# Patient Record
Sex: Female | Born: 1950
Health system: Southern US, Community
[De-identification: ages and names within clinical notes are randomized; demographics above are authoritative.]

## PROBLEM LIST (undated history)

## (undated) DIAGNOSIS — N289 Disorder of kidney and ureter, unspecified: Secondary | ICD-10-CM

## (undated) DIAGNOSIS — I2699 Other pulmonary embolism without acute cor pulmonale: Secondary | ICD-10-CM

## (undated) DIAGNOSIS — R7301 Impaired fasting glucose: Secondary | ICD-10-CM

## (undated) DIAGNOSIS — H269 Unspecified cataract: Secondary | ICD-10-CM

## (undated) DIAGNOSIS — M1990 Unspecified osteoarthritis, unspecified site: Secondary | ICD-10-CM

## (undated) DIAGNOSIS — E039 Hypothyroidism, unspecified: Secondary | ICD-10-CM

## (undated) DIAGNOSIS — I82409 Acute embolism and thrombosis of unspecified deep veins of unspecified lower extremity: Secondary | ICD-10-CM

## (undated) DIAGNOSIS — F419 Anxiety disorder, unspecified: Secondary | ICD-10-CM

## (undated) DIAGNOSIS — C099 Malignant neoplasm of tonsil, unspecified: Secondary | ICD-10-CM

## (undated) DIAGNOSIS — M7120 Synovial cyst of popliteal space [Baker], unspecified knee: Secondary | ICD-10-CM

## (undated) HISTORY — DX: Unspecified cataract: H26.9

## (undated) HISTORY — DX: Hypothyroidism, unspecified: E03.9

## (undated) HISTORY — PX: JOINT REPLACEMENT: SHX530

## (undated) HISTORY — DX: Impaired fasting glucose: R73.01

## (undated) HISTORY — PX: PULMONARY EMBOLISM SURGERY: SHX752

## (undated) HISTORY — PX: TOTAL ABDOMINAL HYSTERECTOMY: SHX209

## (undated) HISTORY — DX: Anxiety disorder, unspecified: F41.9

## (undated) HISTORY — DX: Synovial cyst of popliteal space (Baker), unspecified knee: M71.20

---

## 1996-11-11 DIAGNOSIS — C099 Malignant neoplasm of tonsil, unspecified: Secondary | ICD-10-CM

## 1996-11-11 HISTORY — DX: Malignant neoplasm of tonsil, unspecified: C09.9

## 1996-11-11 HISTORY — PX: TONSILLECTOMY AND ADENOIDECTOMY: SUR1326

## 1996-11-11 HISTORY — PX: LYMPHADENECTOMY: SHX15

## 1998-08-08 ENCOUNTER — Ambulatory Visit (HOSPITAL_COMMUNITY): Admission: RE | Admit: 1998-08-08 | Discharge: 1998-08-08 | Payer: Self-pay | Admitting: Obstetrics & Gynecology

## 1998-08-15 ENCOUNTER — Other Ambulatory Visit: Admission: RE | Admit: 1998-08-15 | Discharge: 1998-08-15 | Payer: Self-pay | Admitting: Obstetrics & Gynecology

## 1999-03-15 ENCOUNTER — Inpatient Hospital Stay (HOSPITAL_COMMUNITY): Admission: RE | Admit: 1999-03-15 | Discharge: 1999-03-17 | Payer: Self-pay | Admitting: Obstetrics & Gynecology

## 1999-08-13 ENCOUNTER — Ambulatory Visit (HOSPITAL_COMMUNITY): Admission: RE | Admit: 1999-08-13 | Discharge: 1999-08-13 | Payer: Self-pay | Admitting: Obstetrics & Gynecology

## 1999-08-13 ENCOUNTER — Encounter: Payer: Self-pay | Admitting: Obstetrics & Gynecology

## 1999-08-30 ENCOUNTER — Other Ambulatory Visit: Admission: RE | Admit: 1999-08-30 | Discharge: 1999-08-30 | Payer: Self-pay | Admitting: Obstetrics & Gynecology

## 1999-09-20 ENCOUNTER — Ambulatory Visit (HOSPITAL_COMMUNITY): Admission: RE | Admit: 1999-09-20 | Discharge: 1999-09-20 | Payer: Self-pay | Admitting: Family Medicine

## 1999-12-03 ENCOUNTER — Encounter: Payer: Self-pay | Admitting: *Deleted

## 1999-12-03 ENCOUNTER — Ambulatory Visit (HOSPITAL_COMMUNITY): Admission: RE | Admit: 1999-12-03 | Discharge: 1999-12-03 | Payer: Self-pay | Admitting: *Deleted

## 2000-08-19 ENCOUNTER — Encounter: Payer: Self-pay | Admitting: Obstetrics & Gynecology

## 2000-08-19 ENCOUNTER — Ambulatory Visit (HOSPITAL_COMMUNITY): Admission: RE | Admit: 2000-08-19 | Discharge: 2000-08-19 | Payer: Self-pay | Admitting: Obstetrics & Gynecology

## 2000-10-08 ENCOUNTER — Other Ambulatory Visit: Admission: RE | Admit: 2000-10-08 | Discharge: 2000-10-08 | Payer: Self-pay | Admitting: Obstetrics & Gynecology

## 2001-10-23 ENCOUNTER — Encounter: Payer: Self-pay | Admitting: Obstetrics & Gynecology

## 2001-10-23 ENCOUNTER — Ambulatory Visit (HOSPITAL_COMMUNITY): Admission: RE | Admit: 2001-10-23 | Discharge: 2001-10-23 | Payer: Self-pay | Admitting: Obstetrics & Gynecology

## 2001-11-11 HISTORY — PX: COLONOSCOPY: SHX174

## 2001-11-19 ENCOUNTER — Other Ambulatory Visit: Admission: RE | Admit: 2001-11-19 | Discharge: 2001-11-19 | Payer: Self-pay | Admitting: Obstetrics & Gynecology

## 2002-03-16 ENCOUNTER — Other Ambulatory Visit: Admission: RE | Admit: 2002-03-16 | Discharge: 2002-03-16 | Payer: Self-pay | Admitting: Obstetrics & Gynecology

## 2002-10-06 ENCOUNTER — Other Ambulatory Visit: Admission: RE | Admit: 2002-10-06 | Discharge: 2002-10-06 | Payer: Self-pay | Admitting: Obstetrics & Gynecology

## 2002-11-30 ENCOUNTER — Encounter: Payer: Self-pay | Admitting: Obstetrics & Gynecology

## 2002-11-30 ENCOUNTER — Ambulatory Visit (HOSPITAL_COMMUNITY): Admission: RE | Admit: 2002-11-30 | Discharge: 2002-11-30 | Payer: Self-pay | Admitting: Obstetrics & Gynecology

## 2003-02-03 ENCOUNTER — Encounter: Payer: Self-pay | Admitting: Emergency Medicine

## 2003-02-03 ENCOUNTER — Inpatient Hospital Stay (HOSPITAL_COMMUNITY): Admission: EM | Admit: 2003-02-03 | Discharge: 2003-02-04 | Payer: Self-pay | Admitting: Emergency Medicine

## 2003-02-04 ENCOUNTER — Encounter: Payer: Self-pay | Admitting: Cardiology

## 2003-10-21 ENCOUNTER — Other Ambulatory Visit: Admission: RE | Admit: 2003-10-21 | Discharge: 2003-10-21 | Payer: Self-pay | Admitting: Obstetrics & Gynecology

## 2003-12-05 ENCOUNTER — Ambulatory Visit (HOSPITAL_COMMUNITY): Admission: RE | Admit: 2003-12-05 | Discharge: 2003-12-05 | Payer: Self-pay | Admitting: Obstetrics & Gynecology

## 2004-01-18 ENCOUNTER — Ambulatory Visit (HOSPITAL_COMMUNITY): Admission: RE | Admit: 2004-01-18 | Discharge: 2004-01-18 | Payer: Self-pay | Admitting: Family Medicine

## 2004-11-21 ENCOUNTER — Other Ambulatory Visit: Admission: RE | Admit: 2004-11-21 | Discharge: 2004-11-21 | Payer: Self-pay | Admitting: Obstetrics & Gynecology

## 2004-12-21 ENCOUNTER — Ambulatory Visit (HOSPITAL_COMMUNITY): Admission: RE | Admit: 2004-12-21 | Discharge: 2004-12-21 | Payer: Self-pay | Admitting: Obstetrics & Gynecology

## 2005-01-10 ENCOUNTER — Ambulatory Visit: Payer: Self-pay | Admitting: Internal Medicine

## 2005-01-23 ENCOUNTER — Ambulatory Visit: Payer: Self-pay | Admitting: Internal Medicine

## 2005-11-11 HISTORY — PX: TOTAL HIP ARTHROPLASTY: SHX124

## 2005-12-23 ENCOUNTER — Ambulatory Visit (HOSPITAL_COMMUNITY): Admission: RE | Admit: 2005-12-23 | Discharge: 2005-12-23 | Payer: Self-pay | Admitting: Obstetrics & Gynecology

## 2005-12-25 ENCOUNTER — Other Ambulatory Visit: Admission: RE | Admit: 2005-12-25 | Discharge: 2005-12-25 | Payer: Self-pay | Admitting: Obstetrics & Gynecology

## 2006-07-21 ENCOUNTER — Encounter: Admission: RE | Admit: 2006-07-21 | Discharge: 2006-07-21 | Payer: Self-pay | Admitting: Family Medicine

## 2006-09-05 ENCOUNTER — Encounter: Admission: RE | Admit: 2006-09-05 | Discharge: 2006-09-05 | Payer: Self-pay | Admitting: Otolaryngology

## 2007-01-19 ENCOUNTER — Encounter: Admission: RE | Admit: 2007-01-19 | Discharge: 2007-01-19 | Payer: Self-pay | Admitting: Obstetrics & Gynecology

## 2007-01-20 ENCOUNTER — Ambulatory Visit: Payer: Self-pay | Admitting: Internal Medicine

## 2007-01-21 ENCOUNTER — Ambulatory Visit: Payer: Self-pay | Admitting: Internal Medicine

## 2007-02-02 ENCOUNTER — Ambulatory Visit: Payer: Self-pay

## 2007-02-23 ENCOUNTER — Inpatient Hospital Stay (HOSPITAL_COMMUNITY): Admission: RE | Admit: 2007-02-23 | Discharge: 2007-02-26 | Payer: Self-pay | Admitting: Orthopedic Surgery

## 2007-04-10 ENCOUNTER — Ambulatory Visit: Payer: Self-pay | Admitting: Internal Medicine

## 2007-06-03 ENCOUNTER — Encounter: Payer: Self-pay | Admitting: Internal Medicine

## 2007-06-03 ENCOUNTER — Inpatient Hospital Stay (HOSPITAL_COMMUNITY): Admission: RE | Admit: 2007-06-03 | Discharge: 2007-06-06 | Payer: Self-pay | Admitting: Orthopedic Surgery

## 2007-07-10 ENCOUNTER — Ambulatory Visit: Payer: Self-pay | Admitting: Internal Medicine

## 2007-07-13 LAB — CONVERTED CEMR LAB: TSH: 2.97 microintl units/mL (ref 0.35–5.50)

## 2007-07-14 ENCOUNTER — Encounter (INDEPENDENT_AMBULATORY_CARE_PROVIDER_SITE_OTHER): Payer: Self-pay | Admitting: *Deleted

## 2007-07-17 ENCOUNTER — Ambulatory Visit: Payer: Self-pay | Admitting: Internal Medicine

## 2007-07-17 DIAGNOSIS — E039 Hypothyroidism, unspecified: Secondary | ICD-10-CM | POA: Insufficient documentation

## 2007-07-29 ENCOUNTER — Encounter: Payer: Self-pay | Admitting: Internal Medicine

## 2007-08-05 ENCOUNTER — Encounter (INDEPENDENT_AMBULATORY_CARE_PROVIDER_SITE_OTHER): Payer: Self-pay | Admitting: *Deleted

## 2007-12-31 ENCOUNTER — Ambulatory Visit (HOSPITAL_COMMUNITY): Admission: RE | Admit: 2007-12-31 | Discharge: 2007-12-31 | Payer: Self-pay | Admitting: Obstetrics & Gynecology

## 2008-12-15 ENCOUNTER — Telehealth (INDEPENDENT_AMBULATORY_CARE_PROVIDER_SITE_OTHER): Payer: Self-pay | Admitting: *Deleted

## 2008-12-16 ENCOUNTER — Telehealth (INDEPENDENT_AMBULATORY_CARE_PROVIDER_SITE_OTHER): Payer: Self-pay | Admitting: *Deleted

## 2008-12-19 ENCOUNTER — Ambulatory Visit: Payer: Self-pay | Admitting: Internal Medicine

## 2008-12-24 LAB — CONVERTED CEMR LAB
ALT: 14 units/L (ref 0–35)
AST: 22 units/L (ref 0–37)
Alkaline Phosphatase: 62 units/L (ref 39–117)
Basophils Absolute: 0 10*3/uL (ref 0.0–0.1)
Bilirubin, Direct: 0.1 mg/dL (ref 0.0–0.3)
CO2: 31 meq/L (ref 19–32)
Chloride: 95 meq/L — ABNORMAL LOW (ref 96–112)
Cholesterol: 226 mg/dL (ref 0–200)
Glucose, Bld: 80 mg/dL (ref 70–99)
HDL: 85.1 mg/dL (ref >39.0)
Hemoglobin: 13.2 g/dL (ref 12.0–15.0)
Lymphocytes Relative: 22.8 % (ref 12.0–46.0)
MCHC: 34 g/dL (ref 30.0–36.0)
Monocytes Relative: 5.9 % (ref 3.0–12.0)
Neutrophils Relative %: 67.4 % (ref 43.0–77.0)
RBC: 4.27 M/uL (ref 3.87–5.11)
RDW: 12.6 % (ref 11.5–14.6)
Sodium: 136 meq/L (ref 135–145)
Total Bilirubin: 0.9 mg/dL (ref 0.3–1.2)
Total CHOL/HDL Ratio: 2.7
Total Protein: 6.9 g/dL (ref 6.0–8.3)
VLDL: 19 mg/dL (ref 0–40)

## 2008-12-26 ENCOUNTER — Telehealth (INDEPENDENT_AMBULATORY_CARE_PROVIDER_SITE_OTHER): Payer: Self-pay | Admitting: *Deleted

## 2008-12-27 ENCOUNTER — Ambulatory Visit: Payer: Self-pay | Admitting: Internal Medicine

## 2008-12-27 DIAGNOSIS — C090 Malignant neoplasm of tonsillar fossa: Secondary | ICD-10-CM | POA: Insufficient documentation

## 2008-12-27 LAB — CONVERTED CEMR LAB: OCCULT 3: NEGATIVE

## 2008-12-28 ENCOUNTER — Encounter: Payer: Self-pay | Admitting: Internal Medicine

## 2009-02-06 ENCOUNTER — Telehealth (INDEPENDENT_AMBULATORY_CARE_PROVIDER_SITE_OTHER): Payer: Self-pay | Admitting: *Deleted

## 2009-03-21 ENCOUNTER — Ambulatory Visit: Payer: Self-pay | Admitting: Internal Medicine

## 2009-03-22 LAB — CONVERTED CEMR LAB
Rhuematoid fact SerPl-aCnc: <20 intl units/mL (ref 0.0–20.0)
Sed Rate: 20 mm/hr (ref 0–22)
TSH: 2.16 microintl units/mL (ref 0.35–5.50)
Uric Acid, Serum: 5.2 mg/dL (ref 2.4–7.0)

## 2009-03-23 ENCOUNTER — Encounter (INDEPENDENT_AMBULATORY_CARE_PROVIDER_SITE_OTHER): Payer: Self-pay | Admitting: *Deleted

## 2009-09-25 ENCOUNTER — Telehealth (INDEPENDENT_AMBULATORY_CARE_PROVIDER_SITE_OTHER): Payer: Self-pay | Admitting: *Deleted

## 2009-12-12 ENCOUNTER — Telehealth (INDEPENDENT_AMBULATORY_CARE_PROVIDER_SITE_OTHER): Payer: Self-pay | Admitting: *Deleted

## 2009-12-22 ENCOUNTER — Telehealth (INDEPENDENT_AMBULATORY_CARE_PROVIDER_SITE_OTHER): Payer: Self-pay | Admitting: *Deleted

## 2009-12-25 ENCOUNTER — Ambulatory Visit: Payer: Self-pay | Admitting: Internal Medicine

## 2010-01-01 ENCOUNTER — Ambulatory Visit: Payer: Self-pay | Admitting: Internal Medicine

## 2010-01-01 DIAGNOSIS — I447 Left bundle-branch block, unspecified: Secondary | ICD-10-CM | POA: Insufficient documentation

## 2010-01-01 DIAGNOSIS — E785 Hyperlipidemia, unspecified: Secondary | ICD-10-CM

## 2010-01-01 DIAGNOSIS — E782 Mixed hyperlipidemia: Secondary | ICD-10-CM | POA: Insufficient documentation

## 2010-01-01 DIAGNOSIS — M1990 Unspecified osteoarthritis, unspecified site: Secondary | ICD-10-CM | POA: Insufficient documentation

## 2010-01-01 LAB — CONVERTED CEMR LAB
AST: 17 units/L (ref 0–37)
Alkaline Phosphatase: 66 units/L (ref 39–117)
BUN: 15 mg/dL (ref 6–23)
Basophils Absolute: 0 10*3/uL (ref 0.0–0.1)
Bilirubin, Direct: 0.1 mg/dL (ref 0.0–0.3)
Cholesterol: 216 mg/dL — ABNORMAL HIGH (ref 0–200)
Creatinine, Ser: 1.1 mg/dL (ref 0.4–1.2)
Direct LDL: 123.7 mg/dL
Eosinophils Absolute: 0.2 10*3/uL (ref 0.0–0.7)
GFR calc non Af Amer: 54.31 mL/min (ref >60)
Hemoglobin: 13.2 g/dL (ref 12.0–15.0)
Lymphocytes Relative: 27.3 % (ref 12.0–46.0)
Lymphs Abs: 1.2 10*3/uL (ref 0.7–4.0)
MCHC: 33.3 g/dL (ref 30.0–36.0)
Neutro Abs: 2.6 10*3/uL (ref 1.4–7.7)
Platelets: 243 10*3/uL (ref 150.0–400.0)
Potassium: 4.1 meq/L (ref 3.5–5.1)
RDW: 12.9 % (ref 11.5–14.6)
TSH: 4.87 microintl units/mL (ref 0.35–5.50)
Total Protein: 6.5 g/dL (ref 6.0–8.3)
VLDL: 23.4 mg/dL (ref 0.0–40.0)

## 2010-01-15 ENCOUNTER — Ambulatory Visit (HOSPITAL_COMMUNITY): Admission: RE | Admit: 2010-01-15 | Discharge: 2010-01-15 | Payer: Self-pay | Admitting: Obstetrics & Gynecology

## 2010-06-04 ENCOUNTER — Ambulatory Visit: Payer: Self-pay | Admitting: Internal Medicine

## 2010-06-04 DIAGNOSIS — M7120 Synovial cyst of popliteal space [Baker], unspecified knee: Secondary | ICD-10-CM | POA: Insufficient documentation

## 2010-12-01 ENCOUNTER — Encounter: Payer: Self-pay | Admitting: Obstetrics & Gynecology

## 2010-12-02 ENCOUNTER — Encounter: Payer: Self-pay | Admitting: Obstetrics & Gynecology

## 2010-12-09 LAB — CONVERTED CEMR LAB
Free T4: 0.5 ng/dL — ABNORMAL LOW (ref 0.6–1.6)
T3, Free: 2.5 pg/mL (ref 2.3–4.2)

## 2010-12-13 NOTE — Assessment & Plan Note (Signed)
Summary: lump behind knee/cbs   Vital Signs:  Patient profile:   60 year old female Weight:      150.6 pounds Temp:     98.5 degrees F oral Pulse rate:   60 / minute Resp:     15 per minute BP sitting:   104 / 68  (left arm) Cuff size:   large  Vitals Entered By: Shonna Chock CMA (June 04, 2010 1:58 PM) CC: 1.) Lump behind left knee, patient with history of blood clot  2.) TSH check, Lower Extremity Joint pain   CC:  1.) Lump behind left knee, patient with history of blood clot  2.) TSH check, and Lower Extremity Joint pain.  History of Present Illness: Knot behind L knee noted 07/24 /2011 as "tightness".  The patient denies swelling, redness, giving away, locking, popping, stiffness for >1 hr, decreased ROM, or  weakness.  The pain is located in the L posterior knee.  The pain began gradually and with no injury. No evaluation to date. The patient denies the following symptoms: fever, rash, photosensitivity, eye symptoms, diarrhea, and dysuria.  PMH of DVT.  Allergies (verified): No Known Drug Allergies  Physical Exam  General:  well-nourished,in no acute distress; alert,appropriate and cooperative throughout examination Pulses:  R and L dorsalis pedis and posterior tibial pulses are full and equal bilaterally Extremities:  No clubbing, cyanosis, edema, or deformity noted with normal full range of motion of all joints. L Popliteal cyst  . Negative Homan's Neurologic:  strength normal in all extremities and DTRs symmetrical and normal.   Skin:  Intact without suspicious lesions or rashes   Impression & Recommendations:  Problem # 1:  POPLITEAL CYST, LEFT (ICD-727.51)  Complete Medication List: 1)  Levothyroxine Sodium 50 Mcg Tabs (Levothyroxine sodium) .[name].. 1& 1/2  m,w ,f & sun ; 1 pill t,th , & sat 2)  Estrace 2 Mg Tabs (Estradiol) .[name].. 1 by mouth once daily 3)  Effexor Xr 75 Mg Xr24h-cap (Venlafaxine hcl) .[name].. 1 by mouth once daily 4)  Pilocarpine Hcl 5 Mg Tabs (Pilocarpine  hcl) .[name].. 1 by mouth 4 x once daily as needed 5)  Vitamin D 2000 Unit Tabs (Cholecalciferol) .[name].. 1 by mouth once daily 6)  Tretinoin 0.05 % Crea (Tretinoin) .... As directed 7)  Celebrex 200 Mg Caps (Celecoxib) .[name].. 1 two times a day as needed for apin & swelling  Patient Instructions: 1)  Go to [facility]  for Popliteal  (Baker's ) Cyst. Prescriptions: CELEBREX 200 MG CAPS (CELECOXIB) 1 two times a day as needed for apin & swelling  #15 x 0   Entered and Authorized by:   Marga Melnick MD   Signed by:   Marga Melnick MD on 06/04/2010   Method used:   Samples Given   RxID:   [id number]   Appended Document: lump behind knee/cbs   Appended Document: lump behind knee/cbs

## 2010-12-13 NOTE — Progress Notes (Signed)
Summary: cpx labs  Phone Note Call from Patient   Caller: Patient Summary of Call: Patient has a cpx on 2/21 and labs on the 14th. Need cpx lab orders please. Initial call taken by: Barb Merino,  December 12, 2009 10:35 AM  Follow-up for Phone Call        lipid,hep,cbcd,tsh,bmp,stool cards & U/A if not done by GYN v70.0/244.9 Follow-up by: Shonna Chock,  December 12, 2009 2:39 PM    Additional Follow-up for Phone Call Additional follow up Details #2::    labs are scheduled.Marland KitchenMarland KitchenMarland KitchenBarb Merino  December 13, 2009 10:39 AM  Follow-up by: Barb Merino,  December 13, 2009 10:39 AM

## 2010-12-13 NOTE — Progress Notes (Signed)
Summary: -Need Verification  Phone Note Call from Patient Call back at Home Phone [phone]   Caller: Patient Call For: Marga Melnick MD Summary of Call: Patient is scheduled for bloodwork on Monday and needs to know if she needs to take her Thyroid medication that morning. Initial call taken by: Barnie Mort,  December 22, 2009 2:28 PM  Follow-up for Phone Call        left message to call office. ...............Marland KitchenFelecia Deloach CMA  December 22, 2009 2:42 PM   pt can take all meds and have only black coffee and water for fasting appt..............Marland KitchenFelecia Deloach CMA  December 22, 2009 2:42 PM    pt aware....................Marland KitchenFelecia Deloach CMA  December 22, 2009 4:22 PM

## 2010-12-13 NOTE — Assessment & Plan Note (Signed)
Summary: yearly//fd   Vital Signs:  Patient profile:   60 year old female Height:      69.75 inches Weight:      154 pounds BMI:     22.34 Temp:     98.0 degrees F oral Pulse rate:   79 / minute Resp:     14 per minute BP sitting:   132 / 85  (left arm) Cuff size:   large  Vitals Entered By: Shonna Chock (January 01, 2010 11:32 AM)  Comments REVIEWED MED LIST, PATIENT AGREED DOSE AND INSTRUCTION CORRECT    History of Present Illness: Danielle Pratt is here for a physical; she is asymptomatic.  Allergies (verified): No Known Drug Allergies  Past History:  Past Medical History: tonsillar malignancy  1998 S/P irradiation, Dr Arletha Grippe Hypothyroidism ; LDL 113 with 1189 total particles & 0 small dense particles (LDL goal = < 130) Osteoarthritis  Past Surgical History: Hysterectomy & BSO for dysmenorrhea & fibroids  2000, Dr Konrad Dolores  C-section with twins ; G1 P2 One tonsil removed 1997 bilateral hip replacement 2008, Dr Despina Hick Colonoscopy negative  2006, due 2016  Family History: Mother both hips replaced, ovarian CA PGM - leukemia; PGF lung CA Father - MI @ 77,S/P stent brother and sister - celiac disease  Social History: Drinks socially  Former Smoker: quit age 25 Alcohol use-yes Regular exercise-no  Review of Systems  The patient denies anorexia, fever, vision loss, decreased hearing, hoarseness, chest pain, syncope, dyspnea on exertion, peripheral edema, prolonged cough, headaches, hemoptysis, abdominal pain, melena, hematochezia, severe indigestion/heartburn, hematuria, suspicious skin lesions, depression, unusual weight change, abnormal bleeding, enlarged lymph nodes, and angioedema.         Minimal weight gain. No cold or cold intolerance. No change in hair, skin or nails.  Physical Exam  General:  Thin but well-nourished; alert,appropriate and cooperative throughout examination Head:  Normocephalic and atraumatic without obvious abnormalities. No  thinning  of hair Eyes:  No corneal or conjunctival inflammation noted. Perrla. Funduscopic exam benign, without hemorrhages, exudates or papilledema.  Ears:  External ear exam shows no significant lesions or deformities.  Otoscopic examination reveals clear canals, tympanic membranes are intact bilaterally without bulging, retraction, inflammation or discharge. Hearing is grossly normal bilaterally. Nose:  External nasal examination shows no deformity or inflammation. Nasal mucosa are pink and moist without lesions or exudates. Mouth:  Oral mucosa and oropharynx without lesions or exudates.  Teeth in good repair. L post pharynx is lower than R; no tissue changes Neck:  No deformities, masses, or tenderness noted. Lungs:  Normal respiratory effort, chest expands symmetrically. Lungs are clear to auscultation, no crackles or wheezes. Heart:  Normal rate and regular rhythm. S1 and S2 normal without gallop, murmur, click, rub. S4 with slurring Abdomen:  Bowel sounds positive,abdomen soft and non-tender without masses, organomegaly or hernias noted. Msk:  No deformity or scoliosis noted of thoracic or lumbar spine.   Pulses:  R and L carotid,radial,dorsalis pedis and posterior tibial pulses are full and equal bilaterally Extremities:  No clubbing, cyanosis, edema. Minor OA DIP changes. Minor crepitus of knees Neurologic:  alert & oriented X3 and DTRs  WNL but 0-1/2 + R knee Skin:  Intact without suspicious lesions or rashes Cervical Nodes:  No lymphadenopathy noted Axillary Nodes:  No palpable lymphadenopathy Psych:  memory intact for recent and remote, normally interactive, and good eye contact.     Impression & Recommendations:  Problem # 1:  ROUTINE GENERAL MEDICAL EXAM@HEALTH   CARE FACL (ICD-V70.0)  Orders: EKG w/ Interpretation (93000)  Problem # 2:  HYPERLIPIDEMIA (ICD-272.4) LDL @ goal; HDL protective  Problem # 3:  LEFT BUNDLE BRANCH BLOCK (ICD-426.3)  Stable (see 01/20/2007)  Orders: EKG  w/ Interpretation (93000)  Problem # 4:  HYPOTHYROIDISM (ICD-244.9)  Her updated medication list for this problem includes:    Levothyroxine Sodium 50 Mcg Tabs (Levothyroxine sodium) .[name]... 1& 1/2  m,w ,f & sun ; 1 pill t,th , & sat  Problem # 5:  OSTEOARTHRITIS (ICD-715.90)  Complete Medication List: 1)  Levothyroxine Sodium 50 Mcg Tabs (Levothyroxine sodium) .[name].. 1& 1/2  m,w ,f & sun ; 1 pill t,th , & sat 2)  Estrace 2 Mg Tabs (Estradiol) .[name].. 1 by mouth once daily 3)  Effexor Xr 75 Mg Xr24h-cap (Venlafaxine hcl) .[name].. 1 by mouth once daily 4)  Pilocarpine Hcl 5 Mg Tabs (Pilocarpine hcl) .[name].. 1 by mouth 4 x once daily as needed 5)  Vitamin D 2000 Unit Tabs (Cholecalciferol) .[name].. 1 by mouth once daily 6)  Tretinoin 0.05 % Crea (Tretinoin) .... As directed  Patient Instructions: 1)  Please schedule a follow-up lab  appointment in 4 months.( QIH,474.2) Prescriptions: TRETINOIN 0.05 % CREA (TRETINOIN) as directed  #20 grams x 5   Entered and Authorized by:   Marga Melnick MD   Signed by:   Marga Melnick MD on 01/01/2010   Method used:   Faxed to ...       [facility] [phone]* (retail)       [address]       [place], [place]  [address]       Ph: [phone] or [phone]       Fax: [phone]   RxID:   [id number] EFFEXOR XR 75 MG XR24H-CAP (VENLAFAXINE HCL) 1 by mouth once daily  #30 Capsule x 11   Entered and Authorized by:   Marga Melnick MD   Signed by:   Marga Melnick MD on 01/01/2010   Method used:   Faxed to ...       [facility] [phone]* (retail)       [address]       [place], [place]  [address]       Ph: [phone] or [phone]       Fax: [phone]   RxID:   [id number] LEVOTHYROXINE SODIUM 50 MCG TABS (LEVOTHYROXINE SODIUM) 1& 1/2  M,W ,F & Sun ; 1 pill T,Th , & Sat  #90 x 4   Entered and Authorized by:   Marga Melnick MD   Signed by:   Marga Melnick MD on 01/01/2010   Method used:   Faxed to ...       [facility] [address] (retail)        [address]       [place], [place]  [address]       Ph: [phone] or [phone]       Fax: [phone]   RxID:   [id number]

## 2010-12-21 ENCOUNTER — Emergency Department (HOSPITAL_COMMUNITY)
Admission: EM | Admit: 2010-12-21 | Discharge: 2010-12-21 | Disposition: A | Discharge status: Home / Self Care | Payer: BC Managed Care – PPO | Attending: Emergency Medicine | Admitting: Emergency Medicine

## 2010-12-21 DIAGNOSIS — W260XXA Contact with knife, initial encounter: Secondary | ICD-10-CM | POA: Insufficient documentation

## 2010-12-21 DIAGNOSIS — E039 Hypothyroidism, unspecified: Secondary | ICD-10-CM | POA: Insufficient documentation

## 2010-12-21 DIAGNOSIS — S61209A Unspecified open wound of unspecified finger without damage to nail, initial encounter: Secondary | ICD-10-CM | POA: Insufficient documentation

## 2010-12-21 DIAGNOSIS — W261XXA Contact with sword or dagger, initial encounter: Secondary | ICD-10-CM | POA: Insufficient documentation

## 2011-01-01 ENCOUNTER — Ambulatory Visit (INDEPENDENT_AMBULATORY_CARE_PROVIDER_SITE_OTHER): Payer: BC Managed Care – PPO | Admitting: Internal Medicine

## 2011-01-01 ENCOUNTER — Encounter: Payer: Self-pay | Admitting: Internal Medicine

## 2011-01-01 DIAGNOSIS — S61209A Unspecified open wound of unspecified finger without damage to nail, initial encounter: Secondary | ICD-10-CM

## 2011-01-08 NOTE — Assessment & Plan Note (Signed)
Summary: stitch removal/ok per chrae/kn   Vital Signs:  Patient profile:   60 year old female Weight:      156.8 pounds BMI:     22.74 Pulse rate:   80 / minute Resp:     14 per minute BP sitting:   122 / 70  (left arm) Cuff size:   large  Vitals Entered By: Shonna Chock CMA (January 01, 2011 59:38 PM) CC: Stitch removal: patient injured thumb on left finge Friday a week ago. Patient here to have thumb examined and see if ok to remove stitched Comments Observation of Thumb: Drainage still present (small amount), finger/stitches dont appear to be ready for removal. MD to exmaine    CC:  Stitch removal: patient injured thumb on left finge Friday a week ago. Patient here to have thumb examined and see if ok to remove stitched.  History of Present Illness:      This is a 60 year old woman who presents  following cutting her L thumb with a ceramic  knife 12/21/2010. Sutures were placed that day & tetanus shot given.  She now  reports tenderness and numbness @ the thunb tip.  The patient denies increased warmth , fever , chills signs of cellulitis  or  purulence.  She hit the thumb yesterday with slight bleeding.  Allergies (verified): No Known Drug Allergies  Physical Exam  Skin:  Ecchymoses circumferentially @ suture line  @ thumb  tip ; no  cellulitis or purulence. Seven sutures removed Axillary Nodes:  No palpable lymphadenopathy LUE   Impression & Recommendations:  Problem # 1:  LACERATION, LEFT THUMB (ICD-883.0) w/o cellulitis  Complete Medication List: 1)  Levothyroxine Sodium 50 Mcg Tabs (Levothyroxine sodium) .[name].. 1& 1/2  m,w ,f & sun ; 1 pill t,th , & sat 2)  Estrace 2 Mg Tabs (Estradiol) .[name].. 1 by mouth once daily 3)  Effexor Xr 75 Mg Xr24h-cap (Venlafaxine hcl) .[name].. 1 by mouth once daily 4)  Pilocarpine Hcl 5 Mg Tabs (Pilocarpine hcl) .[name].. 1 by mouth 4 x once daily as needed 5)  Vitamin D 2000 Unit Tabs (Cholecalciferol) .[name].. 1 by mouth once daily 6)  Tretinoin 0.05 %  Crea (Tretinoin) .... As directed 7)  Celebrex 200 Mg Caps (Celecoxib) .[name].. 1 two times a day as needed for apin & swelling 8)  Amoxicillin-pot Clavulanate 500-125 Mg Tabs (Amoxicillin-pot clavulanate) .[name].. 1 every 12 hrs with a meal 9)  Bactroban 2 % Crea (Mupirocin calcium) .... Apply once daily after cleaning  Patient Instructions: 1)  Report Warning Signs "pain , pus , fever  & red streaks ". Telfa dressing  ; keep wound dry Prescriptions: BACTROBAN 2 % CREA (MUPIROCIN CALCIUM) apply once daily after cleaning  #15 grams x 0   Entered and Authorized by:   Marga Melnick MD   Signed by:   Marga Melnick MD on 01/01/2011   Method used:   Print then Give to Patient   RxID:   [id number] AMOXICILLIN-POT CLAVULANATE 500-125 MG TABS (AMOXICILLIN-POT CLAVULANATE) 1 every 12 hrs with a meal  #14 x 0   Entered and Authorized by:   Marga Melnick MD   Signed by:   Marga Melnick MD on 01/01/2011   Method used:   Electronically to        [facility] [phone]* (retail)       [address]       [place], [place]  [address]  Ph: [phone] or [phone]       Fax: [phone]   RxID:   [id number]    Orders Added: 1)  Est. Patient Level II [51884]

## 2011-01-14 ENCOUNTER — Emergency Department (HOSPITAL_COMMUNITY): Payer: BC Managed Care – PPO

## 2011-01-14 ENCOUNTER — Observation Stay (HOSPITAL_COMMUNITY)
Admission: EM | Admit: 2011-01-14 | Discharge: 2011-01-15 | Disposition: A | Discharge status: Home / Self Care | Payer: BC Managed Care – PPO | Attending: Emergency Medicine | Admitting: Emergency Medicine

## 2011-01-14 DIAGNOSIS — X58XXXA Exposure to other specified factors, initial encounter: Secondary | ICD-10-CM | POA: Insufficient documentation

## 2011-01-14 DIAGNOSIS — Z96649 Presence of unspecified artificial hip joint: Secondary | ICD-10-CM | POA: Insufficient documentation

## 2011-01-14 DIAGNOSIS — Z79899 Other long term (current) drug therapy: Secondary | ICD-10-CM | POA: Insufficient documentation

## 2011-01-14 DIAGNOSIS — T84029A Dislocation of unspecified internal joint prosthesis, initial encounter: Principal | ICD-10-CM | POA: Insufficient documentation

## 2011-01-14 DIAGNOSIS — E039 Hypothyroidism, unspecified: Secondary | ICD-10-CM | POA: Insufficient documentation

## 2011-01-15 ENCOUNTER — Emergency Department (HOSPITAL_COMMUNITY): Payer: BC Managed Care – PPO

## 2011-01-15 LAB — CBC
MCHC: 32.4 g/dL (ref 30.0–36.0)
Platelets: 222 10*3/uL (ref 150–400)
RDW: 13.2 % (ref 11.5–15.5)

## 2011-01-15 LAB — DIFFERENTIAL
Basophils Absolute: 0 10*3/uL (ref 0.0–0.1)
Basophils Relative: 0 % (ref 0–1)
Eosinophils Relative: 0 % (ref 0–5)
Monocytes Absolute: 0.4 10*3/uL (ref 0.1–1.0)

## 2011-01-15 LAB — BASIC METABOLIC PANEL
Calcium: 8.8 mg/dL (ref 8.4–10.5)
GFR calc Af Amer: >60 mL/min (ref >60)
GFR calc non Af Amer: 58 mL/min — ABNORMAL LOW (ref >60)
Sodium: 138 mEq/L (ref 135–145)

## 2011-01-18 ENCOUNTER — Other Ambulatory Visit (HOSPITAL_COMMUNITY): Payer: Self-pay | Admitting: Obstetrics & Gynecology

## 2011-01-18 DIAGNOSIS — Z1231 Encounter for screening mammogram for malignant neoplasm of breast: Secondary | ICD-10-CM

## 2011-01-18 NOTE — Op Note (Signed)
  NAMEANYAH, SWALLOW NO.:  [id number]  MEDICAL RECORD NO.:  [id number]           PATIENT TYPE:  E  LOCATION:  WLED                         FACILITY:  [facility]  PHYSICIAN:  Jene Every, M.D.    DATE OF BIRTH:  01-15-1951  DATE OF PROCEDURE: DATE OF DISCHARGE:                              OPERATIVE REPORT   PREOPERATIVE DIAGNOSIS:  Dislocated right total hip replacement.  POSTOPERATIVE DIAGNOSIS:  Dislocated right total hip replacement.  PROCEDURE PERFORMED:  Closed reduction under anesthesia of the right hip followed by examination under anesthesia.  SURGEON:  Jene Every, M.D.  ANESTHESIA:  General.  BRIEF HISTORY:  This is a 60 year old female status post total hip replacement by Dr. Lequita Halt, had been doing well, was on the floor, playing with her grandchildren, was arising and then apparently dislocated her hip.  Presented to the Emergency Room with pain, shortening of the extremity, internal rotation with an x-ray indicating posterior and superior dislocation of the total hip replacement.  She was indicated for closed reduction under anesthesia.  Risk and benefits discussed including inability to reduce need for revision, etc.  Placed in supine position after induction of adequate general anesthesia. Performed a reduction maneuver over the hip in 90 degrees of flexion, longitudinal traction and internal rotation.  Reduction was medial and without difficulty.  Following this, examination revealed leg lengths were restored.  She was stable during the post reduction examination to flexion of 9 degrees combined with internal rotation of 25, full external rotation, full extension combined with internal rotation. Pulses were intact.  X-rays in the far lateral demonstrated concentric location of the hip.  No fracture noted of the femur or the acetabulum. There was no disassociation of the components.  The patient again was then placed in a knee  immobilizer, extubated without difficulty and transported to the recovery room in satisfactory condition.  The patient tolerated the procedure with no complications.     Jene Every, M.D.     Cordelia Pen  D:  01/15/2011  T:  01/15/2011  Job:  [id number]  cc:   Ollen Gross, M.D. Fax: [phone]  Electronically Signed by Jene Every M.D. on 01/18/2011 05:50:22 AM

## 2011-02-06 ENCOUNTER — Ambulatory Visit (HOSPITAL_COMMUNITY): Payer: BC Managed Care – PPO

## 2011-02-08 ENCOUNTER — Ambulatory Visit (HOSPITAL_COMMUNITY)
Admission: RE | Admit: 2011-02-08 | Discharge: 2011-02-08 | Disposition: A | Discharge status: Home / Self Care | Payer: BC Managed Care – PPO | Source: Ambulatory Visit | Attending: Obstetrics & Gynecology | Admitting: Obstetrics & Gynecology

## 2011-02-08 DIAGNOSIS — Z1231 Encounter for screening mammogram for malignant neoplasm of breast: Secondary | ICD-10-CM | POA: Insufficient documentation

## 2011-02-11 ENCOUNTER — Ambulatory Visit (HOSPITAL_COMMUNITY): Payer: BC Managed Care – PPO

## 2011-03-26 NOTE — H&P (Signed)
Danielle Pratt, DEMETRIUS NO.:  [id number]   MEDICAL RECORD NO.:  [id number]          PATIENT TYPE:  INP   LOCATION:  1614                         FACILITY:  [facility]   PHYSICIAN:  Ollen Gross, M.D.    DATE OF BIRTH:  1951-03-15   DATE OF ADMISSION:  06/03/2007  DATE OF DISCHARGE:                              HISTORY & PHYSICAL   CHIEF COMPLAINT:  Right hip pain.   HISTORY OF PRESENT ILLNESS:  The patient is a 60 year old female, well-  known to Dr. Ollen Gross, having previously undergone a left total hip  back in April 2008.  She has done well with her left hip but has  continued pain in the right side.  It is felt she would benefit  undergoing surgery, subsequently admitted to the hospital.   ALLERGIES:  NO KNOWN DRUG ALLERGIES.   CURRENT MEDICATIONS:  Pilocarpine, Synthroid, estradiol, Effexor,  Darvocet, methocarbamol, Bioflex, multivitamin, baby aspirin.   PAST MEDICAL HISTORY:  Anxiety, hypothyroidism, history of superficial  vein thrombosis, history of tonsillar cancer.   PAST SURGICAL HISTORY:  Cesarean section, hysterectomy, right tonsil  removed, with also lymph node resection.   SOCIAL HISTORY:  Married, works Environmental manager, nonsmoker.  Two  to three drinks of alcohol per week.  Two children.   FAMILY HISTORY:  Father with history of heart disease.  Mother with  history of cervical cancer.   REVIEW OF SYSTEMS:  GENERAL:  No fevers, chills or night sweats.  NEURO:  No seizures, syncope or paralysis.  RESPIRATORY:  No shortness of  breath, productive cough or hemoptysis.  CARDIOVASCULAR:  No chest pain,  angina or orthopnea.  GI:  No nausea, vomiting, diarrhea or  constipation.  GU:  No dysuria, hematuria or discharge.  MUSCULOSKELETAL:  Right hip.   PHYSICAL:  VITAL SIGNS: Pulse 78, respirations 14, blood pressure  128/70.  GENERAL:  A 60 year old white female well-nourished, well-developed,  tall, thin frame, no acute distress.  She  is alert and oriented,  cooperative, excellent historian.  HEENT:  Normocephalic, atraumatic.  Pupils round and reactive.  Oropharynx clear.  EOMs intact.  Know to wear glasses.  NECK:  Supple.  CHEST:  Clear, anterior posterior chest with no rhonchi, rales or  wheezes.  HEART:  Regular rate and rhythm.  No murmur, S1-S2 noted.  ABDOMEN:  Soft, flat and nontender.  Bowel sounds present.  RECTAL, BREAST, GENITALIA:  Not done, not per present illness.  EXTREMITIES:  Right hip:  Limited motion, flexion of 90, 0 internal  rotation, 20 degrees external rotation, 20 degrees abduction.   IMPRESSION:  1. Osteoarthritis right hip.  2. Anxiety.  3. Hypothyroidism.  4. History of superficial vein thrombosis.  5. History of tonsillar cancer status post resection and radiation.  6. Left bundle branch block per EKG.   PLAN:  The patient was admitted to [facility] to undergo a  right total replacement arthroplasty.  Surgery will be performed by  Ollen Gross.      Alexzandrew L. Perkins, P.A.C.      Ollen Gross, M.D.  Electronically Signed  ALP/MEDQ  D:  06/04/2007  T:  06/05/2007  Job:  [id number]   cc:   Titus Dubin. Alwyn Ren, MD,FACP,FCCP  [phone]  [place] [address]

## 2011-03-26 NOTE — Op Note (Signed)
Danielle Pratt NO.:  [id number]   MEDICAL RECORD NO.:  [id number]          PATIENT TYPE:  INP   LOCATION:  0002                         [facility]ACILITY:  [facility]   PHYSICIAN:  Ollen Gross, M.D.    DATE OF BIRTH:  [birth year]   DATE OF PROCEDURE:  06/03/2007  DATE OF DISCHARGE:                               OPERATIVE REPORT   PREOPERATIVE DIAGNOSIS:  Osteoarthritis right hip.   POSTOPERATIVE DIAGNOSIS:  Osteoarthritis right hip.   PROCEDURE:  Right total hip arthroplasty.   SURGEON:  Ollen Gross, M.D.   ASSISTANT:  Avel Peace PA-C   ANESTHESIA:  General.   ESTIMATED BLOOD LOSS:  200.   DRAIN:  None.   COMPLICATIONS:  None.   CONDITION:  Stable to recovery.   BRIEF CLINICAL NOTE:  Danielle Pratt is a 60 year old female with end-stage  arthritis of the right hip with progressively worsening pain and  dysfunction.  She had a recent successful left total hip arthroplasty  and presents now for right total hip arthroplasty.   PROCEDURE IN DETAIL:  After successful administration of general  anesthetic, the patient was placed in a left lateral decubitus position  with right side up and held with the hip positioner.  Right lower  extremity was isolated from perineum with plastic drapes and prepped and  draped in the usual sterile fashion.  Short posterolateral incision is  made with a 10 blade through subcutaneous tissue to the level of fascia  lata which was incised in line with the skin incision.  Sciatic nerve  was palpated and protected and short external rotators isolated off the  femur.  Capsulectomy is performed and the hip was dislocated.  The  center of femoral head is marked and the trial prosthesis placed such  that the center of the trial head corresponds to the center of her  native femoral head.  Osteotomy lines marked on the femoral neck and  osteotomy made with an oscillating saw.  Femoral head removed and the  femur retracted anteriorly to gain  acetabular exposure.   Acetabular retractors were placed and labrum and osteophytes removed.  Reaming starts at 43 mm coursing in increments of 2 up to 51 mm and a 52  mm Pinnacle acetabular shell was placed in anatomic position and  transfixed with two dome screws.  The apex hole eliminator is placed and  a 36 mm neutral Ultamet metal liner was placed.   Femur was prepared the canal finder and irrigation.  Axial reaming is  performed up to 13.5 mm, proximal reaming to 18 D and sleeve machined to  a small.  18 D small trial sleeve is placed with 18 x 13 stem and 36 +8  neck.  Placed a 36 +0 head.  The hip was reduced and was fairly easy, so  went to 36 +3 which had more appropriate tension.  That stability was  outstanding with full extension, full external rotation, 70 degrees  flexion, 40 degrees adduction, 90 degrees internal rotation, 90 degrees  of flexion and 70 degrees of internal rotation.  By placing the right  leg  on top of the left it felt as though leg lengths were equal.   The trials removed and the permanent 18 D small sleeve is placed and 18  x 13 stem and 36 +8 neck matching her native anteversion.  The 36 +3  head is placed and the hip was reduced with the same stability  parameters.  The wound was copiously irrigated with saline solution and  the short rotators reattached to the femur through drill holes.  Fascia  lata is closed over Hemovac drain with interrupted #1 Vicryl, subcu  closed with number 1-0 and 2-0 Vicryl and subcuticular running 4-0  Monocryl.  The incision is cleaned and dried and Steri-Strips and a  bulky sterile dressing applied.  She is then placed into a knee  immobilizer, awakened and transferred to recovery in stable condition.      Ollen Gross, M.D.  Electronically Signed     FA/MEDQ  D:  06/03/2007  T:  06/03/2007  Job:  [id number]

## 2011-03-29 NOTE — Letter (Signed)
January 31, 2007    Sibyl Parr, M.D.   RE:  AUDRE, CENCI  MRN:  [id number]  /  DOB:  May 30, 1951   Dear Dr. Despina Hick:   I have known Samara Deist for almost 3 decades, but had not seen her as a  patient until March 11.  She states that she is to have total hip  replacement on April 14 for severe degenerative changes.  She is a very  stoic individual & although she has insomnia due to the pain, she  declined pain medications.  The pain has been present for over a year  and progressive, now constant only partially  relieved by Aleve 1 to 2  daily.   PAST MEDICAL HISTORY:  Includes hysterectomy and bilateral salpingo-  oophorectomy, for dysmenorrhea.  She had 1 pregnancy with 2 twin boys.  In 1997, the right tonsil was removed because of malignant change with  lymph node resection followed by radiation.  Colonoscopy in 2006 was  negative.  She was hospitalized in 2004 for chest pain but workup did  not reveal active ischemic disease.   Her mother also had both hips replaced; she also had ovarian cancer.  Maternal grandfather had leukemia.  Father had a stent at age 50.  Brother and sister both have celiac disease.   She smoked in high school.  She drinks socially.   1. She is on Osteo Bi-Flex.  2. Multivitamins.  3. Estradiol.  4. Iso Carpine 3 to 4 times daily as needed.  5. Effexor XR 75 mg daily.   She has no known drug allergies.   The remainder of her review of systems was negative.   The routine lipid profile had demonstrated an elevated LDL but the NMR  LipoProfile does not indicate significant cardiac risk.  Her EKG  revealed left axis deviation and left bundle branch block, which was not  present in 2003.  A rest-stress test will be completed March 24 to allow  preoperative clearance.   Her TSH suggested hypothyroidism and full thyroid function tests will be  pursued.  Specifically, her TSH was 15.3.  Thyroid supplementation will  be recommended to provide a  TSH in the range of 1-3.  Excessive thyroid  administration must be avoided, as this could accelerate osteoporosis.   I appreciate your expertise in her care; she is a very special lady to  my wife, Clydie Braun and me.    Sincerely,      Titus Dubin. Alwyn Ren, MD,FACP,FCCP  Electronically Signed    WF[facility]/MedQ  DD: 01/31/2007  DT: 01/31/2007  Job #: [id number]   CC:    Norma Fredrickson

## 2011-03-29 NOTE — Discharge Summary (Signed)
   NAMECHARNE, MCBRIEN NO.:  [id number]   MEDICAL RECORD NO.:  [id number]                   PATIENT TYPE:  INP   LOCATION:  2008                                 FACILITY:  [facility]   PHYSICIAN:  Rollene Rotunda, M.D. [facility]            DATE OF BIRTH:  1951-01-19   DATE OF ADMISSION:  02/03/2003  DATE OF DISCHARGE:  02/04/2003                                 DISCHARGE SUMMARY   DISCHARGE DIAGNOSIS:  1. Atypical chest pain, etiology unclear.  2. History of palpitations.  3. 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   PROCEDURES:  Exercise treadmill Cardiolite with images revealing no  ischemia, EF 69%.   HOSPITAL COURSE:  Please seen the admission History and Physical by Rollene Rotunda, M.D. for complete details.  Briefly, this 59 year old female  presented with new onset chest burning of prolonged duration.  Cardiac  enzymes were negative.  She went for gaited exercise treadmill Cardiolite on  02/04/2003.  She exercised 2 minutes and 13 seconds to stage 3 on Bruce  protocol without chest pain or shortness of breath.  Images revealed no  ischemia, some breast attenuation, and EF of 69%.  She was given empiric  Protonix to treat her chest pain. She was asked to see her primary care  physician within the following week.  She can see Dr. Antoine Poche in the future  as needed.   LABORATORY DATA:  White blood cell count 5200, hemoglobin 14, hematocrit 41,  platelet count 244,000.  INR 0.9.  Sodium 137, potassium 4.7, chloride 105,  CO2 26, glucose 99, BUN 13, creatinine 1.0.  Total bilirubin 0.7, alkaline  phosphatase 64, AST 20, ALT 14, total protein 6.6, albumin 3.7.  Calcium  8.9.  Amylase 57, lipase 32.  Cardiac enzymes negative x 3. Total  cholesterol 191, triglycerides 97, HDL 66, LDL 106.  TSH 7.026, free T4 8.8,  free T3 122.2.   Admission chest x-ray: No active disease.   DISCHARGE MEDICATIONS:  1. Protonix 40 mg a day.  2. Estrace.  3. Saletin.   ACTIVITY:  As tolerated.   DIET:  Low-fat, low-sodium.    FOLLOW UP:  The patient is to follow up with Dr. Andrey Campanile next week, and she  is to see Dr. Antoine Poche in the future as needed.     Tereso Newcomer, P.A.                        Rollene Rotunda, M.D. [facility]    SW/MEDQ  D:  02/04/2003  T:  02/04/2003  Job:  [id number]   cc:   Vale Haven. Andrey Campanile, M.D.  [address]  [place]  [place] [address]  Fax: [phone]

## 2011-03-29 NOTE — Discharge Summary (Signed)
NAMEJAYNELL, CASTAGNOLA NO.:  [id number]   MEDICAL RECORD NO.:  [id number]          PATIENT TYPE:  INP   LOCATION:  1614                         FACILITY:  [facility]   PHYSICIAN:  Ollen Gross, M.D.    DATE OF BIRTH:  02-19-1951   DATE OF ADMISSION:  06/03/2007  DATE OF DISCHARGE:  06/06/2007                               DISCHARGE SUMMARY   ADMITTING DIAGNOSES:  1. Osteoarthritis, right hip.  2. Anxiety.  3. Hypothyroidism.  4. History of superficial thrombosis (no deep vein thrombosis).  5. History of tonsillar cancer.   DISCHARGE DIAGNOSES:  1. Osteoarthritis, right hip, status post right total hip      arthroplasty.  2. Mild postoperative blood loss anemia.  Did not require transfusion.  3. Anxiety.  4. Hypothyroidism.  5. History of superficial thrombosis (no deep vein thrombosis).  6. History of tonsillar cancer.   PROCEDURE:  June 03, 2007, right total hip.  Surgeon Dr. Lequita Halt,  assistant Avel Peace P.A.-C.   CONSULTS:  None.   BRIEF HISTORY:  Samara Deist is a 60 year old female with end-stage arthritis  of the right hip with progressive worsening pain and dysfunction,  successful left total hip, now presents to have the right side none.   LABORATORY DATA:  Preoperative CBC showed a hemoglobin 12.55, hematocrit  of 37.5, white cell count 5.2.  Postoperative hemoglobin down to 9.5,  drifted down to 9.2, came back up and has stabilized back up at 9.7 with  a hematocrit of 28.5.  PT/PTT preoperative 11.9 and 25, respectively.  INR 0.9.  Serial protimes followed; last noted PT/INR 22 and 1.8.  Chem  panel on admission all within normal limits.  Serial BMETs were  followed.  Electrolytes remained within normal limits.  Preoperative UA  negative.  Blood group type A+.  Hip films - advanced arthritis, right  hip, on May 27, 2007.  Portable hip and pelvis film on June 03, 2007.  Right total hip without immediate complicating features.  EKG January 20, 2007 -  sinus rhythm; interpretation made without knowledge of patient,  marked left axis deviation, left bundle branch block confirmed.   HOSPITAL COURSE:  The patient was admitted to [facility],  tolerated the procedure well, later transferred to recovery room and  orthopedic floor.  Started on PCA and p.o. analgesic for pain control  following surgery.  Had decent night following surgery, actually doing  very well on the morning of day one.  She did very well.  She had  undergone the other side earlier in the year.  She had excellent urinary  output.  Hemoglobin was a little bit low at 9.5.  She was placed on  iron, started getting up with PT.  By day 2, she was doing well except  for some muscle spasms and soreness.  Hemoglobin was down to 9.2.  She  was on the iron, started getting up with therapy, progressed well with  her PT, was ambulating about 150 feet.  It was felt she would do well,  and by postoperative day #3, June 06, 2007, she was  walking over 300  feet, tolerating her medications.  Dressing had been changed on day 2,  incision looked good, and she was discharged home.   DISCHARGE/PLAN:  1. The patient was discharged home on June 06, 2007.  2. Discharge diagnoses - please see above.  3. Discharge medications - Percocet, Robaxin and Coumadin.   FOLLOW-UP:  Two weeks.   DIET:  Resume home diet.   ACTIVITY:  Partial weightbearing 25-50% right lower extremity, total hip  protocol.  Home health PT and home health nursing.   DISPOSITION:  Home.   CONDITION ON DISCHARGE:  Improving.      Alexzandrew L. Perkins, P.A.C.      Ollen Gross, M.D.  Electronically Signed    ALP/MEDQ  D:  06/11/2007  T:  06/12/2007  Job:  [id number]   cc:   Ollen Gross, M.D.  Fax: [phone]   Titus Dubin. Alwyn Ren, MD,FACP,FCCP  [phone] [place]  [place] [address]

## 2011-03-29 NOTE — Op Note (Signed)
Danielle Pratt, Danielle Pratt NO.:  [id number]   MEDICAL RECORD NO.:  [id number]          PATIENT TYPE:  INP   LOCATION:  0006                         [facility]ACILITY:  [facility]   PHYSICIAN:  Ollen Gross, M.D.    DATE OF BIRTH:  1951-01-30   DATE OF PROCEDURE:  02/23/2007  DATE OF DISCHARGE:                               OPERATIVE REPORT   PREOPERATIVE DIAGNOSIS:  Osteoarthritis, left hip.   POSTOPERATIVE DIAGNOSIS:  Osteoarthritis, left hip.   PROCEDURE:  Left total hip arthroplasty.   SURGEON:  Ollen Gross, M.D.   ASSISTANT:  Alexzandrew L. Julien Girt, P.A.   ANESTHESIA:  Spinal.   ESTIMATED BLOOD LOSS:  200.   DRAIN:  None.   COMPLICATIONS:  None.   CONDITION:  Stable to recovery.   BRIEF CLINICAL NOTE:  Ms. Benthall is a 60 year old female with severe end-  stage osteoarthritis, both hips, left more symptomatic than the right.  She has failed nonoperative management and presents for total hip  arthroplasty.   PROCEDURE IN DETAIL:  After the successful administration of a spinal  anesthetic, the patient was placed in the right lateral decubitus  position with the left side up and held with the hip positioner.  The  left lower extremity was isolated from her perineum with plastic drapes  and prepped and draped in the usual sterile fashion.  Short  posterolateral incision is made with a 10 blade through subcutaneous  tissue to the level of the fascia lata, which was incised in line with  the skin incision.  Sciatic nerve was palpated and protected and the  short external rotators isolated off the femur.  Capsulectomy was  performed and the hip is dislocated.  The center of the femoral head is  marked and a trial prosthesis placed such that the center of the trial  head corresponds to the center of the native femoral head.  Osteotomy  line is marked on the femoral neck and osteotomy made with an  oscillating saw.  Femoral head is removed and then the femur  retracted  anteriorly to gain acetabular exposure.   Acetabular retractors are placed and then labrum and osteophytes  removed.  Reaming starts at 45 mm, coursing in increments of 2 up to 51  mm and then a 52 mm Pinnacle acetabular shell was placed in anatomic  position and transfixed with a single dome screw.  The apex hole  eliminator is placed and the permanent 36 mm neutral Ultramet metal  liner is placed.   The femur was prepared with the canal finder and irrigation.  Axial  reaming is performed to 13.5 mm, proximal reaming to an 18D and the  sleeve machined to a large.  An 18D large trial sleeve is placed with an  18 x 13 stem and a 36 +8 neck matching native anteversion.  A 36.0 head  is placed in the hip is reduced with outstanding stability.  She had  full extension, flexion and external rotation in 70 degrees flexion, 40  degrees adduction, 90 degrees internal rotation, and 90 degrees of  flexion in 70 degrees internal  rotation.  By placing the left leg on top  of the right, it felt as though the leg lengths were equal.  The hip was  dislocated and all trials were removed.  The permanent 18D large sleeve  is placed with an 18 x 13 stem, 36 +8 neck matching the native  anteversion.  The 36.0 was placed.  The hip was reduced with the same  stability parameters.  The wound is copiously irrigated with saline  solution and the short rotators reattached to the femur through drill  holes.  The fascia lata is closed with interrupted #1 Vicryl, the subcu  closed with #1 and #2-0 Vicryl, and the subcuticular with a running 4-0  Monocryl.  The incision is cleaned and dried and Steri-Strips and a  bulky sterile dressing applied.  She is placed into a knee immobilizer,  awakened and transported to recovery in stable condition.      Ollen Gross, M.D.  Electronically Signed     FA/MEDQ  D:  02/23/2007  T:  02/23/2007  Job:  [id number]

## 2011-03-29 NOTE — H&P (Signed)
Danielle Pratt, Pratt NO.:  [id number]   MEDICAL RECORD NO.:  [id number]                   PATIENT TYPE:  INP   LOCATION:  2008                                 FACILITY:  [facility]   PHYSICIAN:  Rollene Rotunda, M.D. [facility]            DATE OF BIRTH:  [birth year]   DATE OF ADMISSION:  02/03/2003  DATE OF DISCHARGE:                                HISTORY & PHYSICAL   REASON FOR PRESENTATION:  The patient with chest pain.   HISTORY OF PRESENT ILLNESS:  The patient is a pleasant 60 year old white  female with no prior cardiac history.  She reports that last night at  midnight she developed chest discomfort.  It was a tightness and burning.  It started slightly above her sternum and went down to the upper epigastric  area.  There was no radiation to her jaw or arms.  It has been constant.  She found it very difficult to sleep.  It has been 3/10 in intensity,  although she has difficulty rating it.  She was slightly nauseated.  She has  had no diaphoresis.  She has not been short of breath.  She did take and  aspirin this morning before presenting to Dr. Andrey Campanile and had no improvement  in the symptoms.  She has since been given a Protonix without improvement.  She has never had symptoms like this before.  She did ride an exercise  bicycle for 20 minutes two days ago.  This was unusual for her.  She is  trying to get into activity.  With this, she did not develop chest  discomfort.  She denies palpitations, presyncope, or syncope.  She has no  PND or orthopnea.   PAST MEDICAL HISTORY:  1. Mild hyperlipidemia, diet controlled.  2. Right tonsillar cancer.   PAST SURGICAL HISTORY:  1. Hysterectomy.  2. Tonsillar cancer resected and radiated.  3. Right lymph nodes excised (benign).  4. C-section.   ALLERGIES:  None.   CURRENT MEDICATIONS:  1. Estrace 2 mg daily.  2. Salagen 5 mg t.i.d.  3. Multivitamin.   SOCIAL HISTORY:  The patient lives in [place]  with her husband.  She has  two twin 15 year old sons.  She smoked a little bit in high school.  She  drinks a couple of beers per week.   FAMILY HISTORY:  Noncontributory for early coronary artery disease in first-  degree relatives.  Her 36 year old father did recently have PTCA.   REVIEW OF SYSTEMS:  Positive for occasional palpitations.  Otherwise,  negative for all other systems.   PHYSICAL EXAMINATION:  GENERAL:  The patient is in no distress.  VITAL SIGNS:  Blood pressure 133/73, heart rate 76 and regular.  HEENT:  Eyelids unremarkable.  Pupils are equal, round, and reactive to  light.  Fundi not visualized.  Oral mucosa unremarkable, torus palantine.  NECK:  No jugular venous distention, wave form within  normal limits, carotid  upstroke brisk and symmetric and no bruits, no thyromegaly.  LYMPHATICS:  No cervical, axillary, or inguinal adenopathy.  LUNGS:  Clear to auscultation bilaterally.  BACK:  No costovertebral angle tenderness.  CHEST:  Unremarkable.  HEART:  PMI not displaced or sustained, S1 and S2 within normal limits.  No  S3, no S4, no murmurs.  ABDOMEN:  Flat, positive bowel sounds.  Normal in frequency and pitch.  No  bruits, rebound, guarding, midline pulse.  No mass, no hepatomegaly, no  splenomegaly.  SKIN:  No rash, nodules.  EXTREMITIES:  2+ pulses throughout.  No edema, cyanosis, or clubbing.  NEUROLOGIC:  Oriented to person, place, and time.  Cranial nerves II-XII  grossly intact.  Motor grossly intact.   LABORATORY DATA:  EKG:  Incomplete right bundle branch block, no acute ST-T  wave changes, normal axis, normal intervals.   WBC 5.2, hemoglobin 14.  Sodium 137, potassium 4.7, BUN 13, creatinine 1.2.  CK 122, MB 1.1, troponin within normal limits.  AST 20, ALT 14, total  bilirubin 0.7, amylase 57, lipase 32.   Chest x-ray:  No acute disease.   ASSESSMENT AND PLAN:  1. Chest discomfort.  The patient's chest discomfort is somewhat atypical.     The  plan is to rule out myocardial infarction with chest pain     observation.  I do think screening with exercise Cardiolite is prudent if     she rules out for myocardial infarction and this will be arranged.  2. Palpitations.  This is a rare complaint.  This can be followed     symptomatically.  3. Risk reduction.  She will have a lipid profile checked and treated     according to NCEP guidelines.                                               Rollene Rotunda, M.D. [facility]    JH/MEDQ  D:  02/03/2003  T:  02/04/2003  Job:  [id number]   cc:   Vale Haven. Andrey Campanile, M.D.  [address]  [place]  [place] [address]  Fax: [phone]

## 2011-03-29 NOTE — Discharge Summary (Signed)
NAMENISHKA, HEIDE NO.:  [id number]   MEDICAL RECORD NO.:  [id number]          PATIENT TYPE:  INP   LOCATION:  1508                         [facility]AC[facility]   PHYSICIAN:  Ollen Gross, M.D.    DATE OF BIRTH:  Sep 02, 1951   DATE OF ADMISSION:  02/23/2007  DATE OF DISCHARGE:  02/26/2007                               DISCHARGE SUMMARY   ADMITTING DIAGNOSES:  1. Osteoarthritis, left hip.  2. Anxiety.  3. Hypothyroidism.  4. History of superficial blood clot (not deep vein thrombosis).  5. History of tonsillar cancer status post resection and radiation.  6. Left bundle branch block per EKG.   DISCHARGE DIAGNOSES:  1. Osteoarthritis, left hip, status post left total hip replacement      arthroplasty.  2. Mild postoperative blood loss anemia.  Did not require transfusion.  3. Anxiety.  4. Hypothyroidism.  5. History of superficial blood clot (not deep vein thrombosis).  6. History of tonsillar cancer status post resection and radiation.  7. Leff bundle branch block per EKG.   PROCEDURE:  February 23, 2007, left total hip.  Surgeon Dr. Lequita Halt.  Assistant Avel Peace, P.A.-C.   CONSULTS:  None.   BRIEF HISTORY:  Ms. Distler is a 60 year old female with severe end-stage  arthritis of both hips, left more symptomatic than right.  She failed  nonoperative management and now presents for a total hip arthroplasty.   LABORATORY DATA:  Preoperative CBC showed a hemoglobin 13.1, hematocrit  38.1.  Postoperative hemoglobin 10.6 - down to 10.1.  Last noted H&H 9.3  and 26.8.  PT/PTT preoperative 12.1 and 27, respectively.  INR 0.9.  Serial pro times were followed.  Last noted PT/INR 19.9, 1.6.  Chem  panel on admission all within normal limits.  Serial BMETs were  followed.  Electrolytes remained within normal limits.  Preoperative UA  negative.  Blood group type A+.  EKG:  Marked left axis deviation; broad  R in 1, V5 or V6; prolonged QRS; left bundle branch block  confirmed.  Adenosine Cardiolite stress test February 02, 2007:  Normal stress nuclear  test, fixed septal defect likely due to the left bundle branch block.   HOSPITAL COURSE:  The patient was admitted to [facility].  She  tolerated the procedure well, later returned to the recovery room and to  the orthopedic floor.  Started on PCA and p.o. analgesic pain control  following surgery.  Started on Coumadin for DVT prophylaxis.  Tolerating  her medications on the morning of day #1, so the PCA which was started  postoperatively was discontinued.  Discontinued her knee immobilizer.  Started back on her home medications.  Started getting up out of bed.  She was noted to have some cramping and spasms.  By day #2, the cramps  and spasms had improved.  She rested a little bit better.  Dressing was  changed.  Incision looked excellent.  We discontinued the Foley.  Discontinued the fluids.  Hemoglobin was 10.1.  From a therapy  standpoint, she was getting up, and she ambulated about 80 feet but  walked 200 feet later that afternoon.  She was doing very well,  progressed well.  Unfortunately, she was having a little bit of vague  headache.  We treated her symptoms.  Later that afternoon, the headache  got worse, but the next day, she did have a little bit of headache and  nausea.  She did require fluid that night before.  The following  morning, she was doing a little bit better.  She was able to tolerate  her breakfast, and the nausea had subsided.  Due to the fact that this  had improved, we continued on her medications and was discharged home.   DISCHARGE/PLAN:  1. The patient was discharged home on February 26, 2007.  2. Discharge diagnoses:  Please see above.  3. Discharge medications:  Coumadin, Percocet, Robaxin.   FOLLOW-UP:  Two weeks.   ACTIVITY:  Partial weightbearing 25-50% left lower extremity, hip  precautions total hip protocol.   DIET:  As tolerated.   DISPOSITION:   Home.   CONDITION ON DISCHARGE:  Improved.      Alexzandrew L. Perkins, P.A.C.      Ollen Gross, M.D.  Electronically Signed    ALP/MEDQ  D:  03/18/2007  T:  03/18/2007  Job:  [id number]

## 2011-03-29 NOTE — H&P (Signed)
Danielle Pratt, HIRT NO.:  [id number]   MEDICAL RECORD NO.:  [id number]          PATIENT TYPE:  INP   LOCATION:  NA                           FACILITY:  [facility]   PHYSICIAN:  Ollen Gross, M.D.    DATE OF BIRTH:  [birth year]   DATE OF ADMISSION:  DATE OF DISCHARGE:                              HISTORY & PHYSICAL   CHIEF COMPLAINT:  Left hip pain.   HISTORY OF PRESENT ILLNESS:  The patient 60 year old female who has been  seen by Dr. Lequita Halt for a several-year history of progressive worsening  pain. Unfortunately the left hip has gotten much more progressive since  this past fall.  It has gotten to a point where it is hurting her all  the time.  She was seen in the office where her x-rays show severe bone-  on-bone changes in both hips.  Left is worse there are the right and has  shown progression since her films back in the fall.  It is felt she  would benefit undergoing surgical intervention.  Risks and benefits been  discussed and she elects to proceed with surgery.  She has been seen and  cleared by Dr. Alwyn Ren.  She had a recent stress test which was negative.   ALLERGIES:  No known drug allergies.   CURRENT MEDICATIONS:  Pilocarpine,  Estradiol Effexor X-RAY, Darvocet,  methocarbamol, Bioflex, multivitamin.   PAST MEDICAL HISTORY:  1. Anxiety.  2. Hypothyroidism.  3. History of deep vein thrombosis.  4. History of tonsillar cancer.   PAST SURGICAL HISTORY:  1. Cesarean.  2. Hysterectomy.  3. Right tonsil removed; also lymph node resection secondary to      tonsillar cancer.   SOCIAL HISTORY:  Married.  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  Nonsmoker.  Two to three drinks of alcohol per week.  Two children.   FAMILY HISTORY:  Father with history of heart disease.  Mother with  history of cervical cancer.   REVIEW OF SYSTEMS:  GENERAL:  No fevers, chills, night sweats.  NEUROLOGIC:  Seizures, syncope or paralysis.  RESPIRATORY:  No shortness  of  breath, productive cough or hemoptysis.  CARDIOVASCULAR:  No chest  pain, PND or orthopnea.  GI:  No nausea, vomiting, diarrhea or  constipation.  GU:  No dysuria or discharge.  MUSCULOSKELETAL:  Left  hip.   PHYSICAL EXAMINATION:  VITAL SIGNS:  Pulse 64, respirations 12, blood  pressure 120/72.  GENERAL:  A 60 year old white female, tall, slim frame, in no acute  distress.  She is alert and oriented, cooperative and  pleasant.  Appears mildly anxious during exam.  HEENT:  Normocephalic, atraumatic.  Pupils are round and reactive.  Oropharynx clear.  Noted to wear glasses.  NECK:  Supple.  Previous surgical scar noted.  CHEST:  Clear anterior and posterior chest walls.  No rhonchi, rales or  wheezing.  HEART:  Regular rate and rhythm.  No murmur.  S1 and S2 noted.  ABDOMEN:  Soft, flat, nontender.  Bowel sounds present.  RECTAL/BREASTS/GENITALIA:  Not done.  Not pertinent to present illness.  EXTREMITIES:  Left  hip shows flexion of only 100 degrees.  There is 10  degrees of internal rotation,  15 degrees of external rotation, about 20  degrees abduction.   IMPRESSION:  1. Osteoarthritis, left hip.  2. Anxiety.   PLAN:  1. Hypothyroidism.  2. History of blood clot.  3. History of tonsillar cancer, status post resection and radiation.  4. Left bundle branch block per EKG.   PLAN:  The patient admitted to [facility] to undergo a left  total hip replacement arthroplasty.  Surgery will be performed by Dr.  Ollen Gross .  Dr. Alwyn Ren will be notified of the room on admission  and be consulted if needed for medical assistance with this patient  throughout the hospital course.      Alexzandrew L. Julien Girt, P.A.      Ollen Gross, M.D.  Electronically Signed    ALP/MEDQ  D:  02/22/2007  T:  02/23/2007  Job:  [id number]   cc:   Titus Dubin. Alwyn Ren, MD,FACP,FCCP  [phone] [place]  [place] [address]

## 2011-04-17 ENCOUNTER — Other Ambulatory Visit: Payer: Self-pay | Admitting: Internal Medicine

## 2011-04-18 MED ORDER — LEVOTHYROXINE SODIUM 50 MCG PO TABS: ORAL_TABLET | ORAL | Status: DC

## 2011-04-18 NOTE — Telephone Encounter (Signed)
RX sent to pharmacy  

## 2011-04-26 ENCOUNTER — Other Ambulatory Visit: Payer: Self-pay | Admitting: Internal Medicine

## 2011-05-30 ENCOUNTER — Other Ambulatory Visit: Payer: Self-pay | Admitting: Internal Medicine

## 2011-05-30 MED ORDER — VENLAFAXINE HCL ER 75 MG PO CP24: 75.0000 mg | ORAL_CAPSULE | Freq: Every day | ORAL | Status: DC

## 2011-05-30 NOTE — Telephone Encounter (Signed)
Patient is Due for CPX-@ that time a year supply of refills can be given on medications

## 2011-06-13 ENCOUNTER — Other Ambulatory Visit: Payer: Self-pay | Admitting: Internal Medicine

## 2011-06-13 MED ORDER — LEVOTHYROXINE SODIUM 50 MCG PO TABS: ORAL_TABLET | ORAL | Status: DC

## 2011-06-13 NOTE — Telephone Encounter (Signed)
Done

## 2011-07-25 ENCOUNTER — Telehealth: Payer: Self-pay | Admitting: *Deleted

## 2011-07-25 NOTE — Telephone Encounter (Signed)
Patient states that she is traveling out of country to [place] next week and would like to know if you suggest her to "have something on hand for travel" [i.e. ABX] Ok to leave message on machine.

## 2011-07-26 MED ORDER — DOXYCYCLINE HYCLATE 100 MG PO TABS: 100.0000 mg | ORAL_TABLET | Freq: Two times a day (BID) | ORAL | Status: DC

## 2011-07-26 NOTE — Telephone Encounter (Signed)
Addended by: Edgardo Roys on: 07/26/2011 10:02 AM   Modules accepted: Orders

## 2011-07-26 NOTE — Telephone Encounter (Signed)
Doxycycline 100 mg bid # 14; she would need to stay out of sun if this is needed for  gastroenteritis /diarrhea symptoms

## 2011-07-26 NOTE — Telephone Encounter (Signed)
Left message on voicemail informing patient rx sent to pharmacy on file

## 2011-08-26 LAB — BASIC METABOLIC PANEL WITH GFR
BUN: 2 — ABNORMAL LOW
CO2: 30
Calcium: 8.1 — ABNORMAL LOW
Chloride: 101
Creatinine, Ser: 0.85
GFR calc non Af Amer: >60
Glucose, Bld: 119 — ABNORMAL HIGH
Potassium: 4.1
Sodium: 136

## 2011-08-26 LAB — CBC
HCT: 26.8 — ABNORMAL LOW
HCT: 27.8 — ABNORMAL LOW
HCT: 28.5 — ABNORMAL LOW
Hemoglobin: 9.2 — ABNORMAL LOW
Hemoglobin: 9.7 — ABNORMAL LOW
MCHC: 33.4
MCHC: 34.2
MCV: 84.7
MCV: 84.9
Platelets: 232
RBC: 3.14 — ABNORMAL LOW
RBC: 3.28 — ABNORMAL LOW
RBC: 3.36 — ABNORMAL LOW
RBC: 4.36
RDW: 14.5 — ABNORMAL HIGH
RDW: 14.8 — ABNORMAL HIGH
WBC: 4.6
WBC: 7.6

## 2011-08-26 LAB — PROTIME-INR
INR: 1.5
INR: 1.8 — ABNORMAL HIGH
Prothrombin Time: 13.9
Prothrombin Time: 19.1 — ABNORMAL HIGH
Prothrombin Time: 22 — ABNORMAL HIGH

## 2011-08-26 LAB — CROSSMATCH: Antibody Screen: NEGATIVE

## 2011-08-26 LAB — BASIC METABOLIC PANEL
Chloride: 101
GFR calc Af Amer: >60
GFR calc non Af Amer: >60
Potassium: 4.1

## 2011-08-26 LAB — URINALYSIS, ROUTINE W REFLEX MICROSCOPIC
Bilirubin Urine: NEGATIVE
Nitrite: NEGATIVE
Specific Gravity, Urine: 1.009
Urobilinogen, UA: 0.2

## 2011-08-26 LAB — COMPREHENSIVE METABOLIC PANEL
ALT: 14
AST: 25
Albumin: 3.6
CO2: 29
Calcium: 9.4
GFR calc Af Amer: >60
GFR calc non Af Amer: >60
Sodium: 141
Total Protein: 6.9

## 2011-08-26 LAB — APTT: aPTT: 25

## 2011-08-30 ENCOUNTER — Other Ambulatory Visit: Payer: Self-pay | Admitting: Internal Medicine

## 2011-08-30 MED ORDER — LEVOTHYROXINE SODIUM 50 MCG PO TABS: ORAL_TABLET | ORAL | Status: DC

## 2011-08-30 NOTE — Telephone Encounter (Signed)
Patient needs to schedule TSH 244.9 

## 2011-09-05 ENCOUNTER — Other Ambulatory Visit: Payer: Self-pay | Admitting: Internal Medicine

## 2011-09-06 ENCOUNTER — Other Ambulatory Visit: Payer: Self-pay | Admitting: Internal Medicine

## 2011-09-06 DIAGNOSIS — E039 Hypothyroidism, unspecified: Secondary | ICD-10-CM

## 2011-09-06 MED ORDER — VENLAFAXINE HCL ER 75 MG PO CP24: 75.0000 mg | ORAL_CAPSULE | Freq: Every day | ORAL | Status: DC

## 2011-09-06 NOTE — Telephone Encounter (Signed)
RX sent

## 2011-09-06 NOTE — Telephone Encounter (Signed)
Pt has appt scheduled Monday 09/09/11 for her TSH to be checked.

## 2011-09-09 ENCOUNTER — Other Ambulatory Visit (INDEPENDENT_AMBULATORY_CARE_PROVIDER_SITE_OTHER): Payer: BC Managed Care – PPO

## 2011-09-09 DIAGNOSIS — E039 Hypothyroidism, unspecified: Secondary | ICD-10-CM

## 2011-09-09 LAB — TSH: TSH: 1.81 u[IU]/mL (ref 0.35–5.50)

## 2011-09-30 ENCOUNTER — Other Ambulatory Visit: Payer: Self-pay | Admitting: Internal Medicine

## 2011-09-30 MED ORDER — LEVOTHYROXINE SODIUM 50 MCG PO TABS: ORAL_TABLET | ORAL | Status: DC

## 2011-09-30 NOTE — Telephone Encounter (Signed)
RX sent

## 2011-12-13 ENCOUNTER — Encounter: Payer: Self-pay | Admitting: Internal Medicine

## 2012-01-09 ENCOUNTER — Other Ambulatory Visit (HOSPITAL_COMMUNITY): Payer: Self-pay | Admitting: Obstetrics & Gynecology

## 2012-01-09 ENCOUNTER — Telehealth: Payer: Self-pay | Admitting: Internal Medicine

## 2012-01-09 DIAGNOSIS — Z1231 Encounter for screening mammogram for malignant neoplasm of breast: Secondary | ICD-10-CM

## 2012-01-09 NOTE — Telephone Encounter (Signed)
Patient would like to have lab work done prior to CPE on 03/02/12. She is scheduled with the lab for 02/19/12. What labs/dx code will she need?

## 2012-01-10 NOTE — Telephone Encounter (Signed)
Thank you :)

## 2012-01-10 NOTE — Telephone Encounter (Signed)
Lipid/Hep/BMP/CBCD/TSH 272.4/244.9/v70.0

## 2012-01-23 ENCOUNTER — Other Ambulatory Visit: Payer: Self-pay | Admitting: Internal Medicine

## 2012-02-11 ENCOUNTER — Ambulatory Visit (HOSPITAL_COMMUNITY)
Admission: RE | Admit: 2012-02-11 | Discharge: 2012-02-11 | Disposition: A | Discharge status: Home / Self Care | Payer: BC Managed Care – PPO | Source: Ambulatory Visit | Attending: Obstetrics & Gynecology | Admitting: Obstetrics & Gynecology

## 2012-02-11 DIAGNOSIS — Z1231 Encounter for screening mammogram for malignant neoplasm of breast: Secondary | ICD-10-CM | POA: Insufficient documentation

## 2012-02-19 ENCOUNTER — Other Ambulatory Visit (INDEPENDENT_AMBULATORY_CARE_PROVIDER_SITE_OTHER): Payer: BC Managed Care – PPO

## 2012-02-19 DIAGNOSIS — E039 Hypothyroidism, unspecified: Secondary | ICD-10-CM

## 2012-02-19 DIAGNOSIS — Z0000 Encounter for general adult medical examination without abnormal findings: Secondary | ICD-10-CM

## 2012-02-19 DIAGNOSIS — E785 Hyperlipidemia, unspecified: Secondary | ICD-10-CM

## 2012-02-19 LAB — BASIC METABOLIC PANEL
BUN: 17 mg/dL (ref 6–23)
Calcium: 9 mg/dL (ref 8.4–10.5)
GFR: 60.18 mL/min (ref >60.00)
Glucose, Bld: 70 mg/dL (ref 70–99)

## 2012-02-19 LAB — HEPATIC FUNCTION PANEL
ALT: 15 U/L (ref 0–35)
Alkaline Phosphatase: 61 U/L (ref 39–117)

## 2012-02-19 LAB — CBC WITH DIFFERENTIAL/PLATELET
Basophils Absolute: 0 10*3/uL (ref 0.0–0.1)
HCT: 38.7 % (ref 36.0–46.0)
Lymphocytes Relative: 17.5 % (ref 12.0–46.0)
Lymphs Abs: 1.1 10*3/uL (ref 0.7–4.0)
Monocytes Relative: 5 % (ref 3.0–12.0)
Platelets: 264 10*3/uL (ref 150.0–400.0)
RDW: 14.8 % — ABNORMAL HIGH (ref 11.5–14.6)

## 2012-02-19 LAB — LIPID PANEL
Cholesterol: 209 mg/dL — ABNORMAL HIGH (ref 0–200)
HDL: 67.3 mg/dL (ref >39.00)
Total CHOL/HDL Ratio: 3
Triglycerides: 116 mg/dL (ref 0.0–149.0)
VLDL: 23.2 mg/dL (ref 0.0–40.0)

## 2012-02-19 LAB — TSH: TSH: 3.52 u[IU]/mL (ref 0.35–5.50)

## 2012-02-24 ENCOUNTER — Other Ambulatory Visit: Payer: Self-pay | Admitting: Internal Medicine

## 2012-03-02 ENCOUNTER — Encounter: Payer: Self-pay | Admitting: Internal Medicine

## 2012-03-02 ENCOUNTER — Ambulatory Visit (INDEPENDENT_AMBULATORY_CARE_PROVIDER_SITE_OTHER): Payer: BC Managed Care – PPO | Admitting: Internal Medicine

## 2012-03-02 VITALS — BP 124/78 | HR 72 | Temp 97.4°F | Resp 12 | Ht 70.0 in | Wt 154.6 lb

## 2012-03-02 DIAGNOSIS — Z0000 Encounter for general adult medical examination without abnormal findings: Secondary | ICD-10-CM

## 2012-03-02 DIAGNOSIS — E039 Hypothyroidism, unspecified: Secondary | ICD-10-CM

## 2012-03-02 MED ORDER — LEVOTHYROXINE SODIUM 50 MCG PO TABS: ORAL_TABLET | ORAL | Status: DC

## 2012-03-02 NOTE — Progress Notes (Signed)
  Subjective:    Patient ID: Danielle Pratt, female    DOB: December 18, 1950, 61 y.o.   MRN: [id number]  HPI  Danielle Pratt is here for a physical; she denies acute issues .      Review of Systems Patient reports no significant  vision/ hearing  changes, adenopathy,fever, weight change,  persistant / recurrent hoarseness , swallowing issues, chest pain,palpitations,edema,persistant /recurrent cough, hemoptysis, dyspnea( rest/ exertional/paroxysmal nocturnal), gastrointestinal bleeding(melena, rectal bleeding), abdominal pain, significant heartburn,  bowel changes,GU symptoms(dysuria, hematuria,pyuria, incontinence), Gyn symptoms(abnormal  bleeding , pain),  syncope, focal weakness, memory loss,numbness & tingling, skin/hair /nail changes,abnormal bruising or bleeding, anxiety,or depression.   She is receiving notification that followup colonoscopy is due. As noted she is asymptomatic. There is no family history of colon polyps or colon cancer    Objective:   Physical Exam Gen.: Thin but healthy and well-nourished in appearance. Alert, appropriate and cooperative throughout exam. Head: Normocephalic without obvious abnormalities  Eyes: No corneal or conjunctival inflammation noted. Pupils equal round reactive to light and accommodation. Fundal exam is benign without hemorrhages, exudate, papilledema. Extraocular motion intact. Vision grossly normal with lenses . Ears: External  ear exam reveals no significant lesions or deformities. Canals clear .TMs normal. Hearing is grossly normal bilaterally. Nose: External nasal exam reveals no deformity or inflammation. Nasal mucosa are pink and moist. No lesions or exudates noted. Mouth: Oral mucosa and oropharynx reveal no lesions or exudates. Teeth in good repair. The left tonsil is absent. The left posterior pharynx is lower than the right. Postoperative scarring is present on the right with no residual tonsillar tissue Neck: No deformities, masses, or tenderness  noted. Range of motion & Thyroid normal Lungs: Normal respiratory effort; chest expands symmetrically. Lungs are clear to auscultation without rales, wheezes, or increased work of breathing. Heart: Normal rate and rhythm. Normal S1 and S2. No gallop, click, or rub. S4 with slight slurring ;no murmur. Abdomen: Bowel sounds normal; abdomen soft and nontender. No masses, organomegaly or hernias noted.Aorta palpable ; no AAA  Genitalia: Dr Jennette Kettle Musculoskeletal/extremities: No deformity or scoliosis noted of  the thoracic or lumbar spine. No clubbing, cyanosis, edema, or deformity noted. Range of motion  normal .Tone & strength  normal.Joints : minor DJD changes. Nail health  good. Vascular: Carotid, radial artery, dorsalis pedis and  posterior tibial pulses are full and equal. No bruits present. Neurologic: Alert and oriented x3. Deep tendon reflexes symmetrical and normal.          Skin: Intact without suspicious lesions or rashes. Lymph: No cervical, axillary lymphadenopathy present. Psych: Mood and affect are normal. Normally interactive                                                                                          Assessment & Plan:  #1 comprehensive physical exam; no acute findings #2 see Problem List with Assessments & Recommendations  #3 fasting hyperglycemia mild. No family history diabetes. Annual A1c recommended. Plan: see Orders

## 2012-03-02 NOTE — Patient Instructions (Signed)
Preventive Health Care: Exercise  30-45  minutes a day, 3-4 days a week. Walking is especially valuable in preventing Osteoporosis. Eat a low-fat diet with lots of fruits and vegetables, up to 7-9 servings per day. Consume less than 30 grams of sugar per day from foods & drinks with High Fructose Corn Syrup as # 1,2,3 or #4 on label.  Recheck TSH in 6 months to verify it remains therapeutic   PLEASE BRING THESE INSTRUCTIONS TO FOLLOW UP  LAB APPOINTMENT.This will guarantee correct labs are drawn, eliminating need for repeat blood sampling ( needle sticks ! ). Diagnoses /Codes: 244.9

## 2012-04-07 ENCOUNTER — Other Ambulatory Visit: Payer: Self-pay | Admitting: *Deleted

## 2012-04-07 MED ORDER — VENLAFAXINE HCL ER 75 MG PO CP24: ORAL_CAPSULE | ORAL | Status: DC

## 2012-04-07 NOTE — Telephone Encounter (Signed)
Rx sent 

## 2012-04-19 ENCOUNTER — Other Ambulatory Visit: Payer: Self-pay | Admitting: Internal Medicine

## 2012-09-10 ENCOUNTER — Encounter: Payer: Self-pay | Admitting: Internal Medicine

## 2012-10-21 ENCOUNTER — Other Ambulatory Visit: Payer: Self-pay | Admitting: Internal Medicine

## 2013-03-08 ENCOUNTER — Other Ambulatory Visit: Payer: Self-pay | Admitting: *Deleted

## 2013-03-08 DIAGNOSIS — E039 Hypothyroidism, unspecified: Secondary | ICD-10-CM

## 2013-03-08 MED ORDER — LEVOTHYROXINE SODIUM 50 MCG PO TABS: ORAL_TABLET | ORAL | Status: DC

## 2013-03-08 NOTE — Telephone Encounter (Signed)
Rx sent 

## 2013-04-28 ENCOUNTER — Other Ambulatory Visit (HOSPITAL_COMMUNITY): Payer: Self-pay | Admitting: Obstetrics & Gynecology

## 2013-04-28 DIAGNOSIS — Z1231 Encounter for screening mammogram for malignant neoplasm of breast: Secondary | ICD-10-CM

## 2013-05-11 ENCOUNTER — Ambulatory Visit (HOSPITAL_COMMUNITY)
Admission: RE | Admit: 2013-05-11 | Discharge: 2013-05-11 | Disposition: A | Discharge status: Home / Self Care | Payer: BC Managed Care – PPO | Source: Ambulatory Visit | Attending: Obstetrics & Gynecology | Admitting: Obstetrics & Gynecology

## 2013-05-11 ENCOUNTER — Ambulatory Visit (HOSPITAL_COMMUNITY): Payer: BC Managed Care – PPO

## 2013-05-11 DIAGNOSIS — Z1231 Encounter for screening mammogram for malignant neoplasm of breast: Secondary | ICD-10-CM | POA: Insufficient documentation

## 2013-05-13 ENCOUNTER — Other Ambulatory Visit: Payer: Self-pay | Admitting: Obstetrics & Gynecology

## 2013-05-13 DIAGNOSIS — R928 Other abnormal and inconclusive findings on diagnostic imaging of breast: Secondary | ICD-10-CM

## 2013-05-20 ENCOUNTER — Other Ambulatory Visit: Payer: Self-pay | Admitting: *Deleted

## 2013-05-20 MED ORDER — VENLAFAXINE HCL ER 75 MG PO CP24: ORAL_CAPSULE | ORAL | Status: DC

## 2013-05-20 NOTE — Telephone Encounter (Signed)
Pt with pending appt 06-09-13, Rx sent

## 2013-05-26 ENCOUNTER — Ambulatory Visit
Admission: RE | Admit: 2013-05-26 | Discharge: 2013-05-26 | Disposition: A | Discharge status: Home / Self Care | Payer: BC Managed Care – PPO | Source: Ambulatory Visit | Attending: Obstetrics & Gynecology | Admitting: Obstetrics & Gynecology

## 2013-05-26 DIAGNOSIS — R928 Other abnormal and inconclusive findings on diagnostic imaging of breast: Secondary | ICD-10-CM

## 2013-05-31 ENCOUNTER — Other Ambulatory Visit: Payer: Self-pay | Admitting: *Deleted

## 2013-05-31 DIAGNOSIS — E039 Hypothyroidism, unspecified: Secondary | ICD-10-CM

## 2013-05-31 MED ORDER — LEVOTHYROXINE SODIUM 50 MCG PO TABS: ORAL_TABLET | ORAL | Status: DC

## 2013-05-31 NOTE — Telephone Encounter (Signed)
Rx was refilled for synthroid 05-31-13.

## 2013-06-02 ENCOUNTER — Emergency Department (HOSPITAL_COMMUNITY): Payer: BC Managed Care – PPO

## 2013-06-02 ENCOUNTER — Encounter (HOSPITAL_COMMUNITY): Payer: Self-pay | Admitting: Emergency Medicine

## 2013-06-02 ENCOUNTER — Emergency Department (HOSPITAL_COMMUNITY)
Admission: EM | Admit: 2013-06-02 | Discharge: 2013-06-02 | Disposition: A | Discharge status: Home / Self Care | Payer: BC Managed Care – PPO | Attending: Emergency Medicine | Admitting: Emergency Medicine

## 2013-06-02 DIAGNOSIS — Y9389 Activity, other specified: Secondary | ICD-10-CM | POA: Insufficient documentation

## 2013-06-02 DIAGNOSIS — S73004A Unspecified dislocation of right hip, initial encounter: Secondary | ICD-10-CM

## 2013-06-02 DIAGNOSIS — E785 Hyperlipidemia, unspecified: Secondary | ICD-10-CM | POA: Insufficient documentation

## 2013-06-02 DIAGNOSIS — Z79899 Other long term (current) drug therapy: Secondary | ICD-10-CM | POA: Insufficient documentation

## 2013-06-02 DIAGNOSIS — E039 Hypothyroidism, unspecified: Secondary | ICD-10-CM | POA: Insufficient documentation

## 2013-06-02 DIAGNOSIS — S73006A Unspecified dislocation of unspecified hip, initial encounter: Secondary | ICD-10-CM | POA: Insufficient documentation

## 2013-06-02 DIAGNOSIS — X500XXA Overexertion from strenuous movement or load, initial encounter: Secondary | ICD-10-CM | POA: Insufficient documentation

## 2013-06-02 DIAGNOSIS — Y929 Unspecified place or not applicable: Secondary | ICD-10-CM | POA: Insufficient documentation

## 2013-06-02 DIAGNOSIS — Z96649 Presence of unspecified artificial hip joint: Secondary | ICD-10-CM | POA: Insufficient documentation

## 2013-06-02 MED ORDER — PROPOFOL 10 MG/ML IV BOLUS
0.5000 mg/kg | Freq: Once | INTRAVENOUS | Status: AC
  Administered 2013-06-02: 34.7 mg via INTRAVENOUS
  Filled 2013-06-02: qty 1

## 2013-06-02 MED ORDER — METHOCARBAMOL 500 MG PO TABS: 500.0000 mg | ORAL_TABLET | Freq: Two times a day (BID) | ORAL | Status: DC

## 2013-06-02 MED ORDER — MORPHINE SULFATE 4 MG/ML IJ SOLN
4.0000 mg | Freq: Once | INTRAMUSCULAR | Status: AC
  Administered 2013-06-02: 4 mg via INTRAVENOUS
  Filled 2013-06-02: qty 1

## 2013-06-02 MED ORDER — ONDANSETRON HCL 4 MG/2ML IJ SOLN
4.0000 mg | Freq: Once | INTRAMUSCULAR | Status: AC
  Administered 2013-06-02: 4 mg via INTRAVENOUS
  Filled 2013-06-02: qty 2

## 2013-06-02 NOTE — ED Notes (Signed)
Per EMS: Pt had double hip replacement 7 yrs ago. Pt was sitting and lifted up hip and felt a pop. Deformity noted. Given 200 Fentanyl, pain 5/10. 18 G L forearm.

## 2013-06-02 NOTE — ED Notes (Signed)
Bed:WA24<BR> Expected date:<BR> Expected time:<BR> Means of arrival:<BR> Comments:<BR> EMS

## 2013-06-02 NOTE — ED Provider Notes (Signed)
History    CSN: [id number] Arrival date & time 06/02/13  1947  First MD Initiated Contact with Patient 06/02/13 2007     Chief Complaint  Patient presents with  . Hip Pain   (Consider location/radiation/quality/duration/timing/severity/associated sxs/prior Treatment) HPI Comments: Presents to the ER for evaluation of severe left hip pain. Patient thinks that she has a dislocated hip. She says that happened approximately a year and half ago and this feels the same. Patient was sitting and tried to lift up her left hip and felt a pop followed by severe pain and inability to move the hip. Patient was given fentanyl by EMS and pain is much improved.  Patient is a 62 y.o. female presenting with hip pain.  Hip Pain   Past Medical History  Diagnosis Date  . Hypothyroidism   . Hyperlipidemia   . Fasting hyperglycemia     110 in 01/2011;131 in 05/2007  . Popliteal cyst    Past Surgical History  Procedure Laterality Date  . Cesarean section      G 1 P 2  . Total abdominal hysterectomy w/ bilateral salpingoophorectomy      fibroids; Dr Jennette Kettle  . Total hip arthroplasty  2007    Dr Despina Hick, both hips 3 months apart  . Tonsillectomy and adenoidectomy  1998    R tonsilar malignancy; subsequent LN dissection negative  . Colonoscopy  2003    negative   Family History  Problem Relation Age of Onset  . Hyperlipidemia Mother   . Heart disease Father 83  . Cancer Paternal Grandmother   . Cancer Paternal Grandfather   . Diabetes Neg Hx   . Hypertension Mother   . Ovarian cancer Mother   . Aneurysm Mother     ascending & descending aorta   History  Substance Use Topics  . Smoking status: Never Smoker   . Smokeless tobacco: Not on file  . Alcohol Use: Yes     Comment:  occasional   OB History   Grav Para Term Preterm Abortions TAB SAB Ect Mult Living                 Review of Systems  Musculoskeletal: Positive for arthralgias.  All other systems reviewed and are  negative.    Allergies  Review of patient's allergies indicates no known allergies.  Home Medications   Current Outpatient Rx  Name  Route  Sig  Dispense  Refill  . Cholecalciferol (VITAMIN D-3 PO)   Oral   Take 2,000 Units by mouth daily.         [name] estradiol (ESTRACE) 2 MG tablet   Oral   Take 2 mg by mouth daily.         [name] levothyroxine (SYNTHROID, LEVOTHROID) 50 MCG tablet      1 & 1/2 daily EXCEPT 1 Tues & Sat Office visit due   45 tablet   0   . Multiple Vitamin (MULTIVITAMIN) tablet   Oral   Take 1 tablet by mouth daily. Centrum Silver         . pilocarpine (SALAGEN) 5 MG tablet   Oral   Take 5 mg by mouth 4 (four) times daily.         [name] venlafaxine XR (EFFEXOR-XR) 75 MG 24 hr capsule      TAKE ONE CAPSULE BY MOUTH ONE TIME DAILY   30 capsule   0    BP 131/71  Pulse 69  Temp(Src) 97.4 F (36.3 C) (Oral)  Ht 5\' 9"  (1.753 m)  Wt 153 lb (69.4 kg)  BMI 22.58 kg/m2  SpO2 97% Physical Exam  Constitutional: She is oriented to person, place, and time. She appears well-developed and well-nourished. No distress.  HENT:  Head: Normocephalic and atraumatic.  Right Ear: Hearing normal.  Left Ear: Hearing normal.  Nose: Nose normal.  Mouth/Throat: Oropharynx is clear and moist and mucous membranes are normal.  Eyes: Conjunctivae and EOM are normal. Pupils are equal, round, and reactive to light.  Neck: Normal range of motion. Neck supple.  Cardiovascular: Regular rhythm, S1 normal and S2 normal.  Exam reveals no gallop and no friction rub.   No murmur heard. Pulses:      Posterior tibial pulses are 2+ on the left side.  Pulmonary/Chest: Effort normal and breath sounds normal. No respiratory distress. She exhibits no tenderness.  Abdominal: Soft. Normal appearance and bowel sounds are normal. There is no hepatosplenomegaly. There is no tenderness. There is no rebound, no guarding, no tenderness at McBurney's point and negative Murphy's sign. No hernia.   Musculoskeletal:       Left hip: She exhibits decreased range of motion and deformity.  Neurological: She is alert and oriented to person, place, and time. She has normal strength. No cranial nerve deficit or sensory deficit. Coordination normal. GCS eye subscore is 4. GCS verbal subscore is 5. GCS motor subscore is 6.  Skin: Skin is warm, dry and intact. No rash noted. No cyanosis.  Psychiatric: She has a normal mood and affect. Her speech is normal and behavior is normal. Thought content normal.    ED Course  Procedures (including critical care time)  Procedure: Conscious Sedation Patient was placed on suplemental oxygen as well as continuous cardiac and pulse oximetry monitoring. Patient was administered medications under direct supervision by myself. Excellent sedation was achieved. Patient's vital signs remained stable. The patient was continuously monitored during the recovery phase. The patient tolerated the sedation without complication.  Procedure: Closed reduction right hip Patient was placed in supine position with the hip flexed. Longitudinal traction along the femur was performed by pressure in the popliteal fossa while stabilizing the lower leg. Reduction was palpated and heard. Patient was placed in a knee immobilizer. Postreduction x-rays revealed successful reduction. Patient has normal pulses and sensation distally after the procedure. There were no complications.  Labs Reviewed - No data to display Dg Hip Portable 1 View Right  06/02/2013   *RADIOLOGY REPORT*  Clinical Data: Right hip pain and dislocation  PORTABLE RIGHT HIP - 1 VIEW  Comparison: January 15, 2011  Findings: Total right hip replacement is identified.  The femoral component is dislocated superiorly relative to the acetabular component.  IMPRESSION: Dislocation of the right hip replacement as described.   Original Report Authenticated By: Sherian Rein, M.D.   Diagnosis: Right prosthetic hip dislocation  MDM   Patient presented with right hip pain, sudden in onset after moving the right hip while sitting down. Patient reports that she has had previous dislocation of the same hip approximately a year and a half ago. Patient had x-ray which did show dislocation without fracture. Recommended procedural sedation with closed reduction here in the ER. Case was discussed with Dr. Charlann Boxer. He did agree that the patient could have he reduction by myself here in the ER, followup with Doctor Aluisio in the office in 2 days. Patient to be placed in knee immobilizer until followup.  Patient scope was successfully reduced and she was monitored, is now  awake and alert. She will be discharged as outlined above.  Danielle Crease, MD 06/02/13 2213

## 2013-06-09 ENCOUNTER — Encounter: Payer: Self-pay | Admitting: Internal Medicine

## 2013-06-09 ENCOUNTER — Ambulatory Visit (INDEPENDENT_AMBULATORY_CARE_PROVIDER_SITE_OTHER): Payer: BC Managed Care – PPO | Admitting: Internal Medicine

## 2013-06-09 VITALS — BP 122/84 | HR 83 | Temp 97.7°F | Resp 12 | Ht 69.5 in | Wt 153.0 lb

## 2013-06-09 DIAGNOSIS — E039 Hypothyroidism, unspecified: Secondary | ICD-10-CM

## 2013-06-09 DIAGNOSIS — Z0000 Encounter for general adult medical examination without abnormal findings: Secondary | ICD-10-CM

## 2013-06-09 DIAGNOSIS — M1990 Unspecified osteoarthritis, unspecified site: Secondary | ICD-10-CM

## 2013-06-09 MED ORDER — VENLAFAXINE HCL ER 75 MG PO CP24: 75.0000 mg | ORAL_CAPSULE | Freq: Every day | ORAL | Status: DC

## 2013-06-09 MED ORDER — LEVOTHYROXINE SODIUM 50 MCG PO TABS: ORAL_TABLET | ORAL | Status: DC

## 2013-06-09 NOTE — Patient Instructions (Addendum)

## 2013-06-09 NOTE — Progress Notes (Signed)
  Subjective:    Patient ID: Danielle Pratt, female    DOB: [birth year], 62 y.o.   MRN: [id number]  HPI  She is here for a physical;acute issues recently dislocated hip for which she seen Dr. Despina Hick. This is in the context of having bilateral total hip replacements for end-stage degenerative joint disease.    Review of Systems  She will be due for her colon surveillance; colonoscopy was negative in 2003 by Dr. Marina Goodell. Potentially the hip dislocation could be initiated. BMD normal @ Dr Donnetta Hail 04/2013  At this time she has no abdominal pain, unexplained weight loss, stool change, melena, or rectal bleeding.     Objective:   Physical Exam Gen.: Thin but healthy and well-nourished in appearance. Alert, appropriate and cooperative throughout exam.Appears younger than stated age  Head: Normocephalic without obvious abnormalities  Eyes: No corneal or conjunctival inflammation noted. Pupils equal round reactive to light and accommodation.  Extraocular motion intact. Vision grossly decreased OS even with lenses Ears: External  ear exam reveals no significant lesions or deformities. Canals clear .TMs normal. Hearing is grossly normal bilaterally. Nose: External nasal exam reveals no deformity or inflammation. Nasal mucosa are pink and moist. No lesions or exudates noted.  Mouth: Oral mucosa and oropharynx reveal no lesions or exudates. Teeth in good repair. Neck: No deformities, masses, or tenderness noted. Range of motion & Thyroid normal. Lungs: Normal respiratory effort; chest expands symmetrically. Lungs are clear to auscultation without rales, wheezes, or increased work of breathing. Heart: Normal rate and rhythm. Normal S1 and S2. No gallop, click, or rub. S4 w/o murmur. Abdomen: Bowel sounds normal; abdomen soft and nontender. No masses, organomegaly or hernias noted. Genitalia: As per Gyn                                  Musculoskeletal/extremities: No deformity or scoliosis noted of  the thoracic  or lumbar spine.  No clubbing, cyanosis, edema, or significant extremity  deformity noted. Range of motion normal .Tone & strength  Normal. Joints  reveal minor  PIP &  DIP changes. Nail health good. RLE in soft knee brace Vascular: Carotid, radial artery, dorsalis pedis and  posterior tibial pulses are full and equal. No bruits present. Neurologic: Alert and oriented x3. Deep tendon reflexes symmetrical in UE.          Skin: Intact without suspicious lesions or rashes. Lymph: No cervical, axillary lymphadenopathy present. Psych: Mood and affect are normal. Normally interactive                                                                                        Assessment & Plan:  #1 comprehensive physical exam; no acute findings  Plan: see Orders  & Recommendations

## 2013-06-09 NOTE — Addendum Note (Signed)
Addended by: Maurice Small on: 06/09/2013 03:52 PM   Modules accepted: Orders

## 2013-06-10 LAB — LIPID PANEL
Cholesterol: 265 mg/dL — ABNORMAL HIGH (ref 0–200)
Triglycerides: 127 mg/dL (ref 0.0–149.0)

## 2013-06-10 LAB — CBC WITH DIFFERENTIAL/PLATELET
Eosinophils Relative: 2.6 % (ref 0.0–5.0)
HCT: 41.3 % (ref 36.0–46.0)
Hemoglobin: 13.6 g/dL (ref 12.0–15.0)
Lymphs Abs: 1.1 10*3/uL (ref 0.7–4.0)
MCV: 92.4 fl (ref 78.0–100.0)
Monocytes Absolute: 0.2 10*3/uL (ref 0.1–1.0)
Monocytes Relative: 3.8 % (ref 3.0–12.0)
Neutro Abs: 4.9 10*3/uL (ref 1.4–7.7)
Platelets: 271 10*3/uL (ref 150.0–400.0)
RDW: 13.9 % (ref 11.5–14.6)
WBC: 6.4 10*3/uL (ref 4.5–10.5)

## 2013-06-10 LAB — HEPATIC FUNCTION PANEL
ALT: 10 U/L (ref 0–35)
AST: 17 U/L (ref 0–37)
Albumin: 4.1 g/dL (ref 3.5–5.2)

## 2013-06-10 LAB — BASIC METABOLIC PANEL
BUN: 15 mg/dL (ref 6–23)
Chloride: 100 mEq/L (ref 96–112)
GFR: 65.97 mL/min (ref >60.00)
Glucose, Bld: 78 mg/dL (ref 70–99)
Potassium: 4.3 mEq/L (ref 3.5–5.1)
Sodium: 137 mEq/L (ref 135–145)

## 2013-06-17 ENCOUNTER — Other Ambulatory Visit: Payer: Self-pay | Admitting: *Deleted

## 2013-06-17 DIAGNOSIS — F329 Major depressive disorder, single episode, unspecified: Secondary | ICD-10-CM

## 2013-06-17 MED ORDER — VENLAFAXINE HCL ER 75 MG PO CP24: 75.0000 mg | ORAL_CAPSULE | Freq: Every day | ORAL | Status: DC

## 2013-06-17 NOTE — Telephone Encounter (Signed)
RF for Effexor sent to [facility] on [address]. Pt notified

## 2013-09-07 ENCOUNTER — Encounter: Payer: Self-pay | Admitting: Internal Medicine

## 2013-09-07 ENCOUNTER — Ambulatory Visit (INDEPENDENT_AMBULATORY_CARE_PROVIDER_SITE_OTHER): Payer: BC Managed Care – PPO | Admitting: Internal Medicine

## 2013-09-07 VITALS — BP 120/80 | HR 87 | Wt 154.4 lb

## 2013-09-07 DIAGNOSIS — M7122 Synovial cyst of popliteal space [Baker], left knee: Secondary | ICD-10-CM

## 2013-09-07 DIAGNOSIS — M7120 Synovial cyst of popliteal space [Baker], unspecified knee: Secondary | ICD-10-CM

## 2013-09-07 NOTE — Patient Instructions (Signed)
Your next office appointment will be determined based upon review of your pending imaging. Those instructions will be transmitted to you through My Chart . Please report any significant change in your symptoms.

## 2013-09-07 NOTE — Progress Notes (Signed)
  Subjective:    Patient ID: Danielle Pratt, female    DOB: 1951/08/19, 62 y.o.   MRN: [id number]  HPI  She has noted a recurrence of a popliteal mass on the left several months ago. This was exacerbated after she walked excessively [place]. The major symptoms are of a tightness in this area. She denies specific pain; but she did have some left calf discomfort intermittently. She had a similar mass of years ago which resolved without treatment. PMH of superficial phlebitis LLE post 8 hr drive      Review of Systems there was no associated swelling, redness, or pain in the left knee prior to the exacerbation of her symptoms.  She denies any chest pain, palpitations, dyspnea, or hemoptysis. There's been no definite edema of the left lower extremity.     Objective:   Physical Exam Gen.: Thin but healthy and well-nourished in appearance. Alert, appropriate and cooperative throughout exam.  Eyes: No corneal or conjunctival inflammation noted. No icterus  No neck vein distention at 15 Lungs: Normal respiratory effort; chest expands symmetrically. Lungs are clear to auscultation without rales, wheezes, or increased work of breathing. Heart: Normal rate and rhythm. Normal S1 and S2. No gallop, click, or rub. No murmur. Abdomen: Bowel sounds normal; abdomen soft and nontender. No masses, organomegaly or hernias noted. No hepatojugular reflux                               Musculoskeletal/extremities:  No clubbing, cyanosis, edema, or significant extremity  deformity noted. Range of motion normal .Tone & strength  Normal. Joints normal. No enlargement or effusion of the knees. Nail health good. Able to lie down & sit up w/o help. Negative SLR bilaterally. There is a palpable cystic mass in the left popliteal space medially and inferiorly. Homans sign is negative bilaterally. Vascular: Carotid, radial artery, dorsalis pedis and  posterior tibial pulses are full and  equal. No bruits present. Neurologic: Alert and oriented x3. Deep tendon reflexes symmetrical and normal.         Skin: Intact without suspicious lesions or rashes. Psych: Mood and affect are normal. Normally interactive                                                                                        Assessment & Plan:  #1 large left popliteal cyst  Plan: Ultrasound.

## 2013-09-09 ENCOUNTER — Ambulatory Visit (HOSPITAL_COMMUNITY): Payer: BC Managed Care – PPO | Attending: Internal Medicine

## 2013-09-09 DIAGNOSIS — M7122 Synovial cyst of popliteal space [Baker], left knee: Secondary | ICD-10-CM

## 2013-09-09 DIAGNOSIS — E785 Hyperlipidemia, unspecified: Secondary | ICD-10-CM | POA: Insufficient documentation

## 2013-09-09 DIAGNOSIS — R229 Localized swelling, mass and lump, unspecified: Secondary | ICD-10-CM | POA: Insufficient documentation

## 2013-09-10 ENCOUNTER — Encounter: Payer: Self-pay | Admitting: *Deleted

## 2013-09-15 ENCOUNTER — Encounter: Payer: Self-pay | Admitting: *Deleted

## 2014-03-10 ENCOUNTER — Ambulatory Visit (INDEPENDENT_AMBULATORY_CARE_PROVIDER_SITE_OTHER): Payer: BC Managed Care – PPO | Admitting: Family Medicine

## 2014-03-10 ENCOUNTER — Encounter: Payer: Self-pay | Admitting: Family Medicine

## 2014-03-10 VITALS — BP 110/78 | HR 100 | Temp 97.9°F | Ht 69.5 in | Wt 156.5 lb

## 2014-03-10 DIAGNOSIS — J069 Acute upper respiratory infection, unspecified: Secondary | ICD-10-CM

## 2014-03-10 NOTE — Progress Notes (Signed)
No chief complaint on file.   HPI:  -started: 1 week ago -symptoms:nasal congestion, sore throat, cough, wheezed once - getting better coughing still some - better then it was -denies:fever, SOB, NVD, tooth pain -has tried: musinex which helps -sick contacts/travel/risks: family members with the same  ROS: See pertinent positives and negatives per HPI.  Past Medical History  Diagnosis Date  . Hypothyroidism   . Hyperlipidemia   . Fasting hyperglycemia     110 in 01/2011;131 in 05/2007  . Popliteal cyst     Past Surgical History  Procedure Laterality Date  . Cesarean section      G 1 P 2  . Total abdominal hysterectomy w/ bilateral salpingoophorectomy      fibroids; Dr Nori Riis  . Total hip arthroplasty  2007    Dr Maureen Ralphs, both hips 3 months apart  . Tonsillectomy and adenoidectomy  1998    R tonsilar malignancy; subsequent LN dissection negative  . Colonoscopy  2003    negative; Dr Henrene Pastor    Family History  Problem Relation Age of Onset  . Hyperlipidemia Mother   . Hypertension Mother   . Ovarian cancer Mother   . Aneurysm Mother     ascending & descending aorta  . Heart attack Father 61  . Leukemia Paternal Grandmother   . Lung cancer Paternal Grandfather   . Diabetes Neg Hx   . Stroke Neg Hx     History   Social History  . Marital Status: Married    Spouse Name: N/A    Number of Children: N/A  . Years of Education: N/A   Social History Main Topics  . Smoking status: Never Smoker   . Smokeless tobacco: None  . Alcohol Use: Yes     Comment:  occasionally  . Drug Use: No  . Sexual Activity: None   Other Topics Concern  . None   Social History Narrative  . None    Current outpatient prescriptions:Cholecalciferol (VITAMIN D-3 PO), Take 2,000 Units by mouth daily., Disp: , Rfl: ;  estradiol (ESTRACE) 2 MG tablet, Take 2 mg by mouth at bedtime. , Disp: , Rfl: ;  levothyroxine (SYNTHROID, LEVOTHROID) 50 MCG tablet, 1.5 tabs daily except for 1 tab on  Tuesdays and Saturdays., Disp: 90 tablet, Rfl: 4;  pilocarpine (SALAGEN) 5 MG tablet, Take 5 mg by mouth 4 (four) times daily., Disp: , Rfl:  tretinoin (RETIN-A) 0.05 % cream, Apply topically as needed. Rx'ed by Dermatologist, Disp: , Rfl: ;  venlafaxine XR (EFFEXOR-XR) 75 MG 24 hr capsule, Take 1 capsule (75 mg total) by mouth at bedtime., Disp: 90 capsule, Rfl: 3  EXAM:  Filed Vitals:   03/10/14 1415  BP: 110/78  Pulse: 100  Temp: 97.9 F (36.6 C)    Body mass index is 22.79 kg/(m^2).  GENERAL: vitals reviewed and listed above, alert, oriented, appears well hydrated and in no acute distress  HEENT: atraumatic, conjunttiva clear, no obvious abnormalities on inspection of external nose and ears, normal appearance of ear canals and TMs, clear nasal congestion, mild post oropharyngeal erythema with PND, no tonsillar edema or exudate, no sinus TTP  NECK: no obvious masses on inspection  LUNGS: clear to auscultation bilaterally, no wheezes, rales or rhonchi, good air movement  CV: HRRR, no peripheral edema  MS: moves all extremities without noticeable abnormality  PSYCH: pleasant and cooperative, no obvious depression or anxiety  ASSESSMENT AND PLAN:  Discussed the following assessment and plan:  Viral upper respiratory illness  -  given HPI and exam findings today, a serious infection or illness is unlikely. We discussed potential etiologies, with VURI being most likely, and advised supportive care and monitoring. We discussed treatment side effects, likely course, antibiotic misuse, transmission, and signs of developing a serious illness. -of course, we advised to return or notify a doctor immediately if symptoms worsen or persist or new concerns arise.    There are no Patient Instructions on file for this visit.   Danielle Pratt

## 2014-03-10 NOTE — Progress Notes (Signed)
Pre visit review using our clinic review tool, if applicable. No additional management support is needed unless otherwise documented below in the visit note. 

## 2014-06-05 ENCOUNTER — Other Ambulatory Visit: Payer: Self-pay | Admitting: Internal Medicine

## 2014-07-19 ENCOUNTER — Other Ambulatory Visit: Payer: Self-pay

## 2014-07-19 DIAGNOSIS — E039 Hypothyroidism, unspecified: Secondary | ICD-10-CM

## 2014-07-19 MED ORDER — LEVOTHYROXINE SODIUM 50 MCG PO TABS: ORAL_TABLET | ORAL | Status: DC

## 2014-09-05 ENCOUNTER — Encounter: Payer: Self-pay | Admitting: Internal Medicine

## 2014-09-05 ENCOUNTER — Ambulatory Visit (INDEPENDENT_AMBULATORY_CARE_PROVIDER_SITE_OTHER): Payer: BC Managed Care – PPO | Admitting: Internal Medicine

## 2014-09-05 VITALS — BP 140/100 | HR 75 | Temp 97.5°F | Resp 12 | Wt 157.1 lb

## 2014-09-05 DIAGNOSIS — Z23 Encounter for immunization: Secondary | ICD-10-CM

## 2014-09-05 DIAGNOSIS — Q279 Congenital malformation of peripheral vascular system, unspecified: Secondary | ICD-10-CM

## 2014-09-05 NOTE — Progress Notes (Signed)
   Subjective:    Patient ID: Danielle Pratt, female    DOB: 11-Aug-1951, 63 y.o.   MRN: [id number]  HPI    Last week she noted a "lump" over the base of the right neck above the clavicle. There were no associated skin, color,or temperature changes.  She's had minor rhinitis symptoms.  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  She has no hoarseness or dysphasia       Objective:   Physical Exam    There are ischemic type right posterior pharyngeal changes related to previous surgery. There is asymmetry of the posterior pharynx. The left pharynx is lower than the right There is some loss of subcutaneous tissues of the right neck There is a vertical vascular structure with faint pulse above the R clavicle. This does transilluminate. She has no lymphadenopathy about the neck or axilla. She has a regular rhythm without murmur gallop Chest is clear with no increased work of breathing Abdomen reveals no organomegaly or masses.        Assessment & Plan:  #1 neck mass is vascular in etiology.  Plan: She was reassured. She will take monthly  photographs with her I phone. If the lesion does increase in size or change in color temperature; ultrasound will be pursued.

## 2014-09-05 NOTE — Progress Notes (Signed)
   Subjective:    Patient ID: Danielle Pratt, female    DOB: January 28, 1951, 63 y.o.   MRN: [id number]  HPI  Patient is here today for evaluation of lump on neck.    She initially noticed it last week.  It is located at the base of her right anterior neck above clavicle.  She has not had warmth, redness, drainage, tenderness.  She has had some clear rhinorrhea and postnasal drip.   She has a history of tonsil cancer and is s/p tonsillectomy and radiation to the right side of her neck.  She has travelled extensively since August involving lots of air travel.   Her blood pressure is elevated today.  On repeat 138/88.  She is not avoiding salt and has not been exercising as much as usual lately.  She does not check her blood pressure at home.  She has had no CV symptoms.    Review of Systems     Objective:   Physical Exam BP recheck 138/88       Assessment & Plan:

## 2014-09-05 NOTE — Progress Notes (Signed)
Pre visit review using our clinic review tool, if applicable. No additional management support is needed unless otherwise documented below in the visit note. 

## 2014-09-05 NOTE — Patient Instructions (Signed)
Use your cell phone camera to monitor  the vascular anomaly. Take a photo  month with a small ruler immediately beside the lesion to define any change in size, shape or color.

## 2014-11-26 ENCOUNTER — Other Ambulatory Visit: Payer: Self-pay | Admitting: Internal Medicine

## 2015-02-16 ENCOUNTER — Telehealth: Payer: Self-pay | Admitting: Internal Medicine

## 2015-02-16 NOTE — Telephone Encounter (Signed)
Phone call to patient. I spoke to her husband since she was driving and husband ended up speaking to Dr Linna Darner. Dr Linna Darner requested that patient obtain previous note from Dr Jarome Matin so he can read the note to obtain a diagnoses so the referral can be placed.

## 2015-02-16 NOTE — Telephone Encounter (Signed)
Patient needs a referral for Dr. Jarome Matin at [facility]. [phone]

## 2015-02-16 NOTE — Telephone Encounter (Signed)
Unfortunately her insurance will require Dx to authorize referral

## 2015-04-17 ENCOUNTER — Other Ambulatory Visit: Payer: Self-pay | Admitting: Internal Medicine

## 2015-05-02 ENCOUNTER — Other Ambulatory Visit: Payer: Self-pay | Admitting: Internal Medicine

## 2015-05-02 ENCOUNTER — Telehealth: Payer: Self-pay | Admitting: Internal Medicine

## 2015-05-02 DIAGNOSIS — S73004A Unspecified dislocation of right hip, initial encounter: Secondary | ICD-10-CM

## 2015-05-02 NOTE — Telephone Encounter (Signed)
[place] from  [place] called stated that patient need a compass referral she dislocated her right hip, she is in the office as we speak. Please advise Phone [phone] ex [phone]

## 2015-05-02 NOTE — Telephone Encounter (Signed)
Referral #[id number]  start date 05/02/15, exp 11/01/15 good for 6 visits to Dr Wynelle Link at [facility]

## 2015-05-02 NOTE — Telephone Encounter (Signed)
Please advise 

## 2015-05-08 ENCOUNTER — Telehealth: Payer: Self-pay | Admitting: Internal Medicine

## 2015-05-08 ENCOUNTER — Other Ambulatory Visit: Payer: Self-pay | Admitting: Internal Medicine

## 2015-05-08 ENCOUNTER — Other Ambulatory Visit: Payer: Self-pay | Admitting: Obstetrics & Gynecology

## 2015-05-08 DIAGNOSIS — R928 Other abnormal and inconclusive findings on diagnostic imaging of breast: Secondary | ICD-10-CM

## 2015-05-08 NOTE — Telephone Encounter (Signed)
Pt request referral put in for diagnostic mammogram left breast at 5 o'clock and ultrasound to be send to breast center. Please put in referral for pt

## 2015-05-08 NOTE — Telephone Encounter (Signed)
Patient need a referral to her GYN Doctor Dory Horn, Phone # [phone] her appt is at 2:00 today

## 2015-05-09 ENCOUNTER — Other Ambulatory Visit: Payer: Self-pay | Admitting: Internal Medicine

## 2015-05-09 DIAGNOSIS — N6323 Unspecified lump in the left breast, lower outer quadrant: Secondary | ICD-10-CM

## 2015-05-09 DIAGNOSIS — Z01419 Encounter for gynecological examination (general) (routine) without abnormal findings: Secondary | ICD-10-CM

## 2015-05-09 LAB — CYTOLOGY - PAP

## 2015-05-09 NOTE — Telephone Encounter (Signed)
This was sent to the wrong [facility].   Danielle Raven,  Do you know if the pt was able to see her gyn yesterday?

## 2015-05-09 NOTE — Telephone Encounter (Signed)
Referral sent to Ortho 6/21 and to Breast Center yesterday

## 2015-05-11 ENCOUNTER — Ambulatory Visit
Admission: RE | Admit: 2015-05-11 | Discharge: 2015-05-11 | Disposition: A | Discharge status: Home / Self Care | Payer: 59 | Source: Ambulatory Visit | Attending: Internal Medicine | Admitting: Internal Medicine

## 2015-05-11 DIAGNOSIS — N6323 Unspecified lump in the left breast, lower outer quadrant: Secondary | ICD-10-CM

## 2015-05-11 NOTE — Telephone Encounter (Signed)
LVM stating referrals have been sent in to [facility] and GYN

## 2015-06-26 ENCOUNTER — Telehealth: Payer: Self-pay | Admitting: Emergency Medicine

## 2015-06-26 MED ORDER — VENLAFAXINE HCL ER 75 MG PO CP24: 75.0000 mg | ORAL_CAPSULE | Freq: Every day | ORAL | Status: DC

## 2015-06-26 NOTE — Telephone Encounter (Signed)
Refill request for Venlafaxine, last OV 10/15, last refill 1/16 #90 with one refill. Please advise

## 2015-06-26 NOTE — Telephone Encounter (Signed)
1 month supply provided - will need office visit prior to next refill.

## 2015-06-30 ENCOUNTER — Telehealth: Payer: Self-pay | Admitting: *Deleted

## 2015-06-30 NOTE — Telephone Encounter (Signed)
Left msg on triage Thursday afternoon stating pharmacy has sent md a request on her venaflaxine week ago, and they still haven't received back. Only have 1 pill left requesting refill to be sent. Called pt back no answer LMOM per chart no request has been sent need to know which [place] she want med to go to...Johny Chess

## 2015-06-30 NOTE — Telephone Encounter (Signed)
See previous msg [name] has approve request for 1 month. Closing this msg...Danielle Pratt

## 2015-07-03 ENCOUNTER — Telehealth: Payer: Self-pay | Admitting: *Deleted

## 2015-07-03 MED ORDER — VENLAFAXINE HCL ER 75 MG PO CP24: 75.0000 mg | ORAL_CAPSULE | Freq: Every day | ORAL | Status: DC

## 2015-07-03 NOTE — Telephone Encounter (Signed)
Pt called back Friday afternoon requesting effexor to be sent to [facility] on [address]. Resent to requested pharmacy...Johny Chess

## 2015-07-28 ENCOUNTER — Encounter: Payer: Self-pay | Admitting: Internal Medicine

## 2015-07-28 ENCOUNTER — Other Ambulatory Visit (INDEPENDENT_AMBULATORY_CARE_PROVIDER_SITE_OTHER): Payer: 59

## 2015-07-28 ENCOUNTER — Telehealth: Payer: Self-pay | Admitting: Internal Medicine

## 2015-07-28 ENCOUNTER — Ambulatory Visit (INDEPENDENT_AMBULATORY_CARE_PROVIDER_SITE_OTHER): Payer: 59 | Admitting: Internal Medicine

## 2015-07-28 VITALS — BP 134/86 | HR 84 | Temp 97.7°F | Resp 16 | Ht 69.0 in | Wt 149.0 lb

## 2015-07-28 DIAGNOSIS — Z23 Encounter for immunization: Secondary | ICD-10-CM | POA: Diagnosis not present

## 2015-07-28 DIAGNOSIS — Z0000 Encounter for general adult medical examination without abnormal findings: Secondary | ICD-10-CM | POA: Diagnosis not present

## 2015-07-28 DIAGNOSIS — Z1211 Encounter for screening for malignant neoplasm of colon: Secondary | ICD-10-CM

## 2015-07-28 DIAGNOSIS — Z0189 Encounter for other specified special examinations: Secondary | ICD-10-CM | POA: Diagnosis not present

## 2015-07-28 DIAGNOSIS — D229 Melanocytic nevi, unspecified: Secondary | ICD-10-CM | POA: Insufficient documentation

## 2015-07-28 DIAGNOSIS — H6982 Other specified disorders of Eustachian tube, left ear: Secondary | ICD-10-CM | POA: Diagnosis not present

## 2015-07-28 LAB — CBC WITH DIFFERENTIAL/PLATELET
BASOS PCT: 0.4 % (ref 0.0–3.0)
Basophils Absolute: 0 10*3/uL (ref 0.0–0.1)
Eosinophils Absolute: 0.1 10*3/uL (ref 0.0–0.7)
Eosinophils Relative: 1.4 % (ref 0.0–5.0)
HCT: 41.2 % (ref 36.0–46.0)
Hemoglobin: 13.9 g/dL (ref 12.0–15.0)
LYMPHS ABS: 0.9 10*3/uL (ref 0.7–4.0)
Lymphocytes Relative: 15.5 % (ref 12.0–46.0)
MCHC: 33.7 g/dL (ref 30.0–36.0)
MCV: 90 fl (ref 78.0–100.0)
MONO ABS: 0.3 10*3/uL (ref 0.1–1.0)
MONOS PCT: 4.7 % (ref 3.0–12.0)
Neutro Abs: 4.5 10*3/uL (ref 1.4–7.7)
Neutrophils Relative %: 78 % — ABNORMAL HIGH (ref 43.0–77.0)
PLATELETS: 267 10*3/uL (ref 150.0–400.0)
RBC: 4.58 Mil/uL (ref 3.87–5.11)
RDW: 13.5 % (ref 11.5–15.5)
WBC: 5.8 10*3/uL (ref 4.0–10.5)

## 2015-07-28 LAB — HEPATIC FUNCTION PANEL
ALBUMIN: 4 g/dL (ref 3.5–5.2)
ALT: 10 U/L (ref 0–35)
AST: 14 U/L (ref 0–37)
Alkaline Phosphatase: 62 U/L (ref 39–117)
BILIRUBIN TOTAL: 0.4 mg/dL (ref 0.2–1.2)
Bilirubin, Direct: 0.1 mg/dL (ref 0.0–0.3)
Total Protein: 7 g/dL (ref 6.0–8.3)

## 2015-07-28 LAB — TSH: TSH: 3.21 u[IU]/mL (ref 0.35–4.50)

## 2015-07-28 LAB — BASIC METABOLIC PANEL
BUN: 16 mg/dL (ref 6–23)
CO2: 30 mEq/L (ref 19–32)
Calcium: 9.2 mg/dL (ref 8.4–10.5)
Chloride: 100 mEq/L (ref 96–112)
Creatinine, Ser: 0.96 mg/dL (ref 0.40–1.20)
GFR: 62.37 mL/min (ref >60.00)
GLUCOSE: 88 mg/dL (ref 70–99)
POTASSIUM: 4.6 meq/L (ref 3.5–5.1)
SODIUM: 137 meq/L (ref 135–145)

## 2015-07-28 LAB — LIPID PANEL
CHOLESTEROL: 215 mg/dL — AB (ref 0–200)
HDL: 82.9 mg/dL (ref >39.00)
LDL Cholesterol: 115 mg/dL — ABNORMAL HIGH (ref 0–99)
NonHDL: 132.43
Total CHOL/HDL Ratio: 3
Triglycerides: 85 mg/dL (ref 0.0–149.0)
VLDL: 17 mg/dL (ref 0.0–40.0)

## 2015-07-28 MED ORDER — VENLAFAXINE HCL ER 75 MG PO CP24: 75.0000 mg | ORAL_CAPSULE | Freq: Every day | ORAL | Status: DC

## 2015-07-28 MED ORDER — LEVOTHYROXINE SODIUM 50 MCG PO TABS: ORAL_TABLET | ORAL | Status: DC

## 2015-07-28 NOTE — Patient Instructions (Signed)
Go to [facility] for eustachian tube dysfunction. Drink thin  fluids liberally through the day . Do the Valsalva maneuver several times a day to "pop" ears open. Flonase or Nasacort AQ 1 spray in left nostril twice a day as needed. Use the "crossover" technique as discussed. .   As per the Standard of Care , screening Colonoscopy recommended @ 50 & every 5-10 years thereafter . More frequent monitor would be dictated by family history or findings @ Colonoscopy. Please let me schedule this important test as prevention.  Your next office appointment will be determined based upon review of your pending labs. Those written interpretation of the lab results and instructions will be transmitted to you by mail for your records. Critical results will be called. Followup as needed for any active or acute issue. Please report any significant change in your symptoms.

## 2015-07-28 NOTE — Telephone Encounter (Signed)
Pt came back in after her CPE with you this morning and is requesting a referral to Dr. Henrene Pastor for a colonoscopy.  Please put in a referral for that.  Pt has Psychologist, occupational for insurance.

## 2015-07-28 NOTE — Telephone Encounter (Signed)
done

## 2015-07-28 NOTE — Progress Notes (Signed)
Pre visit review using our clinic review tool, if applicable. No additional management support is needed unless otherwise documented below in the visit note. 

## 2015-07-28 NOTE — Progress Notes (Signed)
   Subjective:    Patient ID: Danielle Pratt, female    DOB: [birth year], 64 y.o.   MRN: [id number]  HPI The patient is here for a physical to assess status of active health conditions.  PMH, FH, & Social History reviewed & updated.No change in [place] as recorded.  She is on a heart healthy diet; she does not add salt at the table. She is not exercising; she denies any active cardiopulmonary symptoms.  Colonoscopy is several years overdue. She has no active GI symptoms  She does have intermittent pressure in the left ear; this has been present for a month. There is no associated tinnitus or hearing loss.  Review of Systems Chest pain, palpitations, tachycardia, exertional dyspnea, paroxysmal nocturnal dyspnea, claudication or edema are absent. No unexplained weight loss, abdominal pain, significant dyspepsia, dysphagia, melena, rectal bleeding, or persistently small caliber stools. Dysuria, pyuria, hematuria, frequency, nocturia or polyuria are denied. Change in hair, skin, nails denied. No bowel changes of constipation or diarrhea. No intolerance to heat or cold.      Objective:   Physical Exam  Pertinent or positive findings include: She has an osteoma of the hard palate. Ptosis is present bilaterally. With mastication maneuvers the mandible deviates to the left visibly. She has a 9 x 10 mm nevus of the left forearm which is bicolored. She also has smaller nevi over the left anterior chest. She has DIP osteoarthritic changes of the hands. She has slight crepitus of the knees. There is slight subcutaneous thickening of the ventral arch of the left foot. Arches are high. Dorsalis pedis pulses are decreased.  General appearance :Thin but adequately nourished; in no distress.  Eyes: No conjunctival inflammation or scleral icterus is present.  Oral exam:  Lips and gums are healthy appearing.There is no oropharyngeal erythema or exudate noted. Dental hygiene is good.  Heart:  Normal rate and  regular rhythm. S1 and S2 normal without gallop, murmur, click, rub or other extra sounds    Lungs:Chest clear to auscultation; no wheezes, rhonchi,rales ,or rubs present.No increased work of breathing.   Abdomen: bowel sounds normal, soft and non-tender without masses, organomegaly or hernias noted.  No guarding or rebound.   Vascular : all pulses equal ; no bruits present.  Skin:Warm & dry.  Intact without suspicious lesions or rashes ; no tenting or jaundice   Lymphatic: No lymphadenopathy is noted about the head, neck, axilla.   Neuro: Strength, tone & DTRs normal.     Assessment & Plan:  #1 comprehensive physical exam  #2 left eustachian tube dysfunction  #3 left TMJ dislocation    Plan: see Orders  & Recommendations

## 2015-08-28 ENCOUNTER — Encounter: Payer: Self-pay | Admitting: Internal Medicine

## 2015-09-06 ENCOUNTER — Encounter: Payer: Self-pay | Admitting: Internal Medicine

## 2015-10-27 ENCOUNTER — Ambulatory Visit (AMBULATORY_SURGERY_CENTER): Payer: Self-pay | Admitting: *Deleted

## 2015-10-27 VITALS — Ht 69.0 in | Wt 154.0 lb

## 2015-10-27 DIAGNOSIS — Z1211 Encounter for screening for malignant neoplasm of colon: Secondary | ICD-10-CM

## 2015-10-27 MED ORDER — NA SULFATE-K SULFATE-MG SULF 17.5-3.13-1.6 GM/177ML PO SOLN: 1.0000 | Freq: Once | ORAL | Status: DC

## 2015-10-27 NOTE — Progress Notes (Signed)
No egg or soy allergy. No anesthesia problems.  No home O2.  No diet meds.  

## 2015-10-30 ENCOUNTER — Encounter: Payer: Self-pay | Admitting: Internal Medicine

## 2015-11-10 ENCOUNTER — Ambulatory Visit (AMBULATORY_SURGERY_CENTER): Payer: 59 | Admitting: Internal Medicine

## 2015-11-10 ENCOUNTER — Encounter: Payer: Self-pay | Admitting: Internal Medicine

## 2015-11-10 VITALS — BP 115/57 | HR 74 | Temp 97.9°F | Resp 15 | Ht 69.0 in | Wt 154.0 lb

## 2015-11-10 DIAGNOSIS — Z1211 Encounter for screening for malignant neoplasm of colon: Secondary | ICD-10-CM | POA: Diagnosis not present

## 2015-11-10 DIAGNOSIS — D125 Benign neoplasm of sigmoid colon: Secondary | ICD-10-CM | POA: Diagnosis not present

## 2015-11-10 MED ORDER — SODIUM CHLORIDE 0.9 % IV SOLN: 500.0000 mL | INTRAVENOUS | Status: DC

## 2015-11-10 NOTE — Op Note (Signed)
[facility]  [address] [place], [id number]   COLONOSCOPY PROCEDURE REPORT  PATIENT: Danielle Pratt, Bruder  MR#: [id number] BIRTHDATE: February 05, 1951 , 68  yrs. old GENDER: female ENDOSCOPIST: Eustace Quail, MD REFERRED [id number] Recall, M.D. PROCEDURE DATE:  11/10/2015 PROCEDURE:   Colonoscopy, screening and Colonoscopy with snare polypectomy x 1 First Screening Colonoscopy - Avg.  risk and is 50 yrs.  old or older - No.  Prior Negative Screening - Now for repeat screening. 10 or more years since last screening  History of Adenoma - Now for follow-up colonoscopy & has been > or = to 3 yrs.  N/A  Polyps removed today? Yes ASA CLASS:   Class II INDICATIONS:Screening for colonic neoplasia and Colorectal Neoplasm Risk Assessment for this procedure is average risk.  . Normal index examination March 2006 MEDICATIONS: Propofol 300 mg IV and Monitored anesthesia care  DESCRIPTION OF PROCEDURE:   After the risks benefits and alternatives of the procedure were thoroughly explained, informed consent was obtained.  The digital rectal exam revealed no abnormalities of the rectum.   The LB [id number]  endoscope was introduced through the anus and advanced to the cecum, which was identified by both the appendix and ileocecal valve. No adverse events experienced.   The quality of the prep was excellent. (Suprep was used)  The instrument was then slowly withdrawn as the colon was fully examined. Estimated blood loss is zero unless otherwise noted in this procedure report.   COLON FINDINGS: A single polyp measuring 5 mm in size was found in the sigmoid colon.  A polypectomy was performed with a cold snare. The resection was complete, the polyp tissue was completely retrieved and sent to histology.   The examination was otherwise normal.  Retroflexed views revealed no abnormalities. The time to cecum = 7.1 Withdrawal time = 8.7   The scope was withdrawn and the procedure  completed. COMPLICATIONS: There were no immediate complications.  ENDOSCOPIC IMPRESSION: 1.   Single polyp was found in the sigmoid colon; polypectomy was performed with a cold snare 2.   The examination was otherwise normal  RECOMMENDATIONS: 1. Repeat colonoscopy in 5 years if polyp adenomatous; otherwise 10 years  eSigned:  Eustace Quail, MD 11/10/2015 1:47 PM   cc: The Patient and Hendricks Limes, MD

## 2015-11-10 NOTE — Patient Instructions (Signed)
YOU HAD AN ENDOSCOPIC PROCEDURE TODAY AT THE [facility]:   Refer to the procedure report that was given to you for any specific questions about what was found during the examination.  If the procedure report does not answer your questions, please call your gastroenterologist to clarify.  If you requested that your care partner not be given the details of your procedure findings, then the procedure report has been included in a sealed envelope for you to review at your convenience later.  YOU SHOULD EXPECT: Some feelings of bloating in the abdomen. Passage of more gas than usual.  Walking can help get rid of the air that was put into your GI tract during the procedure and reduce the bloating. If you had a lower endoscopy (such as a colonoscopy or flexible sigmoidoscopy) you may notice spotting of blood in your stool or on the toilet paper. If you underwent a bowel prep for your procedure, you may not have a normal bowel movement for a few days.  Please Note:  You might notice some irritation and congestion in your nose or some drainage.  This is from the oxygen used during your procedure.  There is no need for concern and it should clear up in a day or so.  SYMPTOMS TO REPORT IMMEDIATELY:   Following lower endoscopy (colonoscopy or flexible sigmoidoscopy):  Excessive amounts of blood in the stool  Significant tenderness or worsening of abdominal pains  Swelling of the abdomen that is new, acute  Fever of 100F or higher   For urgent or emergent issues, a gastroenterologist can be reached at any hour by calling [phone].   DIET: Your first meal following the procedure should be a small meal and then it is ok to progress to your normal diet. Heavy or fried foods are harder to digest and may make you feel nauseous or bloated.  Likewise, meals heavy in dairy and vegetables can increase bloating.  Drink plenty of fluids but you should avoid alcoholic beverages for 24  hours.  ACTIVITY:  You should plan to take it easy for the rest of today and you should NOT DRIVE or use heavy machinery until tomorrow (because of the sedation medicines used during the test).    FOLLOW UP: Our staff will call the number listed on your records the next business day following your procedure to check on you and address any questions or concerns that you may have regarding the information given to you following your procedure. If we do not reach you, we will leave a message.  However, if you are feeling well and you are not experiencing any problems, there is no need to return our call.  We will assume that you have returned to your regular daily activities without incident.  If any biopsies were taken you will be contacted by phone or by letter within the next 1-3 weeks.  Please call us at [phone] if you have not heard about the biopsies in 3 weeks.    SIGNATURES/CONFIDENTIALITY: You and/or your care partner have signed paperwork which will be entered into your electronic medical record.  These signatures attest to the fact that that the information above on your After Visit Summary has been reviewed and is understood.  Full responsibility of the confidentiality of this discharge information lies with you and/or your care-partner.  Polyp-handout given  Repeat colonoscopy will be determined by pathology.   

## 2015-11-10 NOTE — Progress Notes (Signed)
To recovery, report to [place], RN, VSS. 

## 2015-11-10 NOTE — Progress Notes (Signed)
Called to room to assist during endoscopic procedure.  Patient ID and intended procedure confirmed with present staff. Received instructions for my participation in the procedure from the performing physician.  

## 2015-11-14 ENCOUNTER — Telehealth: Payer: Self-pay | Admitting: *Deleted

## 2015-11-14 NOTE — Telephone Encounter (Signed)
No answer. Number identifier. Message left to call if questions or concerns. 

## 2015-11-16 ENCOUNTER — Encounter: Payer: Self-pay | Admitting: Internal Medicine

## 2015-12-20 ENCOUNTER — Ambulatory Visit (INDEPENDENT_AMBULATORY_CARE_PROVIDER_SITE_OTHER): Payer: BLUE CROSS/BLUE SHIELD | Admitting: Nurse Practitioner

## 2015-12-20 ENCOUNTER — Encounter: Payer: Self-pay | Admitting: Nurse Practitioner

## 2015-12-20 VITALS — BP 120/84 | HR 78 | Temp 98.0°F | Ht 69.0 in | Wt 153.8 lb

## 2015-12-20 DIAGNOSIS — J0140 Acute pansinusitis, unspecified: Secondary | ICD-10-CM | POA: Diagnosis not present

## 2015-12-20 DIAGNOSIS — J019 Acute sinusitis, unspecified: Secondary | ICD-10-CM | POA: Insufficient documentation

## 2015-12-20 MED ORDER — AMOXICILLIN-POT CLAVULANATE 875-125 MG PO TABS: 1.0000 | ORAL_TABLET | Freq: Two times a day (BID) | ORAL | Status: DC

## 2015-12-20 NOTE — Progress Notes (Signed)
Patient ID: Danielle Pratt, female    DOB: 05-31-1951  Age: 65 y.o. MRN: [id number]  CC: Sinusitis and Ear Fullness   HPI [name] presents for Sinusitis x 3 days.   1) Pt reports severe pain over right side of face and in teeth of right side. Pain below right eye has worsened since last night.  Right side ear pressure/fullness. Sees dentist regularly  Bad taste in mouth  Denies tooth pain with brushing   Sick contacts: Denies   Treatment to date: Claritin-D- helpful somewhat Ibuprofen- Helpful   History Danielle Pratt has a past medical history of Hypothyroidism; Hyperlipidemia; Fasting hyperglycemia; Popliteal cyst; Anxiety; Cancer ([place]); and Cataract.   She has past surgical history that includes Cesarean section; Total abdominal hysterectomy w/ bilateral salpingoophorectomy; Total hip arthroplasty (2007); Tonsillectomy and adenoidectomy (1998); Colonoscopy (2003); and Lymphadenectomy (Right).   Her family history includes Aneurysm in her mother; Heart attack (age of onset: 69) in her father; Hyperlipidemia in her mother; Hypertension in her mother; Leukemia in her paternal grandmother; Lung cancer in her paternal grandfather; Ovarian cancer in her mother. There is no history of Diabetes, Stroke, Colon cancer, Stomach cancer, or Rectal cancer.She reports that she has never smoked. She has never used smokeless tobacco. She reports that she drinks alcohol. She reports that she does not use illicit drugs.  Outpatient Prescriptions Prior to Visit  Medication Sig Dispense Refill  . Cholecalciferol (VITAMIN D-3 PO) Take 2,000 Units by mouth daily.    [name] estradiol (ESTRACE) 2 MG tablet Take 2 mg by mouth at bedtime.     [name] levothyroxine (SYNTHROID, LEVOTHROID) 50 MCG tablet TAKE ONE AND ONE-HALF TABLETS BY MOUTH DAILY EXCEPT ON TUESDAYS AND SATURDAYS TAKE ONE TABLET 90 tablet 3  . pilocarpine (SALAGEN) 5 MG tablet Take 5 mg by mouth 4 (four) times daily.    [name] tretinoin (RETIN-A) 0.05 % cream  Apply topically as needed. Rx'ed by Dermatologist    . venlafaxine XR (EFFEXOR-XR) 75 MG 24 hr capsule Take 1 capsule (75 mg total) by mouth at bedtime. 90 capsule 1   No facility-administered medications prior to visit.    ROS Review of Systems  Constitutional: Negative for fever, chills, diaphoresis and fatigue.  HENT: Positive for congestion, ear pain and sinus pressure. Negative for dental problem and ear discharge.   Respiratory: Negative for chest tightness, shortness of breath and wheezing.   Cardiovascular: Negative for chest pain, palpitations and leg swelling.  Gastrointestinal: Negative for nausea, vomiting and diarrhea.  Skin: Negative for rash.  Neurological: Negative for dizziness.    Objective:  BP 120/84 mmHg  Pulse 78  Temp(Src) 98 F (36.7 C) (Oral)  Ht 5\' 9"  (1.753 m)  Wt 153 lb 12 oz (69.741 kg)  BMI 22.69 kg/m2  SpO2 94%  Physical Exam  Constitutional: She is oriented to person, place, and time. She appears well-developed and well-nourished. No distress.  HENT:  Head: Normocephalic and atraumatic.  Right Ear: External ear normal.  Left Ear: External ear normal.  Mouth/Throat: Oropharynx is clear and moist.  No sign of tooth abscess or concern on right upper jaw  TM's clear bilaterally  Eyes: EOM are normal. Pupils are equal, round, and reactive to light. Right eye exhibits no discharge. Left eye exhibits no discharge. No scleral icterus.  Neck: Normal range of motion. Neck supple.  Cardiovascular: Normal rate, regular rhythm and normal heart sounds.  Exam reveals no gallop and no friction rub.   No murmur heard. Pulmonary/Chest: Effort normal  and breath sounds normal. No respiratory distress. She has no wheezes. She has no rales. She exhibits no tenderness.  Lymphadenopathy:    She has no cervical adenopathy.  Neurological: She is alert and oriented to person, place, and time.  Skin: Skin is warm and dry. No rash noted. She is not diaphoretic.   Psychiatric: She has a normal mood and affect. Her behavior is normal. Judgment and thought content normal.   Assessment & Plan:   Danielle Pratt was seen today for sinusitis and ear fullness.  Diagnoses and all orders for this visit:  Acute pansinusitis, recurrence not specified  Other orders -     amoxicillin-clavulanate (AUGMENTIN) 875-125 MG tablet; Take 1 tablet by mouth 2 (two) times daily.  I am having Ms. Wideman start on amoxicillin-clavulanate. I am also having her maintain her estradiol, pilocarpine, Cholecalciferol (VITAMIN D-3 PO), tretinoin, levothyroxine, and venlafaxine XR.  Meds ordered this encounter  Medications  . amoxicillin-clavulanate (AUGMENTIN) 875-125 MG tablet    Sig: Take 1 tablet by mouth 2 (two) times daily.    Dispense:  14 tablet    Refill:  0    Order Specific Question:  Supervising Provider    Answer:  Crecencio Mc [[id number]]     Follow-up: Return if symptoms worsen or fail to improve.

## 2015-12-20 NOTE — Assessment & Plan Note (Signed)
New Onset Due to length of symptoms with worsening will treat empirically  Augmentin was sent to the pharmacy [facility] Pro[employer]otics Continue OTC measures  FU prn worsening/failure to improve.   She is going to see her Dentist for an x-ray today to look for tooth considerations-

## 2015-12-20 NOTE — Patient Instructions (Addendum)
Please follow up with Dentist for x-ray.   Augmentin as prescribed. Continue ibuprofen and claritin-D.   Call us if no improvement in 7 days.

## 2016-01-10 DIAGNOSIS — I82409 Acute embolism and thrombosis of unspecified deep veins of unspecified lower extremity: Secondary | ICD-10-CM

## 2016-01-10 HISTORY — DX: Acute embolism and thrombosis of unspecified deep veins of unspecified lower extremity: I82.409

## 2016-01-17 ENCOUNTER — Emergency Department (HOSPITAL_COMMUNITY): Payer: BLUE CROSS/BLUE SHIELD

## 2016-01-17 ENCOUNTER — Encounter (HOSPITAL_COMMUNITY): Payer: Self-pay

## 2016-01-17 ENCOUNTER — Emergency Department (HOSPITAL_COMMUNITY)
Admission: EM | Admit: 2016-01-17 | Discharge: 2016-01-17 | Disposition: A | Discharge status: Home / Self Care | Payer: BLUE CROSS/BLUE SHIELD | Attending: Emergency Medicine | Admitting: Emergency Medicine

## 2016-01-17 DIAGNOSIS — E039 Hypothyroidism, unspecified: Secondary | ICD-10-CM | POA: Insufficient documentation

## 2016-01-17 DIAGNOSIS — Y998 Other external cause status: Secondary | ICD-10-CM | POA: Insufficient documentation

## 2016-01-17 DIAGNOSIS — Y792 Prosthetic and other implants, materials and accessory orthopedic devices associated with adverse incidents: Secondary | ICD-10-CM | POA: Insufficient documentation

## 2016-01-17 DIAGNOSIS — Z79899 Other long term (current) drug therapy: Secondary | ICD-10-CM | POA: Diagnosis not present

## 2016-01-17 DIAGNOSIS — Z8669 Personal history of other diseases of the nervous system and sense organs: Secondary | ICD-10-CM | POA: Diagnosis not present

## 2016-01-17 DIAGNOSIS — Z793 Long term (current) use of hormonal contraceptives: Secondary | ICD-10-CM | POA: Diagnosis not present

## 2016-01-17 DIAGNOSIS — X58XXXA Exposure to other specified factors, initial encounter: Secondary | ICD-10-CM | POA: Insufficient documentation

## 2016-01-17 DIAGNOSIS — S79911A Unspecified injury of right hip, initial encounter: Secondary | ICD-10-CM | POA: Diagnosis present

## 2016-01-17 DIAGNOSIS — Z8739 Personal history of other diseases of the musculoskeletal system and connective tissue: Secondary | ICD-10-CM | POA: Insufficient documentation

## 2016-01-17 DIAGNOSIS — F419 Anxiety disorder, unspecified: Secondary | ICD-10-CM | POA: Diagnosis not present

## 2016-01-17 DIAGNOSIS — Y9289 Other specified places as the place of occurrence of the external cause: Secondary | ICD-10-CM | POA: Insufficient documentation

## 2016-01-17 DIAGNOSIS — Y9389 Activity, other specified: Secondary | ICD-10-CM | POA: Diagnosis not present

## 2016-01-17 DIAGNOSIS — Z85818 Personal history of malignant neoplasm of other sites of lip, oral cavity, and pharynx: Secondary | ICD-10-CM | POA: Diagnosis not present

## 2016-01-17 DIAGNOSIS — S73004A Unspecified dislocation of right hip, initial encounter: Secondary | ICD-10-CM

## 2016-01-17 DIAGNOSIS — T84020A Dislocation of internal right hip prosthesis, initial encounter: Secondary | ICD-10-CM | POA: Diagnosis not present

## 2016-01-17 MED ORDER — ONDANSETRON HCL 4 MG/2ML IJ SOLN
4.0000 mg | Freq: Once | INTRAMUSCULAR | Status: AC
  Administered 2016-01-17: 4 mg via INTRAVENOUS
  Filled 2016-01-17: qty 2

## 2016-01-17 MED ORDER — FENTANYL CITRATE (PF) 100 MCG/2ML IJ SOLN
50.0000 ug | Freq: Once | INTRAMUSCULAR | Status: AC
  Administered 2016-01-17: 50 ug via INTRAVENOUS
  Filled 2016-01-17: qty 2

## 2016-01-17 MED ORDER — PROPOFOL 10 MG/ML IV BOLUS
0.5000 mg/kg | Freq: Once | INTRAVENOUS | Status: DC
  Filled 2016-01-17: qty 20

## 2016-01-17 MED ORDER — PROPOFOL 10 MG/ML IV BOLUS
INTRAVENOUS | Status: AC | PRN
  Administered 2016-01-17 (×2): 25 mg via INTRAVENOUS
  Administered 2016-01-17: 12.5 mg via INTRAVENOUS
  Administered 2016-01-17: 25 mg via INTRAVENOUS

## 2016-01-17 MED ORDER — KETAMINE HCL-SODIUM CHLORIDE 100-0.9 MG/10ML-% IV SOSY
0.5000 mg/kg | PREFILLED_SYRINGE | Freq: Once | INTRAVENOUS | Status: DC
  Filled 2016-01-17: qty 10

## 2016-01-17 MED ORDER — SODIUM CHLORIDE 0.9 % IV SOLN
INTRAVENOUS | Status: AC | PRN
  Administered 2016-01-17: 50 mL/h via INTRAVENOUS

## 2016-01-17 MED ORDER — SODIUM CHLORIDE 0.9 % IV SOLN
INTRAVENOUS | Status: DC
  Administered 2016-01-17: 17:00:00 via INTRAVENOUS

## 2016-01-17 MED ORDER — KETAMINE HCL 10 MG/ML IJ SOLN
INTRAMUSCULAR | Status: AC | PRN
  Administered 2016-01-17: 12.5 mg via INTRAVENOUS
  Administered 2016-01-17 (×3): 25 mg via INTRAVENOUS

## 2016-01-17 NOTE — ED Notes (Signed)
Bed: [id number] Expected date:  Expected time:  Means of arrival:  Comments: 48F/R hip dislocation

## 2016-01-17 NOTE — ED Notes (Signed)
Per [facility]- Pt states she was stretching and rt hip dislocated. HX of the same. Rt hip surgery 6 years ago. Pt sitting in chair upon EMS arrival. Good CMS to BLE. No other complaints

## 2016-01-17 NOTE — ED Provider Notes (Signed)
CSN: [id number]     Arrival date & time 01/17/16  1512 History   First MD Initiated Contact with Patient 01/17/16 1537     Chief Complaint  Patient presents with  . Hip Pain  . Dislocation    right hip without injury     (Consider location/radiation/quality/duration/timing/severity/associated sxs/prior Treatment) HPI  [name] is a 65 y.o. femalePresents for evaluation of right hip pain which occurred when she was stretching, in bed this morning. She's not had anything to eat or drink today. Last similar problem about 3 years ago. No other injuries. No recent illnesses. She is taking her medications as prescribed.    Past Medical History  Diagnosis Date  . Hypothyroidism   . Hyperlipidemia   . Fasting hyperglycemia     110 in 01/2011;131 in 05/2007  . Popliteal cyst   . Anxiety   . Cancer ([place])     [place] ca 2000  . Cataract     extraction surgery   Past Surgical History  Procedure Laterality Date  . Cesarean section      G 1 P 2  . Total abdominal hysterectomy w/ bilateral salpingoophorectomy      fibroids; Dr Nori Riis  . Total hip arthroplasty  2007    Dr Maureen Ralphs, both hips 3 months apart  . Tonsillectomy and adenoidectomy  1998    R tonsilar malignancy; subsequent LN dissection negative  . Colonoscopy  2003    negative; Dr Henrene Pastor  . Lymphadenectomy Right     neck   Family History  Problem Relation Age of Onset  . Hyperlipidemia Mother   . Hypertension Mother   . Ovarian cancer Mother   . Aneurysm Mother     ascending & descending aorta  . Heart attack Father 87  . Leukemia Paternal Grandmother   . Lung cancer Paternal Grandfather   . Diabetes Neg Hx   . Stroke Neg Hx   . Colon cancer Neg Hx   . Stomach cancer Neg Hx   . Rectal cancer Neg Hx    Social History  Substance Use Topics  . Smoking status: Never Smoker   . Smokeless tobacco: Never Used  . Alcohol Use: 0.0 oz/week    0 Standard drinks or equivalent per week     Comment:  3/week   OB  History    No data available     Review of Systems  All other systems reviewed and are negative.     Allergies  Review of patient's allergies indicates no known allergies.  Home Medications   Prior to Admission medications   Medication Sig Start Date End Date Taking? Authorizing Provider  Cholecalciferol (VITAMIN D-3 PO) Take 2,000 Units by mouth daily.   Yes Historical Provider, MD  estradiol (ESTRACE) 2 MG tablet Take 2 mg by mouth at bedtime.    Yes Historical Provider, MD  levothyroxine (SYNTHROID, LEVOTHROID) 50 MCG tablet TAKE ONE AND ONE-HALF TABLETS BY MOUTH DAILY EXCEPT ON TUESDAYS AND SATURDAYS TAKE ONE TABLET Patient taking differently: Take 50-75 mcg by mouth daily before breakfast. TAKE ONE AND ONE-HALF= 62mcg  TABLETS BY MOUTH DAILY; EXCEPT ON TUESDAYS AND SATURDAYS TAKE ONE TABLET= 37mcg 07/28/15  Yes Hendricks Limes, MD  pilocarpine (SALAGEN) 5 MG tablet Take 5 mg by mouth 4 (four) times daily.   Yes Historical Provider, MD  tretinoin (RETIN-A) 0.05 % cream Apply 1 application topically 2 (two) times daily as needed (acne). Rx'ed by Dermatologist   Yes Historical Provider, MD  venlafaxine XR (EFFEXOR-XR) 75 MG 24 hr capsule Take 1 capsule (75 mg total) by mouth at bedtime. 07/28/15  Yes Hendricks Limes, MD  amoxicillin-clavulanate (AUGMENTIN) 875-125 MG tablet Take 1 tablet by mouth 2 (two) times daily. Patient not taking: Reported on 01/17/2016 12/20/15   Rubbie Battiest, NP   BP 130/77 mmHg  Pulse 80  Resp 16  Wt 153 lb 10.6 oz (69.7 kg)  SpO2 100% Physical Exam  Constitutional: She is oriented to person, place, and time. She appears well-developed and well-nourished.  HENT:  Head: Normocephalic and atraumatic.  Right Ear: External ear normal.  Left Ear: External ear normal.  Eyes: Conjunctivae and EOM are normal. Pupils are equal, round, and reactive to light.  Neck: Normal range of motion and phonation normal. Neck supple.  Cardiovascular: Normal rate.    Pulmonary/Chest: Effort normal. She exhibits no bony tenderness.  Musculoskeletal: She exhibits no edema.  Right leg shortened with right hip pain, which prevents mobilization of the right hip.  Neurological: She is alert and oriented to person, place, and time. No cranial nerve deficit or sensory deficit. She exhibits normal muscle tone. Coordination normal.  Skin: Skin is warm, dry and intact.  Psychiatric: She has a normal mood and affect. Her behavior is normal. Judgment and thought content normal.  Nursing note and vitals reviewed.   ED Course  Procedures (including critical care time)  Medications  0.9 %  sodium chloride infusion ( Intravenous New Bag/Given 01/17/16 1651)  propofol (DIPRIVAN) 10 mg/mL bolus/IV push 34.9 mg (not administered)  ketamine 100 mg in normal saline 10 mL (10mg /mL) syringe (not administered)  fentaNYL (SUBLIMAZE) injection 50 mcg (50 mcg Intravenous Given 01/17/16 1651)  ondansetron (ZOFRAN) injection 4 mg (4 mg Intravenous Given 01/17/16 1651)  0.9 %  sodium chloride infusion (50 mL/hr Intravenous New Bag/Given 01/17/16 1710)  propofol (DIPRIVAN) 10 mg/mL bolus/IV push (25 mg Intravenous New Bag/Given 01/17/16 1719)  ketamine (KETALAR) injection (25 mg Intravenous Given 01/17/16 1719)    Patient Vitals for the past 24 hrs:  BP Pulse Resp SpO2 Weight  01/17/16 1834 130/77 mmHg 80 16 100 % -  01/17/16 1830 130/77 mmHg 74 17 100 % -  01/17/16 1815 127/73 mmHg 80 19 100 % -  01/17/16 1800 122/75 mmHg 77 16 99 % -  01/17/16 1730 129/75 mmHg 86 13 99 % -  01/17/16 1719 129/84 mmHg 72 (!) 29 97 % -  01/17/16 1715 118/75 mmHg 65 25 92 % -  01/17/16 1714 118/75 mmHg 78 (!) 30 93 % -  01/17/16 1710 129/77 mmHg 91 14 97 % -  01/17/16 1700 129/77 mmHg 77 18 99 % -  01/17/16 1600 - - - - 153 lb 10.6 oz (69.7 kg)  01/17/16 1528 - - - 94 % -   Procedural sedation Performed by: Richarda Blade Consent: Verbal consent obtained. Risks and benefits: risks, benefits and  alternatives were discussed Required items: required blood products, implants, devices, and special equipment available Patient identity confirmed: arm band and provided demographic data Time out: Immediately prior to procedure a "time out" was called to verify the correct patient, procedure, equipment, support staff and site/side marked as required.  Sedation type: moderate (conscious) sedation NPO time confirmed and considedered  Sedatives: PROPOFOL and KETAMINE (100mg  each)  Physician Time at Bedside: 35  Vitals: Vital signs were monitored during sedation. Cardiac Monitor, pulse oximeter Patient tolerance: Patient tolerated the procedure well with no immediate complications. Comments: Pt with uneventful  recovered. Returned to pre-procedural sedation baseline  Reduction of dislocation Date/Time: 6:41 PM Performed by: Richarda Blade Authorized by: Richarda Blade Consent: Verbal consent obtained. Risks and benefits: risks, benefits and alternatives were discussed Consent given by: patient Required items: required blood products, implants, devices, and special equipment available Time out: Immediately prior to procedure a "time out" was called to verify the correct patient, procedure, equipment, support staff and site/side marked as required.  Patient sedated: with Ketofol  Vitals: Vital signs were monitored during sedation. Patient tolerance: Patient tolerated the procedure well with no immediate complications. Joint: right hip Reduction technique: Flexion. external rotation, internal rotation, extension Right knee immobilizer applied immediately after procedure to prevent redislocation  6:39 PM Reevaluation with update and discussion. After initial assessment and treatment, an updated evaluation reveals has recovered to baseline,hip, has been successfully reduced. No complications encountered. Findings discussed with patient and husband, all questions answered. [place] Review Labs Reviewed - No data to display  Imaging Review Dg Hip Port Unilat With Pelvis 1v Right  01/17/2016  CLINICAL DATA:  Post reduction right hip EXAM: DG HIP (WITH OR WITHOUT PELVIS) 1V PORT RIGHT COMPARISON:  01/17/2016 FINDINGS: Satisfactory reduction of right hip dislocation. Hip prosthesis in satisfactory position alignment. No fracture IMPRESSION: Satisfactory reduction of right hip dislocation. Electronically Signed   By: Franchot Gallo M.D.   On: 01/17/2016 18:00   Dg Hip Unilat With Pelvis 2-3 Views Right  01/17/2016  CLINICAL DATA:  Stretching in bed and felt right hip popped out EXAM: DG HIP (WITH OR WITHOUT PELVIS) 2-3V RIGHT COMPARISON:  None. FINDINGS: Previous bilateral total hip arthroplasty. There has been superior dislocation of the right hip arthroplasty device. The left hip appears located. IMPRESSION: 1. Interval superior dislocation of the right hip arthroplasty device. Electronically Signed   By: Kerby Moors M.D.   On: 01/17/2016 16:31   I have personally reviewed and evaluated these images and lab results as part of my medical decision-making.   EKG Interpretation None      MDM   Final diagnoses:  None    Spontaneous dislocation on  Right hip, reduced. No fracture, no complication.  Nursing Notes Reviewed/ Care Coordinated Applicable Imaging Reviewed Interpretation of Laboratory Data incorporated into ED treatment  The patient appears reasonably screened and/or stabilized for discharge and I doubt any other medical condition or other [facility] requiring further screening, evaluation, or treatment in the ED at this time prior to discharge.  Plan: Home Medications- APAP; Home Treatments- rest, Knee immobilizer; return here if the recommended treatment, does not improve the symptoms; Recommended follow up- Ortho asap     Daleen Bo, MD 01/17/16 1844

## 2016-01-17 NOTE — Discharge Instructions (Signed)
Wear the knee immobilizer until you see your orthopedist   Hip Dislocation Hip dislocation is the displacement of the "ball" at the head of your thigh bone (femur) from its socket in the hip bone (pelvis). The ball-and-socket structure of the hip joint gives it a lot of stability, while allowing it to move freely. Therefore, a lot of force is required to displace the femur from its socket. A hip dislocation is an emergency. If you believe you have dislocated your hip and cannot move your leg, call for help immediately. Do not try to move. CAUSES The most common cause of hip dislocation is motor vehicle accidents. However, force from falls from a height (a ladder or building), injuries from contact sports, or injuries from industrial accidents can be enough to dislocate your hip. SYMPTOMS A hip dislocation is very painful. If you have a dislocated hip, you will not be able to move your hip. If you have nerve damage, you may not have feeling in your lower leg, foot, or ankle.  DIAGNOSIS Usually, your caregiver can diagnose a hip dislocation by looking at the position of your leg. Generally, X-ray exams are done to check for fractures in your femur or pelvis. The leg of the dislocated hip will appear shorter than the other leg, and your foot will be turned inward. TREATMENT  Your caregiver can manipulate your bones back into the joint (reduction). If there are no other complications involved with your dislocation, such as fractures or damage to blood vessels or nerves, this procedure can be done without surgery. Before this procedure, you will be given medicine so that you will not feel pain (anesthetic). Often specialized imaging exams are done after the reduction (magnetic resonance imaging [MRI] or computed tomography [CT]) to check for loose pieces of cartilage or bone in the joint. If a manual reduction fails or you have nerve damage, damage to your blood vessels, or bone fractures, surgery will be  necessary to perform the reduction.  HOME CARE INSTRUCTIONS The following measures can help to reduce pain and speed up the healing process:  Rest your injured joint. Do not move your joint if it is painful. Also, avoid activities similar to the one that caused your injury.  Apply ice to your injured joint for 1 to 2 days after your reduction or as directed by your caregiver. Applying ice helps to reduce inflammation and pain.  Put ice in a plastic bag.  Place a towel between your skin and the bag.  Leave the ice on for 15 to 20 minutes at a time, every 2 hours while you are awake.  Use crutches or a walker as directed by your caregiver.  Exercise your hip and leg as directed by your caregiver.  Take over-the-counter or prescription medicine for pain as directed by your caregiver. SEEK IMMEDIATE MEDICAL CARE IF:  Your pain becomes worse rather than better.  You feel like your hip has become dislocated again. MAKE SURE YOU:  Understand these instructions.  Will watch your condition.  Will get help right away if you are not doing well or get worse.   This information is not intended to replace advice given to you by your health care provider. Make sure you discuss any questions you have with your health care provider.   Document Released: 07/23/2001 Document Revised: 11/18/2014 Document Reviewed: 05/30/2015 Elsevier Interactive Patient Education [employer].

## 2016-01-31 ENCOUNTER — Emergency Department (HOSPITAL_COMMUNITY)
Admit: 2016-01-31 | Discharge: 2016-01-31 | Disposition: A | Discharge status: Home / Self Care | Payer: BLUE CROSS/BLUE SHIELD | Attending: Emergency Medicine | Admitting: Emergency Medicine

## 2016-01-31 ENCOUNTER — Emergency Department (HOSPITAL_COMMUNITY): Payer: BLUE CROSS/BLUE SHIELD

## 2016-01-31 ENCOUNTER — Encounter (HOSPITAL_COMMUNITY): Payer: Self-pay | Admitting: *Deleted

## 2016-01-31 ENCOUNTER — Inpatient Hospital Stay (HOSPITAL_COMMUNITY)
Admission: EM | Admit: 2016-01-31 | Discharge: 2016-02-02 | DRG: 175 | Disposition: A | Discharge status: Home / Self Care | Payer: BLUE CROSS/BLUE SHIELD | Attending: Internal Medicine | Admitting: Internal Medicine

## 2016-01-31 DIAGNOSIS — I82401 Acute embolism and thrombosis of unspecified deep veins of right lower extremity: Secondary | ICD-10-CM

## 2016-01-31 DIAGNOSIS — M79609 Pain in unspecified limb: Secondary | ICD-10-CM

## 2016-01-31 DIAGNOSIS — E039 Hypothyroidism, unspecified: Secondary | ICD-10-CM | POA: Diagnosis present

## 2016-01-31 DIAGNOSIS — Z96641 Presence of right artificial hip joint: Secondary | ICD-10-CM | POA: Diagnosis present

## 2016-01-31 DIAGNOSIS — I2699 Other pulmonary embolism without acute cor pulmonale: Principal | ICD-10-CM | POA: Diagnosis present

## 2016-01-31 DIAGNOSIS — Z923 Personal history of irradiation: Secondary | ICD-10-CM | POA: Diagnosis not present

## 2016-01-31 DIAGNOSIS — Z90710 Acquired absence of both cervix and uterus: Secondary | ICD-10-CM | POA: Diagnosis not present

## 2016-01-31 DIAGNOSIS — D169 Benign neoplasm of bone and articular cartilage, unspecified: Secondary | ICD-10-CM | POA: Diagnosis present

## 2016-01-31 DIAGNOSIS — R0602 Shortness of breath: Secondary | ICD-10-CM | POA: Diagnosis present

## 2016-01-31 DIAGNOSIS — J9601 Acute respiratory failure with hypoxia: Secondary | ICD-10-CM | POA: Diagnosis present

## 2016-01-31 DIAGNOSIS — I2690 Septic pulmonary embolism without acute cor pulmonale: Secondary | ICD-10-CM | POA: Diagnosis not present

## 2016-01-31 DIAGNOSIS — I82441 Acute embolism and thrombosis of right tibial vein: Secondary | ICD-10-CM | POA: Diagnosis present

## 2016-01-31 DIAGNOSIS — Z85818 Personal history of malignant neoplasm of other sites of lip, oral cavity, and pharynx: Secondary | ICD-10-CM | POA: Diagnosis not present

## 2016-01-31 DIAGNOSIS — E785 Hyperlipidemia, unspecified: Secondary | ICD-10-CM | POA: Diagnosis present

## 2016-01-31 DIAGNOSIS — Z79890 Hormone replacement therapy (postmenopausal): Secondary | ICD-10-CM

## 2016-01-31 DIAGNOSIS — I447 Left bundle-branch block, unspecified: Secondary | ICD-10-CM | POA: Diagnosis present

## 2016-01-31 DIAGNOSIS — Z7901 Long term (current) use of anticoagulants: Secondary | ICD-10-CM | POA: Diagnosis not present

## 2016-01-31 LAB — CBC
HCT: 42.1 % (ref 36.0–46.0)
HEMOGLOBIN: 13.6 g/dL (ref 12.0–15.0)
MCH: 29.3 pg (ref 26.0–34.0)
MCHC: 32.3 g/dL (ref 30.0–36.0)
MCV: 90.7 fL (ref 78.0–100.0)
Platelets: 226 10*3/uL (ref 150–400)
RBC: 4.64 MIL/uL (ref 3.87–5.11)
RDW: 13.6 % (ref 11.5–15.5)
WBC: 7.2 10*3/uL (ref 4.0–10.5)

## 2016-01-31 LAB — URINALYSIS, ROUTINE W REFLEX MICROSCOPIC
Bilirubin Urine: NEGATIVE
Glucose, UA: NEGATIVE mg/dL
Hgb urine dipstick: NEGATIVE
Ketones, ur: NEGATIVE mg/dL
LEUKOCYTES UA: NEGATIVE
NITRITE: NEGATIVE
PH: 7 (ref 5.0–8.0)
Protein, ur: NEGATIVE mg/dL
SPECIFIC GRAVITY, URINE: 1.025 (ref 1.005–1.030)

## 2016-01-31 LAB — I-STAT TROPONIN, ED
TROPONIN I, POC: 0 ng/mL (ref 0.00–0.08)
Troponin i, poc: 0 ng/mL (ref 0.00–0.08)

## 2016-01-31 LAB — BASIC METABOLIC PANEL
Anion gap: 10 (ref 5–15)
BUN: 12 mg/dL (ref 6–20)
CALCIUM: 9.2 mg/dL (ref 8.9–10.3)
CO2: 24 mmol/L (ref 22–32)
Chloride: 106 mmol/L (ref 101–111)
Creatinine, Ser: 1.05 mg/dL — ABNORMAL HIGH (ref 0.44–1.00)
GFR calc Af Amer: >60 mL/min (ref >60)
GFR, EST NON AFRICAN AMERICAN: 55 mL/min — AB (ref >60)
GLUCOSE: 110 mg/dL — AB (ref 65–99)
Potassium: 4.5 mmol/L (ref 3.5–5.1)
Sodium: 140 mmol/L (ref 135–145)

## 2016-01-31 LAB — BRAIN NATRIURETIC PEPTIDE: B Natriuretic Peptide: 30.4 pg/mL (ref 0.0–100.0)

## 2016-01-31 LAB — TROPONIN I: Troponin I: <0.03 ng/mL (ref <0.031)

## 2016-01-31 MED ORDER — LEVOTHYROXINE SODIUM 50 MCG PO TABS: 50.0000 ug | ORAL_TABLET | ORAL | Status: DC

## 2016-01-31 MED ORDER — PILOCARPINE HCL 5 MG PO TABS
5.0000 mg | ORAL_TABLET | Freq: Four times a day (QID) | ORAL | Status: DC
Start: 2016-01-31 — End: 2016-02-02
  Administered 2016-01-31 – 2016-02-02 (×7): 5 mg via ORAL
  Filled 2016-01-31 (×10): qty 1

## 2016-01-31 MED ORDER — ONDANSETRON HCL 4 MG PO TABS: 4.0000 mg | ORAL_TABLET | Freq: Four times a day (QID) | ORAL | Status: DC | PRN

## 2016-01-31 MED ORDER — HEPARIN (PORCINE) IN NACL 100-0.45 UNIT/ML-% IJ SOLN
1000.0000 [IU]/h | INTRAMUSCULAR | Status: AC
  Administered 2016-01-31 – 2016-02-01 (×2): 1000 [IU]/h via INTRAVENOUS
  Filled 2016-01-31 (×2): qty 250

## 2016-01-31 MED ORDER — VENLAFAXINE HCL ER 75 MG PO CP24
75.0000 mg | ORAL_CAPSULE | Freq: Every day | ORAL | Status: DC
  Administered 2016-02-01 – 2016-02-02 (×2): 75 mg via ORAL
  Filled 2016-01-31 (×3): qty 1

## 2016-01-31 MED ORDER — SODIUM CHLORIDE 0.9% FLUSH
3.0000 mL | Freq: Two times a day (BID) | INTRAVENOUS | Status: DC
  Administered 2016-02-01: 3 mL via INTRAVENOUS

## 2016-01-31 MED ORDER — SODIUM CHLORIDE 0.9 % IV SOLN
INTRAVENOUS | Status: AC
  Administered 2016-01-31 – 2016-02-01 (×2): via INTRAVENOUS

## 2016-01-31 MED ORDER — SODIUM CHLORIDE 0.9 % IV BOLUS (SEPSIS)
1000.0000 mL | Freq: Once | INTRAVENOUS | Status: AC
  Administered 2016-01-31: 1000 mL via INTRAVENOUS

## 2016-01-31 MED ORDER — ASPIRIN 81 MG PO CHEW
162.0000 mg | CHEWABLE_TABLET | Freq: Once | ORAL | Status: AC
  Administered 2016-01-31: 162 mg via ORAL
  Filled 2016-01-31: qty 2

## 2016-01-31 MED ORDER — LEVOTHYROXINE SODIUM 50 MCG PO TABS
75.0000 ug | ORAL_TABLET | ORAL | Status: DC
  Administered 2016-02-01 – 2016-02-02 (×2): 75 ug via ORAL
  Filled 2016-01-31 (×2): qty 2

## 2016-01-31 MED ORDER — IOHEXOL 350 MG/ML SOLN
100.0000 mL | Freq: Once | INTRAVENOUS | Status: AC | PRN
  Administered 2016-01-31: 100 mL via INTRAVENOUS

## 2016-01-31 MED ORDER — HYDROCODONE-ACETAMINOPHEN 5-325 MG PO TABS: 1.0000 | ORAL_TABLET | ORAL | Status: DC | PRN

## 2016-01-31 MED ORDER — ACETAMINOPHEN 325 MG PO TABS: 650.0000 mg | ORAL_TABLET | Freq: Four times a day (QID) | ORAL | Status: DC | PRN

## 2016-01-31 MED ORDER — HEPARIN BOLUS VIA INFUSION
3500.0000 [IU] | Freq: Once | INTRAVENOUS | Status: AC
  Administered 2016-01-31: 3500 [IU] via INTRAVENOUS
  Filled 2016-01-31: qty 3500

## 2016-01-31 MED ORDER — ONDANSETRON HCL 4 MG/2ML IJ SOLN: 4.0000 mg | Freq: Four times a day (QID) | INTRAMUSCULAR | Status: DC | PRN

## 2016-01-31 MED ORDER — ACETAMINOPHEN 650 MG RE SUPP: 650.0000 mg | Freq: Four times a day (QID) | RECTAL | Status: DC | PRN

## 2016-01-31 NOTE — Progress Notes (Signed)
ANTICOAGULATION CONSULT NOTE - Initial Consult  Pharmacy Consult for Heparin  Indication: pulmonary embolus  No Known Allergies  Patient Measurements: Height: 5\' 9"  (175.3 cm) Weight: 152 lb 6.4 oz (69.128 kg) IBW/kg (Calculated) : 66.2   Vital Signs: Temp: 97.8 F (36.6 C) (03/22 0955) Temp Source: Oral (03/22 0955) BP: 132/87 mmHg (03/22 1630) Pulse Rate: 82 (03/22 1630)  Labs:  Recent Labs  01/31/16 0956  HGB 13.6  HCT 42.1  PLT 226  CREATININE 1.05*    Estimated Creatinine Clearance: 57.3 mL/min (by C-G formula based on Cr of 1.05).   Medical History: Past Medical History  Diagnosis Date  . Hypothyroidism   . Hyperlipidemia   . Fasting hyperglycemia     110 in 01/2011;131 in 05/2007  . Popliteal cyst   . Anxiety   . Cancer ([place])     [place] ca 2000  . Cataract     extraction surgery     Assessment: 40 yof admitted 01/31/2016  W/ chest pain and SOB. Pharmacy consulted to start IV heparin for presumed PE.  Also w/ DVT noted in the right gastroc veins.   No AC PTA. Hgb 13.6, 226, no bleeding noted   Goal of Therapy:  Heparin level 0.3-0.7 units/ml Monitor platelets by anticoagulation protocol: Yes   Plan:  Heparin 3500 unit bolus  Heparin 1000 units/hr 6hr HL Daily HL/CBC Monitor for s/s of bleeding   Ariel Dimitri C. Lennox Grumbles, PharmD Pharmacy Resident  Pager: [phone] 01/31/2016 6:05 PM

## 2016-01-31 NOTE — Progress Notes (Signed)
*  PRELIMINARY RESULTS* Vascular Ultrasound Right lower extremity venous duplex has been completed.  Preliminary findings: DVT noted in the right gastroc veins and in the mid right posterior tibial veins.   Landry Mellow, RDMS, RVT  01/31/2016, 3:36 PM

## 2016-01-31 NOTE — ED Provider Notes (Signed)
The patient's CT scan shows pulmonary emboli. Doppler study also shows a DVT in her right lower extremity. Symptoms are most likely related to her recent hip injury and immobility.  Discussed case with Dr Ashok Cordia.  Recommends echo to evaluate rv function.  No indication for lytics.  Patient's vital signs remained stable. I have ordered IV heparin. I will consult the medical service for admission.    Dorie Rank, MD 01/31/16 [id number]

## 2016-01-31 NOTE — ED Provider Notes (Addendum)
CSN: [id number]     Arrival date & time 01/31/16  [id number] History   First MD Initiated Contact with Patient 01/31/16 1356     Chief Complaint  Patient presents with  . Chest Pain  . Shortness of Breath     (Consider location/radiation/quality/duration/timing/severity/associated sxs/prior Treatment) HPI Comments: 65 year old female with left bundle-branch block, hypothyroidism complaint with medications, nonsmoker presents with intermittent waves of mild chest discomfort. Started yesterday lasting seconds at a time proximal me 10 seconds. Patient has had mild soreness of breath with exertion the past few days. Patient had similar symptoms over 10 years ago. No recent cardiac workup. Patient did have hip dislocation recently and was immobile for a number of days. Patient does have right calf tenderness. No exertional chest pain however patient hasn't exercised recently.  Patient is a 65 y.o. female presenting with chest pain and shortness of breath. The history is provided by the patient.  Chest Pain Associated symptoms: shortness of breath   Associated symptoms: no abdominal pain, no back pain, no fever, no headache and not vomiting   Shortness of Breath Associated symptoms: chest pain   Associated symptoms: no abdominal pain, no fever, no headaches, no neck pain, no rash and no vomiting     Past Medical History  Diagnosis Date  . Hypothyroidism   . Hyperlipidemia   . Fasting hyperglycemia     110 in 01/2011;131 in 05/2007  . Popliteal cyst   . Anxiety   . Cancer ([place])     [place] ca 2000  . Cataract     extraction surgery   Past Surgical History  Procedure Laterality Date  . Cesarean section      G 1 P 2  . Total abdominal hysterectomy w/ bilateral salpingoophorectomy      fibroids; Dr Nori Riis  . Total hip arthroplasty  2007    Dr Maureen Ralphs, both hips 3 months apart  . Tonsillectomy and adenoidectomy  1998    R tonsilar malignancy; subsequent LN dissection negative  . Colonoscopy   2003    negative; Dr Henrene Pastor  . Lymphadenectomy Right     neck   Family History  Problem Relation Age of Onset  . Hyperlipidemia Mother   . Hypertension Mother   . Ovarian cancer Mother   . Aneurysm Mother     ascending & descending aorta  . Heart attack Father 35  . Leukemia Paternal Grandmother   . Lung cancer Paternal Grandfather   . Diabetes Neg Hx   . Stroke Neg Hx   . Colon cancer Neg Hx   . Stomach cancer Neg Hx   . Rectal cancer Neg Hx    Social History  Substance Use Topics  . Smoking status: Never Smoker   . Smokeless tobacco: Never Used  . Alcohol Use: 0.0 oz/week    0 Standard drinks or equivalent per week     Comment:  3/week   OB History    No data available     Review of Systems  Constitutional: Negative for fever and chills.  HENT: Negative for congestion.   Eyes: Negative for visual disturbance.  Respiratory: Positive for shortness of breath.   Cardiovascular: Positive for chest pain. Negative for leg swelling.  Gastrointestinal: Negative for vomiting and abdominal pain.  Genitourinary: Negative for dysuria and flank pain.  Musculoskeletal: Negative for back pain, neck pain and neck stiffness.  Skin: Negative for rash.  Neurological: Negative for light-headedness and headaches.      Allergies  Review  of patient's allergies indicates no known allergies.  Home Medications   Prior to Admission medications   Medication Sig Start Date End Date Taking? Authorizing Provider  Cholecalciferol (VITAMIN D-3 PO) Take 2,000 Units by mouth daily.   Yes Historical Provider, MD  levothyroxine (SYNTHROID, LEVOTHROID) 50 MCG tablet TAKE ONE AND ONE-HALF TABLETS BY MOUTH DAILY EXCEPT ON TUESDAYS AND SATURDAYS TAKE ONE TABLET Patient taking differently: Take 50-75 mcg by mouth daily before breakfast. TAKE ONE AND ONE-HALF= 75mcg  TABLETS BY MOUTH DAILY; EXCEPT ON TUESDAYS AND SATURDAYS TAKE ONE TABLET= 84mcg 07/28/15  Yes Hendricks Limes, MD  pilocarpine (SALAGEN)  5 MG tablet Take 5 mg by mouth 4 (four) times daily.   Yes Historical Provider, MD  venlafaxine XR (EFFEXOR-XR) 75 MG 24 hr capsule Take 1 capsule (75 mg total) by mouth at bedtime. Patient taking differently: Take 75 mg by mouth daily with breakfast.  07/28/15  Yes Hendricks Limes, MD  HYDROcodone-acetaminophen (NORCO/VICODIN) 5-325 MG tablet Take 1-2 tablets by mouth every 4 (four) hours as needed for moderate pain. 02/02/16   Costin Karlyne Greenspan, MD  Rivaroxaban (XARELTO STARTER PACK) 15 & 20 MG TBPK Take as directed on package: Start with one 15mg  tablet by mouth twice a day with food. On Day 22, switch to one 20mg  tablet once a day with food. 02/02/16   Costin Karlyne Greenspan, MD  rivaroxaban (XARELTO) 20 MG TABS tablet Take 1 tablet (20 mg total) by mouth daily with supper. After you finish the starter pack. 02/02/16   Costin Karlyne Greenspan, MD   BP 113/68 mmHg  Pulse 81  Temp(Src) 98.6 F (37 C) (Oral)  Resp 16  Ht 5\' 9"  (1.753 m)  Wt 145 lb 12.8 oz (66.134 kg)  BMI 21.52 kg/m2  SpO2 97% Physical Exam  Constitutional: She is oriented to person, place, and time. She appears well-developed and well-nourished.  HENT:  Head: Normocephalic and atraumatic.  Eyes: Conjunctivae are normal. Right eye exhibits no discharge. Left eye exhibits no discharge.  Neck: Normal range of motion. Neck supple. No tracheal deviation present.  Cardiovascular: Normal rate, regular rhythm and intact distal pulses.   Pulmonary/Chest: Effort normal and breath sounds normal.  Abdominal: Soft. She exhibits no distension. There is no tenderness. There is no guarding.  Musculoskeletal: She exhibits tenderness (right calf no sign of infection). She exhibits no edema.  Neurological: She is alert and oriented to person, place, and time.  Skin: Skin is warm. No rash noted.  Psychiatric: She has a normal mood and affect.  Nursing note and vitals reviewed.   ED Course  Procedures (including critical care time) CRITICAL  CARE Performed by: Mariea Clonts   Total critical care time: 35 minutes  Critical care time was exclusive of separately billable procedures and treating other patients.  Critical care was necessary to treat or prevent imminent or life-threatening deterioration.  Critical care was time spent personally by me on the following activities: development of treatment plan with patient and/or surrogate as well as nursing, discussions with consultants, evaluation of patient's response to treatment, examination of patient, obtaining history from patient or surrogate, ordering and performing treatments and interventions, ordering and review of laboratory studies, ordering and review of radiographic studies, pulse oximetry and re-evaluation of patient's condition. Labs Review Labs Reviewed  BASIC METABOLIC PANEL - Abnormal; Notable for the following:    Glucose, Bld 110 (*)    Creatinine, Ser 1.05 (*)    GFR calc non Af Wyvonnia Lora  55 (*)    All other components within normal limits  PHOSPHORUS - Abnormal; Notable for the following:    Phosphorus 4.7 (*)    All other components within normal limits  COMPREHENSIVE METABOLIC PANEL - Abnormal; Notable for the following:    Calcium 8.7 (*)    Total Protein 5.9 (*)    Albumin 3.1 (*)    ALT 11 (*)    All other components within normal limits  CBC  BRAIN NATRIURETIC PEPTIDE  HEPARIN LEVEL (UNFRACTIONATED)  CBC  URINALYSIS, ROUTINE W REFLEX MICROSCOPIC (NOT AT [facility])  MAGNESIUM  TSH  TROPONIN I  TROPONIN I  TROPONIN I  HEMOGLOBIN A1C  HEPARIN LEVEL (UNFRACTIONATED)  CBC  HEPARIN LEVEL (UNFRACTIONATED)  I-STAT TROPOININ, ED  I-STAT TROPOININ, ED    Imaging Review No results found. I have personally reviewed and evaluated these images and lab results as part of my medical decision-making.   EKG Interpretation   Date/Time:  Wednesday January 31 2016 09:52:55 EDT Ventricular Rate:  91 PR Interval:  160 QRS Duration: 124 QT Interval:   390 QTC Calculation: 479 R Axis:   -46 Text Interpretation:  Normal sinus rhythm Biatrial enlargement Left axis  deviation Left bundle branch block Abnormal ECG similar to previous  Confirmed by Colleen Kotlarz  MD, Ahmere Hemenway ([id number]) on 01/31/2016 10:03:06 AM      MDM   Final diagnoses:  PE (pulmonary embolism)  DVT (deep venous thrombosis), right   Patient presents with very mild waves of chest discomfort and focal right calf pain. Patient has been immobile recently with recent hip fx. Plan for CT angiogram to look for blood clot and delta troponin. Patient currently has no symptoms and feels comfortable following up for stress test outpatient if all testing is unremarkable. Patient had baby aspirin today.  Concern for PE, signed out to Dr. Tomi Bamberger to follow up results and final dispo.     Elnora Morrison, MD 02/04/16 1018  Elnora Morrison, MD 02/04/16 1020

## 2016-01-31 NOTE — ED Notes (Signed)
Patient to vascular at this time.

## 2016-01-31 NOTE — ED Notes (Signed)
1st attempt to call report 

## 2016-01-31 NOTE — ED Notes (Signed)
Patient transported to CT 

## 2016-01-31 NOTE — H&P (Signed)
PCP: Unice Cobble, MD  Kathrine Cords  Referring provider Dorie Rank   Chief Complaint:  Shortness of breath leg swelling  HPI: Danielle Pratt is a 65 y.o. female   has a past medical history of Hypothyroidism; Hyperlipidemia; Fasting hyperglycemia; Popliteal cyst; Anxiety; Cancer ([place]); and Cataract.   Presented with mild shortness of breath for past few days. Worsened dyspnea on exertion. She has had limited mobility secondary to recent hip dislocation her right knee immobilizer has been removed last week Thursday but she has been still trying not to move too much. She reports some intermittent chest discomfort not true pain. Her right calf hurts if you touch it. No Fever no chills. She is on estrogen therapy secondary to early hysterectomy and has never came off of it.  No recent bleeding no blood in stool. Last colonoscopy was in December and it was normal up to date on Mammograms.  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   IN ER: He was noted to have right cough tenderness. Afebrile heart rate 82 blood pressure 132/87 satting 90% on room air creatinine 1.05 white blood cell count 7.2 platelets 226 hemoglobin 13.6. EKG showed left bundle-branch block CT Lennette Bihari showing acute PE with CT evidence of right heart strain with mild groundglass opacity bilaterally possibly very mild edema. Lower extremity Doppler showed DVT in the right gastroc pains and mid right posterior tibial veins Case has been discussed with CCM Dr. Ashok Cordia who at this point feels is no indication for acute thrombolytics. Admit to medicine Patient was started on heparin  Regarding pertinent past history: Patient has osteoma of heart palate. Patient status post right hip replacement on eighth of March she was stretching and had right hip dislocated. She has not had prior problems with this in the past. She presented to emergency department and had  reduction with right knee immobilizer applied Hospitalist was called for admission for pulmonary embolism in the setting of recent immobilization secondary to hip dislocation  Review of Systems:    Pertinent positives include: shortness of breath at rest. dyspnea on exertion, chest pain  Constitutional:  No weight loss, night sweats, Fevers, chills, fatigue, weight loss  HEENT:  No headaches, Difficulty swallowing,Tooth/dental problems,Sore throat,  No sneezing, itching, ear ache, nasal congestion, post nasal drip,  Cardio-vascular:   , Orthopnea, PND, anasarca, dizziness, palpitations.no Bilateral lower extremity swelling  GI:  No heartburn, indigestion, abdominal pain, nausea, vomiting, diarrhea, change in bowel habits, loss of appetite, melena, blood in stool, hematemesis Resp:    No excess mucus, no productive cough, No non-productive cough, No coughing up of blood.No change in color of mucus.No wheezing. Skin:  no rash or lesions. No jaundice GU:  no dysuria, change in color of urine, no urgency or frequency. No straining to urinate.  No flank pain.  Musculoskeletal:  No joint pain or no joint swelling. No decreased range of motion. No back pain.  Psych:  No change in mood or affect. No depression or anxiety. No memory loss.  Neuro: no localizing neurological complaints, no tingling, no weakness, no double vision, no gait abnormality, no slurred speech, no confusion  Otherwise ROS are negative except for above, 10 systems were reviewed  Past Medical History: Past Medical History  Diagnosis Date  . Hypothyroidism   . Hyperlipidemia   . Fasting hyperglycemia     110 in 01/2011;131 in 05/2007  . Popliteal cyst   .  Anxiety   . Cancer ([place])     [place] ca 2000  . Cataract     extraction surgery   Past Surgical History  Procedure Laterality Date  . Cesarean section      G 1 P 2  . Total abdominal hysterectomy w/ bilateral salpingoophorectomy      fibroids; Dr Nori Riis  .  Total hip arthroplasty  2007    Dr Maureen Ralphs, both hips 3 months apart  . Tonsillectomy and adenoidectomy  1998    R tonsilar malignancy; subsequent LN dissection negative  . Colonoscopy  2003    negative; Dr Henrene Pastor  . Lymphadenectomy Right     neck     Medications: Prior to Admission medications   Medication Sig Start Date End Date Taking? Authorizing Provider  aspirin EC 81 MG tablet Take 81 mg by mouth every 6 (six) hours as needed for moderate pain.   Yes Historical Provider, MD  Cholecalciferol (VITAMIN D-3 PO) Take 2,000 Units by mouth daily.   Yes Historical Provider, MD  estradiol (ESTRACE) 2 MG tablet Take 2 mg by mouth every morning.    Yes Historical Provider, MD  levothyroxine (SYNTHROID, LEVOTHROID) 50 MCG tablet TAKE ONE AND ONE-HALF TABLETS BY MOUTH DAILY EXCEPT ON TUESDAYS AND SATURDAYS TAKE ONE TABLET Patient taking differently: Take 50-75 mcg by mouth daily before breakfast. TAKE ONE AND ONE-HALF= 53mcg  TABLETS BY MOUTH DAILY; EXCEPT ON TUESDAYS AND SATURDAYS TAKE ONE TABLET= 32mcg 07/28/15  Yes Hendricks Limes, MD  pilocarpine (SALAGEN) 5 MG tablet Take 5 mg by mouth 4 (four) times daily.   Yes Historical Provider, MD  venlafaxine XR (EFFEXOR-XR) 75 MG 24 hr capsule Take 1 capsule (75 mg total) by mouth at bedtime. Patient taking differently: Take 75 mg by mouth daily with breakfast.  07/28/15  Yes Hendricks Limes, MD  amoxicillin-clavulanate (AUGMENTIN) 875-125 MG tablet Take 1 tablet by mouth 2 (two) times daily. Patient not taking: Reported on 01/17/2016 12/20/15   Rubbie Battiest, NP    Allergies:  No Known Allergies  Social History:  Ambulatory limited secondary to knee mobilizer Lives at home With family     reports that she has never smoked. She has never used smokeless tobacco. She reports that she drinks alcohol. She reports that she does not use illicit drugs.     Family History: family history includes Aneurysm in her mother; Heart attack (age of onset: 11)  in her father; Hyperlipidemia in her mother; Hypertension in her mother; Leukemia in her paternal grandmother; Lung cancer in her paternal grandfather; Ovarian cancer in her mother. There is no history of Diabetes, Stroke, Colon cancer, Stomach cancer, or Rectal cancer.    Physical Exam: Patient Vitals for the past 24 hrs:  BP Temp Temp src Pulse Resp SpO2 Height Weight  01/31/16 1630 132/87 mmHg - - 82 18 93 % - -  01/31/16 1500 138/90 mmHg - - 88 17 94 % - -  01/31/16 1430 130/77 mmHg - - 86 18 90 % - -  01/31/16 1415 131/74 mmHg - - 90 19 94 % - -  01/31/16 1233 127/86 mmHg - - 88 18 97 % - -  01/31/16 0958 - - - - - - - 69.128 kg (152 lb 6.4 oz)  01/31/16 0955 156/94 mmHg 97.8 F (36.6 C) Oral 99 18 98 % 5\' 9"  (1.753 m) -    1. General:  in No Acute distress 2. Psychological: Alert and  Oriented 3. Head/ENT:  Moist  Mucous Membranes                          Head Non traumatic, neck supple                          Normal   Dentition 4. SKIN: normal   Skin turgor,  Skin clean Dry and intact no rash 5. Heart: Regular rate and rhythm no  Murmur, Rub or gallop 6. Lungs no wheezes or crackles   7. Abdomen: Soft, non-tender, Non distended 8. Lower extremities: no clubbing, cyanosis, or edema 9. Neurologically Grossly intact, moving all 4 extremities equally 10. MSK: Normal range of motion  body mass index is 22.5 kg/(m^2).   Labs on Admission:   Results for orders placed or performed during the hospital encounter of 01/31/16 (from the past 24 hour(s))  Basic metabolic panel     Status: Abnormal   Collection Time: 01/31/16  9:56 AM  Result Value Ref Range   Sodium 140 135 - 145 mmol/L   Potassium 4.5 3.5 - 5.1 mmol/L   Chloride 106 101 - 111 mmol/L   CO2 24 22 - 32 mmol/L   Glucose, Bld 110 (H) 65 - 99 mg/dL   BUN 12 6 - 20 mg/dL   Creatinine, Ser 1.05 (H) 0.44 - 1.00 mg/dL   Calcium 9.2 8.9 - 10.3 mg/dL   GFR calc non Af Amer 55 (L) >60 mL/min   GFR calc Af Amer >60 >60  mL/min   Anion gap 10 5 - 15  CBC     Status: None   Collection Time: 01/31/16  9:56 AM  Result Value Ref Range   WBC 7.2 4.0 - 10.5 K/uL   RBC 4.64 3.87 - 5.11 MIL/uL   Hemoglobin 13.6 12.0 - 15.0 g/dL   HCT 42.1 36.0 - 46.0 %   MCV 90.7 78.0 - 100.0 fL   MCH 29.3 26.0 - 34.0 pg   MCHC 32.3 30.0 - 36.0 g/dL   RDW 13.6 11.5 - 15.5 %   Platelets 226 150 - 400 K/uL  I-stat troponin, ED (not at [facility])     Status: None   Collection Time: 01/31/16 10:09 AM  Result Value Ref Range   Troponin i, poc 0.00 0.00 - 0.08 ng/mL   Comment 3          I-stat troponin, ED     Status: None   Collection Time: 01/31/16  3:22 PM  Result Value Ref Range   Troponin i, poc 0.00 0.00 - 0.08 ng/mL   Comment 3            UA ordered  No results found for: HGBA1C  Estimated Creatinine Clearance: 57.3 mL/min (by C-G formula based on Cr of 1.05).  BNP (last 3 results) No results for input(s): PROBNP in the last 8760 hours.  Other results:  I have pearsonaly reviewed this: ECG REPORT  Rate: 91  Rhythm: Left bundle-branch block ST&T Change: n/a [id number]  Filed Weights   01/31/16 0958  Weight: 69.128 kg (152 lb 6.4 oz)     Cultures: No results found for: SDES, SPECREQUEST, CULT, REPTSTATUS   Radiological Exams on Admission: Dg Chest 2 View  01/31/2016  CLINICAL DATA:  Chest pain EXAM: CHEST  2 VIEW COMPARISON:  07/21/2006 FINDINGS: The heart size and mediastinal contours are within normal limits. Both lungs are clear. The visualized skeletal structures  are unremarkable. IMPRESSION: No active cardiopulmonary disease. Electronically Signed   By: Inez Catalina M.D.   On: 01/31/2016 10:46   Ct Angio Chest Pe W/cm &/or Wo Cm  01/31/2016  CLINICAL DATA:  Right calf pain, chest pain, and shortness of breath. EXAM: CT ANGIOGRAPHY CHEST WITH CONTRAST TECHNIQUE: Multidetector CT imaging of the chest was performed using the standard protocol during bolus administration of intravenous contrast.  Multiplanar CT image reconstructions and MIPs were obtained to evaluate the vascular anatomy. CONTRAST:  80 mL Omnipaque 350 COMPARISON:  Chest radiographs earlier today FINDINGS: Pulmonary arterial opacification is adequate. Filling defects consistent with emboli involve segmental arterial branches in the anterior segment right upper lobe, lateral segment right middle lobe, and basilar right lower lobe segments. There is also involvement of multiple segmental and subsegmental left lower lobe branches as well as subsegmental left upper lobe branches. No central embolus is present. The right ventricle/left ventricle ratio is 4.6 cm/4.9 cm = 0.94. There is flattening of the interventricular septum. The central pulmonary arteries superior slightly enlarged, with the main pulmonary artery measuring 3.1 cm in diameter. No enlarged axillary, mediastinal, or hilar lymph nodes are identified. There is no pleural or pericardial effusion. Major airways are patent. Minimal scarring is present in the lung apices. There is mild ground-glass opacity/mosaic attenuation bilaterally, although some of this appearance may be due to motion artifact. Subsegmental atelectasis is present in both lung bases. Visualized portion of the upper abdomen is unremarkable. Mild pectus excavatum deformity is noted, and there is mild thoracic dextroscoliosis. Review of the MIP images confirms the above findings. IMPRESSION: 1. Positive for acute PE with CT evidence of right heart strain (RV/LV Ratio = 0.94) consistent with at least submassive (intermediate risk) PE. The presence of right heart strain has been associated with an increased risk of morbidity and mortality. Please activate Code PE by paging [phone]. 2. Mild ground-glass opacity bilaterally, possibly reflecting very mild edema. Critical Value/emergent results were called by telephone at the time of interpretation on 01/31/2016 at 5:47 pm to Dr. Tomi Bamberger, who verbally acknowledged these  results. Electronically Signed   By: Logan Bores M.D.   On: 01/31/2016 17:48    Chart has been reviewed  Family not  at  Bedside    Assessment/Plan  65 year old female with history of heart palate cancer in the past with recent hip dislocation and immobilization presents with right lower extremity DVT and PE worrisome for right heart strain per CT appears to be stable  Present on Admission:  . Pulmonary emboli (HCC) - most likely provocation. Immobilization. continue heparin. Obtain echogram cycle cardiac enzymes for prognostication purposes. Will need discussion with care management regarding choices for long-term anticoagulation monitor on telemetry  We'll discontinue estrogen therapy  Prophylaxis: Heparin  CODE STATUS:  FULL CODE  as per patient    Disposition:  To home once workup is complete and patient is stable  Other plan as per orders.  I have spent a total of 56 min on this admission    Zipporah Finamore 01/31/2016, 7:24 PM    [facility]  Pager [phone]   after 2 AM please page floor coverage PA If 7AM-7PM, please contact the day team taking care of the patient  [email or web address]  Password [email or web address]

## 2016-01-31 NOTE — ED Notes (Addendum)
Pt reports a chest discomfort since yesterday, states "it just doesn't feel right." having mild sob with exertion over past few days, denies swelling to legs. ekg done on arrival, no resp distress noted. Also reports recent right lower leg discomfort.

## 2016-01-31 NOTE — ED Notes (Signed)
2nd attempt to call report.  

## 2016-02-01 ENCOUNTER — Other Ambulatory Visit: Payer: Self-pay

## 2016-02-01 DIAGNOSIS — I2699 Other pulmonary embolism without acute cor pulmonale: Principal | ICD-10-CM

## 2016-02-01 LAB — COMPREHENSIVE METABOLIC PANEL
ALK PHOS: 61 U/L (ref 38–126)
ALT: 11 U/L — AB (ref 14–54)
AST: 15 U/L (ref 15–41)
Albumin: 3.1 g/dL — ABNORMAL LOW (ref 3.5–5.0)
Anion gap: 8 (ref 5–15)
BUN: 7 mg/dL (ref 6–20)
CHLORIDE: 105 mmol/L (ref 101–111)
CO2: 28 mmol/L (ref 22–32)
CREATININE: 0.86 mg/dL (ref 0.44–1.00)
Calcium: 8.7 mg/dL — ABNORMAL LOW (ref 8.9–10.3)
GFR calc Af Amer: >60 mL/min (ref >60)
Glucose, Bld: 94 mg/dL (ref 65–99)
Potassium: 3.7 mmol/L (ref 3.5–5.1)
Sodium: 141 mmol/L (ref 135–145)
Total Bilirubin: 0.5 mg/dL (ref 0.3–1.2)
Total Protein: 5.9 g/dL — ABNORMAL LOW (ref 6.5–8.1)

## 2016-02-01 LAB — CBC
HEMATOCRIT: 37.5 % (ref 36.0–46.0)
HEMOGLOBIN: 12.6 g/dL (ref 12.0–15.0)
MCH: 30.3 pg (ref 26.0–34.0)
MCHC: 33.6 g/dL (ref 30.0–36.0)
MCV: 90.1 fL (ref 78.0–100.0)
PLATELETS: 217 10*3/uL (ref 150–400)
RBC: 4.16 MIL/uL (ref 3.87–5.11)
RDW: 13.6 % (ref 11.5–15.5)
WBC: 5.2 10*3/uL (ref 4.0–10.5)

## 2016-02-01 LAB — TROPONIN I: Troponin I: <0.03 ng/mL (ref <0.031)

## 2016-02-01 LAB — MAGNESIUM: Magnesium: 2.1 mg/dL (ref 1.7–2.4)

## 2016-02-01 LAB — HEPARIN LEVEL (UNFRACTIONATED)
HEPARIN UNFRACTIONATED: 0.49 [IU]/mL (ref 0.30–0.70)
HEPARIN UNFRACTIONATED: 0.47 [IU]/mL (ref 0.30–0.70)

## 2016-02-01 LAB — PHOSPHORUS: Phosphorus: 4.7 mg/dL — ABNORMAL HIGH (ref 2.5–4.6)

## 2016-02-01 LAB — TSH: TSH: 2.907 u[IU]/mL (ref 0.350–4.500)

## 2016-02-01 NOTE — Progress Notes (Addendum)
PROGRESS NOTE  Danielle Pratt [id number] DOB: 13-Oct-1951 DOA: 01/31/2016 PCP: Unice Cobble, MD Outpatient Specialists:    LOS: 1 day   Brief Narrative: 65 yo F with hypothyroidism, HLD, admitted on 3/22 with shortness of breath, DOE, chest discomfort and right calf pain, found to have DVT and PE with apparent right heart strain on CT.  Assessment & Plan: Active Problems:   Pulmonary emboli (HCC)   Pulmonary embolism (HCC)  PE / DVT - with right heart strain, continue heparin gtt for now, closely monitor for bleeding - 2D echo pending - extensive discussion re oral anticoagulation, will go with NOAC, asked CM to evaluate copay and benefits - Lower extremity Doppler showed DVT in the right gastroc pains and mid right posterior tibial veins - DVT / PE provoked in the setting of immobility due to recent hip dislocation - discontinue estrogen therapy  Acute hypoxic respiratory failure - wean off oxygen as tolerated  Hypothyroidism - continue synthroid   DVT prophylaxis: hepatin Code Status: Full Family Communication: no family bedside Disposition Plan: home when ready Barriers for discharge: 2D echo, heparin  Consultants:   None   Procedures:   2D echo: pending  Antimicrobials:  None    Subjective: - feeling better, denies shortness of breath.  Objective: Filed Vitals:   02/01/16 0010 02/01/16 0300 02/01/16 0817 02/01/16 1132  BP: 139/74 123/79 123/83 130/75  Pulse: 80 73 71 84  Temp: 98.3 F (36.8 C) 97.6 F (36.4 C) 97.4 F (36.3 C) 98.4 F (36.9 C)  TempSrc: Oral Oral Oral Oral  Resp: 16 10    Height:      Weight:  67.087 kg (147 lb 14.4 oz)    SpO2: 98% 97% 95% 95%    Intake/Output Summary (Last 24 hours) at 02/01/16 1213 Last data filed at 02/01/16 1033  Gross per 24 hour  Intake      0 ml  Output   3050 ml  Net  -3050 ml   Filed Weights   01/31/16 0958 01/31/16 2113 02/01/16 0300  Weight: 69.128 kg (152 lb 6.4 oz) 68.402 kg (150 lb  12.8 oz) 67.087 kg (147 lb 14.4 oz)    Examination: BP 130/75 mmHg  Pulse 84  Temp(Src) 98.4 F (36.9 C) (Oral)  Resp 10  Ht 5\' 9"  (1.753 m)  Wt 67.087 kg (147 lb 14.4 oz)  BMI 21.83 kg/m2  SpO2 95%  GENERAL: NAD  HEENT: head NCAT, no scleral icterus.  NECK: Supple.  LUNGS: Clear to auscultation. No wheezing or crackles  HEART: Regular rate and rhythm without murmur. 2+ pulses, no JVD, no peripheral edema  ABDOMEN: Soft, nontender, and nondistended. Positive bowel sounds.   EXTREMITIES: Without any cyanosis or clubbing  NEUROLOGIC: non focal    Data Reviewed: I have personally reviewed following labs and imaging studies  CBC:  Recent Labs Lab 01/31/16 0956 02/01/16 0349  WBC 7.2 5.2  HGB 13.6 12.6  HCT 42.1 37.5  MCV 90.7 90.1  PLT 226 [id number]   Basic Metabolic Panel:  Recent Labs Lab 01/31/16 0956 02/01/16 0349  NA 140 141  K 4.5 3.7  CL 106 105  CO2 24 28  GLUCOSE 110* 94  BUN 12 7  CREATININE 1.05* 0.86  CALCIUM 9.2 8.7*  MG  --  2.1  PHOS  --  4.7*   GFR: Estimated Creatinine Clearance: 70 mL/min (by C-G formula based on Cr of 0.86). Liver Function Tests:  Recent Labs Lab 02/01/16 0349  AST 15  ALT 11*  ALKPHOS 61  BILITOT 0.5  PROT 5.9*  ALBUMIN 3.1*   No results for input(s): LIPASE, AMYLASE in the last 168 hours. No results for input(s): AMMONIA in the last 168 hours. Coagulation Profile: No results for input(s): INR, PROTIME in the last 168 hours. Cardiac Enzymes:  Recent Labs Lab 01/31/16 2144 02/01/16 0348 02/01/16 0826  TROPONINI <0.03 <0.03 <0.03   BNP (last 3 results) No results for input(s): PROBNP in the last 8760 hours. HbA1C: No results for input(s): HGBA1C in the last 72 hours. CBG: No results for input(s): GLUCAP in the last 168 hours. Lipid Profile: No results for input(s): CHOL, HDL, LDLCALC, TRIG, CHOLHDL, LDLDIRECT in the last 72 hours. Thyroid Function Tests:  Recent Labs  02/01/16 0348  TSH  2.907   Anemia Panel: No results for input(s): VITAMINB12, FOLATE, FERRITIN, TIBC, IRON, RETICCTPCT in the last 72 hours. Urine analysis:    Component Value Date/Time   COLORURINE YELLOW 01/31/2016 2252   APPEARANCEUR CLEAR 01/31/2016 2252   LABSPEC 1.025 01/31/2016 2252   PHURINE 7.0 01/31/2016 2252   GLUCOSEU NEGATIVE 01/31/2016 2252   HGBUR NEGATIVE 01/31/2016 2252   BILIRUBINUR NEGATIVE 01/31/2016 2252   KETONESUR NEGATIVE 01/31/2016 2252   PROTEINUR NEGATIVE 01/31/2016 2252   UROBILINOGEN 0.2 05/27/2007 1132   NITRITE NEGATIVE 01/31/2016 2252   LEUKOCYTESUR NEGATIVE 01/31/2016 2252   Sepsis Labs: Invalid input(s): PROCALCITONIN, LACTICIDVEN  No results found for this or any previous visit (from the past 240 hour(s)).    Radiology Studies: Dg Chest 2 View  01/31/2016  CLINICAL DATA:  Chest pain EXAM: CHEST  2 VIEW COMPARISON:  07/21/2006 FINDINGS: The heart size and mediastinal contours are within normal limits. Both lungs are clear. The visualized skeletal structures are unremarkable. IMPRESSION: No active cardiopulmonary disease. Electronically Signed   By: Inez Catalina M.D.   On: 01/31/2016 10:46   Ct Angio Chest Pe W/cm &/or Wo Cm  01/31/2016  CLINICAL DATA:  Right calf pain, chest pain, and shortness of breath. EXAM: CT ANGIOGRAPHY CHEST WITH CONTRAST TECHNIQUE: Multidetector CT imaging of the chest was performed using the standard protocol during bolus administration of intravenous contrast. Multiplanar CT image reconstructions and MIPs were obtained to evaluate the vascular anatomy. CONTRAST:  80 mL Omnipaque 350 COMPARISON:  Chest radiographs earlier today FINDINGS: Pulmonary arterial opacification is adequate. Filling defects consistent with emboli involve segmental arterial branches in the anterior segment right upper lobe, lateral segment right middle lobe, and basilar right lower lobe segments. There is also involvement of multiple segmental and subsegmental left lower  lobe branches as well as subsegmental left upper lobe branches. No central embolus is present. The right ventricle/left ventricle ratio is 4.6 cm/4.9 cm = 0.94. There is flattening of the interventricular septum. The central pulmonary arteries superior slightly enlarged, with the main pulmonary artery measuring 3.1 cm in diameter. No enlarged axillary, mediastinal, or hilar lymph nodes are identified. There is no pleural or pericardial effusion. Major airways are patent. Minimal scarring is present in the lung apices. There is mild ground-glass opacity/mosaic attenuation bilaterally, although some of this appearance may be due to motion artifact. Subsegmental atelectasis is present in both lung bases. Visualized portion of the upper abdomen is unremarkable. Mild pectus excavatum deformity is noted, and there is mild thoracic dextroscoliosis. Review of the MIP images confirms the above findings. IMPRESSION: 1. Positive for acute PE with CT evidence of right heart strain (RV/LV Ratio = 0.94) consistent with at least submassive (intermediate risk)  PE. The presence of right heart strain has been associated with an increased risk of morbidity and mortality. Please activate Code PE by paging [phone]. 2. Mild ground-glass opacity bilaterally, possibly reflecting very mild edema. Critical Value/emergent results were called by telephone at the time of interpretation on 01/31/2016 at 5:47 pm to Dr. Tomi Bamberger, who verbally acknowledged these results. Electronically Signed   By: Logan Bores M.D.   On: 01/31/2016 17:48     Scheduled Meds: . [START ON 02/03/2016] levothyroxine  50 mcg Oral Once per day on Tue Sat  . levothyroxine  75 mcg Oral Once per day on Sun Mon Wed Thu Fri  . pilocarpine  5 mg Oral QID  . sodium chloride flush  3 mL Intravenous Q12H  . venlafaxine XR  75 mg Oral Q breakfast   Continuous Infusions: . heparin 1,000 Units/hr (01/31/16 1855)    Marzetta Board, MD, PhD [facility] Pager  [phone]  If 7PM-7AM, please contact night-coverage [email or web address] Password [place] 02/01/2016, 12:13 PM

## 2016-02-01 NOTE — Progress Notes (Signed)
UR Completed [address] Graves-Bigelow, RN,BSN [phone]  

## 2016-02-01 NOTE — Care Management Note (Signed)
Case Management Note  Patient Details  Name: Kiyra Faurot MRN: [id number] Date of Birth: 05/11/1951  Subjective/Objective:  Pt admitted for chest discomfort and right calf pain, found to have DVT and PE with apparent right heart strain on CT.                   Action/Plan: Benefits check completed for pt on Xarelto and Eliquis. Co pay:  Per BCBS  online:  Xarelto: on drug list/ tier 3/ $40 for 30 day retail   Eliquis: on drug list/ tier 4/ $80 for 30 day retail   Both of these medications have co-pay cards available- CM will provide savings card to pt before d/c. No further needs from CM at this time.   Expected Discharge Date:                  Expected Discharge Plan:  Home/Self Care  In-House Referral:  NA  Discharge planning Services  CM Consult, Medication Assistance  Post Acute Care Choice:  NA Choice offered to:  NA  DME Arranged:  N/A DME Agency:  NA  HH Arranged:  NA HH Agency:  NA  Status of Service:  Completed, signed off  Medicare Important Message Given:    Date Medicare IM Given:    Medicare IM give by:    Date Additional Medicare IM Given:    Additional Medicare Important Message give by:     If discussed at [place] of Stay Meetings, dates discussed:    Additional Comments:  Bethena Roys, RN 02/01/2016, 2:22 PM

## 2016-02-01 NOTE — Progress Notes (Signed)
ANTICOAGULATION CONSULT NOTE - Follow-up Consult  Pharmacy Consult for Heparin  Indication: pulmonary embolus  No Known Allergies  Patient Measurements: Height: 5\' 9"  (175.3 cm) Weight: 147 lb 14.4 oz (67.087 kg) IBW/kg (Calculated) : 66.2   Vital Signs: Temp: 97.4 F (36.3 C) (03/23 0817) Temp Source: Oral (03/23 0817) BP: 123/83 mmHg (03/23 0817) Pulse Rate: 71 (03/23 0817)  Labs:  Recent Labs  01/31/16 0956 01/31/16 2144 02/01/16 0030 02/01/16 0348 02/01/16 0349 02/01/16 0826  HGB 13.6  --   --   --  12.6  --   HCT 42.1  --   --   --  37.5  --   PLT 226  --   --   --  217  --   HEPARINUNFRC  --   --  0.47  --   --  0.49  CREATININE 1.05*  --   --   --  0.86  --   TROPONINI  --  <0.03  --  <0.03  --  <0.03    Estimated Creatinine Clearance: 70 mL/min (by C-G formula based on Cr of 0.86).   Assessment: 65 yo F on heparin for DVT and PE. Chest CT shows acute PE with CT evidence of right heart strain (RV/LV Ratio = 0.94) consistent with at least submassive (intermediate risk) PE. Heparin level therapeutic x 2  on 1000 units/hr. No bleeding noted.  Goal of Therapy:  Heparin level 0.3-0.7 units/ml Monitor platelets by anticoagulation protocol: Yes   Plan:  Continue heparin 1000 units/hr F/u AM labs  Thank you Anette Guarneri, PharmD [phone]  02/01/2016 9:48 AM

## 2016-02-01 NOTE — Progress Notes (Signed)
ANTICOAGULATION CONSULT NOTE - Follow-up Consult  Pharmacy Consult for Heparin  Indication: pulmonary embolus  No Known Allergies  Patient Measurements: Height: 5\' 9"  (175.3 cm) Weight: 150 lb 12.8 oz (68.402 kg) IBW/kg (Calculated) : 66.2   Vital Signs: Temp: 98.3 F (36.8 C) (03/23 0010) Temp Source: Oral (03/23 0010) BP: 139/74 mmHg (03/23 0010) Pulse Rate: 80 (03/23 0010)  Labs:  Recent Labs  01/31/16 0956 01/31/16 2144 02/01/16 0030  HGB 13.6  --   --   HCT 42.1  --   --   PLT 226  --   --   HEPARINUNFRC  --   --  0.47  CREATININE 1.05*  --   --   TROPONINI  --  <0.03  --     Estimated Creatinine Clearance: 57.3 mL/min (by C-G formula based on Cr of 1.05).   Assessment: 65 yo F on heparin for DVT and PE. Chest CT shows acute PE with CT evidence of right heart strain (RV/LV Ratio = 0.94) consistent with at least submassive (intermediate risk) PE. Heparin level 0.47 (therapeutic) on 1000 units/hr. No bleeding noted.  Goal of Therapy:  Heparin level 0.3-0.7 units/ml Monitor platelets by anticoagulation protocol: Yes   Plan:  Continue heparin 1000 units/hr F/u 6hr confirmatory HL  Sherlon Handing, PharmD, BCPS Clinical pharmacist, pager [phone] 02/01/2016 1:44 AM

## 2016-02-02 ENCOUNTER — Inpatient Hospital Stay (HOSPITAL_COMMUNITY): Payer: BLUE CROSS/BLUE SHIELD

## 2016-02-02 DIAGNOSIS — I2690 Septic pulmonary embolism without acute cor pulmonale: Secondary | ICD-10-CM

## 2016-02-02 LAB — CBC
HCT: 41.5 % (ref 36.0–46.0)
Hemoglobin: 13.7 g/dL (ref 12.0–15.0)
MCH: 29.7 pg (ref 26.0–34.0)
MCHC: 33 g/dL (ref 30.0–36.0)
MCV: 90 fL (ref 78.0–100.0)
Platelets: 248 10*3/uL (ref 150–400)
RBC: 4.61 MIL/uL (ref 3.87–5.11)
RDW: 13.8 % (ref 11.5–15.5)
WBC: 5.4 10*3/uL (ref 4.0–10.5)

## 2016-02-02 LAB — ECHOCARDIOGRAM COMPLETE
Height: 69 in
Weight: 2332.8 oz

## 2016-02-02 LAB — HEPARIN LEVEL (UNFRACTIONATED): Heparin Unfractionated: 0.44 IU/mL (ref 0.30–0.70)

## 2016-02-02 LAB — HEMOGLOBIN A1C
Hgb A1c MFr Bld: 5.6 % (ref 4.8–5.6)
Mean Plasma Glucose: 114 mg/dL

## 2016-02-02 MED ORDER — RIVAROXABAN 15 MG PO TABS
15.0000 mg | ORAL_TABLET | Freq: Two times a day (BID) | ORAL | Status: DC
  Administered 2016-02-02: 15 mg via ORAL
  Filled 2016-02-02: qty 1

## 2016-02-02 MED ORDER — RIVAROXABAN 20 MG PO TABS: 20.0000 mg | ORAL_TABLET | Freq: Every day | ORAL | Status: DC

## 2016-02-02 MED ORDER — RIVAROXABAN (XARELTO) VTE STARTER PACK (15 & 20 MG): ORAL_TABLET | ORAL | Status: DC

## 2016-02-02 MED ORDER — HYDROCODONE-ACETAMINOPHEN 5-325 MG PO TABS: 1.0000 | ORAL_TABLET | ORAL | Status: DC | PRN

## 2016-02-02 NOTE — Plan of Care (Signed)
Problem: Health Behavior/Discharge Planning: Goal: Ability to manage health-related needs will improve Outcome: Completed/Met Date Met:  02/02/16 Patient going home with xarelto, pharmacy in to do education and prescription called into pharmacy.

## 2016-02-02 NOTE — Progress Notes (Signed)
  Echocardiogram 2D Echocardiogram has been performed.  Johny Chess 02/02/2016, 1:15 PM

## 2016-02-02 NOTE — Progress Notes (Signed)
ANTICOAGULATION CONSULT NOTE - Follow Up Consult  Pharmacy Consult for xarelto Indication: pulmonary embolus  No Known Allergies  Patient Measurements: Height: 5\' 9"  (175.3 cm) Weight: 145 lb 12.8 oz (66.134 kg) IBW/kg (Calculated) : 66.2  Vital Signs: Temp: 98.6 F (37 C) (03/24 1436) Temp Source: Oral (03/24 1436) BP: 113/68 mmHg (03/24 1436) Pulse Rate: 81 (03/24 1436)  Labs:  Recent Labs  01/31/16 0956 01/31/16 2144 02/01/16 0030 02/01/16 0348 02/01/16 0349 02/01/16 0826 02/02/16 0626  HGB 13.6  --   --   --  12.6  --  13.7  HCT 42.1  --   --   --  37.5  --  41.5  PLT 226  --   --   --  217  --  248  HEPARINUNFRC  --   --  0.47  --   --  0.49 0.44  CREATININE 1.05*  --   --   --  0.86  --   --   TROPONINI  --  <0.03  --  <0.03  --  <0.03  --     Estimated Creatinine Clearance: 69.9 mL/min (by C-G formula based on Cr of 0.86).  Assessment: 44 YOM on therapeutic heparin for new DVT/PE, pharmacy is consulted to transition anticoagulation to xarelto. crcl ~ 70 ml/min.   Goal of Therapy:  Monitor platelets by anticoagulation protocol: Yes   Plan:  Xarelto 15 mg po BID for 21 days, then switch to 20 mg daily on 02/24/2016 Stop IV heparin at the same time when patient start the first dose of xarelto.   Maryanna Shape, PharmD, BCPS  Clinical Pharmacist  Pager: [phone]   02/02/2016,3:20 PM

## 2016-02-02 NOTE — Progress Notes (Signed)
ANTICOAGULATION CONSULT NOTE - Follow-up Consult  Pharmacy Consult for Heparin  Indication: pulmonary embolus  No Known Allergies  Patient Measurements: Height: 5\' 9"  (175.3 cm) Weight: 145 lb 12.8 oz (66.134 kg) IBW/kg (Calculated) : 66.2   Vital Signs: Temp: 98 F (36.7 C) (03/24 0500) Temp Source: Oral (03/24 0500) BP: 121/86 mmHg (03/24 0500) Pulse Rate: 70 (03/24 0500)  Labs:  Recent Labs  01/31/16 0956 01/31/16 2144 02/01/16 0030 02/01/16 0348 02/01/16 0349 02/01/16 0826 02/02/16 0626  HGB 13.6  --   --   --  12.6  --  13.7  HCT 42.1  --   --   --  37.5  --  41.5  PLT 226  --   --   --  217  --  248  HEPARINUNFRC  --   --  0.47  --   --  0.49 0.44  CREATININE 1.05*  --   --   --  0.86  --   --   TROPONINI  --  <0.03  --  <0.03  --  <0.03  --     Estimated Creatinine Clearance: 69.9 mL/min (by C-G formula based on Cr of 0.86).   Assessment: 65 yo female presenting with SOB/leg swelling  PMH: Hypothyroid, HLD, hyperglycemia, anxiety, on estrogen therapy  Anticoagulation: Heparin 1000 units/hr for PE, HL x 3  Cardiovascular: HLD (no meds) - PE/DVT with right heart strain  Nephrology: Scr 0.86  Heme: H&H 13.7/41.5, Plt 248  Goal of Therapy:  Heparin level 0.3-0.7 units/ml Monitor platelets by anticoagulation protocol: Yes   Plan:  Continue Heparin at 1000 units/hr Daily HL, CBC Monitor for s/sx of bleeding F/U Long-term [facility] Plans  Levester Fresh, PharmD, BCPS, [facility] Clinical Pharmacist Pager [phone] 02/02/2016 8:36 AM

## 2016-02-02 NOTE — Discharge Instructions (Addendum)
Follow with Sr. Billey Gosling in 3-4 weeks  Please take Xarelto starter pack 15 mg twice daily for 21 days then 20 mg daily thereafter.   After you finish the starter pack take Xarelto 20 mg daily  Please get a complete blood count and chemistry panel checked by your Primary MD at your next visit, and again as instructed by your Primary MD. Please get your medications reviewed and adjusted by your Primary MD.  Please request your Primary MD to go over all Hospital Tests and Procedure/Radiological results at the follow up, please get all Hospital records sent to your Prim MD by signing hospital release before you go home.  If you had Pneumonia of Lung problems at the Hospital: Please get a 2 view Chest X ray done in 6-8 weeks after hospital discharge or sooner if instructed by your Primary MD.  If you have Congestive Heart Failure: Please call your Cardiologist or Primary MD anytime you have any of the following symptoms:  1) 3 pound weight gain in 24 hours or 5 pounds in 1 week  2) shortness of breath, with or without a dry hacking cough  3) swelling in the hands, feet or stomach  4) if you have to sleep on extra pillows at night in order to breathe  Follow cardiac low salt diet and 1.5 lit/day fluid restriction.  If you have diabetes Accuchecks 4 times/day, Once in AM empty stomach and then before each meal. Log in all results and show them to your primary doctor at your next visit. If any glucose reading is under 80 or above 300 call your primary MD immediately.  If you have Seizure/Convulsions/Epilepsy: Please do not drive, operate heavy machinery, participate in activities at heights or participate in high speed sports until you have seen by Primary MD or a Neurologist and advised to do so again.  If you had Gastrointestinal Bleeding: Please ask your Primary MD to check a complete blood count within one week of discharge or at your next visit. Your endoscopic/colonoscopic biopsies  that are pending at the time of discharge, will also need to followed by your Primary MD.  Get Medicines reviewed and adjusted. Please take all your medications with you for your next visit with your Primary MD  Please request your Primary MD to go over all hospital tests and procedure/radiological results at the follow up, please ask your Primary MD to get all Hospital records sent to his/her office.  If you experience worsening of your admission symptoms, develop shortness of breath, life threatening emergency, suicidal or homicidal thoughts you must seek medical attention immediately by calling 911 or calling your MD immediately  if symptoms less severe.  You must read complete instructions/literature along with all the possible adverse reactions/side effects for all the Medicines you take and that have been prescribed to you. Take any new Medicines after you have completely understood and accpet all the possible adverse reactions/side effects.   Do not drive or operate heavy machinery when taking Pain medications.   Do not take more than prescribed Pain, Sleep and Anxiety Medications  Special Instructions: If you have smoked or chewed Tobacco  in the last 2 yrs please stop smoking, stop any regular Alcohol  and or any Recreational drug use.  Wear Seat belts while driving.  Please note You were cared for by a hospitalist during your hospital stay. If you have any questions about your discharge medications or the care you received while you were in the  hospital after you are discharged, you can call the unit and asked to speak with the hospitalist on call if the hospitalist that took care of you is not available. Once you are discharged, your primary care physician will handle any further medical issues. Please note that NO REFILLS for any discharge medications will be authorized once you are discharged, as it is imperative that you return to your primary care physician (or establish a  relationship with a primary care physician if you do not have one) for your aftercare needs so that they can reassess your need for medications and monitor your lab values.  You can reach the hospitalist office at phone [phone] or fax [phone]   If you do not have a primary care physician, you can call [phone] for a physician referral.  Activity: As tolerated with Full fall precautions use walker/cane & assistance as needed  Diet: regular  Disposition Home     ------------------------- Information on my medicine - XARELTO (rivaroxaban)  This medication education was reviewed with me or my healthcare representative as part of my discharge preparation.    WHY WAS XARELTO PRESCRIBED FOR YOU? Xarelto was prescribed to treat blood clots that may have been found in the veins of your legs (deep vein thrombosis) or in your lungs (pulmonary embolism) and to reduce the risk of them occurring again.  What do you need to know about Xarelto? The starting dose is one 15 mg tablet taken TWICE daily with food for the FIRST 21 DAYS then on 02/24/2016 the dose is changed to one 20 mg tablet taken ONCE A DAY with your evening meal.  DO NOT stop taking Xarelto without talking to the health care provider who prescribed the medication.  Refill your prescription for 20 mg tablets before you run out.  After discharge, you should have regular check-up appointments with your healthcare provider that is prescribing your Xarelto.  In the future your dose may need to be changed if your kidney function changes by a significant amount.  What do you do if you miss a dose? If you are taking Xarelto TWICE DAILY and you miss a dose, take it as soon as you remember. You may take two 15 mg tablets (total 30 mg) at the same time then resume your regularly scheduled 15 mg twice daily the next day.  If you are taking Xarelto ONCE DAILY and you miss a dose, take it as soon as you remember on the same day  then continue your regularly scheduled once daily regimen the next day. Do not take two doses of Xarelto at the same time.   Important Safety Information Xarelto is a blood thinner medicine that can cause bleeding. You should call your healthcare provider right away if you experience any of the following: ? Bleeding from an injury or your nose that does not stop. ? Unusual colored urine (red or dark brown) or unusual colored stools (red or black). ? Unusual bruising for unknown reasons. ? A serious fall or if you hit your head (even if there is no bleeding).  Some medicines may interact with Xarelto and might increase your risk of bleeding while on Xarelto. To help avoid this, consult your healthcare provider or pharmacist prior to using any new prescription or non-prescription medications, including herbals, vitamins, non-steroidal anti-inflammatory drugs (NSAIDs) and supplements.  This website has more information on Xarelto: [email or web address]

## 2016-02-02 NOTE — Discharge Summary (Signed)
Physician Discharge Summary  Cia Sharf [id number] DOB: July 07, 1951 DOA: 01/31/2016  PCP: Unice Cobble, MD, Dr. Marzetta Board Borns  Admit date: 01/31/2016 Discharge date: 02/02/2016  Time spent: > 30 minutes  Recommendations for Outpatient Follow-up:  1. Follow up with Dr. Quay Burow in 3-4 weeks 2. Patient started on Xarelto for new onset PE / DVT   Discharge Diagnoses:  Active Problems:   Pulmonary emboli [facility])   Pulmonary embolism [facility])  Discharge Condition: stable  Diet recommendation: regular  Filed Weights   01/31/16 2113 02/01/16 0300 02/02/16 0500  Weight: 68.402 kg (150 lb 12.8 oz) 67.087 kg (147 lb 14.4 oz) 66.134 kg (145 lb 12.8 oz)    History of present illness:  Per Dr. Roel Cluck,  ""Alaisa Curless is a 65 y.o. female has a past medical history of Hypothyroidism; Hyperlipidemia; Fasting hyperglycemia; Popliteal cyst; Anxiety; Cancer ([place]); and Cataract. Presented with mild shortness of breath for past few days. Worsened dyspnea on exertion. She has had limited mobility secondary to recent hip dislocation her right knee immobilizer has been removed last week Thursday but she has been still trying not to move too much. She reports some intermittent chest discomfort not true pain. Her right calf hurts if you touch it. No Fever no chills. She is on estrogen therapy secondary to early hysterectomy and has never came off of it. No recent bleeding no blood in stool. Last colonoscopy was in December and it was normal up to date on Mammograms. 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   IN ER: He was noted to have right cough tenderness. Afebrile heart rate 82 blood pressure 132/87 satting 90% on room air creatinine 1.05 white blood cell count 7.2 platelets 226 hemoglobin 13.6. EKG showed left bundle-branch block CT Lennette Bihari showing acute PE with CT evidence of right heart strain with mild groundglass opacity  bilaterally possibly very mild edema. Lower extremity Doppler showed DVT in the right gastroc pains and mid right posterior tibial veins Case has been discussed with CCM Dr. Ashok Cordia who at this point feels is no indication for acute thrombolytics. Admit to medicine Patient was started on heparin Regarding pertinent past history: Patient has osteoma of heart palate. Patient status post right hip replacement on eighth of March she was stretching and had right hip dislocated. She has not had prior problems with this in the past. She presented to emergency department and had reduction with right knee immobilizer applied Hospitalist was called for admission for pulmonary embolism in the setting of recent immobilization secondary to hip dislocation""    Hospital Course:   PE / DVT - patient was admitted to the hospital with new onset PE in the setting of immobility following hip dislocation. She was started on a heparin gtt. PCCM was consulted by admitting MD and did not recommend thrombolysis. Her vital signs remained stable, she was not hypotensive or tachycardic and her chest pain has resolved. Troponin was negative. BNP was within normal limits. She underwent a 2D echo which showed diastolic flattening of the LV septum suggestive on increased right sided pressure, however RV cavity size was normal and systolic function was normal. Alternatives and anticoagulation options were discussed with the patient and she prefers Xarelto which was started prior to discharge. Her estrogen therapy was discontinued. Lower extremity Doppler showed DVT in the right gastroc pains and mid right posterior tibial veins. On the day of discharge, patient is back to baseline,  vitals are stable, she is able to ambulate in the room without chest pain / dyspnea and will be discharged home in stable condition. Her PE appears to have been provoked due to recent immobility and may need a 6 month course, final decision regarding  anticoagulation length deferred to outpatient primary MD. Acute hypoxic respiratory failure - requiring 2L  on admission, this was weaned off and patient is comfortable on room air.  Hypothyroidism - continue synthroid. TSH was normal at 2.9  Procedures:  2D echo Study Conclusions - Left ventricle: The cavity size was normal. Wall thickness was normal. Systolic function was normal. The estimated ejection  fraction was in the range of 50% to 55%. Wall motion was normal; there were no regional wall motion abnormalities. Doppler  parameters are consistent with abnormal left ventricular relaxation (grade 1 diastolic dysfunction). - Ventricular septum: The contour showed diastolic flattening. - Aortic valve: Trileaflet; mildly thickened, mildly calcified leaflets. There was mild regurgitation. - Mitral valve: There was mild regurgitation.  Impressions: - Diastolic flattening of left ventricular septum is suggestive of increased right sided pressure. Unable to estimate secondary to  unattainable tricuspid regurgitation Doppler signal.   Right ventricle: The cavity size was normal. Wall thickness was normal. Systolic function was normal.  Consultations:  None   Discharge Exam: Filed Vitals:   02/01/16 1612 02/01/16 1936 02/02/16 0500 02/02/16 1436  BP: 141/90 126/100 121/86 113/68  Pulse: 85 75 70 81  Temp: 97.7 F (36.5 C) 97.5 F (36.4 C) 98 F (36.7 C) 98.6 F (37 C)  TempSrc: Oral Axillary Oral Oral  Resp:  20 16   Height:      Weight:   66.134 kg (145 lb 12.8 oz)   SpO2: 96% 95% 94% 97%    General: NAD Cardiovascular: RRR Respiratory: CTA biL  Discharge Instructions Activity:  As tolerated   Get Medicines reviewed and adjusted: Please take all your medications with you for your next visit with your Primary MD  Please request your Primary MD to go over all hospital tests and procedure/radiological results at the follow up, please ask your Primary MD to get  all Hospital records sent to his/her office.  If you experience worsening of your admission symptoms, develop shortness of breath, life threatening emergency, suicidal or homicidal thoughts you must seek medical attention immediately by calling 911 or calling your MD immediately if symptoms less severe.  You must read complete instructions/literature along with all the possible adverse reactions/side effects for all the Medicines you take and that have been prescribed to you. Take any new Medicines after you have completely understood and accpet all the possible adverse reactions/side effects.   Do not drive when taking Pain medications.   Do not take more than prescribed Pain, Sleep and Anxiety Medications  Special Instructions: If you have smoked or chewed Tobacco in the last 2 yrs please stop smoking, stop any regular Alcohol and or any Recreational drug use.  Wear Seat belts while driving.  Please note  You were cared for by a hospitalist during your hospital stay. Once you are discharged, your primary care physician will handle any further medical issues. Please note that NO REFILLS for any discharge medications will be authorized once you are discharged, as it is imperative that you return to your primary care physician (or establish a relationship with a primary care physician if you do not have one) for your aftercare needs so that they can reassess your need for medications and monitor  your lab values.    Medication List    STOP taking these medications        amoxicillin-clavulanate 875-125 MG tablet  Commonly known as:  AUGMENTIN     aspirin EC 81 MG tablet     estradiol 2 MG tablet  Commonly known as:  ESTRACE      TAKE these medications        HYDROcodone-acetaminophen 5-325 MG tablet  Commonly known as:  NORCO/VICODIN  Take 1-2 tablets by mouth every 4 (four) hours as needed for moderate pain.     levothyroxine 50 MCG tablet  Commonly known as:  SYNTHROID,  LEVOTHROID  TAKE ONE AND ONE-HALF TABLETS BY MOUTH DAILY EXCEPT ON TUESDAYS AND SATURDAYS TAKE ONE TABLET     pilocarpine 5 MG tablet  Commonly known as:  SALAGEN  Take 5 mg by mouth 4 (four) times daily.     Rivaroxaban 15 & 20 MG Tbpk  Commonly known as:  XARELTO STARTER PACK  Take as directed on package: Start with one 15mg  tablet by mouth twice a day with food. On Day 22, switch to one 20mg  tablet once a day with food.     rivaroxaban 20 MG Tabs tablet  Commonly known as:  XARELTO  Take 1 tablet (20 mg total) by mouth daily with supper. After you finish the starter pack.     venlafaxine XR 75 MG 24 hr capsule  Commonly known as:  EFFEXOR-XR  Take 1 capsule (75 mg total) by mouth at bedtime.     VITAMIN D-3 PO  Take 2,000 Units by mouth daily.       Follow-up Information    Follow up with Binnie Rail, MD. Schedule an appointment as soon as possible for a visit in 3 weeks.   Specialty:  Internal Medicine   Contact information:   [place] [address] [phone]      The results of significant diagnostics from this hospitalization (including imaging, microbiology, ancillary and laboratory) are listed below for reference.    Significant Diagnostic Studies: Dg Chest 2 View  01/31/2016  CLINICAL DATA:  Chest pain EXAM: CHEST  2 VIEW COMPARISON:  07/21/2006 FINDINGS: The heart size and mediastinal contours are within normal limits. Both lungs are clear. The visualized skeletal structures are unremarkable. IMPRESSION: No active cardiopulmonary disease. Electronically Signed   By: Inez Catalina M.D.   On: 01/31/2016 10:46   Ct Angio Chest Pe W/cm &/or Wo Cm  01/31/2016  CLINICAL DATA:  Right calf pain, chest pain, and shortness of breath. EXAM: CT ANGIOGRAPHY CHEST WITH CONTRAST TECHNIQUE: Multidetector CT imaging of the chest was performed using the standard protocol during bolus administration of intravenous contrast. Multiplanar CT image reconstructions and MIPs  were obtained to evaluate the vascular anatomy. CONTRAST:  80 mL Omnipaque 350 COMPARISON:  Chest radiographs earlier today FINDINGS: Pulmonary arterial opacification is adequate. Filling defects consistent with emboli involve segmental arterial branches in the anterior segment right upper lobe, lateral segment right middle lobe, and basilar right lower lobe segments. There is also involvement of multiple segmental and subsegmental left lower lobe branches as well as subsegmental left upper lobe branches. No central embolus is present. The right ventricle/left ventricle ratio is 4.6 cm/4.9 cm = 0.94. There is flattening of the interventricular septum. The central pulmonary arteries superior slightly enlarged, with the main pulmonary artery measuring 3.1 cm in diameter. No enlarged axillary, mediastinal, or hilar lymph nodes are identified. There is no  pleural or pericardial effusion. Major airways are patent. Minimal scarring is present in the lung apices. There is mild ground-glass opacity/mosaic attenuation bilaterally, although some of this appearance may be due to motion artifact. Subsegmental atelectasis is present in both lung bases. Visualized portion of the upper abdomen is unremarkable. Mild pectus excavatum deformity is noted, and there is mild thoracic dextroscoliosis. Review of the MIP images confirms the above findings. IMPRESSION: 1. Positive for acute PE with CT evidence of right heart strain (RV/LV Ratio = 0.94) consistent with at least submassive (intermediate risk) PE. The presence of right heart strain has been associated with an increased risk of morbidity and mortality. Please activate Code PE by paging [phone]. 2. Mild ground-glass opacity bilaterally, possibly reflecting very mild edema. Critical Value/emergent results were called by telephone at the time of interpretation on 01/31/2016 at 5:47 pm to Dr. Tomi Bamberger, who verbally acknowledged these results. Electronically Signed   By: Logan Bores M.D.   On: 01/31/2016 17:48   Dg Hip Port Unilat With Pelvis 1v Right  01/17/2016  CLINICAL DATA:  Post reduction right hip EXAM: DG HIP (WITH OR WITHOUT PELVIS) 1V PORT RIGHT COMPARISON:  01/17/2016 FINDINGS: Satisfactory reduction of right hip dislocation. Hip prosthesis in satisfactory position alignment. No fracture IMPRESSION: Satisfactory reduction of right hip dislocation. Electronically Signed   By: Franchot Gallo M.D.   On: 01/17/2016 18:00   Dg Hip Unilat With Pelvis 2-3 Views Right  01/17/2016  CLINICAL DATA:  Stretching in bed and felt right hip popped out EXAM: DG HIP (WITH OR WITHOUT PELVIS) 2-3V RIGHT COMPARISON:  None. FINDINGS: Previous bilateral total hip arthroplasty. There has been superior dislocation of the right hip arthroplasty device. The left hip appears located. IMPRESSION: 1. Interval superior dislocation of the right hip arthroplasty device. Electronically Signed   By: Kerby Moors M.D.   On: 01/17/2016 16:31   Labs: Basic Metabolic Panel:  Recent Labs Lab 01/31/16 0956 02/01/16 0349  NA 140 141  K 4.5 3.7  CL 106 105  CO2 24 28  GLUCOSE 110* 94  BUN 12 7  CREATININE 1.05* 0.86  CALCIUM 9.2 8.7*  MG  --  2.1  PHOS  --  4.7*   Liver Function Tests:  Recent Labs Lab 02/01/16 0349  AST 15  ALT 11*  ALKPHOS 61  BILITOT 0.5  PROT 5.9*  ALBUMIN 3.1*   CBC:  Recent Labs Lab 01/31/16 0956 02/01/16 0349 02/02/16 0626  WBC 7.2 5.2 5.4  HGB 13.6 12.6 13.7  HCT 42.1 37.5 41.5  MCV 90.7 90.1 90.0  PLT 226 217 248   Cardiac Enzymes:  Recent Labs Lab 01/31/16 2144 02/01/16 0348 02/01/16 0826  TROPONINI <0.03 <0.03 <0.03   BNP: BNP (last 3 results)  Recent Labs  01/31/16 1919  BNP 30.4    Signed:  Gage Weant  [facility] 02/02/2016, 3:51 PM

## 2016-02-20 ENCOUNTER — Ambulatory Visit (INDEPENDENT_AMBULATORY_CARE_PROVIDER_SITE_OTHER): Payer: BLUE CROSS/BLUE SHIELD | Admitting: Internal Medicine

## 2016-02-20 ENCOUNTER — Encounter: Payer: Self-pay | Admitting: Internal Medicine

## 2016-02-20 VITALS — BP 124/84 | HR 93 | Temp 97.6°F | Resp 16 | Wt 147.0 lb

## 2016-02-20 DIAGNOSIS — I82441 Acute embolism and thrombosis of right tibial vein: Secondary | ICD-10-CM | POA: Diagnosis not present

## 2016-02-20 DIAGNOSIS — I82409 Acute embolism and thrombosis of unspecified deep veins of unspecified lower extremity: Secondary | ICD-10-CM | POA: Insufficient documentation

## 2016-02-20 DIAGNOSIS — I2609 Other pulmonary embolism with acute cor pulmonale: Secondary | ICD-10-CM

## 2016-02-20 DIAGNOSIS — F419 Anxiety disorder, unspecified: Secondary | ICD-10-CM | POA: Insufficient documentation

## 2016-02-20 DIAGNOSIS — E038 Other specified hypothyroidism: Secondary | ICD-10-CM

## 2016-02-20 DIAGNOSIS — S73004A Unspecified dislocation of right hip, initial encounter: Secondary | ICD-10-CM | POA: Insufficient documentation

## 2016-02-20 MED ORDER — LEVOTHYROXINE SODIUM 50 MCG PO TABS: 50.0000 ug | ORAL_TABLET | Freq: Every day | ORAL | Status: DC

## 2016-02-20 MED ORDER — VENLAFAXINE HCL ER 75 MG PO CP24: 75.0000 mg | ORAL_CAPSULE | Freq: Every day | ORAL | Status: DC

## 2016-02-20 MED ORDER — RIVAROXABAN 20 MG PO TABS: 20.0000 mg | ORAL_TABLET | Freq: Every day | ORAL | Status: DC

## 2016-02-20 NOTE — Assessment & Plan Note (Signed)
Discussed possibly tapering off the medication-she is unsure if she is to eat that We will consider tapering her off the near future, but for right now we will continue her current dose

## 2016-02-20 NOTE — Progress Notes (Signed)
Subjective:    Patient ID: Danielle Pratt, female    DOB: 1951-02-19, 65 y.o.   MRN: [id number]  HPI She is here for hospital follow up.   She was in the hospital from 3/22 - 3/24 for pulmonary embolism/dvt.  She presented to the ED with mild shortness of breath for a few days, according to the ED note, but she is unsure if she was having any SOB. She did have pain with palpation in her right calf, but no swelling.  She had a rippling feeling in her chest, but denies every having chest discomfort.     She has a history of tonsillar cancer 15-20 years ago that was treated with surgery and radiation and is in remission, on estrogen therapy s/p hysterectomy, hypothyroidism, hyperlipidemia, hyperglycemia, anxiety, s/p right hip dislocation who was in a leg immobilizer and was very sedentary for two weeks.    LE doppler: DVT in right Gastrocnemious veins and mid right posterior tibial veins Ct chest: acute PE with evidence of right heart strain with mild groundglass opacity b/l representing possible early edema Echo: - Left ventricle: The cavity size was normal. Wall thickness was  normal. Systolic function was normal. The estimated ejection  fraction was in the range of 50% to 55%. Wall motion was normal;  there were no regional wall motion abnormalities. Doppler  parameters are consistent with abnormal left ventricular  relaxation (grade 1 diastolic dysfunction). - Ventricular septum: The contour showed diastolic flattening indicating increased right sided pressure - Aortic valve: Trileaflet; mildly thickened, mildly calcified  leaflets. There was mild regurgitation. - Mitral valve: There was mild regurgitation.  She was started on a heparin drip.  She remained stable during the hospital course.  Her estrogen therapy was discontinued upon discharge. She denies any side effects including hot flashes.  She has felt fine since being discharged.  She denies chest pain or discomfort,  sob, palpitations, headaches and lightheadedness.  She denies leg swelling.  She has some discomfort in the right upper calf with palpation only. She is taking xarelto without side effects.  Hypothyroidism:  She is taking her medication daily.  She denies any recent changes in energy or weight that are unexplained.   Hip dislocation:  She had a right hip replacement years ago and has had 4 or dislocation in the past couple of years. She is following with orthopedics and will be starting physical therapy syncope.  Anxiety: She is taking Effexor daily as prescribed. Medication was started when she was diagnosed with cancer. She wonders if she still needs it, but has not tried discontinuing it now since she is on the blood thinner.  Medications and allergies reviewed with patient and updated if appropriate.  Patient Active Problem List   Diagnosis Date Noted  . Pulmonary emboli ([place]) 01/31/2016  . Pulmonary embolism ([place]) 01/31/2016  . Acute sinusitis 12/20/2015  . Multiple pigmented nevi 07/28/2015  . Unruptured popliteal cyst 09/07/2013  . HYPERLIPIDEMIA 01/01/2010  . LEFT BUNDLE BRANCH BLOCK 01/01/2010  . OSTEOARTHRITIS 01/01/2010  . Malignant neoplasm of tonsillar fossa ([place]) 12/27/2008  . HYPOTHYROIDISM 07/17/2007    Current Outpatient Prescriptions on File Prior to Visit  Medication Sig Dispense Refill  . Cholecalciferol (VITAMIN D-3 PO) Take 2,000 Units by mouth daily.    [name] levothyroxine (SYNTHROID, LEVOTHROID) 50 MCG tablet TAKE ONE AND ONE-HALF TABLETS BY MOUTH DAILY EXCEPT ON TUESDAYS AND SATURDAYS TAKE ONE TABLET (Patient taking differently: Take 50-75 mcg by mouth daily before breakfast.  TAKE ONE AND ONE-HALF= 46mcg  TABLETS BY MOUTH DAILY; EXCEPT ON TUESDAYS AND SATURDAYS TAKE ONE TABLET= 82mcg) 90 tablet 3  . pilocarpine (SALAGEN) 5 MG tablet Take 5 mg by mouth 4 (four) times daily.    . Rivaroxaban (XARELTO STARTER PACK) 15 & 20 MG TBPK Take as directed on package: Start with  one 15mg  tablet by mouth twice a day with food. On Day 22, switch to one 20mg  tablet once a day with food. 51 each 0  . rivaroxaban (XARELTO) 20 MG TABS tablet Take 1 tablet (20 mg total) by mouth daily with supper. After you finish the starter pack. 30 tablet 1  . venlafaxine XR (EFFEXOR-XR) 75 MG 24 hr capsule Take 1 capsule (75 mg total) by mouth at bedtime. (Patient taking differently: Take 75 mg by mouth daily with breakfast. ) 90 capsule 1   No current facility-administered medications on file prior to visit.    Past Medical History  Diagnosis Date  . Hypothyroidism   . Hyperlipidemia   . Fasting hyperglycemia     110 in 01/2011;131 in 05/2007  . Popliteal cyst   . Anxiety   . Cancer ([place])     [place] ca 2000  . Cataract     extraction surgery    Past Surgical History  Procedure Laterality Date  . Cesarean section      G 1 P 2  . Total abdominal hysterectomy w/ bilateral salpingoophorectomy      fibroids; Dr Nori Riis  . Total hip arthroplasty  2007    Dr Maureen Ralphs, both hips 3 months apart  . Tonsillectomy and adenoidectomy  1998    R tonsilar malignancy; subsequent LN dissection negative  . Colonoscopy  2003    negative; Dr Henrene Pastor  . Lymphadenectomy Right     neck    Social History   Social History  . Marital Status: Married    Spouse Name: N/A  . Number of Children: N/A  . Years of Education: N/A   Social History Main Topics  . Smoking status: Never Smoker   . Smokeless tobacco: Never Used  . Alcohol Use: 0.0 oz/week    0 Standard drinks or equivalent per week     Comment:  3/week  . Drug Use: No  . Sexual Activity: Not on file   Other Topics Concern  . Not on file   Social History Narrative    Family History  Problem Relation Age of Onset  . Hyperlipidemia Mother   . Hypertension Mother   . Ovarian cancer Mother   . Aneurysm Mother     ascending & descending aorta  . Heart attack Father 29  . Leukemia Paternal Grandmother   . Lung cancer Paternal  Grandfather   . Diabetes Neg Hx   . Stroke Neg Hx   . Colon cancer Neg Hx   . Stomach cancer Neg Hx   . Rectal cancer Neg Hx     Review of Systems  Constitutional: Negative for fever, chills and appetite change.  Respiratory: Negative for cough, shortness of breath and wheezing.   Cardiovascular: Negative for chest pain, palpitations and leg swelling.  Gastrointestinal: Negative for nausea, abdominal pain, diarrhea, constipation and blood in stool.  Genitourinary: Negative for hematuria.  Neurological: Negative for dizziness, light-headedness and headaches.       Objective:   Filed Vitals:   02/20/16 1556  BP: 124/84  Pulse: 93  Temp: 97.6 F (36.4 C)  Resp: 16   Filed Weights  02/20/16 1556  Weight: 147 lb (66.679 kg)   Body mass index is 21.7 kg/(m^2).   Physical Exam Constitutional: Appears well-developed and well-nourished. No distress.  Neck: Neck supple. No tracheal deviation present. No thyromegaly present.  No carotid bruit. No cervical adenopathy.   Cardiovascular: Normal rate, regular rhythm and normal heart sounds.   No murmur heard.  No edema Pulmonary/Chest: Effort normal and breath sounds normal. No respiratory distress. No wheezes.  Extremities: no edema in legs, mild tenderness right medial upper lower calf, no skin changes    Assessment & Plan:    See Problem List for Assessment and Plan of chronic medical problems.  Follow up in fall for PE, sooner if needed

## 2016-02-20 NOTE — Assessment & Plan Note (Signed)
Levothyroxine dose has been stable-we will continue current dose Refill given Recheck TSH in 6 months

## 2016-02-20 NOTE — Patient Instructions (Addendum)
   Medications reviewed and updated.  No changes recommended at this time.  Your prescription(s) have been submitted to your pharmacy. Please take as directed and contact our office if you believe you are having problem(s) with the medication(s).  Please followup in the fall for a physical exam.

## 2016-02-20 NOTE — Assessment & Plan Note (Signed)
Secondary to being sedentary due to hip dislocation and also on estradiol  Xarelto for 6 months

## 2016-02-20 NOTE — Progress Notes (Signed)
Pre visit review using our clinic review tool, if applicable. No additional management support is needed unless otherwise documented below in the visit note. 

## 2016-02-26 ENCOUNTER — Encounter: Payer: Self-pay | Admitting: Family

## 2016-02-26 ENCOUNTER — Ambulatory Visit (INDEPENDENT_AMBULATORY_CARE_PROVIDER_SITE_OTHER): Payer: BLUE CROSS/BLUE SHIELD | Admitting: Family

## 2016-02-26 VITALS — BP 126/90 | HR 91 | Temp 97.5°F | Wt 149.0 lb

## 2016-02-26 DIAGNOSIS — H1089 Other conjunctivitis: Secondary | ICD-10-CM | POA: Diagnosis not present

## 2016-02-26 DIAGNOSIS — H109 Unspecified conjunctivitis: Secondary | ICD-10-CM

## 2016-02-26 DIAGNOSIS — A499 Bacterial infection, unspecified: Secondary | ICD-10-CM

## 2016-02-26 MED ORDER — CIPROFLOXACIN HCL 0.3 % OP SOLN: OPHTHALMIC | Status: DC

## 2016-02-26 NOTE — Progress Notes (Signed)
Subjective:    Patient ID: Danielle Pratt, female    DOB: 1951/01/03, 65 y.o.   MRN: [id number]   Danielle Pratt is a 65 y.o. female who presents today for an acute visit.    HPI Comments: Recent DVT to PE; on Xarelto 3/23. Marland Kitchendenies gritty sensation or foreign body sensation in her left eyepatient does not wear contacts.  Conjunctivitis  The current episode started 3 to 5 days ago. The problem occurs rarely. The problem has been gradually improving. The problem is mild. Nothing relieves the symptoms. Exacerbated by: seasonal allergies. Associated symptoms include eye discharge (scant green discharge in corner of left eye) and eye redness. Pertinent negatives include no fever, no decreased vision, no double vision, no eye itching, no photophobia, no nausea, no vomiting, no congestion, no ear discharge, no ear pain, no headaches, no rhinorrhea, no sore throat, no cough, no wheezing, no rash and no eye pain.   Past Medical History  Diagnosis Date  . Hypothyroidism   . Hyperlipidemia   . Fasting hyperglycemia     110 in 01/2011;131 in 05/2007  . Popliteal cyst   . Anxiety   . Cancer ([place])     [place] ca 2000  . Cataract     extraction surgery   Review of patient's allergies indicates no known allergies. Ms. Taube had no medications administered during this visit. Social History  Substance Use Topics  . Smoking status: Never Smoker   . Smokeless tobacco: Never Used  . Alcohol Use: 0.0 oz/week    0 Standard drinks or equivalent per week     Comment:  3/week    Review of Systems  Constitutional: Negative for fever, chills and unexpected weight change.  HENT: Negative for congestion, ear discharge, ear pain, rhinorrhea, sinus pressure and sore throat.   Eyes: Positive for discharge (scant green discharge in corner of left eye) and redness. Negative for double vision, photophobia, pain, itching and visual disturbance.  Respiratory: Negative for cough, shortness of breath and wheezing.    Cardiovascular: Negative for chest pain, palpitations and leg swelling.  Gastrointestinal: Negative for nausea and vomiting.  Skin: Negative for rash.  Neurological: Negative for headaches.      Objective:    BP 126/90 mmHg  Pulse 91  Temp(Src) 97.5 F (36.4 C) (Oral)  Wt 149 lb (67.586 kg)  SpO2 92%   Physical Exam  Constitutional: She appears well-developed and well-nourished.  HENT:  Head: Normocephalic and atraumatic.  Right Ear: Hearing, tympanic membrane, external ear and ear canal normal. No drainage, swelling or tenderness. No foreign bodies. Tympanic membrane is not erythematous and not bulging. No middle ear effusion. No decreased hearing is noted.  Left Ear: Hearing, tympanic membrane, external ear and ear canal normal. No drainage, swelling or tenderness. No foreign bodies. Tympanic membrane is not erythematous and not bulging.  No middle ear effusion. No decreased hearing is noted.  Nose: No rhinorrhea. Right sinus exhibits no maxillary sinus tenderness and no frontal sinus tenderness. Left sinus exhibits no maxillary sinus tenderness and no frontal sinus tenderness.  Mouth/Throat: Uvula is midline, oropharynx is clear and moist and mucous membranes are normal. No oropharyngeal exudate, posterior oropharyngeal edema, posterior oropharyngeal erythema or tonsillar abscesses.  Eyes: EOM are normal. Pupils are equal, round, and reactive to light. Lids are everted and swept, no foreign bodies found. Right eye exhibits no discharge. Left eye exhibits no discharge and no hordeolum. Right conjunctiva is not injected. Right conjunctiva has no hemorrhage. Left conjunctiva  is injected. Left conjunctiva has no hemorrhage. No scleral icterus.  No external eye lesions. Surrounding skin intact.   Left eye:   Diffuse injection of the conjunctiva. No white spots, opacity, or foreign body appreciated. No collection of blood or pus in the anterior chamber. No ciliary flush surrounding iris.     No photophobia or eye pain appreciated during exam.   Cardiovascular: Regular rhythm, normal heart sounds and normal pulses.   Pulmonary/Chest: Effort normal and breath sounds normal. She has no wheezes. She has no rhonchi. She has no rales.  Neurological: She is alert.  Skin: Skin is warm and dry.  Psychiatric: She has a normal mood and affect. Her speech is normal and behavior is normal. Thought content normal.  Vitals reviewed.      Assessment & Plan:   1. Bacterial conjunctivitis of left eye Bacterial conjunctivitis versus viral. Patient agreed to treat empirically for bacterial conjunctivitis with antibiotic op. Alternatively, patient and I discussed that the xarelto may be causing the redness in her eye. However I would suspect the patient to have a cough and would perhaps see redness in bilateral eyes which isn't the case.  I am having Ms. Gunnoe maintain her pilocarpine, Cholecalciferol (VITAMIN D-3 PO), rivaroxaban, rivaroxaban, levothyroxine, venlafaxine XR, and ciprofloxacin.   Meds ordered this encounter  Medications  . DISCONTD: ciprofloxacin (CILOXAN) 0.3 % ophthalmic solution    Sig: 1-2 gtt in eye(s) q2h while awake x 2 days, then q4h x 5 days.    Dispense:  5 mL    Refill:  0    Order Specific Question:  Supervising Provider    Answer:  Cassandria Anger [[id number]]  . ciprofloxacin (CILOXAN) 0.3 % ophthalmic solution    Sig: 1-2 gtt in eye(s) q2h while awake x 2 days, then q4h x 5 days.    Dispense:  5 mL    Refill:  0    reprint     Start medications as prescribed and explained to patient on After Visit Summary ( AVS). Risks, benefits, and alternatives of the medications and treatment plan prescribed today were discussed, and patient expressed understanding.   Education regarding symptom management and diagnosis given to patient.   Follow-up:Plan follow-up as discussed or as needed if any worsening symptoms or change in condition. No Follow-up on file.    Continue to follow with Binnie Rail, MD for routine health maintenance.   [name] and I agreed with plan.   Mable Paris, FNP

## 2016-02-26 NOTE — Progress Notes (Signed)
Pre visit review using our clinic review tool, if applicable. No additional management support is needed unless otherwise documented below in the visit note. 

## 2016-02-26 NOTE — Patient Instructions (Signed)
If there is no improvement in your symptoms, or if there is any worsening of symptoms, or if you have any additional concerns, please return for re-evaluation; or, if we are closed, consider going to the Emergency Room for evaluation if symptoms urgent.  Bacterial Conjunctivitis Bacterial conjunctivitis, commonly called pink eye, is an inflammation of the clear membrane that covers the white part of the eye (conjunctiva). The inflammation can also happen on the underside of the eyelids. The blood vessels in the conjunctiva become inflamed, causing the eye to become red or pink. Bacterial conjunctivitis may spread easily from one eye to another and from person to person (contagious).  CAUSES  Bacterial conjunctivitis is caused by bacteria. The bacteria may come from your own skin, your upper respiratory tract, or from someone else with bacterial conjunctivitis. SYMPTOMS  The normally white color of the eye or the underside of the eyelid is usually pink or red. The pink eye is usually associated with irritation, tearing, and some sensitivity to light. Bacterial conjunctivitis is often associated with a thick, yellowish discharge from the eye. The discharge may turn into a crust on the eyelids overnight, which causes your eyelids to stick together. If a discharge is present, there may also be some blurred vision in the affected eye. DIAGNOSIS  Bacterial conjunctivitis is diagnosed by your caregiver through an eye exam and the symptoms that you report. Your caregiver looks for changes in the surface tissues of your eyes, which may point to the specific type of conjunctivitis. A sample of any discharge may be collected on a cotton-tip swab if you have a severe case of conjunctivitis, if your cornea is affected, or if you keep getting repeat infections that do not respond to treatment. The sample will be sent to a lab to see if the inflammation is caused by a bacterial infection and to see if the infection will  respond to antibiotic medicines. TREATMENT   Bacterial conjunctivitis is treated with antibiotics. Antibiotic eyedrops are most often used. However, antibiotic ointments are also available. Antibiotics pills are sometimes used. Artificial tears or eye washes may ease discomfort. HOME CARE INSTRUCTIONS   To ease discomfort, apply a cool, clean washcloth to your eye for 10-20 minutes, 3-4 times a day.  Gently wipe away any drainage from your eye with a warm, wet washcloth or a cotton ball.  Wash your hands often with soap and water. Use paper towels to dry your hands.  Do not share towels or washcloths. This may spread the infection.  Change or wash your pillowcase every day.  You should not use eye makeup until the infection is gone.  Do not operate machinery or drive if your vision is blurred.  Stop using contact lenses. Ask your caregiver how to sterilize or replace your contacts before using them again. This depends on the type of contact lenses that you use.  When applying medicine to the infected eye, do not touch the edge of your eyelid with the eyedrop bottle or ointment tube. SEEK IMMEDIATE MEDICAL CARE IF:   Your infection has not improved within 3 days after beginning treatment.  You had yellow discharge from your eye and it returns.  You have increased eye pain.  Your eye redness is spreading.  Your vision becomes blurred.  You have a fever or persistent symptoms for more than 2-3 days.  You have a fever and your symptoms suddenly get worse.  You have facial pain, redness, or swelling. MAKE SURE YOU:  Understand these instructions.  Will watch your condition.  Will get help right away if you are not doing well or get worse.   This information is not intended to replace advice given to you by your health care provider. Make sure you discuss any questions you have with your health care provider.   Document Released: 10/28/2005 Document Revised: 11/18/2014  Document Reviewed: 03/30/2012 Elsevier Interactive Patient Education [employer].

## 2016-03-25 ENCOUNTER — Telehealth: Payer: Self-pay

## 2016-03-25 MED ORDER — RIVAROXABAN 20 MG PO TABS: 20.0000 mg | ORAL_TABLET | Freq: Every day | ORAL | Status: DC

## 2016-03-25 NOTE — Telephone Encounter (Signed)
Pt called and requested rx for xarelto. Last rx was not sent erx (listed as a "no print").   Please if advise if any changes need to be made.

## 2016-04-12 ENCOUNTER — Other Ambulatory Visit: Payer: Self-pay | Admitting: Internal Medicine

## 2016-04-12 DIAGNOSIS — Z1231 Encounter for screening mammogram for malignant neoplasm of breast: Secondary | ICD-10-CM

## 2016-05-13 ENCOUNTER — Ambulatory Visit
Admission: RE | Admit: 2016-05-13 | Discharge: 2016-05-13 | Disposition: A | Discharge status: Home / Self Care | Payer: BLUE CROSS/BLUE SHIELD | Source: Ambulatory Visit | Attending: Internal Medicine | Admitting: Internal Medicine

## 2016-05-13 DIAGNOSIS — Z1231 Encounter for screening mammogram for malignant neoplasm of breast: Secondary | ICD-10-CM

## 2016-07-25 ENCOUNTER — Other Ambulatory Visit: Payer: Self-pay | Admitting: Emergency Medicine

## 2016-07-25 MED ORDER — LEVOTHYROXINE SODIUM 50 MCG PO TABS: 50.0000 ug | ORAL_TABLET | Freq: Every day | ORAL | 1 refills | Status: DC

## 2016-07-31 ENCOUNTER — Other Ambulatory Visit: Payer: Self-pay | Admitting: Emergency Medicine

## 2016-07-31 MED ORDER — LEVOTHYROXINE SODIUM 50 MCG PO TABS: 50.0000 ug | ORAL_TABLET | Freq: Every day | ORAL | 1 refills | Status: DC

## 2016-09-02 ENCOUNTER — Other Ambulatory Visit: Payer: Self-pay | Admitting: *Deleted

## 2016-09-02 MED ORDER — VENLAFAXINE HCL ER 75 MG PO CP24: 75.0000 mg | ORAL_CAPSULE | Freq: Every day | ORAL | 0 refills | Status: DC

## 2016-09-02 MED ORDER — RIVAROXABAN 20 MG PO TABS: 20.0000 mg | ORAL_TABLET | Freq: Every day | ORAL | 0 refills | Status: DC

## 2016-09-04 NOTE — Progress Notes (Signed)
Subjective:    Patient ID: Danielle Pratt, female    DOB: 18-Aug-1951, 65 y.o.   MRN: [id number]  HPI She is here for follow up.  PE/DVT:  She was diagnosed with an acute DVT/PE in 01/2016 after being sedentary for two weeks and being on estradiol.  The hormones were stopped. She was start on xarelto then and it has been 6 months.  She stopped the medication and has started ASA 81 mg daily.  She denies leg pain, swelling in her legs, SOB and chest pain.    Hypothyroidism:  She is taking her medication daily.  She denies any recent changes in energy or weight that are unexplained.   Anxiety: She is taking her medication daily as prescribed. She denies any side effects from the medication. She feels her anxiety is well controlled and she is happy with her current dose of medication.   Last night she had flashes in her left peripheral vision.  It occurred mostly when moving her head around.  Nothing today.  No change in vision.  Last eye exma was 6 weeks ago.   Medications and allergies reviewed with patient and updated if appropriate.  Patient Active Problem List   Diagnosis Date Noted  . Hip dislocation, right ([place]) 02/20/2016  . DVT, RLE 01/31/16 02/20/2016  . Anxiety 02/20/2016  . Pulmonary emboli ([place]) 01/31/2016  . Multiple pigmented nevi 07/28/2015  . Unruptured popliteal cyst 09/07/2013  . HYPERLIPIDEMIA 01/01/2010  . LEFT BUNDLE BRANCH BLOCK 01/01/2010  . OSTEOARTHRITIS 01/01/2010  . Malignant neoplasm of tonsillar fossa ([place]) 12/27/2008  . Hypothyroidism 07/17/2007    Current Outpatient Prescriptions on File Prior to Visit  Medication Sig Dispense Refill  . Cholecalciferol (VITAMIN D-3 PO) Take 2,000 Units by mouth daily.    [name] levothyroxine (SYNTHROID, LEVOTHROID) 50 MCG tablet Take 1-1.5 tablets (50-75 mcg total) by mouth daily before breakfast. TAKE 1.5 Tabs 19mcg po, ON TUESDAYS AND SATURDAYS TAKE ONE TAB= 35mcg 90 tablet 1  . pilocarpine (SALAGEN) 5 MG tablet Take 5 mg  by mouth 4 (four) times daily.    . rivaroxaban (XARELTO) 20 MG TABS tablet Take 1 tablet (20 mg total) by mouth daily with supper. Follow-up appt is due must see MD for refills 30 tablet 0  . venlafaxine XR (EFFEXOR-XR) 75 MG 24 hr capsule Take 1 capsule (75 mg total) by mouth at bedtime. F/u appt is due must see MD for future refills 30 capsule 0   No current facility-administered medications on file prior to visit.     Past Medical History:  Diagnosis Date  . Anxiety   . Cancer ([place])    [place] ca 2000  . Cataract    extraction surgery  . Fasting hyperglycemia    110 in 01/2011;131 in 05/2007  . Hyperlipidemia   . Hypothyroidism   . Popliteal cyst     Past Surgical History:  Procedure Laterality Date  . CESAREAN SECTION     G 1 P 2  . COLONOSCOPY  2003   negative; Dr Henrene Pastor  . LYMPHADENECTOMY Right    neck  . TONSILLECTOMY AND ADENOIDECTOMY  1998   R tonsilar malignancy; subsequent LN dissection negative  . TOTAL ABDOMINAL HYSTERECTOMY W/ BILATERAL SALPINGOOPHORECTOMY     fibroids; Dr Nori Riis  . TOTAL HIP ARTHROPLASTY  2007   Dr Maureen Ralphs, both hips 3 months apart    Social History   Social History  . Marital status: Married    Spouse name: N/A  .  Number of children: N/A  . Years of education: N/A   Social History Main Topics  . Smoking status: Never Smoker  . Smokeless tobacco: Never Used  . Alcohol use 0.0 oz/week     Comment:  3/week  . Drug use: No  . Sexual activity: Not Asked   Other Topics Concern  . None   Social History Narrative  . None    Family History  Problem Relation Age of Onset  . Hyperlipidemia Mother   . Hypertension Mother   . Ovarian cancer Mother   . Aneurysm Mother     ascending & descending aorta  . Heart attack Father 62  . Leukemia Paternal Grandmother   . Lung cancer Paternal Grandfather   . Diabetes Neg Hx   . Stroke Neg Hx   . Colon cancer Neg Hx   . Stomach cancer Neg Hx   . Rectal cancer Neg Hx     Review of Systems   Constitutional: Negative for fever and unexpected weight change.  Eyes: Negative for visual disturbance.  Respiratory: Negative for cough, shortness of breath and wheezing.   Cardiovascular: Negative for chest pain, palpitations and leg swelling.  Neurological: Negative for dizziness, light-headedness and headaches.       Objective:   Vitals:   09/05/16 0855  BP: 132/86  Pulse: 94  Resp: 16  Temp: 97.8 F (36.6 C)   Filed Weights   09/05/16 0855  Weight: 155 lb (70.3 kg)   Body mass index is 22.89 kg/m.   Physical Exam Constitutional: Appears well-developed and well-nourished. No distress.  HENT:  Head: Normocephalic and atraumatic.  Neck: Neck supple. No tracheal deviation present. No thyromegaly present.  No cervical lymphadenopathy Cardiovascular: Normal rate, regular rhythm and normal heart sounds.   No murmur heard. No carotid bruit .  No edema Pulmonary/Chest: Effort normal and breath sounds normal. No respiratory distress. No has no wheezes. No rales.  Skin: Skin is warm and dry. Not diaphoretic.  Psychiatric: Normal mood and affect. Behavior is normal.         Assessment & Plan:   Advised her to call her eye doctor today and discuss the eye symptoms she had last night - should be evaluated today.  See Problem List for Assessment and Plan of chronic medical problems.

## 2016-09-05 ENCOUNTER — Encounter: Payer: Self-pay | Admitting: Internal Medicine

## 2016-09-05 ENCOUNTER — Ambulatory Visit (INDEPENDENT_AMBULATORY_CARE_PROVIDER_SITE_OTHER): Payer: BLUE CROSS/BLUE SHIELD | Admitting: Internal Medicine

## 2016-09-05 VITALS — BP 132/86 | HR 94 | Temp 97.8°F | Resp 16 | Ht 69.0 in | Wt 155.0 lb

## 2016-09-05 DIAGNOSIS — E038 Other specified hypothyroidism: Secondary | ICD-10-CM

## 2016-09-05 DIAGNOSIS — I82441 Acute embolism and thrombosis of right tibial vein: Secondary | ICD-10-CM

## 2016-09-05 DIAGNOSIS — Z23 Encounter for immunization: Secondary | ICD-10-CM | POA: Diagnosis not present

## 2016-09-05 DIAGNOSIS — I2609 Other pulmonary embolism with acute cor pulmonale: Secondary | ICD-10-CM

## 2016-09-05 DIAGNOSIS — F419 Anxiety disorder, unspecified: Secondary | ICD-10-CM | POA: Diagnosis not present

## 2016-09-05 MED ORDER — VENLAFAXINE HCL ER 75 MG PO CP24: 75.0000 mg | ORAL_CAPSULE | Freq: Every day | ORAL | 3 refills | Status: DC

## 2016-09-05 MED ORDER — ASPIRIN EC 81 MG PO TBEC: 81.0000 mg | DELAYED_RELEASE_TABLET | Freq: Every day | ORAL | Status: DC

## 2016-09-05 NOTE — Patient Instructions (Addendum)
  Flu vaccine administered today.   Medications reviewed and updated.  Changes include stopping the xarelto and starting aspirin.   Your prescription(s) have been submitted to your pharmacy. Please take as directed and contact our office if you believe you are having problem(s) with the medication(s).   Please followup in march

## 2016-09-05 NOTE — Progress Notes (Signed)
Pre visit review using our clinic review tool, if applicable. No additional management support is needed unless otherwise documented below in the visit note. 

## 2016-09-05 NOTE — Assessment & Plan Note (Signed)
Controlled, stable Continue current dose of medication  

## 2016-09-05 NOTE — Assessment & Plan Note (Signed)
Clinically euthyroid Check levels with PE in a few months Continue current dose of medication

## 2016-09-05 NOTE — Assessment & Plan Note (Signed)
S/p xarelto for 6 months On ASA 81 mg daily

## 2016-10-14 ENCOUNTER — Other Ambulatory Visit: Payer: Self-pay | Admitting: Internal Medicine

## 2016-11-17 ENCOUNTER — Emergency Department (HOSPITAL_COMMUNITY): Payer: BLUE CROSS/BLUE SHIELD

## 2016-11-17 ENCOUNTER — Emergency Department (HOSPITAL_COMMUNITY)
Admission: EM | Admit: 2016-11-17 | Discharge: 2016-11-17 | Disposition: A | Discharge status: Home / Self Care | Payer: BLUE CROSS/BLUE SHIELD | Attending: Emergency Medicine | Admitting: Emergency Medicine

## 2016-11-17 ENCOUNTER — Encounter (HOSPITAL_COMMUNITY): Payer: Self-pay

## 2016-11-17 DIAGNOSIS — Z7982 Long term (current) use of aspirin: Secondary | ICD-10-CM | POA: Diagnosis not present

## 2016-11-17 DIAGNOSIS — R079 Chest pain, unspecified: Secondary | ICD-10-CM | POA: Insufficient documentation

## 2016-11-17 DIAGNOSIS — Z96643 Presence of artificial hip joint, bilateral: Secondary | ICD-10-CM | POA: Diagnosis not present

## 2016-11-17 DIAGNOSIS — Z79899 Other long term (current) drug therapy: Secondary | ICD-10-CM | POA: Insufficient documentation

## 2016-11-17 DIAGNOSIS — Z85818 Personal history of malignant neoplasm of other sites of lip, oral cavity, and pharynx: Secondary | ICD-10-CM | POA: Insufficient documentation

## 2016-11-17 DIAGNOSIS — E039 Hypothyroidism, unspecified: Secondary | ICD-10-CM | POA: Insufficient documentation

## 2016-11-17 LAB — CBC
HCT: 43.5 % (ref 36.0–46.0)
HEMOGLOBIN: 14.7 g/dL (ref 12.0–15.0)
MCH: 30.7 pg (ref 26.0–34.0)
MCHC: 33.8 g/dL (ref 30.0–36.0)
MCV: 90.8 fL (ref 78.0–100.0)
Platelets: 286 10*3/uL (ref 150–400)
RBC: 4.79 MIL/uL (ref 3.87–5.11)
RDW: 13.8 % (ref 11.5–15.5)
WBC: 8.2 10*3/uL (ref 4.0–10.5)

## 2016-11-17 LAB — BASIC METABOLIC PANEL
ANION GAP: 14 (ref 5–15)
BUN: 17 mg/dL (ref 6–20)
CALCIUM: 10.5 mg/dL — AB (ref 8.9–10.3)
CO2: 27 mmol/L (ref 22–32)
Chloride: 96 mmol/L — ABNORMAL LOW (ref 101–111)
Creatinine, Ser: 1.08 mg/dL — ABNORMAL HIGH (ref 0.44–1.00)
GFR calc Af Amer: >60 mL/min (ref >60)
GFR, EST NON AFRICAN AMERICAN: 53 mL/min — AB (ref >60)
Glucose, Bld: 92 mg/dL (ref 65–99)
POTASSIUM: 4.2 mmol/L (ref 3.5–5.1)
SODIUM: 137 mmol/L (ref 135–145)

## 2016-11-17 LAB — I-STAT TROPONIN, ED
TROPONIN I, POC: 0.01 ng/mL (ref 0.00–0.08)
TROPONIN I, POC: 0 ng/mL (ref 0.00–0.08)

## 2016-11-17 MED ORDER — IOPAMIDOL (ISOVUE-370) INJECTION 76%
INTRAVENOUS | Status: AC
  Administered 2016-11-17: 100 mL
  Filled 2016-11-17: qty 100

## 2016-11-17 NOTE — Discharge Instructions (Signed)
Continue taking your home medications as prescribed. You may take Tylenol and/or ibuprofen as prescribed over-the-counter as needed for pain relief. I recommend following up with your primary care provider within the next week for follow-up evaluation. Please return to the Emergency Department if symptoms worsen or new onset of fever, headache, lightheadedness, dizziness, difficulty breathing, chest pain, abdominal pain, vomiting, numbness, tingling, weakness.

## 2016-11-17 NOTE — ED Notes (Signed)
Delay in lab draw, edp in with pt 

## 2016-11-17 NOTE — ED Provider Notes (Signed)
[facility] DEPT Provider Note   CSN: [id number] Arrival date & time: 11/17/16  1856     History   Chief Complaint Chief Complaint  Patient presents with  . Chest Pain    HPI Danielle Pratt is a 66 y.o. female.  HPI   Patient is a 66 year old female with history of DVT/PE who presents to the ED from home with complaint of chest pain, onset 4:30pm. Patient reports while she was at home doing laundry she had sudden onset of a sensation across her chest. Patient describes the sensation as "it felt like when you dropped a pebble into a pond and it reveals out across". Patient reports having sensation start in the middle of her chest and radiated outward down into both of her hands. Patient denies any pain to her chest but reports a sensation felt somewhat like a pressure. She reports having 3 episodes since onset, denies any pain or symptoms at this time. Patient reports having similar symptoms last March and she was diagnosed with a PE. Patient reports her episodes today at her more severe but less frequent he was diagnosed with a PE. Patient denies currently being on any anticoagulation. Denies fever, chills, headache, lightheadedness, dizziness, cough, shortness of breath, wheezing, palpitations, abdominal pain, nausea, vomiting, numbness, tingling, weakness, diaphoresis. Patient denies any personal history of cardiac disease. She notes her father has cardiac stents but states they were placed after age 14. Denies smoking. Denies taking any medications for symptoms at home.  Past Medical History:  Diagnosis Date  . Anxiety   . Cancer ([place])    [place] ca 2000  . Cataract    extraction surgery  . Fasting hyperglycemia    110 in 01/2011;131 in 05/2007  . Hyperlipidemia   . Hypothyroidism   . Popliteal cyst     Patient Active Problem List   Diagnosis Date Noted  . Hip dislocation, right ([place]) 02/20/2016  . DVT, RLE 01/31/16 02/20/2016  . Anxiety 02/20/2016  . Pulmonary emboli ([place])  01/31/2016  . Multiple pigmented nevi 07/28/2015  . Unruptured popliteal cyst 09/07/2013  . HYPERLIPIDEMIA 01/01/2010  . LEFT BUNDLE BRANCH BLOCK 01/01/2010  . OSTEOARTHRITIS 01/01/2010  . Malignant neoplasm of tonsillar fossa ([place]) 12/27/2008  . Hypothyroidism 07/17/2007    Past Surgical History:  Procedure Laterality Date  . CESAREAN SECTION     G 1 P 2  . COLONOSCOPY  2003   negative; Dr Henrene Pastor  . LYMPHADENECTOMY Right    neck  . TONSILLECTOMY AND ADENOIDECTOMY  1998   R tonsilar malignancy; subsequent LN dissection negative  . TOTAL ABDOMINAL HYSTERECTOMY W/ BILATERAL SALPINGOOPHORECTOMY     fibroids; Dr Nori Riis  . TOTAL HIP ARTHROPLASTY  2007   Dr Maureen Ralphs, both hips 3 months apart    OB History    No data available       Home Medications    Prior to Admission medications   Medication Sig Start Date End Date Taking? Authorizing Provider  aspirin EC 81 MG tablet Take 1 tablet (81 mg total) by mouth daily. Patient taking differently: Take 81 mg by mouth at bedtime.  09/05/16  Yes Binnie Rail, MD  Cholecalciferol (VITAMIN D) 2000 units tablet Take 2,000 Units by mouth at bedtime.   Yes Historical Provider, MD  clobetasol cream (TEMOVATE) AB-123456789 % Apply 1 application topically at bedtime as needed (skin irritation/ finger).   Yes Historical Provider, MD  levothyroxine (SYNTHROID, LEVOTHROID) 50 MCG tablet Take 1-1.5 tablets (50-75 mcg total) by mouth  daily before breakfast. TAKE 1.5 Tabs 20mcg po, ON TUESDAYS AND SATURDAYS TAKE ONE TAB= 64mcg Patient taking differently: Take 50-75 mcg by mouth See admin instructions. Take 1 tablet (50 mcg) by mouth on Tuesday and Saturday before breakfast, take 1 1/2 tablet (75 mcg) on Sunday, Monday, Wednesday, Thursday, Friday before breakfast. 07/31/16  Yes Binnie Rail, MD  pilocarpine (SALAGEN) 5 MG tablet Take 5 mg by mouth See admin instructions. Take 1 tablet (5 mg) by mouth 3 times daily - early morning, midday and bedtime, may take an  additional tablet during the day as needed for dry mouth.   Yes Historical Provider, MD  venlafaxine XR (EFFEXOR-XR) 75 MG 24 hr capsule Take 1 capsule (75 mg total) by mouth at bedtime. 09/05/16  Yes Binnie Rail, MD    Family History Family History  Problem Relation Age of Onset  . Hyperlipidemia Mother   . Hypertension Mother   . Ovarian cancer Mother   . Aneurysm Mother     ascending & descending aorta  . Heart attack Father 79  . Leukemia Paternal Grandmother   . Lung cancer Paternal Grandfather   . Diabetes Neg Hx   . Stroke Neg Hx   . Colon cancer Neg Hx   . Stomach cancer Neg Hx   . Rectal cancer Neg Hx     Social History Social History  Substance Use Topics  . Smoking status: Never Smoker  . Smokeless tobacco: Never Used  . Alcohol use 0.0 oz/week     Comment:  3/week     Allergies   Patient has no known allergies.   Review of Systems Review of Systems  Cardiovascular: Positive for chest pain.  All other systems reviewed and are negative.    Physical Exam Updated Vital Signs BP 131/72   Pulse 83   Temp 97.9 F (36.6 C) (Oral)   Resp 14   SpO2 96%   Physical Exam  Constitutional: She is oriented to person, place, and time. She appears well-developed and well-nourished.  HENT:  Head: Normocephalic and atraumatic.  Mouth/Throat: Uvula is midline, oropharynx is clear and moist and mucous membranes are normal. No oropharyngeal exudate, posterior oropharyngeal edema, posterior oropharyngeal erythema or tonsillar abscesses. No tonsillar exudate.  Eyes: Conjunctivae and EOM are normal. Right eye exhibits no discharge. Left eye exhibits no discharge. No scleral icterus.  Neck: Normal range of motion. Neck supple.  Cardiovascular: Normal rate, regular rhythm, normal heart sounds and intact distal pulses.   Pulmonary/Chest: Effort normal and breath sounds normal. No respiratory distress. She has no wheezes. She has no rales. She exhibits no tenderness.    Abdominal: Soft. Bowel sounds are normal. She exhibits no distension and no mass. There is no tenderness. There is no rebound and no guarding. No hernia.  Musculoskeletal: Normal range of motion. She exhibits no edema or tenderness.  Neurological: She is alert and oriented to person, place, and time.  Skin: Skin is warm and dry.  Nursing note and vitals reviewed.    ED Treatments / Results  Labs (all labs ordered are listed, but only abnormal results are displayed) Labs Reviewed  BASIC METABOLIC PANEL - Abnormal; Notable for the following:       Result Value   Chloride 96 (*)    Creatinine, Ser 1.08 (*)    Calcium 10.5 (*)    GFR calc non Af Amer 53 (*)    All other components within normal limits  CBC  I-STAT TROPOININ, ED  Randolm Idol, ED    EKG  EKG Interpretation  Date/Time:  Sunday November 17 2016 19:06:00 EST Ventricular Rate:  84 PR Interval:  152 QRS Duration: 122 QT Interval:  388 QTC Calculation: 458 R Axis:   -69 Text Interpretation:  Normal sinus rhythm Possible Left atrial enlargement Left axis deviation Left bundle branch block Abnormal ECG Confirmed by Lacinda Axon  MD, BRIAN ([id number]) on 11/17/2016 9:14:17 PM       Radiology Dg Chest 2 View  Result Date: 11/17/2016 CLINICAL DATA:  Pt complaining of chest pain that radiates to bilateral hands x this evening. Pt complaining of some lightheadedness. Pt states has hx of DVT in leg that caused a PE this past march. Pt denies any SOB. EXAM: CHEST  2 VIEW COMPARISON:  01/31/2016 FINDINGS: Cardiac silhouette is normal in size. No mediastinal or hilar masses. No evidence of adenopathy. Lungs are hyperexpanded. There is mild scarring at the apices. No evidence of pneumonia or pulmonary edema. No pleural effusion or pneumothorax. Stable pectus excavatum deformity.  Skeletal structures are intact. IMPRESSION: No acute cardiopulmonary disease. Electronically Signed   By: Lajean Manes M.D.   On: 11/17/2016 19:32   Ct Angio  Chest Pe W And/or Wo Contrast  Result Date: 11/17/2016 CLINICAL DATA:  66 y/o F; history of pulmonary embolus presenting with shortness of breath and chest pain. EXAM: CT ANGIOGRAPHY CHEST WITH CONTRAST TECHNIQUE: Multidetector CT imaging of the chest was performed using the standard protocol during bolus administration of intravenous contrast. Multiplanar CT image reconstructions and MIPs were obtained to evaluate the vascular anatomy. CONTRAST:  100 cc Isovue 370 COMPARISON:  11/17/2016 chest radiograph.  01/31/2016 chest CT. FINDINGS: Cardiovascular: Satisfactory opacification of the pulmonary arteries to the segmental level. There are several small filling defects within segmental pulmonary artery which correspond directly to pulmonary embolus on the prior CT the chest although the luminal filling defects are now are decreased in size, with synechial pattern, or marginalized. The pattern is compatible with chronic thromboembolic disease. No definite acute pulmonary embolus is identified. Normal caliber thoracic aorta. Borderline main pulmonary artery may represent underlying pulmonary artery hypertension. Normal heart size. No evidence for right heart strain. Mediastinum/Nodes: No enlarged mediastinal, hilar, or axillary lymph nodes. Thyroid gland, trachea, and esophagus demonstrate no significant findings. Lungs/Pleura: Mosaic attenuation of lung parenchyma is probably due to chronic thromboembolic disease. No consolidation, pneumothorax, or effusion. Upper Abdomen: No acute abnormality. Musculoskeletal: Mild thoracic spine degenerative changes and rightward curvature. Review of the MIP images confirms the above findings. IMPRESSION: 1. Small filling defects within segmental pulmonary arteries correspond to pulmonary emboli on the prior CTA and are now decreased in size, with a synechial pattern, and/or marginalized. Findings are compatible with chronic thromboembolic disease. No definite acute pulmonary  embolus is identified. 2. Mosaic attenuation of lung parenchyma probably due to chronic thromboembolic disease. 3. Borderline size main pulmonary artery may represent underlying pulmonary artery hypertension. Electronically Signed   By: Kristine Garbe M.D.   On: 11/17/2016 22:36    Procedures Procedures (including critical care time)  Medications Ordered in ED Medications  iopamidol (ISOVUE-370) 76 % injection (100 mLs  Contrast Given 11/17/16 2202)     Initial Impression / Assessment and Plan / ED Course  I have reviewed the triage vital signs and the nursing notes.  Pertinent labs & imaging results that were available during my care of the patient were reviewed by me and considered in my medical decision making (see chart for details).  Clinical Course     Patient presents with intermittent episodes of abnormal sensation to her chest that radiates bilaterally down her arms to her hands and only last a few seconds at a time. Patient reports pain feels similar to when she was diagnosed with a PE in March. Denies current use of anticoagulants. Patient currently denies any pain or complaints.VSS. Exam unremarkable. EKG showed sinus rhythm with LBBB, no significant changes from prior. Trop negative. CXR negative. Discussed pt with Dr. Lacinda Axon who evaluated pt in the ED. Plan to order CT angio chest for evaluation of PE due to pt with hx of PE. CT showed evidence chronic thromboembolic disease, no acute PE seen. On reevaluation patient is resting in bed comfortably without any pain or complaints. I have a low suspicion for ACS, dissection, or other acute cardiac event at this time.  Discussed results and plan for discharge with patient. Plan to discharge patient home with symptomatic treatment and outpatient PCP follow-up. Discussed return precautions.  Final Clinical Impressions(s) / ED Diagnoses   Final diagnoses:  Nonspecific chest pain    New Prescriptions New Prescriptions   No  medications on file     Nona Dell, PA-C 11/17/16 [place], MD 11/18/16 [phone]

## 2016-11-17 NOTE — ED Triage Notes (Signed)
Pt complaining of chest pain that radiates to bilateral hands x this evening. Pt complaining of some lightheadedness. Pt states has hx of blood clot. Pt denies any SOB.

## 2016-12-01 NOTE — Progress Notes (Signed)
Subjective:    Patient ID: Danielle Pratt, female    DOB: Feb 18, 1951, 66 y.o.   MRN: [id number]  HPI The patient is here for follow up      Hypothyroidism:  She is taking her medication daily.  She denies any recent changes in energy or weight that are unexplained.   She has body temperature changes - feels warm at times.  She also has body odor under her arms, which is new.   PND:  She has it constantly.  She denies allergies.  She feels it when she swallows.  She denies nasal drainage.  She does have a history of radiation.   Her father, brother and sister all have celiac disease.  She has recently had some fecal leakage, but denies all other GI symptoms.  She denies rashes.  She denies headaches regularly.  Anxiety: She is taking her medication daily as prescribed. She denies any side effects from the medication. She feels her anxiety is well controlled and she is happy with her current dose of medication.    Medications and allergies reviewed with patient and updated if appropriate.  Patient Active Problem List   Diagnosis Date Noted  . Hip dislocation, right ([place]) 02/20/2016  . DVT, RLE 01/31/16 02/20/2016  . Anxiety 02/20/2016  . Pulmonary emboli ([place]) 01/31/2016  . Multiple pigmented nevi 07/28/2015  . Unruptured popliteal cyst 09/07/2013  . HYPERLIPIDEMIA 01/01/2010  . LEFT BUNDLE BRANCH BLOCK 01/01/2010  . OSTEOARTHRITIS 01/01/2010  . Malignant neoplasm of tonsillar fossa ([place]) 12/27/2008  . Hypothyroidism 07/17/2007    Current Outpatient Prescriptions on File Prior to Visit  Medication Sig Dispense Refill  . aspirin EC 81 MG tablet Take 1 tablet (81 mg total) by mouth daily. (Patient taking differently: Take 81 mg by mouth at bedtime. )    . Cholecalciferol (VITAMIN D) 2000 units tablet Take 2,000 Units by mouth at bedtime.    . clobetasol cream (TEMOVATE) AB-123456789 % Apply 1 application topically at bedtime as needed (skin irritation/ finger).    [name] levothyroxine  (SYNTHROID, LEVOTHROID) 50 MCG tablet Take 1-1.5 tablets (50-75 mcg total) by mouth daily before breakfast. TAKE 1.5 Tabs 32mcg po, ON TUESDAYS AND SATURDAYS TAKE ONE TAB= 42mcg (Patient taking differently: Take 50-75 mcg by mouth See admin instructions. Take 1 tablet (50 mcg) by mouth on Tuesday and Saturday before breakfast, take 1 1/2 tablet (75 mcg) on Sunday, Monday, Wednesday, Thursday, Friday before breakfast.) 90 tablet 1  . pilocarpine (SALAGEN) 5 MG tablet Take 5 mg by mouth See admin instructions. Take 1 tablet (5 mg) by mouth 3 times daily - early morning, midday and bedtime, may take an additional tablet during the day as needed for dry mouth.    . venlafaxine XR (EFFEXOR-XR) 75 MG 24 hr capsule Take 1 capsule (75 mg total) by mouth at bedtime. 90 capsule 3   No current facility-administered medications on file prior to visit.     Past Medical History:  Diagnosis Date  . Anxiety   . Cancer ([place])    [place] ca 2000  . Cataract    extraction surgery  . Fasting hyperglycemia    110 in 01/2011;131 in 05/2007  . Hyperlipidemia   . Hypothyroidism   . Popliteal cyst     Past Surgical History:  Procedure Laterality Date  . CESAREAN SECTION     G 1 P 2  . COLONOSCOPY  2003   negative; Dr Henrene Pastor  . LYMPHADENECTOMY Right  neck  . TONSILLECTOMY AND ADENOIDECTOMY  1998   R tonsilar malignancy; subsequent LN dissection negative  . TOTAL ABDOMINAL HYSTERECTOMY W/ BILATERAL SALPINGOOPHORECTOMY     fibroids; Dr Nori Riis  . TOTAL HIP ARTHROPLASTY  2007   Dr Maureen Ralphs, both hips 3 months apart    Social History   Social History  . Marital status: Married    Spouse name: N/A  . Number of children: N/A  . Years of education: N/A   Social History Main Topics  . Smoking status: Never Smoker  . Smokeless tobacco: Never Used  . Alcohol use 0.0 oz/week     Comment:  3/week  . Drug use: No  . Sexual activity: Not on file   Other Topics Concern  . Not on file   Social History  Narrative  . No narrative on file    Family History  Problem Relation Age of Onset  . Hyperlipidemia Mother   . Hypertension Mother   . Ovarian cancer Mother   . Aneurysm Mother     ascending & descending aorta  . Heart attack Father 59  . Leukemia Paternal Grandmother   . Lung cancer Paternal Grandfather   . Diabetes Neg Hx   . Stroke Neg Hx   . Colon cancer Neg Hx   . Stomach cancer Neg Hx   . Rectal cancer Neg Hx     Review of Systems  Constitutional: Negative for fever.  Respiratory: Negative for cough and shortness of breath.   Cardiovascular: Negative for chest pain (occ spasms in chest - jerking sensation in chest), palpitations and leg swelling.  Gastrointestinal: Negative for abdominal pain (abdominal cramping), constipation and diarrhea.  Skin: Negative for rash.  Neurological: Negative for light-headedness and headaches.  Psychiatric/Behavioral: Negative for dysphoric mood. The patient is nervous/anxious (controlled).        Objective:   Vitals:   12/02/16 1447  BP: 132/84  Pulse: 92  Resp: 16  Temp: 97.8 F (36.6 C)   Wt Readings from Last 3 Encounters:  12/02/16 155 lb (70.3 kg)  09/05/16 155 lb (70.3 kg)  02/26/16 149 lb (67.6 kg)   Body mass index is 22.89 kg/m.   Physical Exam    Constitutional: Appears well-developed and well-nourished. No distress.  HENT:  Head: Normocephalic and atraumatic.  Neck: Neck supple. No tracheal deviation present. No thyromegaly present.  No cervical lymphadenopathy Cardiovascular: Normal rate, regular rhythm and normal heart sounds.   No murmur heard. No carotid bruit .  No edema Pulmonary/Chest: Effort normal and breath sounds normal. No respiratory distress. No has no wheezes. No rales.  Skin: Skin is warm and dry. Not diaphoretic.  Psychiatric: Normal mood and affect. Behavior is normal.      Assessment & Plan:    See Problem List for Assessment and Plan of chronic medical problems.   FU  annually

## 2016-12-02 ENCOUNTER — Encounter: Payer: Self-pay | Admitting: Internal Medicine

## 2016-12-02 ENCOUNTER — Ambulatory Visit (INDEPENDENT_AMBULATORY_CARE_PROVIDER_SITE_OTHER): Payer: BLUE CROSS/BLUE SHIELD | Admitting: Internal Medicine

## 2016-12-02 ENCOUNTER — Other Ambulatory Visit (INDEPENDENT_AMBULATORY_CARE_PROVIDER_SITE_OTHER): Payer: BLUE CROSS/BLUE SHIELD

## 2016-12-02 VITALS — BP 132/84 | HR 92 | Temp 97.8°F | Resp 16 | Ht 69.0 in | Wt 155.0 lb

## 2016-12-02 DIAGNOSIS — R0982 Postnasal drip: Secondary | ICD-10-CM | POA: Diagnosis not present

## 2016-12-02 DIAGNOSIS — F419 Anxiety disorder, unspecified: Secondary | ICD-10-CM

## 2016-12-02 DIAGNOSIS — E038 Other specified hypothyroidism: Secondary | ICD-10-CM

## 2016-12-02 LAB — TSH: TSH: 1.83 u[IU]/mL (ref 0.35–4.50)

## 2016-12-02 NOTE — Assessment & Plan Note (Signed)
Chronic - thick mucus - feels like it gets stuck in her throat Likely related to prior radiation for cancer treatment Drinks lots of water Taking pilocarpine

## 2016-12-02 NOTE — Progress Notes (Signed)
Pre visit review using our clinic review tool, if applicable. No additional management support is needed unless otherwise documented below in the visit note. 

## 2016-12-02 NOTE — Assessment & Plan Note (Signed)
Controlled, stable Continue current dose of medication  

## 2016-12-02 NOTE — Assessment & Plan Note (Signed)
Check tsh  Titrate med dose if needed  

## 2016-12-02 NOTE — Patient Instructions (Addendum)
  Test(s) ordered today. Your results will be released to [facility] (or called to you) after review, usually within 72hours after test completion. If any changes need to be made, you will be notified at that same time.    Medications reviewed and updated.  No changes recommended at this time.    Please followup in one year for a physical

## 2016-12-03 ENCOUNTER — Encounter: Payer: Self-pay | Admitting: Internal Medicine

## 2016-12-18 ENCOUNTER — Other Ambulatory Visit: Payer: Self-pay | Admitting: Internal Medicine

## 2017-04-28 ENCOUNTER — Encounter: Payer: Self-pay | Admitting: Family Medicine

## 2017-04-28 ENCOUNTER — Ambulatory Visit (INDEPENDENT_AMBULATORY_CARE_PROVIDER_SITE_OTHER): Payer: BLUE CROSS/BLUE SHIELD | Admitting: Family Medicine

## 2017-04-28 ENCOUNTER — Ambulatory Visit (INDEPENDENT_AMBULATORY_CARE_PROVIDER_SITE_OTHER)
Admission: RE | Admit: 2017-04-28 | Discharge: 2017-04-28 | Disposition: A | Discharge status: Home / Self Care | Payer: BLUE CROSS/BLUE SHIELD | Source: Ambulatory Visit | Attending: Family Medicine | Admitting: Family Medicine

## 2017-04-28 VITALS — BP 126/80 | HR 90 | Temp 97.4°F | Wt 153.2 lb

## 2017-04-28 DIAGNOSIS — M25474 Effusion, right foot: Secondary | ICD-10-CM

## 2017-04-28 NOTE — Patient Instructions (Signed)
Please go to Xray  I will notify you of results

## 2017-04-28 NOTE — Progress Notes (Signed)
   Subjective:    Patient ID: Danielle Pratt, female    DOB: 05-Nov-1951, 66 y.o.   MRN: [id number]  HPI This is a 66 yo female who presents today with foot swelling x 1 month. No known trauma. Goes barefoot frequently. More tender recently. No focal pain or drainage. No systemic illness. Pain 1/10. Does not interfere with activities.   Past Medical History:  Diagnosis Date  . Anxiety   . Cancer ([place])    [place] ca 2000  . Cataract    extraction surgery  . Fasting hyperglycemia    110 in 01/2011;131 in 05/2007  . Hyperlipidemia   . Hypothyroidism   . Popliteal cyst    Past Surgical History:  Procedure Laterality Date  . CESAREAN SECTION     G 1 P 2  . COLONOSCOPY  2003   negative; Dr Henrene Pastor  . LYMPHADENECTOMY Right    neck  . TONSILLECTOMY AND ADENOIDECTOMY  1998   R tonsilar malignancy; subsequent LN dissection negative  . TOTAL ABDOMINAL HYSTERECTOMY W/ BILATERAL SALPINGOOPHORECTOMY     fibroids; Dr Nori Riis  . TOTAL HIP ARTHROPLASTY  2007   Dr Maureen Ralphs, both hips 3 months apart   Family History  Problem Relation Age of Onset  . Hyperlipidemia Mother   . Hypertension Mother   . Ovarian cancer Mother   . Aneurysm Mother        ascending & descending aorta  . Heart attack Father 75  . Leukemia Paternal Grandmother   . Lung cancer Paternal Grandfather   . Diabetes Neg Hx   . Stroke Neg Hx   . Colon cancer Neg Hx   . Stomach cancer Neg Hx   . Rectal cancer Neg Hx    Social History  Substance Use Topics  . Smoking status: Never Smoker  . Smokeless tobacco: Never Used  . Alcohol use 0.0 oz/week     Comment:  3/week      Review of Systems Per HPI    Objective:   Physical Exam  Constitutional: She appears well-developed and well-nourished. No distress.  HENT:  Head: Normocephalic and atraumatic.  Cardiovascular: Normal rate.   Pulmonary/Chest: Effort normal.  Musculoskeletal: Normal range of motion. She exhibits edema.       Feet:  Skin: She is not diaphoretic.   Vitals reviewed.     BP 126/80 (BP Location: Left Arm, Patient Position: Sitting, Cuff Size: Normal)   Pulse 90   Temp 97.4 F (36.3 C) (Oral)   Wt 153 lb 4 oz (69.5 kg)   SpO2 94%   BMI 22.63 kg/m      Assessment & Plan:  1. Swelling of foot joint, right - not sure if she had a foreign body, will check xray and determine follow up based on results - DG Foot 2 Views Right; Future   Clarene Reamer, FNP-BC  [facility] [place], [place]  04/28/2017 10:27 AM

## 2017-05-09 ENCOUNTER — Other Ambulatory Visit: Payer: Self-pay | Admitting: Internal Medicine

## 2017-05-22 ENCOUNTER — Other Ambulatory Visit: Payer: Self-pay | Admitting: Obstetrics & Gynecology

## 2017-05-22 DIAGNOSIS — Z1231 Encounter for screening mammogram for malignant neoplasm of breast: Secondary | ICD-10-CM

## 2017-06-10 ENCOUNTER — Ambulatory Visit
Admission: RE | Admit: 2017-06-10 | Discharge: 2017-06-10 | Disposition: A | Discharge status: Home / Self Care | Payer: BLUE CROSS/BLUE SHIELD | Source: Ambulatory Visit | Attending: Obstetrics & Gynecology | Admitting: Obstetrics & Gynecology

## 2017-06-10 DIAGNOSIS — Z1231 Encounter for screening mammogram for malignant neoplasm of breast: Secondary | ICD-10-CM

## 2017-06-18 ENCOUNTER — Ambulatory Visit (INDEPENDENT_AMBULATORY_CARE_PROVIDER_SITE_OTHER): Payer: PPO | Admitting: Family Medicine

## 2017-06-18 DIAGNOSIS — S92819A Other fracture of unspecified foot, initial encounter for closed fracture: Secondary | ICD-10-CM | POA: Insufficient documentation

## 2017-06-18 DIAGNOSIS — S92811A Other fracture of right foot, initial encounter for closed fracture: Secondary | ICD-10-CM | POA: Diagnosis not present

## 2017-06-18 NOTE — Assessment & Plan Note (Signed)
Sesamoid fracture under the ball of patient's right first toe. Patient also has a wound appears to be a calcific bursitis in this area. No signs of infection or any type of for tetanus injection which patient declined. Patient will wear a rigid sole shoe, we discussed padding, we discussed icing regimen and vitamin D supplementation. Follow-up with me again in 4 weeksegn body. Ptient did have a recent injury right next to the foot. Patient states that she's did step on a nail. Did discuss the possibility of

## 2017-06-19 ENCOUNTER — Encounter: Payer: Self-pay | Admitting: Family Medicine

## 2017-06-19 NOTE — Progress Notes (Signed)
[name] [address] [place], [place] [address] Phone: [phone] Subjective:    I'm seeing this patient by the request  of:  Burns, Claudina Lick, MD Carlean Purl, NP  CC: Foot pain   [id number]  Danielle Pratt is a 66 y.o. female coming in with complaint of foot pain. Right-sided. Seems to be on the plantar aspect of the foot. Been going on one month. Patient does not remember any true injury. Patient usually does go barefoot and thinks that could be something within the skin. No redness, no discharge. Does feel like there is a fullness. States that this pain is minimal. Does not stop her from activity but does seem to be irritating. -     Past Medical History:  Diagnosis Date  . Anxiety   . Cancer ([place])    [place] ca 2000  . Cataract    extraction surgery  . Fasting hyperglycemia    110 in 01/2011;131 in 05/2007  . Hyperlipidemia   . Hypothyroidism   . Popliteal cyst    Past Surgical History:  Procedure Laterality Date  . CESAREAN SECTION     G 1 P 2  . COLONOSCOPY  2003   negative; Dr Henrene Pastor  . LYMPHADENECTOMY Right    neck  . TONSILLECTOMY AND ADENOIDECTOMY  1998   R tonsilar malignancy; subsequent LN dissection negative  . TOTAL ABDOMINAL HYSTERECTOMY W/ BILATERAL SALPINGOOPHORECTOMY     fibroids; Dr Nori Riis  . TOTAL HIP ARTHROPLASTY  2007   Dr Maureen Ralphs, both hips 3 months apart   Social History   Social History  . Marital status: Married    Spouse name: N/A  . Number of children: N/A  . Years of education: N/A   Social History Main Topics  . Smoking status: Never Smoker  . Smokeless tobacco: Never Used  . Alcohol use 0.0 oz/week     Comment:  3/week  . Drug use: No  . Sexual activity: Not Asked   Other Topics Concern  . None   Social History Narrative  . None   No Known Allergies Family History  Problem Relation Age of Onset  . Hyperlipidemia Mother   . Hypertension Mother   . Ovarian cancer Mother   . Aneurysm Mother         ascending & descending aorta  . Heart attack Father 75  . Leukemia Paternal Grandmother   . Lung cancer Paternal Grandfather   . Diabetes Neg Hx   . Stroke Neg Hx   . Colon cancer Neg Hx   . Stomach cancer Neg Hx   . Rectal cancer Neg Hx      Past medical history, social, surgical and family history all reviewed in electronic medical record.  No pertanent information unless stated regarding to the chief complaint.   Review of Systems:Review of systems updated and as accurate as of 06/19/17  No headache, visual changes, nausea, vomiting, diarrhea, constipation, dizziness, abdominal pain, skin rash, fevers, chills, night sweats, weight loss, swollen lymph nodes, body aches, joint swelling, muscle aches, chest pain, shortness of breath, mood changes.   Objective  Blood pressure 116/78, pulse 100, height 5\' 9"  (1.753 m), weight 155 lb (70.3 kg), SpO2 95 %. Systems examined below as of 06/19/17   General: No apparent distress alert and oriented x3 mood and affect normal, dressed appropriately.  HEENT: Pupils equal, extraocular movements intact  Respiratory: Patient's speak in full sentences and does not appear short of breath  Cardiovascular:  No lower extremity edema, non tender, no erythema  Skin: Warm dry intact with no signs of infection or rash on extremities or on axial skeleton.  Abdomen: Soft nontender  Neuro: Cranial nerves II through XII are intact, neurovascularly intact in all extremities with 2+ DTRs and 2+ pulses.  Lymph: No lymphadenopathy of posterior or anterior cervical chain or axillae bilaterally.  Gait normal with good balance and coordination.  MSK:  Non tender with full range of motion and good stability and symmetric strength and tone of shoulders, elbows, wrist, hip, knee and ankles bilaterally.  Foot exam shows the patient does have a narrow foot. Fairly neutral overall. Full range of motion of the ankle. Plantar aspect of foot shows the patient does have a  banding in place. Small puncture wound noted that seems to be clean. Patient is minimally tender to palpation over the first metatarsal with very mild swelling. Full range of motion of the first MTP. Neurovascularly intact.  Limited musculoskeletal ultrasound was performed and interpreted by Lyndal Pulley  Limited musculoskeletal ultrasound shows the patient does have some soft tissue swelling in the area patient's discomfort. Mild increase in Doppler flow. Patient's sesamoid bone does appear to have a potential cortical defect with increasing Doppler flow. Impression: Sesamoid fracture   Impression and Recommendations:     This case required medical decision making of moderate complexity.      Note: This dictation was prepared with Dragon dictation along with smaller phrase technology. Any transcriptional errors that result from this process are unintentional.

## 2017-07-04 ENCOUNTER — Telehealth: Payer: Self-pay | Admitting: Family Medicine

## 2017-07-04 NOTE — Telephone Encounter (Signed)
Pt called stating Vitamin D was supposed to be called in to her pharmacy  From her visit on 8/8 and this was not done  Please send to [facility] on file

## 2017-07-07 MED ORDER — VITAMIN D (ERGOCALCIFEROL) 1.25 MG (50000 UNIT) PO CAPS: 50000.0000 [IU] | ORAL_CAPSULE | ORAL | 0 refills | Status: DC

## 2017-07-07 NOTE — Telephone Encounter (Signed)
Sent rx in to pharmacy 

## 2017-07-09 ENCOUNTER — Encounter: Payer: Self-pay | Admitting: Family Medicine

## 2017-07-09 ENCOUNTER — Other Ambulatory Visit (INDEPENDENT_AMBULATORY_CARE_PROVIDER_SITE_OTHER): Payer: PPO

## 2017-07-09 ENCOUNTER — Other Ambulatory Visit: Payer: Self-pay | Admitting: *Deleted

## 2017-07-09 ENCOUNTER — Ambulatory Visit: Payer: Self-pay

## 2017-07-09 ENCOUNTER — Ambulatory Visit (INDEPENDENT_AMBULATORY_CARE_PROVIDER_SITE_OTHER): Payer: PPO | Admitting: Family Medicine

## 2017-07-09 VITALS — BP 110/80 | HR 80 | Ht 69.0 in | Wt 151.0 lb

## 2017-07-09 DIAGNOSIS — R319 Hematuria, unspecified: Secondary | ICD-10-CM

## 2017-07-09 DIAGNOSIS — M79671 Pain in right foot: Secondary | ICD-10-CM

## 2017-07-09 DIAGNOSIS — S92811G Other fracture of right foot, subsequent encounter for fracture with delayed healing: Secondary | ICD-10-CM | POA: Diagnosis not present

## 2017-07-09 LAB — URINALYSIS
Bilirubin Urine: NEGATIVE
Hgb urine dipstick: NEGATIVE
KETONES UR: NEGATIVE
Leukocytes, UA: NEGATIVE
Nitrite: NEGATIVE
SPECIFIC GRAVITY, URINE: 1.01 (ref 1.000–1.030)
Total Protein, Urine: NEGATIVE
UROBILINOGEN UA: 0.2 (ref 0.0–1.0)
Urine Glucose: NEGATIVE
pH: 7 (ref 5.0–8.0)

## 2017-07-09 NOTE — Progress Notes (Signed)
[facility] [address] [place], [place] [address] Phone: [phone] Subjective:    I'm seeing this patient by the request  of:    CC: Foot pain  [id number]  Danielle Pratt is a 66 y.o. female coming in with complaint of foot pain. Patient was found to have assessment fracture. Patient was to do home exercises, icing regimen, and once weekly vitamin D. Patient has been completely noncompliant and [place]. Patient has been done a lot of walking. Patient states very minimal improvement and if anything.       Past Medical History:  Diagnosis Date  . Anxiety   . Cancer ([place])    [place] ca 2000  . Cataract    extraction surgery  . Fasting hyperglycemia    110 in 01/2011;131 in 05/2007  . Hyperlipidemia   . Hypothyroidism   . Popliteal cyst    Past Surgical History:  Procedure Laterality Date  . CESAREAN SECTION     G 1 P 2  . COLONOSCOPY  2003   negative; Dr Henrene Pastor  . LYMPHADENECTOMY Right    neck  . TONSILLECTOMY AND ADENOIDECTOMY  1998   R tonsilar malignancy; subsequent LN dissection negative  . TOTAL ABDOMINAL HYSTERECTOMY W/ BILATERAL SALPINGOOPHORECTOMY     fibroids; Dr Nori Riis  . TOTAL HIP ARTHROPLASTY  2007   Dr Maureen Ralphs, both hips 3 months apart   Social History   Social History  . Marital status: Married    Spouse name: N/A  . Number of children: N/A  . Years of education: N/A   Social History Main Topics  . Smoking status: Never Smoker  . Smokeless tobacco: Never Used  . Alcohol use 0.0 oz/week     Comment:  3/week  . Drug use: No  . Sexual activity: Not Asked   Other Topics Concern  . None   Social History Narrative  . None   No Known Allergies Family History  Problem Relation Age of Onset  . Hyperlipidemia Mother   . Hypertension Mother   . Ovarian cancer Mother   . Aneurysm Mother        ascending & descending aorta  . Heart attack Father 65  . Leukemia Paternal Grandmother   . Lung  cancer Paternal Grandfather   . Diabetes Neg Hx   . Stroke Neg Hx   . Colon cancer Neg Hx   . Stomach cancer Neg Hx   . Rectal cancer Neg Hx      Past medical history, social, surgical and family history all reviewed in electronic medical record.  No pertanent information unless stated regarding to the chief complaint.   Review of Systems:Review of systems updated and as accurate as of 07/09/17  No headache, visual changes, nausea, vomiting, diarrhea, constipation, dizziness, abdominal pain, skin rash, fevers, chills, night sweats, weight loss, swollen lymph nodes, body aches, joint swelling, muscle aches, chest pain, shortness of breath, mood changes.   Objective  Blood pressure 110/80, pulse 80, height 5\' 9"  (1.753 m), weight 151 lb (68.5 kg). Systems examined below as of 07/09/17   General: No apparent distress alert and oriented x3 mood and affect normal, dressed appropriately.  HEENT: Pupils equal, extraocular movements intact  Respiratory: Patient's speak in full sentences and does not appear short of breath  Cardiovascular: No lower extremity edema, non tender, no erythema  Skin: Warm dry intact with no signs of infection or rash on extremities or on axial skeleton.  Abdomen: Soft nontender  Neuro: Cranial nerves II through XII are intact, neurovascularly intact in all extremities with 2+ DTRs and 2+ pulses.  Lymph: No lymphadenopathy of posterior or anterior cervical chain or axillae bilaterally.  Gait normal with good balance and coordination.  MSK:  Non tender with full range of motion and good stability and symmetric strength and tone of shoulders, elbows, wrist, hip, knee and ankles bilaterally.  Foot exam shows the patient does still have the significant neutral foot that is now. Mild pain still noted on the plantar aspect of the first toe. No swelling noted which is improved. Full range of motion noted.  Limited musculoskeletal ultrasound was performed and interpreted by  Lyndal Pulley  Limited ultrasound shows the patient does have some decrease in hypoechoic changes that were seen previously. Patient still has cortical defect noted. Callus formation started but not complete. Impression: Interval healing of sesamoid fracture   Impression and Recommendations:     This case required medical decision making of moderate complexity.      Note: This dictation was prepared with Dragon dictation along with smaller phrase technology. Any transcriptional errors that result from this process are unintentional.

## 2017-07-09 NOTE — Assessment & Plan Note (Signed)
Patient is making mild improvement. Not making any significant changes with her being noncompliant. Encourage patient to take the once weekly vitamin D which patient wanted. Encourage patient to wear the proper shoes. Follow-up again in 4-6 weeks

## 2017-07-09 NOTE — Patient Instructions (Signed)
Good to see you  We are making a little improved.  The once weekly vitamin D will help  Like the shoes.  Ice when it gets bad See me again in 6 weeks.

## 2017-07-10 LAB — URINE CULTURE: ORGANISM ID, BACTERIA: NO GROWTH

## 2017-08-21 ENCOUNTER — Encounter: Payer: Self-pay | Admitting: Family Medicine

## 2017-08-21 ENCOUNTER — Ambulatory Visit (INDEPENDENT_AMBULATORY_CARE_PROVIDER_SITE_OTHER): Payer: PPO | Admitting: Family Medicine

## 2017-08-21 ENCOUNTER — Ambulatory Visit: Payer: Self-pay

## 2017-08-21 VITALS — BP 140/90 | HR 85 | Ht 69.0 in | Wt 153.0 lb

## 2017-08-21 DIAGNOSIS — S92811G Other fracture of right foot, subsequent encounter for fracture with delayed healing: Secondary | ICD-10-CM | POA: Diagnosis not present

## 2017-08-21 DIAGNOSIS — M79671 Pain in right foot: Secondary | ICD-10-CM

## 2017-08-21 NOTE — Assessment & Plan Note (Signed)
Healed at this time. No change in any type of treatment. Patient will finish with the once weekly vitamin D over the course the next 4 weeks. Patient and will follow-up with me as needed. We did discuss other shoes that we think could be beneficial.

## 2017-08-21 NOTE — Patient Instructions (Signed)
Good to see you Finish the vitamin D  [place] shoes could be great  See me when you need me

## 2017-08-21 NOTE — Progress Notes (Signed)
[name] [address] [place], [place] [address] Phone: [phone] Subjective:    I'm seeing this patient by the request  of:    CC: Foot pain follow-up  [id number]  Danielle Pratt is a 66 y.o. female coming in with complaint of Foot pain. Patient was found to have a sesamoid fracture. Feeling under percent better at this time. Did have some discomfort when walking a significant amount of distance. No swelling, no inflammation. Continues once weekly vitamin D with no side effects.       Past Medical History:  Diagnosis Date  . Anxiety   . Cancer ([place])    [place] ca 2000  . Cataract    extraction surgery  . Fasting hyperglycemia    110 in 01/2011;131 in 05/2007  . Hyperlipidemia   . Hypothyroidism   . Popliteal cyst    Past Surgical History:  Procedure Laterality Date  . CESAREAN SECTION     G 1 P 2  . COLONOSCOPY  2003   negative; Dr Henrene Pastor  . LYMPHADENECTOMY Right    neck  . TONSILLECTOMY AND ADENOIDECTOMY  1998   R tonsilar malignancy; subsequent LN dissection negative  . TOTAL ABDOMINAL HYSTERECTOMY W/ BILATERAL SALPINGOOPHORECTOMY     fibroids; Dr Nori Riis  . TOTAL HIP ARTHROPLASTY  2007   Dr Maureen Ralphs, both hips 3 months apart   Social History   Social History  . Marital status: Married    Spouse name: N/A  . Number of children: N/A  . Years of education: N/A   Social History Main Topics  . Smoking status: Never Smoker  . Smokeless tobacco: Never Used  . Alcohol use 0.0 oz/week     Comment:  3/week  . Drug use: No  . Sexual activity: Not on file   Other Topics Concern  . Not on file   Social History Narrative  . No narrative on file   No Known Allergies Family History  Problem Relation Age of Onset  . Hyperlipidemia Mother   . Hypertension Mother   . Ovarian cancer Mother   . Aneurysm Mother        ascending & descending aorta  . Heart attack Father 11  . Leukemia Paternal Grandmother   . Lung cancer  Paternal Grandfather   . Diabetes Neg Hx   . Stroke Neg Hx   . Colon cancer Neg Hx   . Stomach cancer Neg Hx   . Rectal cancer Neg Hx      Past medical history, social, surgical and family history all reviewed in electronic medical record.  No pertanent information unless stated regarding to the chief complaint.   Review of Systems:Review of systems updated and as accurate as of 08/21/17  No headache, visual changes, nausea, vomiting, diarrhea, constipation, dizziness, abdominal pain, skin rash, fevers, chills, night sweats, weight loss, swollen lymph nodes, body aches, joint swelling, muscle aches, chest pain, shortness of breath, mood changes.   Objective  Blood pressure (!) 123/45. Systems examined below as of 08/21/17   General: No apparent distress alert and oriented x3 mood and affect normal, dressed appropriately.  HEENT: Pupils equal, extraocular movements intact  Respiratory: Patient's speak in full sentences and does not appear short of breath  Cardiovascular: No lower extremity edema, non tender, no erythema  Skin: Warm dry intact with no signs of infection or rash on extremities or on axial skeleton.  Abdomen: Soft nontender  Neuro: Cranial nerves II through XII are intact,  neurovascularly intact in all extremities with 2+ DTRs and 2+ pulses.  Lymph: No lymphadenopathy of posterior or anterior cervical chain or axillae bilaterally.  Gait normal with good balance and coordination.  MSK:  Non tender with full range of motion and good stability and symmetric strength and tone of shoulders, elbows, wrist, hip, knee and ankles bilaterally.  Foot exam shows the patient does have some mild breakdown of transverse arch. Callus formation on the bottom of the first metatarsal. Nontender on exam. Full range of motion of the ankle.  Limited musculoskeletal ultrasound was performed and interpreted by Lyndal Pulley  Limited ultrasound of patient's sesamoid bone shows the patient does  have good callus formation at this time and seems to be hard callus. Does bridge the entire fracture. No hypoechoic changes noted. No surrounding tendinitis and was seen previously.  Impression: Healed sesamoid fracture   Impression and Recommendations:     This case required medical decision making of moderate complexity.      Note: This dictation was prepared with Dragon dictation along with smaller phrase technology. Any transcriptional errors that result from this process are unintentional.

## 2017-09-10 DIAGNOSIS — Z961 Presence of intraocular lens: Secondary | ICD-10-CM | POA: Diagnosis not present

## 2017-09-27 ENCOUNTER — Other Ambulatory Visit: Payer: Self-pay | Admitting: Internal Medicine

## 2017-09-27 ENCOUNTER — Other Ambulatory Visit: Payer: Self-pay | Admitting: Family Medicine

## 2017-09-30 ENCOUNTER — Other Ambulatory Visit: Payer: Self-pay | Admitting: Internal Medicine

## 2017-10-08 ENCOUNTER — Other Ambulatory Visit: Payer: Self-pay | Admitting: Internal Medicine

## 2017-10-12 DIAGNOSIS — L03012 Cellulitis of left finger: Secondary | ICD-10-CM | POA: Diagnosis not present

## 2017-11-11 ENCOUNTER — Emergency Department (HOSPITAL_COMMUNITY): Payer: PPO

## 2017-11-11 ENCOUNTER — Other Ambulatory Visit: Payer: Self-pay

## 2017-11-11 ENCOUNTER — Encounter (HOSPITAL_COMMUNITY): Payer: Self-pay

## 2017-11-11 ENCOUNTER — Inpatient Hospital Stay (HOSPITAL_COMMUNITY)
Admission: EM | Admit: 2017-11-11 | Discharge: 2017-11-15 | DRG: 175 | Disposition: A | Discharge status: Home / Self Care | Payer: PPO | Attending: Internal Medicine | Admitting: Internal Medicine

## 2017-11-11 DIAGNOSIS — Z79890 Hormone replacement therapy (postmenopausal): Secondary | ICD-10-CM

## 2017-11-11 DIAGNOSIS — N182 Chronic kidney disease, stage 2 (mild): Secondary | ICD-10-CM | POA: Diagnosis not present

## 2017-11-11 DIAGNOSIS — Z96643 Presence of artificial hip joint, bilateral: Secondary | ICD-10-CM | POA: Diagnosis present

## 2017-11-11 DIAGNOSIS — Z801 Family history of malignant neoplasm of trachea, bronchus and lung: Secondary | ICD-10-CM | POA: Diagnosis not present

## 2017-11-11 DIAGNOSIS — Z85818 Personal history of malignant neoplasm of other sites of lip, oral cavity, and pharynx: Secondary | ICD-10-CM | POA: Diagnosis not present

## 2017-11-11 DIAGNOSIS — I2609 Other pulmonary embolism with acute cor pulmonale: Secondary | ICD-10-CM

## 2017-11-11 DIAGNOSIS — J9601 Acute respiratory failure with hypoxia: Secondary | ICD-10-CM

## 2017-11-11 DIAGNOSIS — Z7982 Long term (current) use of aspirin: Secondary | ICD-10-CM

## 2017-11-11 DIAGNOSIS — Z90710 Acquired absence of both cervix and uterus: Secondary | ICD-10-CM | POA: Diagnosis not present

## 2017-11-11 DIAGNOSIS — R0602 Shortness of breath: Secondary | ICD-10-CM

## 2017-11-11 DIAGNOSIS — C090 Malignant neoplasm of tonsillar fossa: Secondary | ICD-10-CM | POA: Diagnosis present

## 2017-11-11 DIAGNOSIS — I071 Rheumatic tricuspid insufficiency: Secondary | ICD-10-CM | POA: Diagnosis present

## 2017-11-11 DIAGNOSIS — I2699 Other pulmonary embolism without acute cor pulmonale: Principal | ICD-10-CM | POA: Diagnosis present

## 2017-11-11 DIAGNOSIS — M1990 Unspecified osteoarthritis, unspecified site: Secondary | ICD-10-CM | POA: Diagnosis present

## 2017-11-11 DIAGNOSIS — F419 Anxiety disorder, unspecified: Secondary | ICD-10-CM | POA: Diagnosis present

## 2017-11-11 DIAGNOSIS — Z8041 Family history of malignant neoplasm of ovary: Secondary | ICD-10-CM

## 2017-11-11 DIAGNOSIS — Z9849 Cataract extraction status, unspecified eye: Secondary | ICD-10-CM | POA: Diagnosis not present

## 2017-11-11 DIAGNOSIS — I700 Atherosclerosis of aorta: Secondary | ICD-10-CM | POA: Diagnosis present

## 2017-11-11 DIAGNOSIS — Z806 Family history of leukemia: Secondary | ICD-10-CM

## 2017-11-11 DIAGNOSIS — E039 Hypothyroidism, unspecified: Secondary | ICD-10-CM | POA: Diagnosis not present

## 2017-11-11 DIAGNOSIS — Z86718 Personal history of other venous thrombosis and embolism: Secondary | ICD-10-CM | POA: Diagnosis not present

## 2017-11-11 DIAGNOSIS — Z86711 Personal history of pulmonary embolism: Secondary | ICD-10-CM | POA: Diagnosis not present

## 2017-11-11 DIAGNOSIS — J449 Chronic obstructive pulmonary disease, unspecified: Secondary | ICD-10-CM | POA: Diagnosis not present

## 2017-11-11 DIAGNOSIS — Z79899 Other long term (current) drug therapy: Secondary | ICD-10-CM

## 2017-11-11 DIAGNOSIS — R0600 Dyspnea, unspecified: Secondary | ICD-10-CM | POA: Diagnosis not present

## 2017-11-11 HISTORY — DX: Unspecified osteoarthritis, unspecified site: M19.90

## 2017-11-11 HISTORY — DX: Disorder of kidney and ureter, unspecified: N28.9

## 2017-11-11 HISTORY — DX: Other pulmonary embolism without acute cor pulmonale: I26.99

## 2017-11-11 HISTORY — DX: Malignant neoplasm of tonsil, unspecified: C09.9

## 2017-11-11 HISTORY — DX: Acute embolism and thrombosis of unspecified deep veins of unspecified lower extremity: I82.409

## 2017-11-11 LAB — CBC
HCT: 44.9 % (ref 36.0–46.0)
HEMOGLOBIN: 14.7 g/dL (ref 12.0–15.0)
MCH: 30.7 pg (ref 26.0–34.0)
MCHC: 32.7 g/dL (ref 30.0–36.0)
MCV: 93.7 fL (ref 78.0–100.0)
PLATELETS: 233 10*3/uL (ref 150–400)
RBC: 4.79 MIL/uL (ref 3.87–5.11)
RDW: 14.3 % (ref 11.5–15.5)
WBC: 10.2 10*3/uL (ref 4.0–10.5)

## 2017-11-11 LAB — BASIC METABOLIC PANEL
ANION GAP: 8 (ref 5–15)
BUN: 19 mg/dL (ref 6–20)
CALCIUM: 9.2 mg/dL (ref 8.9–10.3)
CO2: 27 mmol/L (ref 22–32)
CREATININE: 1.13 mg/dL — AB (ref 0.44–1.00)
Chloride: 100 mmol/L — ABNORMAL LOW (ref 101–111)
GFR, EST AFRICAN AMERICAN: 58 mL/min — AB (ref >60)
GFR, EST NON AFRICAN AMERICAN: 50 mL/min — AB (ref >60)
GLUCOSE: 108 mg/dL — AB (ref 65–99)
Potassium: 4.4 mmol/L (ref 3.5–5.1)
Sodium: 135 mmol/L (ref 135–145)

## 2017-11-11 LAB — BRAIN NATRIURETIC PEPTIDE: B Natriuretic Peptide: 164.3 pg/mL — ABNORMAL HIGH (ref 0.0–100.0)

## 2017-11-11 LAB — D-DIMER, QUANTITATIVE: D-Dimer, Quant: 2.54 ug/mL-FEU — ABNORMAL HIGH (ref 0.00–0.50)

## 2017-11-11 LAB — I-STAT TROPONIN, ED: TROPONIN I, POC: 0.03 ng/mL (ref 0.00–0.08)

## 2017-11-11 MED ORDER — IOPAMIDOL (ISOVUE-370) INJECTION 76%
INTRAVENOUS | Status: AC
  Administered 2017-11-11: 100 mL
  Filled 2017-11-11: qty 100

## 2017-11-11 MED ORDER — HEPARIN BOLUS VIA INFUSION
4000.0000 [IU] | Freq: Once | INTRAVENOUS | Status: AC
  Administered 2017-11-11: 4000 [IU] via INTRAVENOUS
  Filled 2017-11-11: qty 4000

## 2017-11-11 MED ORDER — HEPARIN (PORCINE) IN NACL 100-0.45 UNIT/ML-% IJ SOLN
1100.0000 [IU]/h | INTRAMUSCULAR | Status: DC
  Administered 2017-11-11: 1100 [IU]/h via INTRAVENOUS
  Filled 2017-11-11: qty 250

## 2017-11-11 NOTE — ED Triage Notes (Signed)
Onset right after Christmas shortness of breath on exertion.  Pt has pain underneath right shoulder blade.  NAD at triage.

## 2017-11-11 NOTE — Progress Notes (Addendum)
ANTICOAGULATION CONSULT NOTE - Initial Consult  Pharmacy Consult for heparin Indication: pulmonary embolus  No Known Allergies  Patient Measurements: Height: 5\' 9"  (175.3 cm) Weight: 153 lb (69.4 kg) IBW/kg (Calculated) : 66.2  Vital Signs: Temp: 98.6 F (37 C) (01/01 1624) Temp Source: Oral (01/01 1624) BP: 136/82 (01/01 2145) Pulse Rate: 88 (01/01 2145)  Labs: Recent Labs    11/11/17 1617  HGB 14.7  HCT 44.9  PLT 233  CREATININE 1.13*    Estimated Creatinine Clearance: 51.9 mL/min (A) (by C-G formula based on SCr of 1.13 mg/dL (H)).   Medical History: Past Medical History:  Diagnosis Date  . Anxiety   . Cancer ([place])    [place] ca 2000  . Cataract    extraction surgery  . Fasting hyperglycemia    110 in 01/2011;131 in 05/2007  . Hyperlipidemia   . Hypothyroidism   . Popliteal cyst     Assessment: 67yo female c/o worsening SOB w/ exertion, to begin heparin for PE.  Of note pt had PE in 2017 tx'd w/ Xarelto, stopped after 9mo of treatment.   Goal of Therapy:  Heparin level 0.3-0.7 units/ml Monitor platelets by anticoagulation protocol: Yes   Plan:  Will give heparin 4000 units IV bolus x1 followed by gtt at 1100 units/hr and monitor heparin levels and CBC.  Wynona Neat, PharmD, BCPS  11/11/2017,11:23 PM

## 2017-11-11 NOTE — ED Provider Notes (Signed)
The patient is a very pleasant 67 year old female, she does have a prior history of DVT and pulmonary embolism for which she took Xarelto for 6 months after being on estrogen therapy, the estrogen was discontinued since that time and she has been doing very well however she has had a chronic low level of shortness of breath over the last month or so which has progressively worsened over the last 2 days.  On exam the patient has clear heart and lung sounds, no rales wheezing or rhonchi, speaks in full sentences but is mildly tachypneic, there is no peripheral edema, soft abdomen, no tachycardia.  Labs showed elevated d-dimer and a CT scan shows multilobar pulmonary embolism.  The patient will need to be admitted to the hospital, placed on a heparin drip, further evaluated in the inpatient setting.  Due to her heparin drip and significant process of pulmonary embolism she is critically ill and will need a high level of care in the hospital for the first 24 hours.    CRITICAL CARE Performed by: Johnna Acosta Total critical care time: 35 minutes Critical care time was exclusive of separately billable procedures and treating other patients. Critical care was necessary to treat or prevent imminent or life-threatening deterioration. Critical care was time spent personally by me on the following activities: development of treatment plan with patient and/or surrogate as well as nursing, discussions with consultants, evaluation of patient's response to treatment, examination of patient, obtaining history from patient or surrogate, ordering and performing treatments and interventions, ordering and review of laboratory studies, ordering and review of radiographic studies, pulse oximetry and re-evaluation of patient's condition.   Medical screening examination/treatment/procedure(s) were conducted as a shared visit with non-physician practitioner(s) and myself.  I personally evaluated the patient during the  encounter.  Clinical Impression:   Final diagnoses:  Other acute pulmonary embolism without acute cor pulmonale (HCC)         Noemi Chapel, MD 11/17/17 1041

## 2017-11-11 NOTE — ED Notes (Signed)
Called CT to check on ETA

## 2017-11-11 NOTE — ED Notes (Signed)
Patient transported to CT 

## 2017-11-11 NOTE — ED Notes (Signed)
Pt comes in with increasing shortness of breath with exertion over the last few days.

## 2017-11-11 NOTE — H&P (Signed)
History and Physical    Danielle Pratt KGU:[id number] DOB: November 16, 1950 DOA: 11/11/2017  Referring MD/NP/PA: Dr Sabra Heck PCP: Binnie Rail, MD  Outpatient Specialists: None  Patient coming from: Home  Chief Complaint: Shortness of breath and chest pain  HPI: Danielle Pratt is a 67 y.o. female with medical history significant of tonsillar cancer in 2000 as well as pulmonary embolism in March 2017 who has completed 6 months of anticoagulation treatment with Xarelto. Patient presented with progressive exertional dyspnea and cough. Also some chest discomfort. She was seen in the ER and evaluated and patient found to have bilateral subsegmental pulmonary emboli. Patient has not had any recent travel. No prolonged immobilization. Her first pulmonary embolus was triggered by immobilization after sustaining a foot fracture. This time around she has been active. She is not a smoker and has not been on any home on oral replacement therapy. Patient denied any known family history of thromboembolism. She denied any hemoptysis. Chest pain is with deep inspiration. No fever or chills. Patient is being admitted with recurrent pulmonary embolus. She had a repeat CT angiogram of the chest in general out of 2018 that showed resolution of her previous pulmonary embolus.  ED Course: Patient was evaluated and has been found to have the bilateral Emboli. Has been initiated on Heparin drip.  Review of Systems: As per HPI otherwise 10 point review of systems negative.    Past Medical History:  Diagnosis Date  . Anxiety   . Cancer ([place])    [place] ca 2000  . Cataract    extraction surgery  . Fasting hyperglycemia    110 in 01/2011;131 in 05/2007  . Hyperlipidemia   . Hypothyroidism   . Popliteal cyst     Past Surgical History:  Procedure Laterality Date  . CESAREAN SECTION     G 1 P 2  . COLONOSCOPY  2003   negative; Dr Henrene Pastor  . LYMPHADENECTOMY Right    neck  . TONSILLECTOMY AND ADENOIDECTOMY  1998   R  tonsilar malignancy; subsequent LN dissection negative  . TOTAL ABDOMINAL HYSTERECTOMY W/ BILATERAL SALPINGOOPHORECTOMY     fibroids; Dr Nori Riis  . TOTAL HIP ARTHROPLASTY  2007   Dr Maureen Ralphs, both hips 3 months apart     reports that  has never smoked. she has never used smokeless tobacco. She reports that she drinks alcohol. She reports that she does not use drugs.  No Known Allergies  Family History  Problem Relation Age of Onset  . Hyperlipidemia Mother   . Hypertension Mother   . Ovarian cancer Mother   . Aneurysm Mother        ascending & descending aorta  . Heart attack Father 5  . Leukemia Paternal Grandmother   . Lung cancer Paternal Grandfather   . Diabetes Neg Hx   . Stroke Neg Hx   . Colon cancer Neg Hx   . Stomach cancer Neg Hx   . Rectal cancer Neg Hx     Prior to Admission medications   Medication Sig Start Date End Date Taking? Authorizing Provider  aspirin EC 81 MG tablet Take 1 tablet (81 mg total) by mouth daily. Patient taking differently: Take 81 mg by mouth at bedtime.  09/05/16  Yes Burns, Claudina Lick, MD  Cholecalciferol (VITAMIN D) 2000 units tablet Take 2,000 Units by mouth at bedtime.   Yes [provider]  clobetasol cream (TEMOVATE) 6.28 % Apply 1 application topically at bedtime as needed (skin irritation/ finger).  Yes [provider]  levothyroxine (SYNTHROID, LEVOTHROID) 50 MCG tablet Take 1.5 tablets by mouth on Tues and Sat, Then 1 tablet on other days -- Office visit needed for further refills Patient taking differently: Take 50-75 mcg by mouth See admin instructions. Take 1 tablet (24mcg) by mouth on Tues and Sat, Then 1.5 tablets (51mcg) on other days. 10/08/17  Yes Burns, Claudina Lick, MD  pilocarpine (SALAGEN) 5 MG tablet Take 5 mg by mouth See admin instructions. Take 1 tablet (5 mg) by mouth 3 times daily - early morning, midday and bedtime, may take an additional tablet during the day as needed for dry mouth.   Yes [provider]  venlafaxine XR (EFFEXOR-XR) 75 MG 24 hr capsule TAKE ONE CAPSULE BY MOUTH AT BEDTIME Patient taking differently: TAKE ONE CAPSULE (75mg ) BY MOUTH AT BEDTIME 09/29/17  Yes Binnie Rail, MD    Physical Exam: Vitals:   11/11/17 2215 11/11/17 2230 11/11/17 2300 11/11/17 2330  BP: 134/86 121/77 139/81 128/81  Pulse: 83 84 87 86  Resp: 16 (!) 23 14 (!) 24  Temp:      TempSrc:      SpO2: 96% 97% 97% 99%  Weight:   69.4 kg (153 lb)   Height:   5\' 9"  (1.753 m)       Constitutional: NAD, calm, comfortable Vitals:   11/11/17 2215 11/11/17 2230 11/11/17 2300 11/11/17 2330  BP: 134/86 121/77 139/81 128/81  Pulse: 83 84 87 86  Resp: 16 (!) 23 14 (!) 24  Temp:      TempSrc:      SpO2: 96% 97% 97% 99%  Weight:   69.4 kg (153 lb)   Height:   5\' 9"  (1.753 m)    Eyes: PERRL, lids and conjunctivae normal ENMT: Mucous membranes are moist. Posterior pharynx clear of any exudate or lesions.Normal dentition.  Neck: normal, supple, no masses, no thyromegaly Respiratory: clear to auscultation bilaterally, Mild rales, no wheezing, no crackles. Normal respiratory effort. No accessory muscle use.  Cardiovascular: Regular rate and rhythm, no murmurs / rubs / gallops. No extremity edema. 2+ pedal pulses. No carotid bruits.  Abdomen: no tenderness, no masses palpated. No hepatosplenomegaly. Bowel sounds positive.  Musculoskeletal: no clubbing / cyanosis. No joint deformity upper and lower extremities. Good ROM, no contractures. Normal muscle tone.  Skin: no rashes, lesions, ulcers. No induration Neurologic: CN 2-12 grossly intact. Sensation intact, DTR normal. Strength 5/5 in all 4.  Psychiatric: Normal judgment and insight. Alert and oriented x 3. Normal mood.    Labs on Admission: I have personally reviewed following labs and imaging studies  CBC: Recent Labs  Lab 11/11/17 1617  WBC 10.2  HGB 14.7  HCT 44.9  MCV 93.7  PLT 656   Basic Metabolic Panel: Recent Labs  Lab  11/11/17 1617  NA 135  K 4.4  CL 100*  CO2 27  GLUCOSE 108*  BUN 19  CREATININE 1.13*  CALCIUM 9.2   GFR: Estimated Creatinine Clearance: 51.9 mL/min (A) (by C-G formula based on SCr of 1.13 mg/dL (H)). Liver Function Tests: No results for input(s): AST, ALT, ALKPHOS, BILITOT, PROT, ALBUMIN in the last 168 hours. No results for input(s): LIPASE, AMYLASE in the last 168 hours. No results for input(s): AMMONIA in the last 168 hours. Coagulation Profile: No results for input(s): INR, PROTIME in the last 168 hours. Cardiac Enzymes: No results for input(s): CKTOTAL, CKMB, CKMBINDEX, TROPONINI in the last 168 hours. BNP (last 3 results)  No results for input(s): PROBNP in the last 8760 hours. HbA1C: No results for input(s): HGBA1C in the last 72 hours. CBG: No results for input(s): GLUCAP in the last 168 hours. Lipid Profile: No results for input(s): CHOL, HDL, LDLCALC, TRIG, CHOLHDL, LDLDIRECT in the last 72 hours. Thyroid Function Tests: No results for input(s): TSH, T4TOTAL, FREET4, T3FREE, THYROIDAB in the last 72 hours. Anemia Panel: No results for input(s): VITAMINB12, FOLATE, FERRITIN, TIBC, IRON, RETICCTPCT in the last 72 hours. Urine analysis:    Component Value Date/Time   COLORURINE YELLOW 07/09/2017 [place] 07/09/2017 1428   LABSPEC 1.010 07/09/2017 1428   PHURINE 7.0 07/09/2017 1428   GLUCOSEU NEGATIVE 07/09/2017 1428   [place] 07/09/2017 1428   BILIRUBINUR NEGATIVE 07/09/2017 1428   KETONESUR NEGATIVE 07/09/2017 1428   PROTEINUR NEGATIVE 01/31/2016 2252   UROBILINOGEN 0.2 07/09/2017 1428   NITRITE NEGATIVE 07/09/2017 1428   LEUKOCYTESUR NEGATIVE 07/09/2017 1428   Sepsis Labs: @LABRCNTIP (procalcitonin:4,lacticidven:4) )No results found for this or any previous visit (from the past 240 hour(s)).   Radiological Exams on Admission: Dg Chest 2 View  Result Date: 11/11/2017 CLINICAL DATA:  Shortness of breath after Christmas EXAM: CHEST   2 VIEW COMPARISON:  November 17, 2016 FINDINGS: The heart size and mediastinal contours are within normal limits. The lungs are hyperinflated. There is no focal infiltrate, pulmonary edema, or pleural effusion. There is scoliosis of spine. IMPRESSION: No active cardiopulmonary disease. Electronically Signed   By: Abelardo Diesel M.D.   On: 11/11/2017 17:06   Ct Angio Chest Pe W And/or Wo Contrast  Result Date: 11/11/2017 CLINICAL DATA:  Increasing shortness of breath on exertion for several days. No chest pain. History of DVT and pulmonary embolus. Positive D-dimer. EXAM: CT ANGIOGRAPHY CHEST WITH CONTRAST TECHNIQUE: Multidetector CT imaging of the chest was performed using the standard protocol during bolus administration of intravenous contrast. Multiplanar CT image reconstructions and MIPs were obtained to evaluate the vascular anatomy. CONTRAST:  175mL ISOVUE-370 IOPAMIDOL (ISOVUE-370) INJECTION 76% COMPARISON:  11/17/2016 FINDINGS: Cardiovascular: Since previous study, there are new filling defects demonstrated in the segmental and subsegmental branches of upper and lower lobe pulmonary arteries bilaterally. This represents new development of acute on chronic pulmonary emboli. RV to LV ratio is 0.87, suggesting no evidence of right heart strain. Mild diffuse cardiac enlargement. Dilated ascending thoracic aorta at 3.2 cm diameter. No pericardial effusion. Few scattered calcifications in the aorta. Mediastinum/Nodes: No enlarged mediastinal, hilar, or axillary lymph nodes. Thyroid gland, trachea, and esophagus demonstrate no significant findings. Lungs/Pleura: Lungs are clear. No pleural effusion or pneumothorax. Upper Abdomen: No acute abnormality. Musculoskeletal: No chest wall abnormality. No acute or significant osseous findings. Review of the MIP images confirms the above findings. IMPRESSION: 1. New acute pulmonary emboli demonstrated in segmental and subsegmental pulmonary artery branches bilaterally. No  evidence of right heart strain. 2. Mild cardiac enlargement. 3. No evidence of active pulmonary disease. 4. Aortic atherosclerosis. Mild dilatation of the ascending thoracic aorta. These results were called by telephone at the time of interpretation on 11/11/2017 at 11:27 pm to PA. ABIGAIL HARRIS , who verbally acknowledged these results. Aortic Atherosclerosis (ICD10-I70.0). Electronically Signed   By: Lucienne Capers M.D.   On: 11/11/2017 23:30    EKG: Independently reviewed.   Assessment/Plan Principal Problem:   Pulmonary emboli [facility]) Active Problems:   Hypothyroidism   Anxiety   Pulmonary embolism (HCC)   #1 bilateral pulmonary emboli: Patient will be admitted and started on IV  heparin. She has been informed and educated on the fact that she has to be on chronic anticoagulation for the results of her life since this is recurrent for her. She was previously on Xarelto and will likely be transitioned to the same the next 24 hours if she remains stable. Admit to telemetry bed. Start IV heparin drip and consider echocardiogram to reevaluate her right heart pressure.  #2 hypothyroidism: We will continue his levothyroxine. Continue his home dose.  #3 anxiety disorder: Patient is on SSRI. Continue with home regimen. Counseling provided.  #4 history of tonsillar cancer: With this history we may need to evaluate patient for risk factors including recurrent cancer of some type which may be have risk factor for pulmonary emboli. So far no symptoms to indicate any other medical issues. She was fully treated for her previous tonsillar cancer since 2000.   DVT prophylaxis: Heparin  Code Status: Full  Family Communication: Patient. No family available at the moment but patient able to make her own decision.  Disposition Plan: Home  Consults called: None  Admission status: Inpatient (  Severity of Illness: The appropriate patient status for this patient is INPATIENT. Inpatient status is judged to  be reasonable and necessary in order to provide the required intensity of service to ensure the patient's safety. The patient's presenting symptoms, physical exam findings, and initial radiographic and laboratory data in the context of their chronic comorbidities is felt to place them at high risk for further clinical deterioration. Furthermore, it is not anticipated that the patient will be medically stable for discharge from the hospital within 2 midnights of admission. The following factors support the patient status of inpatient.   " The patient's presenting symptoms include shortness of breath and hypoxia. " The worrisome physical exam findings include hypoxemia. " The initial radiographic and laboratory data are worrisome because of bilateral pulmonary emboli on CT angiogram. " The chronic co-morbidities include hypothyroidism and previous pulmonary emboli.   * I certify that at the point of admission it is my clinical judgment that the patient will require inpatient hospital care spanning beyond 2 midnights from the point of admission due to high intensity of service, high risk for further deterioration and high frequency of surveillance required.Barbette Merino MD [facility] Pager [phone]  If 7PM-7AM, please contact night-coverage [email or web address] Password [facility]  11/11/2017, 11:48 PM

## 2017-11-11 NOTE — ED Provider Notes (Signed)
[place] EMERGENCY DEPARTMENT Provider Note   CSN: [id number] Arrival date & time: 11/11/17  1518     History   Chief Complaint Chief Complaint  Patient presents with  . Shortness of Breath    HPI Kathya Wilz is a 67 y.o. female who presents the emergency department with chief complaint of shortness of breath.  Patient states that for the past month she has had shortness of breath and been feeling more winded doing activities of daily living that normally would be easy for her.  This sensation acutely worsened today.  Patient states that she was significantly short of breath.  She states that she climbed one set of stairs in her house and had to sit down on the floor because she was so winded she could not breathe.  She has a past medical history of previous time her cancer that was removed about 25 years ago.  She has a history of DVT and pulmonary embolus after hip dislocation and immobilization.  She denies any chest pain, melena or hematochezia.  She is not on any blood thinning medications.  HPI  Past Medical History:  Diagnosis Date  . Anxiety   . Cancer ([place])    [place] ca 2000  . Cataract    extraction surgery  . Fasting hyperglycemia    110 in 01/2011;131 in 05/2007  . Hyperlipidemia   . Hypothyroidism   . Popliteal cyst     Patient Active Problem List   Diagnosis Date Noted  . Closed fracture of sesamoid bone of foot 06/18/2017  . Postnasal drip 12/02/2016  . Hip dislocation, right ([place]) 02/20/2016  . DVT, RLE 01/31/16 02/20/2016  . Anxiety 02/20/2016  . Pulmonary emboli ([place]) 01/31/2016  . Multiple pigmented nevi 07/28/2015  . Unruptured popliteal cyst 09/07/2013  . HYPERLIPIDEMIA 01/01/2010  . LEFT BUNDLE BRANCH BLOCK 01/01/2010  . OSTEOARTHRITIS 01/01/2010  . Malignant neoplasm of tonsillar fossa ([place]) 12/27/2008  . Hypothyroidism 07/17/2007    Past Surgical History:  Procedure Laterality Date  . CESAREAN SECTION     G 1 P 2  .  COLONOSCOPY  2003   negative; Dr Henrene Pastor  . LYMPHADENECTOMY Right    neck  . TONSILLECTOMY AND ADENOIDECTOMY  1998   R tonsilar malignancy; subsequent LN dissection negative  . TOTAL ABDOMINAL HYSTERECTOMY W/ BILATERAL SALPINGOOPHORECTOMY     fibroids; Dr Nori Riis  . TOTAL HIP ARTHROPLASTY  2007   Dr Maureen Ralphs, both hips 3 months apart    OB History    No data available       Home Medications    Prior to Admission medications   Medication Sig Start Date End Date Taking? Authorizing Provider  aspirin EC 81 MG tablet Take 1 tablet (81 mg total) by mouth daily. Patient taking differently: Take 81 mg by mouth at bedtime.  09/05/16  Yes Burns, Claudina Lick, MD  Cholecalciferol (VITAMIN D) 2000 units tablet Take 2,000 Units by mouth at bedtime.   Yes [provider]  clobetasol cream (TEMOVATE) 4.09 % Apply 1 application topically at bedtime as needed (skin irritation/ finger).   Yes [provider]  levothyroxine (SYNTHROID, LEVOTHROID) 50 MCG tablet Take 1.5 tablets by mouth on Tues and Sat, Then 1 tablet on other days -- Office visit needed for further refills Patient taking differently: Take 50-75 mcg by mouth See admin instructions. Take 1 tablet (53mcg) by mouth on Tues and Sat, Then 1.5 tablets (70mcg) on other days. 10/08/17  Yes Billey Gosling  J, MD  pilocarpine (SALAGEN) 5 MG tablet Take 5 mg by mouth See admin instructions. Take 1 tablet (5 mg) by mouth 3 times daily - early morning, midday and bedtime, may take an additional tablet during the day as needed for dry mouth.   Yes [provider]  venlafaxine XR (EFFEXOR-XR) 75 MG 24 hr capsule TAKE ONE CAPSULE BY MOUTH AT BEDTIME Patient taking differently: TAKE ONE CAPSULE (75mg ) BY MOUTH AT BEDTIME 09/29/17  Yes Burns, Claudina Lick, MD    Family History Family History  Problem Relation Age of Onset  . Hyperlipidemia Mother   . Hypertension Mother   . Ovarian cancer Mother   . Aneurysm Mother        ascending &  descending aorta  . Heart attack Father 47  . Leukemia Paternal Grandmother   . Lung cancer Paternal Grandfather   . Diabetes Neg Hx   . Stroke Neg Hx   . Colon cancer Neg Hx   . Stomach cancer Neg Hx   . Rectal cancer Neg Hx     Social History Social History   Tobacco Use  . Smoking status: Never Smoker  . Smokeless tobacco: Never Used  Substance Use Topics  . Alcohol use: Yes    Alcohol/week: 0.0 oz    Comment:  3/week  . Drug use: No     Allergies   Patient has no known allergies.   Review of Systems Review of Systems  Ten systems reviewed and are negative for acute change, except as noted in the HPI.    Physical Exam Updated Vital Signs BP 136/82   Pulse 88   Temp 98.6 F (37 C) (Oral)   Resp 15   SpO2 93%   Physical Exam  Constitutional: She is oriented to person, place, and time. She appears well-developed and well-nourished. No distress.  HENT:  Head: Normocephalic and atraumatic.  Eyes: Conjunctivae are normal. No scleral icterus.  Neck: Normal range of motion. No JVD present.  Cardiovascular: Normal rate, regular rhythm and normal heart sounds. Exam reveals no gallop and no friction rub.  No murmur heard. Pulmonary/Chest: Effort normal. No respiratory distress. She has no decreased breath sounds. She has no wheezes. She has rhonchi in the left middle field. She has no rales.  Abdominal: Soft. Bowel sounds are normal. She exhibits no distension and no mass. There is no tenderness. There is no guarding.  Musculoskeletal:       Right lower leg: She exhibits no edema.       Left lower leg: She exhibits no edema.  Neurological: She is alert and oriented to person, place, and time.  Skin: Skin is warm and dry. She is not diaphoretic.  Psychiatric: Her behavior is normal.  Nursing note and vitals reviewed.    ED Treatments / Results  Labs (all labs ordered are listed, but only abnormal results are displayed) Labs Reviewed  BASIC METABOLIC PANEL -  Abnormal; Notable for the following components:      Result Value   Chloride 100 (*)    Glucose, Bld 108 (*)    Creatinine, Ser 1.13 (*)    GFR calc non Af Amer 50 (*)    GFR calc Af Amer 58 (*)    All other components within normal limits  D-DIMER, QUANTITATIVE (NOT AT [facility]) - Abnormal; Notable for the following components:   D-Dimer, Quant 2.54 (*)    All other components within normal limits  CBC  I-STAT TROPONIN, ED  EKG  EKG Interpretation  Date/Time:  Tuesday November 11 2017 16:12:32 EST Ventricular Rate:  92 PR Interval:  188 QRS Duration: 118 QT Interval:  394 QTC Calculation: 487 R Axis:   -98 Text Interpretation:  Normal sinus rhythm wander makes interpretation difficult ST elevation consider anterior injury or acute infarct Left atrial enlargement Repeat tracings suggested Confirmed by Virgel Manifold [id number]) on 11/11/2017 4:20:45 PM       Radiology Dg Chest 2 View  Result Date: 11/11/2017 CLINICAL DATA:  Shortness of breath after Christmas EXAM: CHEST  2 VIEW COMPARISON:  November 17, 2016 FINDINGS: The heart size and mediastinal contours are within normal limits. The lungs are hyperinflated. There is no focal infiltrate, pulmonary edema, or pleural effusion. There is scoliosis of spine. IMPRESSION: No active cardiopulmonary disease. Electronically Signed   By: Abelardo Diesel M.D.   On: 11/11/2017 17:06    Procedures .Critical Care Performed by: Margarita Mail, PA-C Authorized by: Margarita Mail, PA-C   Critical care provider statement:    Critical care time (minutes):  50   Critical care was necessary to treat or prevent imminent or life-threatening deterioration of the following conditions: BL pulmonary emboli and hypoxia.   Critical care was time spent personally by me on the following activities:  Development of treatment plan with patient or surrogate, discussions with consultants, evaluation of patient's response to treatment, examination of patient,  ordering and performing treatments and interventions, ordering and review of laboratory studies and ordering and review of radiographic studies   (including critical care time)  Medications Ordered in ED Medications - No data to display   Initial Impression / Assessment and Plan / ED Course  I have reviewed the triage vital signs and the nursing notes.  Pertinent labs & imaging results that were available during my care of the patient were reviewed by me and considered in my medical decision making (see chart for details).  Clinical Course as of Nov 12 36  Tue Nov 11, 2017  2333 CT Angie of positive for segmental and subsegmental pulmonary emboli bilaterally.  These appear to be acute.  No right heart strain.  Patient will be admitted for pulmonary emboli.  [AH]    Clinical Course User Index [AH] Margarita Mail, PA-C   Patient with SOB, positive D-dimer.I have ordered a CT angio.   Final Clinical Impressions(s) / ED Diagnoses   Final diagnoses:  Other acute pulmonary embolism without acute cor pulmonale [facility])    ED Discharge Orders    None       Margarita Mail, PA-C 11/13/17 0134    Noemi Chapel, MD 11/17/17 1041

## 2017-11-11 NOTE — ED Notes (Signed)
Heparin verified with Stefan Church RN

## 2017-11-12 ENCOUNTER — Encounter (HOSPITAL_COMMUNITY): Payer: Self-pay | Admitting: General Practice

## 2017-11-12 ENCOUNTER — Other Ambulatory Visit: Payer: Self-pay

## 2017-11-12 LAB — COMPREHENSIVE METABOLIC PANEL
ALK PHOS: 75 U/L (ref 38–126)
ALT: 20 U/L (ref 14–54)
AST: 24 U/L (ref 15–41)
Albumin: 3.6 g/dL (ref 3.5–5.0)
Anion gap: 8 (ref 5–15)
BUN: 17 mg/dL (ref 6–20)
CALCIUM: 8.9 mg/dL (ref 8.9–10.3)
CO2: 27 mmol/L (ref 22–32)
CREATININE: 1.07 mg/dL — AB (ref 0.44–1.00)
Chloride: 102 mmol/L (ref 101–111)
GFR, EST NON AFRICAN AMERICAN: 53 mL/min — AB (ref >60)
Glucose, Bld: 100 mg/dL — ABNORMAL HIGH (ref 65–99)
Potassium: 4.1 mmol/L (ref 3.5–5.1)
SODIUM: 137 mmol/L (ref 135–145)
Total Bilirubin: 1 mg/dL (ref 0.3–1.2)
Total Protein: 6.5 g/dL (ref 6.5–8.1)

## 2017-11-12 LAB — CBC
HCT: 42.6 % (ref 36.0–46.0)
HEMATOCRIT: 42.3 % (ref 36.0–46.0)
HEMOGLOBIN: 14 g/dL (ref 12.0–15.0)
Hemoglobin: 13.9 g/dL (ref 12.0–15.0)
MCH: 30.5 pg (ref 26.0–34.0)
MCH: 30.6 pg (ref 26.0–34.0)
MCHC: 32.9 g/dL (ref 30.0–36.0)
MCHC: 32.9 g/dL (ref 30.0–36.0)
MCV: 93 fL (ref 78.0–100.0)
MCV: 93 fL (ref 78.0–100.0)
Platelets: 233 10*3/uL (ref 150–400)
Platelets: 217 10*3/uL (ref 150–400)
RBC: 4.55 MIL/uL (ref 3.87–5.11)
RBC: 4.58 MIL/uL (ref 3.87–5.11)
RDW: 14.2 % (ref 11.5–15.5)
RDW: 14.3 % (ref 11.5–15.5)
WBC: 7.2 10*3/uL (ref 4.0–10.5)
WBC: 5.7 10*3/uL (ref 4.0–10.5)

## 2017-11-12 LAB — PROTIME-INR
INR: 1.12
Prothrombin Time: 14.3 seconds (ref 11.4–15.2)

## 2017-11-12 LAB — HEPARIN LEVEL (UNFRACTIONATED)
HEPARIN UNFRACTIONATED: 1.1 [IU]/mL — AB (ref 0.30–0.70)
Heparin Unfractionated: 0.71 IU/mL — ABNORMAL HIGH (ref 0.30–0.70)

## 2017-11-12 MED ORDER — ASPIRIN EC 81 MG PO TBEC
81.0000 mg | DELAYED_RELEASE_TABLET | Freq: Every day | ORAL | Status: DC
  Administered 2017-11-12 – 2017-11-15 (×4): 81 mg via ORAL
  Filled 2017-11-12 (×4): qty 1

## 2017-11-12 MED ORDER — SODIUM CHLORIDE 0.45 % IV SOLN
INTRAVENOUS | Status: DC
  Administered 2017-11-12 – 2017-11-13 (×4): via INTRAVENOUS
  Administered 2017-11-14: 75 mL/h via INTRAVENOUS

## 2017-11-12 MED ORDER — ONDANSETRON HCL 4 MG PO TABS: 4.0000 mg | ORAL_TABLET | Freq: Four times a day (QID) | ORAL | Status: DC | PRN

## 2017-11-12 MED ORDER — PILOCARPINE HCL 5 MG PO TABS
5.0000 mg | ORAL_TABLET | Freq: Every day | ORAL | Status: DC | PRN
  Filled 2017-11-12: qty 1

## 2017-11-12 MED ORDER — HYDROCODONE-ACETAMINOPHEN 5-325 MG PO TABS: 1.0000 | ORAL_TABLET | ORAL | Status: DC | PRN

## 2017-11-12 MED ORDER — LEVOTHYROXINE SODIUM 75 MCG PO TABS
75.0000 ug | ORAL_TABLET | ORAL | Status: DC
  Administered 2017-11-13 – 2017-11-14 (×2): 75 ug via ORAL
  Filled 2017-11-12 (×2): qty 1

## 2017-11-12 MED ORDER — HEPARIN (PORCINE) IN NACL 100-0.45 UNIT/ML-% IJ SOLN
850.0000 [IU]/h | INTRAMUSCULAR | Status: DC
  Administered 2017-11-12: 850 [IU]/h via INTRAVENOUS
  Administered 2017-11-12: 950 [IU]/h via INTRAVENOUS
  Administered 2017-11-14: 850 [IU]/h via INTRAVENOUS
  Filled 2017-11-12 (×2): qty 250

## 2017-11-12 MED ORDER — CLOBETASOL PROPIONATE 0.05 % EX CREA: 1.0000 "application " | TOPICAL_CREAM | Freq: Every evening | CUTANEOUS | Status: DC | PRN

## 2017-11-12 MED ORDER — PILOCARPINE HCL 5 MG PO TABS
5.0000 mg | ORAL_TABLET | Freq: Three times a day (TID) | ORAL | Status: DC
  Administered 2017-11-12 – 2017-11-15 (×9): 5 mg via ORAL
  Filled 2017-11-12 (×11): qty 1

## 2017-11-12 MED ORDER — ACETAMINOPHEN 325 MG PO TABS: 650.0000 mg | ORAL_TABLET | Freq: Four times a day (QID) | ORAL | Status: DC | PRN

## 2017-11-12 MED ORDER — ACETAMINOPHEN 650 MG RE SUPP: 650.0000 mg | Freq: Four times a day (QID) | RECTAL | Status: DC | PRN

## 2017-11-12 MED ORDER — ONDANSETRON HCL 4 MG/2ML IJ SOLN: 4.0000 mg | Freq: Four times a day (QID) | INTRAMUSCULAR | Status: DC | PRN

## 2017-11-12 MED ORDER — VITAMIN D 1000 UNITS PO TABS
2000.0000 [IU] | ORAL_TABLET | Freq: Every day | ORAL | Status: DC
  Administered 2017-11-12 – 2017-11-14 (×3): 2000 [IU] via ORAL
  Filled 2017-11-12 (×3): qty 2

## 2017-11-12 MED ORDER — LEVOTHYROXINE SODIUM 50 MCG PO TABS
50.0000 ug | ORAL_TABLET | ORAL | Status: DC
  Administered 2017-11-15: 50 ug via ORAL
  Filled 2017-11-12: qty 1

## 2017-11-12 MED ORDER — VENLAFAXINE HCL ER 75 MG PO CP24
75.0000 mg | ORAL_CAPSULE | Freq: Every day | ORAL | Status: DC
  Administered 2017-11-12 – 2017-11-14 (×3): 75 mg via ORAL
  Filled 2017-11-12 (×4): qty 1

## 2017-11-12 NOTE — Progress Notes (Signed)
ANTICOAGULATION CONSULT NOTE  Pharmacy Consult for heparin Indication: pulmonary embolus  No Known Allergies  Patient Measurements: Height: 5\' 9"  (175.3 cm) Weight: 153 lb (69.4 kg) IBW/kg (Calculated) : 66.2  Vital Signs: BP: 102/63 (01/02 0700) Pulse Rate: 77 (01/02 0700)  Labs: Recent Labs    11/11/17 1617 11/12/17 0041 11/12/17 0338 11/12/17 0730  HGB 14.7  --  13.9  --   HCT 44.9  --  42.3  --   PLT 233  --  233  --   LABPROT  --  14.3  --   --   INR  --  1.12  --   --   HEPARINUNFRC  --   --   --  1.10*  CREATININE 1.13*  --   --   --     Estimated Creatinine Clearance: 51.9 mL/min (A) (by C-G formula based on SCr of 1.13 mg/dL (H)).   Medical History: Past Medical History:  Diagnosis Date  . Anxiety   . Cancer ([place])    [place] ca 2000  . Cataract    extraction surgery  . Fasting hyperglycemia    110 in 01/2011;131 in 05/2007  . Hyperlipidemia   . Hypothyroidism   . Popliteal cyst     Assessment: 67yo female c/o worsening SOB w/ exertion, to begin heparin for PE, no R heart strain.  Of note pt had PE in 2017 tx'd w/ Xarelto, stopped after 20mo of treatment.   Initial heparin level is supratherapeutic, patient is not having any issues due to this. cbc ok.   Goal of Therapy:  Heparin level 0.3-0.7 units/ml Monitor platelets by anticoagulation protocol: Yes    Plan:  -Hold heparin for 1 hour -Reduce heparin to 950 units/hr -Daily HL, CBC -Repeat HL this afernoon    Harvel Quale  11/12/2017 8:34 AM

## 2017-11-12 NOTE — Progress Notes (Signed)
ANTICOAGULATION CONSULT NOTE  Pharmacy Consult for heparin Indication: pulmonary embolus  No Known Allergies  Patient Measurements: Height: 5\' 9"  (175.3 cm) Weight: 145 lb 1.6 oz (65.8 kg) IBW/kg (Calculated) : 66.2  Vital Signs: Temp: 97.5 F (36.4 C) (01/02 1638) Temp Source: Axillary (01/02 1638) BP: 131/90 (01/02 1638) Pulse Rate: 84 (01/02 1638)  Labs: Recent Labs    11/11/17 1617 11/12/17 0041 11/12/17 0338 11/12/17 0730 11/12/17 1000 11/12/17 1641  HGB 14.7  --  13.9  --  14.0  --   HCT 44.9  --  42.3  --  42.6  --   PLT 233  --  233  --  217  --   LABPROT  --  14.3  --   --   --   --   INR  --  1.12  --   --   --   --   HEPARINUNFRC  --   --   --  1.10*  --  0.71*  CREATININE 1.13*  --   --   --  1.07*  --     Estimated Creatinine Clearance: 54.4 mL/min (A) (by C-G formula based on SCr of 1.07 mg/dL (H)).   Medical History: Past Medical History:  Diagnosis Date  . Anxiety   . Cancer ([place])    [place] ca 2000  . Cataract    extraction surgery  . Fasting hyperglycemia    110 in 01/2011;131 in 05/2007  . Hyperlipidemia   . Hypothyroidism   . Popliteal cyst     Assessment: 67yo female c/o worsening SOB w/ exertion, to begin heparin for PE, no R heart strain.  Of note pt had PE in 2017 tx'd w/ Xarelto, stopped after 77mo of treatment.   Follow up heparin level is still slightly supratherapeutic, no bleeding issues noted, rate adjusted this am after high level.   Goal of Therapy:  Heparin level 0.3-0.7 units/ml Monitor platelets by anticoagulation protocol: Yes    Plan:  Reduce heparin to 850 units/hour Recheck in am Plan to transition to [place] PharmD., BCPS Clinical Pharmacist Pager [phone] 11/12/2017 5:38 PM

## 2017-11-12 NOTE — ED Notes (Signed)
Heparin stopped - per pharmacy. Heparin level elevated. Will restart at 1000 at lower rate.

## 2017-11-12 NOTE — Progress Notes (Signed)
PROGRESS NOTE  Danielle Pratt HUT:[id number] DOB: 03-09-1951 DOA: 11/11/2017 PCP: Binnie Rail, MD  HPI/Recap of past 24 hours:  Feeling better, no chest pain, no hypoxia at rest  Assessment/Plan: Principal Problem:   Pulmonary emboli (HCC) Active Problems:   Hypothyroidism   Anxiety   Pulmonary embolism ([place])  Recurrent PE Unclear etiology She is started on heparin drip, echo/venous doppler pending Likely able to transition to NOAc tomorrow and possible d/c tomorrow.  CKDII Cr stable at baseline  Hypothroidism: continue synthroid  H/o tonsillar cancer  S/p surgical resection and XRT for tonsillar cancer in  2000 She denies concerns of recurrent, no recent weight loss. She is advised to follow up with hematology/oncology for this    Code Status: full  Family Communication: patient   Disposition Plan: home, likely on 1/3   Consultants:  none  Procedures:  none  Antibiotics:  none   Objective: BP 126/89   Pulse 81   Temp 98.6 F (37 C) (Oral)   Resp 17   Ht 5\' 9"  (1.753 m)   Wt 69.4 kg (153 lb)   SpO2 98%   BMI 22.59 kg/m   Intake/Output Summary (Last 24 hours) at 11/12/2017 1346 Last data filed at 11/12/2017 0848 Gross per 24 hour  Intake 98.27 ml  Output -  Net 98.27 ml   Filed Weights   11/11/17 2300  Weight: 69.4 kg (153 lb)    Exam: Patient is examined daily including today on 11/12/2017, exams remain the same as of yesterday except that has changed    General:  NAD  Cardiovascular: RRR  Respiratory: CTABL  Abdomen: Soft/ND/NT, positive BS  Musculoskeletal: No Edema  Neuro: alert, oriented   Data Reviewed: Basic Metabolic Panel: Recent Labs  Lab 11/11/17 1617 11/12/17 1000  NA 135 137  K 4.4 4.1  CL 100* 102  CO2 27 27  GLUCOSE 108* 100*  BUN 19 17  CREATININE 1.13* 1.07*  CALCIUM 9.2 8.9   Liver Function Tests: Recent Labs  Lab 11/12/17 1000  AST 24  ALT 20  ALKPHOS 75  BILITOT 1.0  PROT 6.5  ALBUMIN  3.6   No results for input(s): LIPASE, AMYLASE in the last 168 hours. No results for input(s): AMMONIA in the last 168 hours. CBC: Recent Labs  Lab 11/11/17 1617 11/12/17 0338 11/12/17 1000  WBC 10.2 7.2 5.7  HGB 14.7 13.9 14.0  HCT 44.9 42.3 42.6  MCV 93.7 93.0 93.0  PLT 233 233 217   Cardiac Enzymes:   No results for input(s): CKTOTAL, CKMB, CKMBINDEX, TROPONINI in the last 168 hours. BNP (last 3 results) Recent Labs    11/11/17 1617  BNP 164.3*    ProBNP (last 3 results) No results for input(s): PROBNP in the last 8760 hours.  CBG: No results for input(s): GLUCAP in the last 168 hours.  No results found for this or any previous visit (from the past 240 hour(s)).   Studies: Dg Chest 2 View  Result Date: 11/11/2017 CLINICAL DATA:  Shortness of breath after Christmas EXAM: CHEST  2 VIEW COMPARISON:  November 17, 2016 FINDINGS: The heart size and mediastinal contours are within normal limits. The lungs are hyperinflated. There is no focal infiltrate, pulmonary edema, or pleural effusion. There is scoliosis of spine. IMPRESSION: No active cardiopulmonary disease. Electronically Signed   By: Abelardo Diesel M.D.   On: 11/11/2017 17:06   Ct Angio Chest Pe W And/or Wo Contrast  Result Date: 11/11/2017 CLINICAL DATA:  Increasing shortness of breath on exertion for several days. No chest pain. History of DVT and pulmonary embolus. Positive D-dimer. EXAM: CT ANGIOGRAPHY CHEST WITH CONTRAST TECHNIQUE: Multidetector CT imaging of the chest was performed using the standard protocol during bolus administration of intravenous contrast. Multiplanar CT image reconstructions and MIPs were obtained to evaluate the vascular anatomy. CONTRAST:  160mL ISOVUE-370 IOPAMIDOL (ISOVUE-370) INJECTION 76% COMPARISON:  11/17/2016 FINDINGS: Cardiovascular: Since previous study, there are new filling defects demonstrated in the segmental and subsegmental branches of upper and lower lobe pulmonary arteries  bilaterally. This represents new development of acute on chronic pulmonary emboli. RV to LV ratio is 0.87, suggesting no evidence of right heart strain. Mild diffuse cardiac enlargement. Dilated ascending thoracic aorta at 3.2 cm diameter. No pericardial effusion. Few scattered calcifications in the aorta. Mediastinum/Nodes: No enlarged mediastinal, hilar, or axillary lymph nodes. Thyroid gland, trachea, and esophagus demonstrate no significant findings. Lungs/Pleura: Lungs are clear. No pleural effusion or pneumothorax. Upper Abdomen: No acute abnormality. Musculoskeletal: No chest wall abnormality. No acute or significant osseous findings. Review of the MIP images confirms the above findings. IMPRESSION: 1. New acute pulmonary emboli demonstrated in segmental and subsegmental pulmonary artery branches bilaterally. No evidence of right heart strain. 2. Mild cardiac enlargement. 3. No evidence of active pulmonary disease. 4. Aortic atherosclerosis. Mild dilatation of the ascending thoracic aorta. These results were called by telephone at the time of interpretation on 11/11/2017 at 11:27 pm to PA. ABIGAIL HARRIS , who verbally acknowledged these results. Aortic Atherosclerosis (ICD10-I70.0). Electronically Signed   By: Lucienne Capers M.D.   On: 11/11/2017 23:30    Scheduled Meds: . aspirin EC  81 mg Oral Daily  . cholecalciferol  2,000 Units Oral QHS  . [START ON 11/15/2017] levothyroxine  50 mcg Oral Once per day on Tue Sat  . [START ON 11/13/2017] levothyroxine  75 mcg Oral Once per day on Sun Mon Wed Thu Fri  . pilocarpine  5 mg Oral Q8H  . venlafaxine XR  75 mg Oral QHS    Continuous Infusions: . sodium chloride 100 mL/hr at 11/12/17 0959  . heparin 950 Units/hr (11/12/17 1000)     Time spent: 25 mins I have personally reviewed and interpreted on  11/12/2017 daily labs, tele strips, imagings as discussed above under date review session and assessment and plans.  I reviewed all nursing notes,  pharmacy notes, vitals, pertinent old records  I have discussed plan of care as described above with  patient  on 11/12/2017   Florencia Reasons MD, PhD  [facility] Pager [phone]. If 7PM-7AM, please contact night-coverage at [email or web address], password [facility] 11/12/2017, 1:46 PM  LOS: 1 day

## 2017-11-12 NOTE — ED Notes (Signed)
Ordered breakfast tray  

## 2017-11-13 ENCOUNTER — Inpatient Hospital Stay (HOSPITAL_COMMUNITY): Payer: PPO

## 2017-11-13 DIAGNOSIS — I2699 Other pulmonary embolism without acute cor pulmonale: Principal | ICD-10-CM

## 2017-11-13 DIAGNOSIS — F419 Anxiety disorder, unspecified: Secondary | ICD-10-CM

## 2017-11-13 DIAGNOSIS — J9601 Acute respiratory failure with hypoxia: Secondary | ICD-10-CM

## 2017-11-13 DIAGNOSIS — E039 Hypothyroidism, unspecified: Secondary | ICD-10-CM

## 2017-11-13 DIAGNOSIS — R0602 Shortness of breath: Secondary | ICD-10-CM

## 2017-11-13 DIAGNOSIS — C090 Malignant neoplasm of tonsillar fossa: Secondary | ICD-10-CM

## 2017-11-13 LAB — CBC
HEMATOCRIT: 44 % (ref 36.0–46.0)
Hemoglobin: 14.2 g/dL (ref 12.0–15.0)
MCH: 29.6 pg (ref 26.0–34.0)
MCHC: 32.3 g/dL (ref 30.0–36.0)
MCV: 91.7 fL (ref 78.0–100.0)
PLATELETS: 243 10*3/uL (ref 150–400)
RBC: 4.8 MIL/uL (ref 3.87–5.11)
RDW: 13.6 % (ref 11.5–15.5)
WBC: 6.7 10*3/uL (ref 4.0–10.5)

## 2017-11-13 LAB — BASIC METABOLIC PANEL
ANION GAP: 9 (ref 5–15)
BUN: 12 mg/dL (ref 6–20)
CALCIUM: 9.5 mg/dL (ref 8.9–10.3)
CO2: 28 mmol/L (ref 22–32)
Chloride: 102 mmol/L (ref 101–111)
Creatinine, Ser: 0.85 mg/dL (ref 0.44–1.00)
Glucose, Bld: 94 mg/dL (ref 65–99)
POTASSIUM: 3.8 mmol/L (ref 3.5–5.1)
Sodium: 139 mmol/L (ref 135–145)

## 2017-11-13 LAB — HIV ANTIBODY (ROUTINE TESTING W REFLEX): HIV Screen 4th Generation wRfx: NONREACTIVE

## 2017-11-13 LAB — MAGNESIUM: Magnesium: 2.1 mg/dL (ref 1.7–2.4)

## 2017-11-13 LAB — HEPARIN LEVEL (UNFRACTIONATED): HEPARIN UNFRACTIONATED: 0.52 [IU]/mL (ref 0.30–0.70)

## 2017-11-13 MED ORDER — IOPAMIDOL (ISOVUE-370) INJECTION 76%
INTRAVENOUS | Status: AC
  Administered 2017-11-13: 100 mL
  Filled 2017-11-13: qty 100

## 2017-11-13 NOTE — Progress Notes (Signed)
PROGRESS NOTE    Danielle Pratt  CWC:[id number] DOB: [birth year] DOA: 11/11/2017 PCP: Binnie Rail, MD   Brief Narrative:  Danielle Pratt is a 67 y.o. female with medical history significant of tonsillar cancer in 2000 as well as pulmonary embolism in March 2017 who has completed 6 months of anticoagulation treatment with Xarelto along with other comorbids. Patient presented with progressive exertional dyspnea and cough. Also some chest discomfort. She was seen in the ER and evaluated and patient found to have bilateral subsegmental pulmonary emboli. Patient has not had any recent travel but stated she did go to [place] earlier this year. No prolonged immobilization. Her first pulmonary embolus was triggered by immobilization after sustaining a foot fracture. This time around she has been active. She is not a smoker and has not been on any home on oral replacement therapy. Patient denied any known family history of thromboembolism. She denied any hemoptysis. Chest pain is with deep inspiration. No fever or chills. Patient was admitted with recurrent pulmonary embolus as she had a repeat CT angiogram of the chest in January of 2018 that showed resolution of her previous pulmonary embolus. ECHO and LE Duplex was done day and patient ambulated. She was hypoxic Room Air by ambulation and after discussion with IR, will repeat CTA to see if clot burden has increased.  Assessment & Plan:   Principal Problem:   Pulmonary emboli (HCC) Active Problems:   Hypothyroidism   Anxiety   Pulmonary embolism (HCC)  Acute Respiratory Failure with Hypoxia 2/2 Acute PE -Patient desaturated when Ambulating on Room Air -Will need O2 at D/C  -Discussed case with Dr. Pearline Cables of PCCM who recommended continuing Anticoagulation and Discussing case with IR for further Recc's -Discussed Case with IR and recommended repeating CTA to evaluate if clot burden has gotten worse -Will obtain CXR -Will repeat CTA of Chest to further  evaluate clot burden per IR Recommendations   Recurrent PE -Unclear etiology -CTA 11/11/17 showed New acute pulmonary emboli demonstrated in segmental and subsegmental pulmonary artery branches bilaterally. No evidence of right heart strain.  Mild cardiac enlargement. No evidence of active pulmonary disease.  Aortic atherosclerosis. Mild dilatation of the ascending thoracic -C/w Heparin gtt and transition to NOAC Eliquis in AM -ECHO showed Compared to a prior study in 2017, the LVEF is higher at 60-65%. There is again a severely dilated RA and moderately dilated and mildly hypokinetic RV with septal flattening suggestive of pressure overload of the RV, however, the degree of TR is mild and RVSP only estimates at 22 mmHg. If pulmonary hypertension is suspected, consider RHC for a definitive pulmonary artery measurement. -LE Duplex showed No evidence of deep vein thrombosis bilaterally, left sided baker's cyst noted. -After Discussion with IR and Pulmonary will repeat CTA; If not worse will D/C in AM -Likely able to transition to NOAc tomorrow and possible d/c tomorrow. -Will need Hematology/Oncology follow up for Hypercoagulable workup  CKDII -Cr stable at baseline -Patient's BUN/Cr went from 19/1.13 -> 17/1.07 -> 12/0.85 -Avoid Nephrotoxins if possible -C/w IVF Rehydration with 1/2 NS at 75 mL/hr -Repeat CMP in AM   Hypothyroidism -C/w Levothyroxine 50 mcg po Tues/Sat and with 75 mcg Sun/Mon/Wed/Th/Fri  Hx of Tonsillar Cancer  -S/p surgical resection and XRT for tonsillar cancer in  2000 -She denies concerns of recurrent, no recent weight loss. -She is advised to follow up with hematology/oncology for this  DVT prophylaxis: Anticoagulated with Heparin gtt Code Status: FULL CODE Family Communication: No family present  at bedside Disposition Plan: D/C Home in AM  Consultants:   Discussed Case with PCCM Dr. Pearline Cables  Discussed Case with Interventional Radiology Dr. Anselm Pancoast   Procedures:   ECHOCARDIOGRAM ------------------------------------------------------------------- Study Conclusions  - Left ventricle: The cavity size was normal. Wall thickness was   increased in a pattern of mild LVH. Systolic function was normal.   The estimated ejection fraction was in the range of 60% to 65%.   The study is not technically sufficient to allow evaluation of LV   diastolic function. - Ventricular septum: Septal motion showed paradox. The contour   showed diastolic flattening and systolic flattening. - Aortic valve: Trileaflet. Sclerosis without stenosis. There was   trivial regurgitation. - Mitral valve: Mildly thickened leaflets . There was trivial   regurgitation. - Left atrium: The atrium was mildly dilated. - Right ventricle: Moderately dilated. - Right atrium: Severely dilated. - Tricuspid valve: There was trivial regurgitation. - Pulmonary arteries: PA peak pressure: 22 mm Hg (S). - Inferior vena cava: The vessel was normal in size. The   respirophasic diameter changes were in the normal range (>= 50%),   consistent with normal central venous pressure.  Impressions:  - Compared to a prior study in 2017, the LVEF is higher at 60-65%.   There is again a severely dilated RA and moderately dilated and   mildly hypokinetic RV with septal flattening suggestive of   pressure overload of the RV, however, the degree of TR is mild   and RVSP only estimates at 22 mmHg. If pulmonary hypertension is   suspected, consider RHC for a definitive pulmonary artery   measurement.  LE DUPLEX  Preliminary findings: No evidence of deep vein thrombosis bilaterally, left sided baker's cyst noted.  Antimicrobials:  Anti-infectives (From admission, onward)   None     Subjective: Seen and examined and was wanting to know if she could have caffeine. No CP but had SOB on walking. No other concerns or complaints at this time.   Objective: Vitals:   11/12/17 1638 11/12/17 2001  11/13/17 0447 11/13/17 1322  BP: 131/90 121/83 (!) 136/91 124/87  Pulse: 84 83 72 83  Resp: 19 16 12 17   Temp: (!) 97.5 F (36.4 C) 97.6 F (36.4 C) 97.8 F (36.6 C) 97.9 F (36.6 C)  TempSrc: Axillary Oral Oral Oral  SpO2: 97% 93% 93% 98%  Weight: 65.8 kg (145 lb 1.6 oz)  65.5 kg (144 lb 4.8 oz)   Height: 5\' 9"  (1.753 m)       Intake/Output Summary (Last 24 hours) at 11/13/2017 1451 Last data filed at 11/13/2017 0400 Gross per 24 hour  Intake 1216.5 ml  Output -  Net 1216.5 ml   Filed Weights   11/11/17 2300 11/12/17 1638 11/13/17 0447  Weight: 69.4 kg (153 lb) 65.8 kg (145 lb 1.6 oz) 65.5 kg (144 lb 4.8 oz)   Examination: Physical Exam:  Constitutional: WN/WD Caucasian female NAD and appears calm and comfortable Eyes: Lids and conjunctivae normal, sclerae anicteric  ENMT: External Ears, Nose appear normal. Grossly normal hearing. Mucous membranes are moist. Neck: Appears normal, supple, no cervical masses, normal ROM, no appreciable thyromegaly; no JVD Respiratory: Diminished to auscultation bilaterally, no wheezing, rales, rhonchi or crackles. Normal respiratory effort and patient is not tachypenic.  Cardiovascular: RRR, no murmurs / rubs / gallops. S1 and S2 auscultated. No extremity edema.  Abdomen: Soft, non-tender, non-distended. No masses palpated. No appreciable hepatosplenomegaly. Bowel sounds positive.  GU: Deferred. Musculoskeletal: No clubbing /  cyanosis of digits/nails. No joint deformity upper and lower extremities.   Skin: No rashes, lesions, ulcers on a limited skin eval. No induration; Warm and dry.  Neurologic: CN 2-12 grossly intact with no focal deficits.  Romberg sign cerebellar reflexes not assessed.  Psychiatric: Normal judgment and insight. Alert and oriented x 3. Normal mood and appropriate affect.   Data Reviewed: I have personally reviewed following labs and imaging studies  CBC: Recent Labs  Lab 11/11/17 1617 11/12/17 0338 11/12/17 1000  11/13/17 0348  WBC 10.2 7.2 5.7 6.7  HGB 14.7 13.9 14.0 14.2  HCT 44.9 42.3 42.6 44.0  MCV 93.7 93.0 93.0 91.7  PLT 233 233 217 382   Basic Metabolic Panel: Recent Labs  Lab 11/11/17 1617 11/12/17 1000 11/13/17 0348  NA 135 137 139  K 4.4 4.1 3.8  CL 100* 102 102  CO2 27 27 28   GLUCOSE 108* 100* 94  BUN 19 17 12   CREATININE 1.13* 1.07* 0.85  CALCIUM 9.2 8.9 9.5  MG  --   --  2.1   GFR: Estimated Creatinine Clearance: 68.2 mL/min (by C-G formula based on SCr of 0.85 mg/dL). Liver Function Tests: Recent Labs  Lab 11/12/17 1000  AST 24  ALT 20  ALKPHOS 75  BILITOT 1.0  PROT 6.5  ALBUMIN 3.6   No results for input(s): LIPASE, AMYLASE in the last 168 hours. No results for input(s): AMMONIA in the last 168 hours. Coagulation Profile: Recent Labs  Lab 11/12/17 0041  INR 1.12   Cardiac Enzymes: No results for input(s): CKTOTAL, CKMB, CKMBINDEX, TROPONINI in the last 168 hours. BNP (last 3 results) No results for input(s): PROBNP in the last 8760 hours. HbA1C: No results for input(s): HGBA1C in the last 72 hours. CBG: No results for input(s): GLUCAP in the last 168 hours. Lipid Profile: No results for input(s): CHOL, HDL, LDLCALC, TRIG, CHOLHDL, LDLDIRECT in the last 72 hours. Thyroid Function Tests: No results for input(s): TSH, T4TOTAL, FREET4, T3FREE, THYROIDAB in the last 72 hours. Anemia Panel: No results for input(s): VITAMINB12, FOLATE, FERRITIN, TIBC, IRON, RETICCTPCT in the last 72 hours. Sepsis Labs: No results for input(s): PROCALCITON, LATICACIDVEN in the last 168 hours.  No results found for this or any previous visit (from the past 240 hour(s)).   Radiology Studies: Dg Chest 2 View  Result Date: 11/11/2017 CLINICAL DATA:  Shortness of breath after Christmas EXAM: CHEST  2 VIEW COMPARISON:  November 17, 2016 FINDINGS: The heart size and mediastinal contours are within normal limits. The lungs are hyperinflated. There is no focal infiltrate,  pulmonary edema, or pleural effusion. There is scoliosis of spine. IMPRESSION: No active cardiopulmonary disease. Electronically Signed   By: Abelardo Diesel M.D.   On: 11/11/2017 17:06   Ct Angio Chest Pe W And/or Wo Contrast  Result Date: 11/11/2017 CLINICAL DATA:  Increasing shortness of breath on exertion for several days. No chest pain. History of DVT and pulmonary embolus. Positive D-dimer. EXAM: CT ANGIOGRAPHY CHEST WITH CONTRAST TECHNIQUE: Multidetector CT imaging of the chest was performed using the standard protocol during bolus administration of intravenous contrast. Multiplanar CT image reconstructions and MIPs were obtained to evaluate the vascular anatomy. CONTRAST:  123mL ISOVUE-370 IOPAMIDOL (ISOVUE-370) INJECTION 76% COMPARISON:  11/17/2016 FINDINGS: Cardiovascular: Since previous study, there are new filling defects demonstrated in the segmental and subsegmental branches of upper and lower lobe pulmonary arteries bilaterally. This represents new development of acute on chronic pulmonary emboli. RV to LV ratio is 0.87, suggesting  no evidence of right heart strain. Mild diffuse cardiac enlargement. Dilated ascending thoracic aorta at 3.2 cm diameter. No pericardial effusion. Few scattered calcifications in the aorta. Mediastinum/Nodes: No enlarged mediastinal, hilar, or axillary lymph nodes. Thyroid gland, trachea, and esophagus demonstrate no significant findings. Lungs/Pleura: Lungs are clear. No pleural effusion or pneumothorax. Upper Abdomen: No acute abnormality. Musculoskeletal: No chest wall abnormality. No acute or significant osseous findings. Review of the MIP images confirms the above findings. IMPRESSION: 1. New acute pulmonary emboli demonstrated in segmental and subsegmental pulmonary artery branches bilaterally. No evidence of right heart strain. 2. Mild cardiac enlargement. 3. No evidence of active pulmonary disease. 4. Aortic atherosclerosis. Mild dilatation of the ascending  thoracic aorta. These results were called by telephone at the time of interpretation on 11/11/2017 at 11:27 pm to PA. ABIGAIL HARRIS , who verbally acknowledged these results. Aortic Atherosclerosis (ICD10-I70.0). Electronically Signed   By: Lucienne Capers M.D.   On: 11/11/2017 23:30   Scheduled Meds: . aspirin EC  81 mg Oral Daily  . cholecalciferol  2,000 Units Oral QHS  . [START ON 11/15/2017] levothyroxine  50 mcg Oral Once per day on Tue Sat  . levothyroxine  75 mcg Oral Once per day on Sun Mon Wed Thu Fri  . pilocarpine  5 mg Oral Q8H  . venlafaxine XR  75 mg Oral QHS   Continuous Infusions: . sodium chloride 100 mL/hr at 11/13/17 0636  . heparin 850 Units/hr (11/12/17 2006)    LOS: 2 days   Kerney Elbe, DO [facility] Pager [phone]  If 7PM-7AM, please contact night-coverage [email or web address] Password [email or web address] 11/13/2017, 2:51 PM

## 2017-11-13 NOTE — Progress Notes (Signed)
SATURATION QUALIFICATIONS: (This note is used to comply with regulatory documentation for home oxygen)  Patient Saturations on Room Air at Rest = 100  Patient Saturations on Room Air while Ambulating = 78  Patient Saturations on 2 Liters of oxygen while Ambulating = 100  Please briefly explain why patient needs home oxygen: Desat of 78%, complaints of feeling tired and drained while sats were low

## 2017-11-13 NOTE — Progress Notes (Signed)
*  PRELIMINARY RESULTS* Vascular Ultrasound Bilateral lower extremity venous duplex has been completed.  Preliminary findings: No evidence of deep vein thrombosis bilaterally, left sided baker's cyst noted.   Everrett Coombe 11/13/2017, 2:39 PM

## 2017-11-13 NOTE — Progress Notes (Signed)
ANTICOAGULATION CONSULT NOTE  Pharmacy Consult for heparin Indication: pulmonary embolus  No Known Allergies  Patient Measurements: Height: 5\' 9"  (175.3 cm) Weight: 144 lb 4.8 oz (65.5 kg) IBW/kg (Calculated) : 66.2  Vital Signs: Temp: 97.8 F (36.6 C) (01/03 0447) Temp Source: Oral (01/03 0447) BP: 136/91 (01/03 0447) Pulse Rate: 72 (01/03 0447)  Labs: Recent Labs    11/11/17 1617 11/12/17 0041 11/12/17 0338 11/12/17 0730 11/12/17 1000 11/12/17 1641 11/13/17 0348  HGB 14.7  --  13.9  --  14.0  --  14.2  HCT 44.9  --  42.3  --  42.6  --  44.0  PLT 233  --  233  --  217  --  243  LABPROT  --  14.3  --   --   --   --   --   INR  --  1.12  --   --   --   --   --   HEPARINUNFRC  --   --   --  1.10*  --  0.71* 0.52  CREATININE 1.13*  --   --   --  1.07*  --  0.85    Estimated Creatinine Clearance: 68.2 mL/min (by C-G formula based on SCr of 0.85 mg/dL).   Medical History: Past Medical History:  Diagnosis Date  . Anxiety   . Arthritis    "hands" (11/12/2017)  . Cataract    extraction surgery  . DVT (deep venous thrombosis) ([place]) 01/2016   LLE  . Fasting hyperglycemia    110 in 01/2011;131 in 05/2007  . Hypothyroidism   . Popliteal cyst   . Pulmonary embolism ([place]) 2017; 11/11/2017   "the one in 2017 was due to LLE DVT; don't why I had the one 11/11/2017"  . Renal insufficiency   . Tonsillar cancer ([place]) 1998    Assessment: 67yo female c/o worsening SOB w/ exertion,  heparin for PE, no R heart strain.  Of note pt had PE in 2017 tx'd w/ Xarelto, stopped after 69mo of treatment.   Follow up heparin level therapeutic at 0.52 after decrease to 850 units/hr, no bleeding issues noted.  Goal of Therapy:  Heparin level 0.3-0.7 units/ml Monitor platelets by anticoagulation protocol: Yes    Plan:  Continue heparin to 850 units/hour Recheck in am  Plan to transition DOAC   Thank you for allowing [place] to participate in this patients care.  Mariella Saa,  PharmD 11/13/2017 10:19 AM

## 2017-11-13 NOTE — Care Management Note (Addendum)
Case Management Note  Patient Details  Name: Danielle Pratt MRN: [id number] Date of Birth: [birth year]  Subjective/Objective:   Pulmonary Embolus               Action/Plan: Discharge Planning: Spoke to pt and made aware of copay for Eliquis or Xarelto. Pt will receive a 30 day free trial card for medication. Contacted [facility] and they do not have Xarelto starter pack (explained to pt that [facility] on [address] have X[facility]to Starter Pack). But they do have Xarelto 20 mg tabs available. They have Eliquis in stock. Will need oxygen at dc. Explained to pt Medicare coverage for oxygen and delivery of portable to room prior to dc and oxygen concentrator delivery to home. Will need orders for home oxygen.   Provided pt with Eliquis 30 day free trial card.   Benefit check  Co-pay for Xarelto 20mg . $45.00(30)  Co-pay for Eliquis 5mg  $45.00( 60 tab.)  No PA required.  Pt. May use any local pharmacy.    Expected Discharge Date:                  Expected Discharge Plan:  Home/Self Care  In-House Referral:  NA  Discharge planning Services  CM Consult  Post Acute Care Choice:  NA Choice offered to:  NA  DME Arranged:  N/A DME Agency:  NA  HH Arranged:  NA HH Agency:  NA  Status of Service:  Completed, signed off  If discussed at [place] of Stay Meetings, dates discussed:    Additional Comments:  Erenest Rasher, RN 11/13/2017, 11:23 AM

## 2017-11-14 ENCOUNTER — Encounter (HOSPITAL_COMMUNITY): Payer: PPO

## 2017-11-14 LAB — CBC WITH DIFFERENTIAL/PLATELET
Basophils Absolute: 0 10*3/uL (ref 0.0–0.1)
Basophils Relative: 1 %
Eosinophils Absolute: 0.2 10*3/uL (ref 0.0–0.7)
Eosinophils Relative: 5 %
HEMATOCRIT: 42.2 % (ref 36.0–46.0)
HEMOGLOBIN: 14 g/dL (ref 12.0–15.0)
LYMPHS ABS: 2 10*3/uL (ref 0.7–4.0)
LYMPHS PCT: 37 %
MCH: 30.6 pg (ref 26.0–34.0)
MCHC: 33.2 g/dL (ref 30.0–36.0)
MCV: 92.3 fL (ref 78.0–100.0)
MONO ABS: 0.3 10*3/uL (ref 0.1–1.0)
MONOS PCT: 6 %
NEUTROS ABS: 2.8 10*3/uL (ref 1.7–7.7)
NEUTROS PCT: 51 %
Platelets: 233 10*3/uL (ref 150–400)
RBC: 4.57 MIL/uL (ref 3.87–5.11)
RDW: 13.8 % (ref 11.5–15.5)
WBC: 5.3 10*3/uL (ref 4.0–10.5)

## 2017-11-14 LAB — COMPREHENSIVE METABOLIC PANEL
ALT: 17 U/L (ref 14–54)
AST: 22 U/L (ref 15–41)
Albumin: 3.7 g/dL (ref 3.5–5.0)
Alkaline Phosphatase: 75 U/L (ref 38–126)
Anion gap: 7 (ref 5–15)
BILIRUBIN TOTAL: 1.1 mg/dL (ref 0.3–1.2)
BUN: 10 mg/dL (ref 6–20)
CO2: 27 mmol/L (ref 22–32)
CREATININE: 0.89 mg/dL (ref 0.44–1.00)
Calcium: 9.1 mg/dL (ref 8.9–10.3)
Chloride: 102 mmol/L (ref 101–111)
Glucose, Bld: 87 mg/dL (ref 65–99)
POTASSIUM: 4 mmol/L (ref 3.5–5.1)
Sodium: 136 mmol/L (ref 135–145)
TOTAL PROTEIN: 6.5 g/dL (ref 6.5–8.1)

## 2017-11-14 LAB — PHOSPHORUS: PHOSPHORUS: 4.7 mg/dL — AB (ref 2.5–4.6)

## 2017-11-14 LAB — HEPARIN LEVEL (UNFRACTIONATED): Heparin Unfractionated: 0.57 IU/mL (ref 0.30–0.70)

## 2017-11-14 LAB — MAGNESIUM: MAGNESIUM: 2.1 mg/dL (ref 1.7–2.4)

## 2017-11-14 LAB — TROPONIN I: Troponin I: <0.03 ng/mL (ref <0.03)

## 2017-11-14 NOTE — Progress Notes (Signed)
ANTICOAGULATION CONSULT NOTE  Pharmacy Consult for heparin Indication: pulmonary embolus  No Known Allergies  Patient Measurements: Height: 5\' 9"  (175.3 cm) Weight: 145 lb 9.6 oz (66 kg) IBW/kg (Calculated) : 66.2  Vital Signs: Temp: 97.8 F (36.6 C) (01/04 0520) Temp Source: Oral (01/04 0520) BP: 128/86 (01/04 0520) Pulse Rate: 87 (01/04 0520)  Labs: Recent Labs    11/12/17 0041  11/12/17 1000 11/12/17 1641 11/13/17 0348 11/14/17 0348 11/14/17 0933  HGB  --    < > 14.0  --  14.2 14.0  --   HCT  --    < > 42.6  --  44.0 42.2  --   PLT  --    < > 217  --  243 233  --   LABPROT 14.3  --   --   --   --   --   --   INR 1.12  --   --   --   --   --   --   HEPARINUNFRC  --    < >  --  0.71* 0.52  --  0.57  CREATININE  --   --  1.07*  --  0.85 0.89  --    < > = values in this interval not displayed.    Estimated Creatinine Clearance: 65.7 mL/min (by C-G formula based on SCr of 0.89 mg/dL).   Medical History: Past Medical History:  Diagnosis Date  . Anxiety   . Arthritis    "hands" (11/12/2017)  . Cataract    extraction surgery  . DVT (deep venous thrombosis) ([place]) 01/2016   LLE  . Fasting hyperglycemia    110 in 01/2011;131 in 05/2007  . Hypothyroidism   . Popliteal cyst   . Pulmonary embolism ([place]) 2017; 11/11/2017   "the one in 2017 was due to LLE DVT; don't why I had the one 11/11/2017"  . Renal insufficiency   . 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 Of note pt had PE in 2017 tx'd w/ Xarelto, stopped after 45mo of treatment.   Heparin level therapeutic at 0.57 and  no bleeding issues noted.  Goal of Therapy:  Heparin level 0.3-0.7 units/ml Monitor platelets by anticoagulation protocol: Yes    Plan:  Continue heparin to 850 units/hour Daily heparin level, CBC, bleeding Plan to transition Eliquis on 1/5  Thank you for  allowing [place] to participate in this patients care.  Mariella Saa, PharmD 11/14/2017 11:31 AM

## 2017-11-14 NOTE — Consult Note (Signed)
Chief Complaint: PE  Referring Physician:Dr. Raiford Noble  Supervising Physician: Markus Daft  Patient Status: [facility] [place] [facility] - In-pt  HPI: Danielle Pratt is a 67 y.o. female with a history of tonsillar cancer who was found to have a PE in March 2017.  She was placed on Xarelto for 6 months and then this was ceased.  About a week ago she started having some fatigue and shortness of breath with exertion.  She presented to the ED for evaluation she had a CTA that revealed bilateral PE with but with no real heart strain.  She was admitted and started on heparin.  She has been on this for 3 full days.  She is having to remain on 1-2L of [place] due to feeling SOB and dropping into the low 90s.  A repeat CTA was completed last night to make sure her clot burden had not enlarged given the persistent mild SOB.  This CTA was stable.  She did have an echo which suggested a severely dilated RA and moderately dilated hypokinetic RV suggestive of pressure overload.  IR has been asked to see the patient to determine if she is a candidate for a PE lysis.    Past Medical History:  Past Medical History:  Diagnosis Date  . Anxiety   . Arthritis    "hands" (11/12/2017)  . Cataract    extraction surgery  . DVT (deep venous thrombosis) ([place]) 01/2016   LLE  . Fasting hyperglycemia    110 in 01/2011;131 in 05/2007  . Hypothyroidism   . Popliteal cyst   . Pulmonary embolism ([place]) 2017; 11/11/2017   "the one in 2017 was due to LLE DVT; don't why I had the one 11/11/2017"  . Renal insufficiency   . 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 CESAREAN SECTION  1984   G 1 P 2  . COLONOSCOPY  2003   negative; Dr Henrene Pastor  . JOINT REPLACEMENT    . LYMPHADENECTOMY Right 1998   neck  . TONSILLECTOMY AND ADENOIDECTOMY Right 1998   R tonsilar malignancy; subsequent LN dissection negative  . TOTAL ABDOMINAL HYSTERECTOMY  1980s   TOTAL ABDOMINAL HYSTERECTOMY W/  BILATERAL SALPINGOOPHORECTOMY [SHX83]; fibroids; Dr. Nori Riis  . TOTAL HIP ARTHROPLASTY Bilateral 2007   Dr Maureen Ralphs, both hips 3 months apart    Family History:  Family History  Problem Relation Age of Onset  . Hyperlipidemia Mother   . Hypertension Mother   . Ovarian cancer Mother   . Aneurysm Mother        ascending & descending aorta  . Heart attack Father 86  . Leukemia Paternal Grandmother   . Lung cancer Paternal Grandfather   . Diabetes Neg Hx   . Stroke Neg Hx   . Colon cancer Neg Hx   . Stomach cancer Neg Hx   . Rectal cancer Neg Hx     Social History:  reports that  has never smoked. she has never used smokeless tobacco. She reports that she drinks about 1.8 oz of alcohol per week. She reports that she does not use drugs.  Allergies: No Known Allergies  Medications: Medications reviewed in epic  Please HPI for pertinent positives, otherwise complete 10 system ROS negative.    Physical Exam: BP 128/86 (BP Location: Left Arm)   Pulse 87   Temp 97.8 F (36.6 C) (Oral)   Resp 16   Ht '5\' 9"'  (1.753 m)  Wt 145 lb 9.6 oz (66 kg)   SpO2 98%   BMI 21.50 kg/m  Body mass index is 21.5 kg/m. General: pleasant, WD, WN white female who is laying in bed in NAD HEENT: head is normocephalic, atraumatic.  Sclera are noninjected.  PERRL.  Ears and nose without any masses or lesions.  Mouth is pink and moist Heart: regular, rate, and rhythm.  Normal s1,s2. No obvious murmurs, gallops, or rubs noted.  Palpable radial pulses bilaterally Lungs: CTAB, no wheezes, rhonchi, or rales noted.  Respiratory effort nonlabored.  On 2L of [place] sating 97-100% Abd: soft, NT, ND, +BS, no masses, hernias, or organomegaly Psych: A&Ox3 with an appropriate affect.   Labs: Results for orders placed or performed during the hospital encounter of 11/11/17 (from the past 48 hour(s))  Heparin level (unfractionated)     Status: Abnormal   Collection Time: 11/12/17  4:41 PM  Result Value Ref Range    Heparin Unfractionated 0.71 (H) 0.30 - 0.70 IU/mL    Comment:        IF HEPARIN RESULTS ARE BELOW EXPECTED VALUES, AND PATIENT DOSAGE HAS BEEN CONFIRMED, SUGGEST FOLLOW UP TESTING OF ANTITHROMBIN III LEVELS.   Heparin level (unfractionated)     Status: None   Collection Time: 11/13/17  3:48 AM  Result Value Ref Range   Heparin Unfractionated 0.52 0.30 - 0.70 IU/mL    Comment:        IF HEPARIN RESULTS ARE BELOW EXPECTED VALUES, AND PATIENT DOSAGE HAS BEEN CONFIRMED, SUGGEST FOLLOW UP TESTING OF ANTITHROMBIN III LEVELS.   CBC     Status: None   Collection Time: 11/13/17  3:48 AM  Result Value Ref Range   WBC 6.7 4.0 - 10.5 K/uL   RBC 4.80 3.87 - 5.11 MIL/uL   Hemoglobin 14.2 12.0 - 15.0 g/dL   HCT 44.0 36.0 - 46.0 %   MCV 91.7 78.0 - 100.0 fL   MCH 29.6 26.0 - 34.0 pg   MCHC 32.3 30.0 - 36.0 g/dL   RDW 13.6 11.5 - 15.5 %   Platelets 243 150 - 400 K/uL  Basic metabolic panel     Status: None   Collection Time: 11/13/17  3:48 AM  Result Value Ref Range   Sodium 139 135 - 145 mmol/L   Potassium 3.8 3.5 - 5.1 mmol/L   Chloride 102 101 - 111 mmol/L   CO2 28 22 - 32 mmol/L   Glucose, Bld 94 65 - 99 mg/dL   BUN 12 6 - 20 mg/dL   Creatinine, Ser 0.85 0.44 - 1.00 mg/dL   Calcium 9.5 8.9 - 10.3 mg/dL   GFR calc non Af Amer >60 >60 mL/min   GFR calc Af Amer >60 >60 mL/min    Comment: (NOTE) The eGFR has been calculated using the CKD EPI equation. This calculation has not been validated in all clinical situations. eGFR's persistently <60 mL/min signify possible Chronic Kidney Disease.    Anion gap 9 5 - 15  Magnesium     Status: None   Collection Time: 11/13/17  3:48 AM  Result Value Ref Range   Magnesium 2.1 1.7 - 2.4 mg/dL  CBC with Differential/Platelet     Status: None   Collection Time: 11/14/17  3:48 AM  Result Value Ref Range   WBC 5.3 4.0 - 10.5 K/uL   RBC 4.57 3.87 - 5.11 MIL/uL   Hemoglobin 14.0 12.0 - 15.0 g/dL   HCT 42.2 36.0 - 46.0 %   MCV  92.3 78.0 -  100.0 fL   MCH 30.6 26.0 - 34.0 pg   MCHC 33.2 30.0 - 36.0 g/dL   RDW 13.8 11.5 - 15.5 %   Platelets 233 150 - 400 K/uL   Neutrophils Relative % 51 %   Neutro Abs 2.8 1.7 - 7.7 K/uL   Lymphocytes Relative 37 %   Lymphs Abs 2.0 0.7 - 4.0 K/uL   Monocytes Relative 6 %   Monocytes Absolute 0.3 0.1 - 1.0 K/uL   Eosinophils Relative 5 %   Eosinophils Absolute 0.2 0.0 - 0.7 K/uL   Basophils Relative 1 %   Basophils Absolute 0.0 0.0 - 0.1 K/uL  Comprehensive metabolic panel     Status: None   Collection Time: 11/14/17  3:48 AM  Result Value Ref Range   Sodium 136 135 - 145 mmol/L   Potassium 4.0 3.5 - 5.1 mmol/L   Chloride 102 101 - 111 mmol/L   CO2 27 22 - 32 mmol/L   Glucose, Bld 87 65 - 99 mg/dL   BUN 10 6 - 20 mg/dL   Creatinine, Ser 0.89 0.44 - 1.00 mg/dL   Calcium 9.1 8.9 - 10.3 mg/dL   Total Protein 6.5 6.5 - 8.1 g/dL   Albumin 3.7 3.5 - 5.0 g/dL   AST 22 15 - 41 U/L   ALT 17 14 - 54 U/L   Alkaline Phosphatase 75 38 - 126 U/L   Total Bilirubin 1.1 0.3 - 1.2 mg/dL   GFR calc non Af Amer >60 >60 mL/min   GFR calc Af Amer >60 >60 mL/min    Comment: (NOTE) The eGFR has been calculated using the CKD EPI equation. This calculation has not been validated in all clinical situations. eGFR's persistently <60 mL/min signify possible Chronic Kidney Disease.    Anion gap 7 5 - 15  Magnesium     Status: None   Collection Time: 11/14/17  3:48 AM  Result Value Ref Range   Magnesium 2.1 1.7 - 2.4 mg/dL  Phosphorus     Status: Abnormal   Collection Time: 11/14/17  3:48 AM  Result Value Ref Range   Phosphorus 4.7 (H) 2.5 - 4.6 mg/dL  Heparin level (unfractionated)     Status: None   Collection Time: 11/14/17  9:33 AM  Result Value Ref Range   Heparin Unfractionated 0.57 0.30 - 0.70 IU/mL    Comment:        IF HEPARIN RESULTS ARE BELOW EXPECTED VALUES, AND PATIENT DOSAGE HAS BEEN CONFIRMED, SUGGEST FOLLOW UP TESTING OF ANTITHROMBIN III LEVELS.     Imaging: Ct Angio Chest Pe  W Or Wo Contrast  Result Date: 11/13/2017 CLINICAL DATA:  Known pulmonary embolism. Possible increasing clot burden EXAM: CT ANGIOGRAPHY CHEST WITH CONTRAST TECHNIQUE: Multidetector CT imaging of the chest was performed using the standard protocol during bolus administration of intravenous contrast. Multiplanar CT image reconstructions and MIPs were obtained to evaluate the vascular anatomy. CONTRAST:  151m ISOVUE-370 IOPAMIDOL (ISOVUE-370) INJECTION 76% COMPARISON:  11/11/2017 FINDINGS: Cardiovascular: The thoracic aorta is incompletely evaluated due to the timing of the contrast bolus. The pulmonary artery demonstrates a normal branching pattern. There are again multiple filling defects identified bilaterally. The overall amount of clot burden is stable when compared with the prior exam. The RV/ LV ratio however is now greater than 1 indicating a degree of right heart strain. There is also some reflux into the hepatic veins which has increased in the interval from the prior exam consistent with  increased central venous pressure. No coronary calcifications are noted. Mediastinum/Nodes: Thoracic inlet is within normal limits. No hilar or mediastinal adenopathy is noted. The esophagus is within normal limits. Lungs/Pleura: Lungs are well aerated bilaterally without focal infiltrate or sizable effusion. No pneumothorax is noted. Upper Abdomen: Visualized upper abdomen is within normal limits. Musculoskeletal: No chest wall abnormality. No acute or significant osseous findings. Review of the MIP images confirms the above findings. IMPRESSION: Stable appearing pulmonary emboli bilaterally within the pulmonary artery although there are now changes consistent with right heart strain. This is new from the prior exam. The remainder of the study is stable. Electronically Signed   By: Inez Catalina M.D.   On: 11/13/2017 19:29   Dg Chest Port 1 View  Result Date: 11/13/2017 CLINICAL DATA:  Dyspnea EXAM: PORTABLE CHEST 1  VIEW COMPARISON:  CXR 11/11/2017 FINDINGS: The heart size and mediastinal contours are within normal limits. Mild hyperinflation of the lungs without pneumonic consolidation, effusion or pneumothorax. The visualized skeletal structures are unremarkable. IMPRESSION: No active disease.  Mild hyperinflation of the lungs. Electronically Signed   By: Ashley Royalty M.D.   On: 11/13/2017 21:50    Assessment/Plan 1. Bilateral pulmonary emboli  The patient's CTAs both show a very small clot burden.  However, she does have echo evidence of a severely dilated RA.  Given her clot burden is not terribly significant, it is difficult to say that her right heart strain is all acute from this event and could then ultimately her persistent SOB be all acute or from some chronic underlying issue.  Based off of her CTA, the patient would likely NOT benefit from a PE lysis as she still has enough good flow in her pulmonary arteries etc, that a lysis would like not improve her current symptoms.  Dr. Anselm Pancoast has evaluated the patient with me and has recommended either a cardio or pulmonary evaluation or both given her findings, inpatient or even outpatient if ultimately stable to be discharged.    Thank you for this interesting consult.  I greatly enjoyed meeting Danielle Pratt and look forward to participating in their care.  A copy of this report was sent to the requesting provider on this date.  Electronically Signed: Henreitta Cea 11/14/2017, 11:33 AM   I spent a total of 40 Minutes    in face to face in clinical consultation, greater than 50% of which was counseling/coordinating care for bilateral pulmonary emboli

## 2017-11-14 NOTE — Progress Notes (Addendum)
PROGRESS NOTE    Shyteria Lewis  XAJ:[id number] DOB: Apr 17, 1951 DOA: 11/11/2017 PCP: Binnie Rail, MD   Brief Narrative:  Glennie Bose is a 67 y.o. female with medical history significant of tonsillar cancer in 2000 as well as pulmonary embolism in March 2017 who has completed 6 months of anticoagulation treatment with Xarelto along with other comorbids. Patient presented with progressive exertional dyspnea and cough. Also some chest discomfort. She was seen in the ER and evaluated and patient found to have bilateral subsegmental pulmonary emboli. Patient has not had any recent travel but stated she did go to [place] earlier this year. No prolonged immobilization. Her first pulmonary embolus was triggered by immobilization after sustaining a foot fracture. This time around she has been active. She is not a smoker and has not been on any home on oral replacement therapy. Patient denied any known family history of thromboembolism. She denied any hemoptysis. Chest pain is with deep inspiration. No fever or chills. Patient was admitted with recurrent pulmonary embolus as she had a repeat CT angiogram of the chest in January of 2018 that showed resolution of her previous pulmonary embolus. ECHO and LE Duplex was done yesterday and patient ambulated. She was hypoxic Room Air by ambulation and after discussion with IR, will repeat CTA was repeated and now showed evidence of Right Heart Strain. Informally discussed Case with Pulmonary Dr. Chase Caller who recommended keeping the patient on Heparin gtt for 3-5 days.  Assessment & Plan:   Principal Problem:   Pulmonary emboli (HCC) Active Problems:   Malignant neoplasm of tonsillar fossa (HCC)   Hypothyroidism   Anxiety   Pulmonary embolism (HCC)   Acute respiratory failure with hypoxia (HCC)   Hyperphosphatemia  Acute Respiratory Failure with Hypoxia 2/2 Acute PE -Patient desaturated when Ambulating on Room Air again  -Will need 2 Liters of O2 at D/C    -Discussed case with Dr. Pearline Cables of PCCM who recommended continuing Anticoagulation and Discussing case with IR for further Recc's -Discussed Case with IR and recommended repeating CTA to evaluate if clot burden has gotten worse -Repeated CTA of Chest and showed  Stable appearing pulmonary emboli bilaterally within the pulmonary artery although there are now changes consistent with right heart strain. This is new from the prior exam. The remainder of the study is stable. -Repeated Walk Screen and still needs at least 2 Liters of O2 Ambulating -IR Formally consulted and  -Informally discussed case with Dr. Chase Caller of Pulmonary who recommended obtaining PESI Score and keeping patient on Heparin gtt for 3-5 Days with Outpatient Pulmonary Follow Up -Appointment made with Dr. Melvyn Novas on 12/03/16 at 4:00 pm  Recurrent PE -Unclear etiology -CTA 11/11/17 showed New acute pulmonary emboli demonstrated in segmental and subsegmental pulmonary artery branches bilaterally. No evidence of right heart strain.  Mild cardiac enlargement. No evidence of active pulmonary disease.  Aortic atherosclerosis. Mild dilatation of the ascending thoracic -C/w Heparin gtt and transition to NOAC Eliquis in AM -ECHO showed Compared to a prior study in 2017, the LVEF is higher at 60-65%. There is again a severely dilated RA and moderately dilated and mildly hypokinetic RV with septal flattening suggestive of pressure overload of the RV, however, the degree of TR is mild and RVSP only estimates at 22 mmHg. If pulmonary hypertension is suspected, consider RHC for a definitive pulmonary artery measurement. -LE Duplex showed No evidence of deep vein thrombosis bilaterally, left sided baker's cyst noted. -After Discussion with IR and Pulmonary will repeat CTA; -  Repeat CTA of Chest and showed Stable appearing pulmonary emboli bilaterally within the pulmonary artery although there are now changes consistent with right heart strain. This is  new from the prior exam. The remainder of the study is stable. -Informally discussed case with Dr. Chase Caller of Pulmonary who recommended obtaining PESI Score and keeping patient on Heparin gtt for 3-5 Days with Outpatient Pulmonary Follow Up -PESI Score was 3 -Repeat Troponin was <0.03 -Likely able to transition to NOAC tomorrow as patient will have been on Heparin gtt for at least 3 Days and possible d/c tomorrow. -Will need Hematology/Oncology follow up for Hypercoagulable workup  CKDII -Cr stable at baseline -Patient's BUN/Cr went from 19/1.13 -> 17/1.07 -> 12/0.85 ->10/0.89 -Avoid Nephrotoxins if possible -Will D/C IVF Rehydration with 1/2 NS at 75 mL/hr -Repeat CMP in AM   Hypothyroidism -C/w Levothyroxine 50 mcg po Tues/Sat and with 75 mcg Sun/Mon/Wed/Th/Fri  Hx of Tonsillar Cancer  -S/p surgical resection and XRT for tonsillar cancer in  2000 -She denies concerns of recurrent, no recent weight loss. -She is advised to follow up with Hematology/Oncology for this -C/w Pilocarpine 5 mg po q8h for Dry Mouth caused by XRT and 5 mg po Daily PRN  Hyperphosphatemia -Patient's Phos Level was 4.7 -Continue to Monitor and Repeat Phos Level in Am  DVT prophylaxis: Anticoagulated with Heparin gtt Code Status: FULL CODE Family Communication: No family present at bedside Disposition Plan: D/C Home in AM  Consultants:   Discussed Case with PCCM Dr. Mila Palmer   Discussed Case with Interventional Radiology Dr. Anselm Pancoast   Procedures:  ECHOCARDIOGRAM ------------------------------------------------------------------- Study Conclusions  - Left ventricle: The cavity size was normal. Wall thickness was   increased in a pattern of mild LVH. Systolic function was normal.   The estimated ejection fraction was in the range of 60% to 65%.   The study is not technically sufficient to allow evaluation of LV   diastolic function. - Ventricular septum: Septal motion showed paradox. The  contour   showed diastolic flattening and systolic flattening. - Aortic valve: Trileaflet. Sclerosis without stenosis. There was   trivial regurgitation. - Mitral valve: Mildly thickened leaflets . There was trivial   regurgitation. - Left atrium: The atrium was mildly dilated. - Right ventricle: Moderately dilated. - Right atrium: Severely dilated. - Tricuspid valve: There was trivial regurgitation. - Pulmonary arteries: PA peak pressure: 22 mm Hg (S). - Inferior vena cava: The vessel was normal in size. The   respirophasic diameter changes were in the normal range (>= 50%),   consistent with normal central venous pressure.  Impressions:  - Compared to a prior study in 2017, the LVEF is higher at 60-65%.   There is again a severely dilated RA and moderately dilated and   mildly hypokinetic RV with septal flattening suggestive of   pressure overload of the RV, however, the degree of TR is mild   and RVSP only estimates at 22 mmHg. If pulmonary hypertension is   suspected, consider RHC for a definitive pulmonary artery   measurement.  LE DUPLEX  Preliminary findings: No evidence of deep vein thrombosis bilaterally, left sided baker's cyst noted.  Antimicrobials:  Anti-infectives (From admission, onward)   None     Subjective: Seen and examined and was feeling ok today. No CP. Felt short of Breath when Ambulating. No other complaints or concerns at this time.   Objective: Vitals:   11/13/17 1922 11/14/17 0157 11/14/17 0520 11/14/17 1332  BP: (!) 137/96  128/86 131/78  Pulse: 81 70 87 78  Resp: 18  16   Temp: (!) 97.5 F (36.4 C)  97.8 F (36.6 C) 97.6 F (36.4 C)  TempSrc: Oral  Oral Oral  SpO2: 95%  98% 96%  Weight:   66 kg (145 lb 9.6 oz)   Height:        Intake/Output Summary (Last 24 hours) at 11/14/2017 1531 Last data filed at 11/14/2017 1300 Gross per 24 hour  Intake 440 ml  Output -  Net 440 ml   Filed Weights   11/12/17 1638 11/13/17 0447 11/14/17 0520   Weight: 65.8 kg (145 lb 1.6 oz) 65.5 kg (144 lb 4.8 oz) 66 kg (145 lb 9.6 oz)   Examination: Physical Exam:  Constitutional: WN/WD Caucasian female in NAD Eyes: Sclerae anicteric. Lids normal. ENMT: External ears and nose appear normal. MMM Neck: Supple with no JVD Respiratory: Diminished to auscultation but no appreciable wheezing/rales/rhonchi. Patient was not tachypenic but was wearing supplemental O2  Cardiovascular: RRR; No appreciable m/r/g. No appreciable LE edema Abdomen: Soft, NT, ND. Bowel sounds present GU: Deferred Musculoskeletal: No contractures; no cyanosis Skin: No rashes or lesions on a limited skin eval Neurologic: CN 2-12 grossly intact. No appreciable focal deficits Psychiatric: Normal mood and affect. Intact judgment and insight  Data Reviewed: I have personally reviewed following labs and imaging studies  CBC: Recent Labs  Lab 11/11/17 1617 11/12/17 0338 11/12/17 1000 11/13/17 0348 11/14/17 0348  WBC 10.2 7.2 5.7 6.7 5.3  NEUTROABS  --   --   --   --  2.8  HGB 14.7 13.9 14.0 14.2 14.0  HCT 44.9 42.3 42.6 44.0 42.2  MCV 93.7 93.0 93.0 91.7 92.3  PLT 233 233 217 243 970   Basic Metabolic Panel: Recent Labs  Lab 11/11/17 1617 11/12/17 1000 11/13/17 0348 11/14/17 0348  NA 135 137 139 136  K 4.4 4.1 3.8 4.0  CL 100* 102 102 102  CO2 27 27 28 27   GLUCOSE 108* 100* 94 87  BUN 19 17 12 10   CREATININE 1.13* 1.07* 0.85 0.89  CALCIUM 9.2 8.9 9.5 9.1  MG  --   --  2.1 2.1  PHOS  --   --   --  4.7*   GFR: Estimated Creatinine Clearance: 65.7 mL/min (by C-G formula based on SCr of 0.89 mg/dL). Liver Function Tests: Recent Labs  Lab 11/12/17 1000 11/14/17 0348  AST 24 22  ALT 20 17  ALKPHOS 75 75  BILITOT 1.0 1.1  PROT 6.5 6.5  ALBUMIN 3.6 3.7   No results for input(s): LIPASE, AMYLASE in the last 168 hours. No results for input(s): AMMONIA in the last 168 hours. Coagulation Profile: Recent Labs  Lab 11/12/17 0041  INR 1.12   Cardiac  Enzymes: Recent Labs  Lab 11/14/17 1147  TROPONINI <0.03   BNP (last 3 results) No results for input(s): PROBNP in the last 8760 hours. HbA1C: No results for input(s): HGBA1C in the last 72 hours. CBG: No results for input(s): GLUCAP in the last 168 hours. Lipid Profile: No results for input(s): CHOL, HDL, LDLCALC, TRIG, CHOLHDL, LDLDIRECT in the last 72 hours. Thyroid Function Tests: No results for input(s): TSH, T4TOTAL, FREET4, T3FREE, THYROIDAB in the last 72 hours. Anemia Panel: No results for input(s): VITAMINB12, FOLATE, FERRITIN, TIBC, IRON, RETICCTPCT in the last 72 hours. Sepsis Labs: No results for input(s): PROCALCITON, LATICACIDVEN in the last 168 hours.  No results found for this or any previous visit (from the past  240 hour(s)).   Radiology Studies: Ct Angio Chest Pe W Or Wo Contrast  Result Date: 11/13/2017 CLINICAL DATA:  Known pulmonary embolism. Possible increasing clot burden EXAM: CT ANGIOGRAPHY CHEST WITH CONTRAST TECHNIQUE: Multidetector CT imaging of the chest was performed using the standard protocol during bolus administration of intravenous contrast. Multiplanar CT image reconstructions and MIPs were obtained to evaluate the vascular anatomy. CONTRAST:  198mL ISOVUE-370 IOPAMIDOL (ISOVUE-370) INJECTION 76% COMPARISON:  11/11/2017 FINDINGS: Cardiovascular: The thoracic aorta is incompletely evaluated due to the timing of the contrast bolus. The pulmonary artery demonstrates a normal branching pattern. There are again multiple filling defects identified bilaterally. The overall amount of clot burden is stable when compared with the prior exam. The RV/ LV ratio however is now greater than 1 indicating a degree of right heart strain. There is also some reflux into the hepatic veins which has increased in the interval from the prior exam consistent with increased central venous pressure. No coronary calcifications are noted. Mediastinum/Nodes: Thoracic inlet is within  normal limits. No hilar or mediastinal adenopathy is noted. The esophagus is within normal limits. Lungs/Pleura: Lungs are well aerated bilaterally without focal infiltrate or sizable effusion. No pneumothorax is noted. Upper Abdomen: Visualized upper abdomen is within normal limits. Musculoskeletal: No chest wall abnormality. No acute or significant osseous findings. Review of the MIP images confirms the above findings. IMPRESSION: Stable appearing pulmonary emboli bilaterally within the pulmonary artery although there are now changes consistent with right heart strain. This is new from the prior exam. The remainder of the study is stable. Electronically Signed   By: Inez Catalina M.D.   On: 11/13/2017 19:29   Dg Chest Port 1 View  Result Date: 11/13/2017 CLINICAL DATA:  Dyspnea EXAM: PORTABLE CHEST 1 VIEW COMPARISON:  CXR 11/11/2017 FINDINGS: The heart size and mediastinal contours are within normal limits. Mild hyperinflation of the lungs without pneumonic consolidation, effusion or pneumothorax. The visualized skeletal structures are unremarkable. IMPRESSION: No active disease.  Mild hyperinflation of the lungs. Electronically Signed   By: Ashley Royalty M.D.   On: 11/13/2017 21:50   Scheduled Meds: . aspirin EC  81 mg Oral Daily  . cholecalciferol  2,000 Units Oral QHS  . [START ON 11/15/2017] levothyroxine  50 mcg Oral Once per day on Tue Sat  . levothyroxine  75 mcg Oral Once per day on Sun Mon Wed Thu Fri  . pilocarpine  5 mg Oral Q8H  . venlafaxine XR  75 mg Oral QHS   Continuous Infusions: . heparin 850 Units/hr (11/14/17 0535)    LOS: 3 days   Kerney Elbe, DO [facility] Pager [phone]  If 7PM-7AM, please contact night-coverage [email or web address] Password [email or web address] 11/14/2017, 3:31 PM

## 2017-11-14 NOTE — Progress Notes (Signed)
SATURATION QUALIFICATIONS: (This note is used to comply with regulatory documentation for home oxygen)  Patient Saturations on Room Air at Rest = 90-93  Patient Saturations on Room Air while Ambulating = 85  Patient Saturations on 1 Liters of oxygen while Ambulating = 85-91  Patient Saturations on 2 Liters of oxygen while Ambulating= 90-96  Please briefly explain why patient needs home oxygen:

## 2017-11-14 NOTE — Care Management Important Message (Signed)
Important Message  Patient Details  Name: Danielle Pratt MRN: [id number] Date of Birth: [birth year]   Medicare Important Message Given:  Yes    Erenest Rasher, RN 11/14/2017, 10:10 AM

## 2017-11-15 LAB — CBC WITH DIFFERENTIAL/PLATELET
BASOS PCT: 1 %
Basophils Absolute: 0 10*3/uL (ref 0.0–0.1)
EOS ABS: 0.3 10*3/uL (ref 0.0–0.7)
Eosinophils Relative: 5 %
HCT: 42.8 % (ref 36.0–46.0)
HEMOGLOBIN: 13.8 g/dL (ref 12.0–15.0)
Lymphocytes Relative: 31 %
Lymphs Abs: 1.8 10*3/uL (ref 0.7–4.0)
MCH: 29.8 pg (ref 26.0–34.0)
MCHC: 32.2 g/dL (ref 30.0–36.0)
MCV: 92.4 fL (ref 78.0–100.0)
MONOS PCT: 8 %
Monocytes Absolute: 0.5 10*3/uL (ref 0.1–1.0)
NEUTROS PCT: 55 %
Neutro Abs: 3.2 10*3/uL (ref 1.7–7.7)
Platelets: 234 10*3/uL (ref 150–400)
RBC: 4.63 MIL/uL (ref 3.87–5.11)
RDW: 13.8 % (ref 11.5–15.5)
WBC: 5.8 10*3/uL (ref 4.0–10.5)

## 2017-11-15 LAB — COMPREHENSIVE METABOLIC PANEL
ALBUMIN: 3.7 g/dL (ref 3.5–5.0)
ALK PHOS: 75 U/L (ref 38–126)
ALT: 20 U/L (ref 14–54)
ANION GAP: 9 (ref 5–15)
AST: 26 U/L (ref 15–41)
BUN: 16 mg/dL (ref 6–20)
CHLORIDE: 101 mmol/L (ref 101–111)
CO2: 29 mmol/L (ref 22–32)
Calcium: 9.3 mg/dL (ref 8.9–10.3)
Creatinine, Ser: 0.91 mg/dL (ref 0.44–1.00)
GFR calc Af Amer: >60 mL/min (ref >60)
GFR calc non Af Amer: >60 mL/min (ref >60)
GLUCOSE: 91 mg/dL (ref 65–99)
Potassium: 4.5 mmol/L (ref 3.5–5.1)
SODIUM: 139 mmol/L (ref 135–145)
Total Bilirubin: 0.7 mg/dL (ref 0.3–1.2)
Total Protein: 6.7 g/dL (ref 6.5–8.1)

## 2017-11-15 LAB — HEPARIN LEVEL (UNFRACTIONATED): Heparin Unfractionated: 0.49 IU/mL (ref 0.30–0.70)

## 2017-11-15 LAB — MAGNESIUM: Magnesium: 2.3 mg/dL (ref 1.7–2.4)

## 2017-11-15 LAB — PHOSPHORUS: Phosphorus: 4.8 mg/dL — ABNORMAL HIGH (ref 2.5–4.6)

## 2017-11-15 MED ORDER — APIXABAN 5 MG PO TABS
10.0000 mg | ORAL_TABLET | Freq: Two times a day (BID) | ORAL | Status: DC
  Administered 2017-11-15: 10 mg via ORAL
  Filled 2017-11-15: qty 2

## 2017-11-15 MED ORDER — APIXABAN 5 MG PO TABS: ORAL_TABLET | ORAL | 0 refills | Status: DC

## 2017-11-15 MED ORDER — APIXABAN 5 MG PO TABS
5.0000 mg | ORAL_TABLET | Freq: Two times a day (BID) | ORAL | Status: DC
Start: 2017-11-22 — End: 2017-11-15

## 2017-11-15 NOTE — Progress Notes (Addendum)
ANTICOAGULATION CONSULT NOTE  Pharmacy Consult for apixaban Indication: pulmonary embolus  No Known Allergies  Patient Measurements: Height: 5\' 9"  (175.3 cm) Weight: 145 lb 11.2 oz (66.1 kg) IBW/kg (Calculated) : 66.2  Vital Signs: Temp: 98 F (36.7 C) (01/05 0503) Temp Source: Oral (01/05 0503) BP: 120/74 (01/05 0503) Pulse Rate: 71 (01/05 0503)  Labs: Recent Labs    11/13/17 0348 11/14/17 0348 11/14/17 0933 11/14/17 1147 11/15/17 0317  HGB 14.2 14.0  --   --  13.8  HCT 44.0 42.2  --   --  42.8  PLT 243 233  --   --  234  HEPARINUNFRC 0.52  --  0.57  --  0.49  CREATININE 0.85 0.89  --   --  0.91  TROPONINI  --   --   --  <0.03  --     Estimated Creatinine Clearance: 64.3 mL/min (by C-G formula based on SCr of 0.91 mg/dL).   Medical History: Past Medical History:  Diagnosis Date  . Anxiety   . Arthritis    "hands" (11/12/2017)  . Cataract    extraction surgery  . DVT (deep venous thrombosis) ([place]) 01/2016   LLE  . Fasting hyperglycemia    110 in 01/2011;131 in 05/2007  . Hypothyroidism   . Popliteal cyst   . Pulmonary embolism ([place]) 2017; 11/11/2017   "the one in 2017 was due to LLE DVT; don't why I had the one 11/11/2017"  . Renal insufficiency   . Tonsillar cancer ([place]) 1998    Assessment: 67yo female c/o worsening SOB w/ exertion and heparin for PE, repeat CT 1/3 shows changes consistent with right heart strain. Of note pt had PE in 2017 tx'd w/ Xarelto, stopped after 16mo of treatment.   CBC wnl, no bleeding reported.   Goal of Therapy:  Heparin level 0.3-0.7 units/ml Monitor platelets by anticoagulation protocol: Yes    Plan:  Discontinue heparin IV Start Apixaban 10 mg BID x 7 days, then 5 mg BID x 6 months Monitor for s/sx of bleeding  Leroy Libman, PharmD Pharmacy Resident Pager: [phone]

## 2017-11-15 NOTE — Progress Notes (Signed)
Contacted [employer] to deliver portable oxygen to room and concentrator to her home. Jonnie Finner RN CCM Case Mgmt phone [phone]

## 2017-11-15 NOTE — Care Management (Deleted)
Pt presented with DME choice.  Danielle Pratt with [facility] advised that pt will require DME oxygen.

## 2017-11-15 NOTE — Discharge Summary (Signed)
Physician Discharge Summary  Danielle Pratt BMW:[id number] DOB: [birth year] DOA: 11/11/2017  PCP: Binnie Rail, MD  Admit date: 11/11/2017 Discharge date: 11/15/2017  Admitted From: Home Disposition:  Home  Recommendations for Outpatient Follow-up:  1. Follow up with PCP in 1-2 weeks 2. Follow up with Pulmonary Dr. Melvyn Novas on 12/03/64 for evaluation of Elevated Right Heart Pressures and RV Strain likely from PE along with Hypoxic Respiratory Failure 3. Follow up with Hematology/Oncology for Hypercoaguable workup and for Follow up of Tonsillar Cancer; Discussed with Dr. Burr Medico and will have Office schedule a follow up   4. Please obtain CMP/CBC, Mag, Phos in one week  Home Health: No Equipment/Devices: O2 via Nasal Cannula    Discharge Condition: Stable  CODE STATUS: FULL CODE Diet recommendation: Heart Healthy Diet   Brief/Interim Summary: Danielle Pratt a 67 y.o.femalewith medical history significant oftonsillar cancer in 2000 as well as pulmonary embolism in March 2017 who has completed 6 months of anticoagulation treatment withXarelto along with other comorbids.Patient presented with progressive exertional dyspnea and cough. Also some chest discomfort. She was seen in the ER and evaluated and patient found to have bilateral subsegmental pulmonary emboli. Patient has not had any recent travel but stated she did go to [place] earlier this year. No prolonged immobilization. Her first pulmonary embolus was triggered by immobilization after sustaining a foot fracture. This time around she has been active. She is not a smoker and has not been on any home on oral replacement therapy. Patient denied any known family history of thromboembolism. She denied any hemoptysis. Chest pain is with deep inspiration. No fever or chills.   Patient was admitted and found to have a recurrent pulmonary embolus as she had a repeat CT angiogram of the chest in January of 2018 that showed resolution of her previous  pulmonary embolus. ECHO and LE Duplex was done as below and patient ambulated and she was hypoxic Room Air by ambulation. After discussion with IR, will repeat CTA. It was repeated and now showed evidence of Right Heart Strain consistent with ECHO. Informally discussed Case with Pulmonary Dr. Chase Caller who recommended keeping the patient on Heparin gtt for 3-5 days. Patient remained on Heparin 3 days and was hemodynamically stable however still required O2. She was transitioned to po Eliquis and deemed medically stable to D/C home with O2 and follow up with PCP, Pulmonary, and Hematology/Oncology as an outpatient.   Discharge Diagnoses:  Principal Problem:   Pulmonary emboli (HCC) Active Problems:   Malignant neoplasm of tonsillar fossa (HCC)   Hypothyroidism   Anxiety   Pulmonary embolism (HCC)   Acute respiratory failure with hypoxia (HCC)   Hyperphosphatemia  Acute Respiratory Failure with Hypoxia 2/2 Acute PE -Patient desaturated when Ambulating on Room Air again  -Will need 2 Liters of O2 at D/C  -Discussed case with Dr. Pearline Cables of PCCM who recommended continuing Anticoagulation and Discussing case with IR for further Recc's -Discussed Case with IR and recommended repeating CTA to evaluate if clot burden has gotten worse -Repeated CTA of Chest and showed  Stable appearing pulmonary emboli bilaterally within the pulmonary artery although there are now changes consistent with right heart strain. This is new from the prior exam. The remainder of the study is stable. -Repeated Walk Screen and still needs at least 2 Liters of O2 Ambulating -IR Formally consulted and  -Informally discussed case with Dr. Chase Caller of Pulmonary who recommended obtaining PESI Score and keeping patient on Heparin gtt for 3-5 Days with Outpatient  Pulmonary Follow Up -Appointment made with Dr. Melvyn Novas on 12/03/16 at 4:00 pm -Will have Home O2 at D/C  Recurrent PE -Unclear etiology -CTA 11/11/17 showed New acute  pulmonary emboli demonstrated in segmental and subsegmental pulmonary artery branches bilaterally. No evidence of right heart strain.  Mild cardiac enlargement. No evidence of active pulmonary disease.  Aortic atherosclerosis. Mild dilatation of the ascending thoracic -C/w Heparin gtt and transition to NOAC Eliquis in AM -ECHO showed Compared to a prior study in 2017, the LVEF is higher at 60-65%. There is again a severely dilated RA and moderately dilated and mildly hypokinetic RV with septal flattening suggestive ofpressure overload of the RV, however, the degree of TR is mild and RVSP only estimates at 22 mmHg. If pulmonary hypertension is suspected, consider RHC for a definitive pulmonary artery measurement. -LE Duplex showed No evidence of deep vein thrombosisbilaterally, left sidedbaker's cyst noted. -After Discussion with IR and Pulmonary will repeat CTA; -Repeat CTA of Chest and showed Stable appearing pulmonary emboli bilaterally within the pulmonary artery although there are now changes consistent with right heart strain. This is new from the prior exam. The remainder of the study is stable. -Informally discussed case with Dr. Chase Caller of Pulmonary who recommended obtaining PESI Score and keeping patient on Heparin gtt for 3-5 Days with Outpatient Pulmonary Follow Up; Appointment scheduled with Dr. Melvyn Novas for 12/03/16 at 4:00 pm -PESI Score was 3 -Repeat Troponin was <0.03 -Transitioned to NOAC today as patient will have been on Heparin gtt for at least 3 Days and will need Apixaban 10 mg po BID x 7 days then 5 mg po BID long term  -Will need Hematology/Oncology follow up for Hypercoagulable workup; Discussed with Dr. Burr Medico and she will get her office to schedule an outpatient follow up   CKDII -Cr stable at baseline -Patient's BUN/Cr went from 19/1.13 -> 17/1.07 -> 12/0.85 ->10/0.89 -> 16/0.91 -Avoid Nephrotoxins if possible -D/C IVF Rehydration with 1/2 NS at 75 mL/hr -Repeat CMP as an  out patient   Hypothyroidism -C/w Home  Levothyroxine 50 mcg po Tues/Sat and with 75 mcg Sun/Mon/Wed/Th/Fri -Repeat TSH and Free T4 at PCP/s office  Hx of Tonsillar Cancer -S/p surgical resection and XRT fortonsillar cancerin2000 -She denies concerns of recurrent, no recent weight loss. -She is advised to follow up with Hematology/Oncology for this -C/w Pilocarpine 5 mg po q8h for Dry Mouth caused by XRT and 5 mg po Daily PRN -Discussed with Dr. Burr Medico of Oncology and she will have her office schedule patient for a follow up   Hyperphosphatemia -Patient's Phos Level was 4.8 -Continue to Monitor and Repeat Phos Level in Am  Discharge Instructions  Discharge Instructions    Call MD for:  difficulty breathing, headache or visual disturbances   Complete by:  As directed    Call MD for:  extreme fatigue   Complete by:  As directed    Call MD for:  hives   Complete by:  As directed    Call MD for:  persistant dizziness or light-headedness   Complete by:  As directed    Call MD for:  persistant nausea and vomiting   Complete by:  As directed    Call MD for:  redness, tenderness, or signs of infection (pain, swelling, redness, odor or green/yellow discharge around incision site)   Complete by:  As directed    Call MD for:  severe uncontrolled pain   Complete by:  As directed    Call MD for:  temperature >100.4   Complete by:  As directed    Diet - low sodium heart healthy   Complete by:  As directed    Discharge instructions   Complete by:  As directed    Follow up with PCP, Pulmonology, and Oncology as an outpatient. Take all medications as prescribed. If symptoms change or worsen please return to the ED for evaluation.   Increase activity slowly   Complete by:  As directed      Allergies as of 11/15/2017   No Known Allergies     Medication List    TAKE these medications   apixaban 5 MG Tabs tablet Commonly known as:  ELIQUIS Take 10 mg (two 5 tablets) po BID x 7  days and then 5 mg po BID from then onward   aspirin EC 81 MG tablet Take 1 tablet (81 mg total) by mouth daily. What changed:  when to take this   clobetasol cream 0.05 % Commonly known as:  TEMOVATE Apply 1 application topically at bedtime as needed (skin irritation/ finger).   levothyroxine 50 MCG tablet Commonly known as:  SYNTHROID, LEVOTHROID Take 1.5 tablets by mouth on Tues and Sat, Then 1 tablet on other days -- Office visit needed for further refills What changed:    how much to take  how to take this  when to take this  additional instructions   pilocarpine 5 MG tablet Commonly known as:  SALAGEN Take 5 mg by mouth See admin instructions. Take 1 tablet (5 mg) by mouth 3 times daily - early morning, midday and bedtime, may take an additional tablet during the day as needed for dry mouth.   venlafaxine XR 75 MG 24 hr capsule Commonly known as:  EFFEXOR-XR TAKE ONE CAPSULE BY MOUTH AT BEDTIME What changed:    how much to take  how to take this  when to take this   Vitamin D 2000 units tablet Take 2,000 Units by mouth at bedtime.            Durable Medical Equipment  (From admission, onward)        Start     Ordered   11/15/17 1253  DME Oxygen  Once    Question Answer Comment  Mode or (Route) Nasal cannula   Liters per Minute 2   Frequency Continuous (stationary and portable oxygen unit needed)   Oxygen conserving device Yes   Oxygen delivery system Gas      11/15/17 1253   11/15/17 1159  For home use only DME oxygen  Once    Comments:  RT please evaluate for light weight portable oxygen (POC)- freestyle  Question Answer Comment  Mode or (Route) Nasal cannula   Liters per Minute 2   Frequency Continuous (stationary and portable oxygen unit needed)   Oxygen delivery system Gas      11/15/17 1159     Follow-up Information    Binnie Rail, MD Follow up in 1 week(s).   Specialty:  Internal Medicine Why:  hospital discharge follow up   pmd to refer to hematology /oncology for recurrent PE, h/o cancer. Contact information: [place] [address] [phone]        Tanda Rockers, MD Follow up on 12/03/2017.   Specialty:  Pulmonary Disease Why:  Follow Up for Hospital Visit at 4:00 pm. Please arrive half an hour early to fill out paper work. Contact information: [address] [place] [address] [phone]  [employer] Follow up.   Why:  will deliver portable to room prior to discharge and oxygen concentrator to your home. Contact information: [place] [address] [phone]        Truitt Merle, MD Follow up.   Specialties:  Hematology, Oncology Why:  Follow up for Hypercoaguable workup and follow up for Tonsillar Cancer; Office will call to schedule an appointment  Contact information: [place] [address] [phone]          No Known Allergies  Consultations:  Discussed Case with PCCM Dr. Mila Palmer   Interventional Radiology Dr. Anselm Pancoast   Procedures/Studies: Dg Chest 2 View  Result Date: 11/11/2017 CLINICAL DATA:  Shortness of breath after Christmas EXAM: CHEST  2 VIEW COMPARISON:  November 17, 2016 FINDINGS: The heart size and mediastinal contours are within normal limits. The lungs are hyperinflated. There is no focal infiltrate, pulmonary edema, or pleural effusion. There is scoliosis of spine. IMPRESSION: No active cardiopulmonary disease. Electronically Signed   By: Abelardo Diesel M.D.   On: 11/11/2017 17:06   Ct Angio Chest Pe W Or Wo Contrast  Result Date: 11/13/2017 CLINICAL DATA:  Known pulmonary embolism. Possible increasing clot burden EXAM: CT ANGIOGRAPHY CHEST WITH CONTRAST TECHNIQUE: Multidetector CT imaging of the chest was performed using the standard protocol during bolus administration of intravenous contrast. Multiplanar CT image reconstructions and MIPs were obtained to evaluate the vascular  anatomy. CONTRAST:  180mL ISOVUE-370 IOPAMIDOL (ISOVUE-370) INJECTION 76% COMPARISON:  11/11/2017 FINDINGS: Cardiovascular: The thoracic aorta is incompletely evaluated due to the timing of the contrast bolus. The pulmonary artery demonstrates a normal branching pattern. There are again multiple filling defects identified bilaterally. The overall amount of clot burden is stable when compared with the prior exam. The RV/ LV ratio however is now greater than 1 indicating a degree of right heart strain. There is also some reflux into the hepatic veins which has increased in the interval from the prior exam consistent with increased central venous pressure. No coronary calcifications are noted. Mediastinum/Nodes: Thoracic inlet is within normal limits. No hilar or mediastinal adenopathy is noted. The esophagus is within normal limits. Lungs/Pleura: Lungs are well aerated bilaterally without focal infiltrate or sizable effusion. No pneumothorax is noted. Upper Abdomen: Visualized upper abdomen is within normal limits. Musculoskeletal: No chest wall abnormality. No acute or significant osseous findings. Review of the MIP images confirms the above findings. IMPRESSION: Stable appearing pulmonary emboli bilaterally within the pulmonary artery although there are now changes consistent with right heart strain. This is new from the prior exam. The remainder of the study is stable. Electronically Signed   By: Inez Catalina M.D.   On: 11/13/2017 19:29   Ct Angio Chest Pe W And/or Wo Contrast  Result Date: 11/11/2017 CLINICAL DATA:  Increasing shortness of breath on exertion for several days. No chest pain. History of DVT and pulmonary embolus. Positive D-dimer. EXAM: CT ANGIOGRAPHY CHEST WITH CONTRAST TECHNIQUE: Multidetector CT imaging of the chest was performed using the standard protocol during bolus administration of intravenous contrast. Multiplanar CT image reconstructions and MIPs were obtained to evaluate the vascular  anatomy. CONTRAST:  128mL ISOVUE-370 IOPAMIDOL (ISOVUE-370) INJECTION 76% COMPARISON:  11/17/2016 FINDINGS: Cardiovascular: Since previous study, there are new filling defects demonstrated in the segmental and subsegmental branches of upper and lower lobe pulmonary arteries bilaterally. This represents new development of acute on chronic pulmonary emboli. RV to LV ratio is 0.87, suggesting no evidence  of right heart strain. Mild diffuse cardiac enlargement. Dilated ascending thoracic aorta at 3.2 cm diameter. No pericardial effusion. Few scattered calcifications in the aorta. Mediastinum/Nodes: No enlarged mediastinal, hilar, or axillary lymph nodes. Thyroid gland, trachea, and esophagus demonstrate no significant findings. Lungs/Pleura: Lungs are clear. No pleural effusion or pneumothorax. Upper Abdomen: No acute abnormality. Musculoskeletal: No chest wall abnormality. No acute or significant osseous findings. Review of the MIP images confirms the above findings. IMPRESSION: 1. New acute pulmonary emboli demonstrated in segmental and subsegmental pulmonary artery branches bilaterally. No evidence of right heart strain. 2. Mild cardiac enlargement. 3. No evidence of active pulmonary disease. 4. Aortic atherosclerosis. Mild dilatation of the ascending thoracic aorta. These results were called by telephone at the time of interpretation on 11/11/2017 at 11:27 pm to PA. ABIGAIL HARRIS , who verbally acknowledged these results. Aortic Atherosclerosis (ICD10-I70.0). Electronically Signed   By: Lucienne Capers M.D.   On: 11/11/2017 23:30   Dg Chest Port 1 View  Result Date: 11/13/2017 CLINICAL DATA:  Dyspnea EXAM: PORTABLE CHEST 1 VIEW COMPARISON:  CXR 11/11/2017 FINDINGS: The heart size and mediastinal contours are within normal limits. Mild hyperinflation of the lungs without pneumonic consolidation, effusion or pneumothorax. The visualized skeletal structures are unremarkable. IMPRESSION: No active disease.  Mild  hyperinflation of the lungs. Electronically Signed   By: Ashley Royalty M.D.   On: 11/13/2017 21:50   ECHOCARDIOGRAM ------------------------------------------------------------------- Study Conclusions  - Left ventricle: The cavity size was normal. Wall thickness was increased in a pattern of mild LVH. Systolic function was normal. The estimated ejection fraction was in the range of 60% to 65%. The study is not technically sufficient to allow evaluation of LV diastolic function. - Ventricular septum: Septal motion showed paradox. The contour showed diastolic flattening and systolic flattening. - Aortic valve: Trileaflet. Sclerosis without stenosis. There was trivial regurgitation. - Mitral valve: Mildly thickened leaflets . There was trivial regurgitation. - Left atrium: The atrium was mildly dilated. - Right ventricle: Moderately dilated. - Right atrium: Severely dilated. - Tricuspid valve: There was trivial regurgitation. - Pulmonary arteries: PA peak pressure: 22 mm Hg (S). - Inferior vena cava: The vessel was normal in size. The respirophasic diameter changes were in the normal range (>= 50%), consistent with normal central venous pressure.  Impressions:  - Compared to a prior study in 2017, the LVEF is higher at 60-65%. There is again a severely dilated RA and moderately dilated and mildly hypokinetic RV with septal flattening suggestive of pressure overload of the RV, however, the degree of TR is mild and RVSP only estimates at 22 mmHg. If pulmonary hypertension is suspected, consider RHC for a definitive pulmonary artery measurement.  LE DUPLEX Preliminary findings: No evidence of deep vein thrombosisbilaterally, left sidedbaker's cyst noted.  Subjective: Seen and examined and had no issues. No nausea or vomiting. SOB stable. No other concerns and wanting to go home.  Discharge Exam: Vitals:   11/14/17 2029 11/15/17 0503  BP:  131/77 120/74  Pulse: 79 71  Resp: 16 16  Temp: 97.7 F (36.5 C) 98 F (36.7 C)  SpO2: 98% 93%   Vitals:   11/14/17 0520 11/14/17 1332 11/14/17 2029 11/15/17 0503  BP: 128/86 131/78 131/77 120/74  Pulse: 87 78 79 71  Resp: 16  16 16   Temp: 97.8 F (36.6 C) 97.6 F (36.4 C) 97.7 F (36.5 C) 98 F (36.7 C)  TempSrc: Oral Oral Oral Oral  SpO2: 98% 96% 98% 93%  Weight: 66 kg (  145 lb 9.6 oz)   66.1 kg (145 lb 11.2 oz)  Height:       General: Pt is alert, awake, not in acute distress Cardiovascular: RRR, S1/S2 +, no rubs, no gallops Respiratory: Diminished bilaterally, no wheezing, no rhonchi Abdominal: Soft, NT, ND, bowel sounds + Extremities: no edema, no cyanosis  The results of significant diagnostics from this hospitalization (including imaging, microbiology, ancillary and laboratory) are listed below for reference.    Microbiology: No results found for this or any previous visit (from the past 240 hour(s)).   Labs: BNP (last 3 results) Recent Labs    11/11/17 1617  BNP 366.4*   Basic Metabolic Panel: Recent Labs  Lab 11/11/17 1617 11/12/17 1000 11/13/17 0348 11/14/17 0348 11/15/17 0317  NA 135 137 139 136 139  K 4.4 4.1 3.8 4.0 4.5  CL 100* 102 102 102 101  CO2 27 27 28 27 29   GLUCOSE 108* 100* 94 87 91  BUN 19 17 12 10 16   CREATININE 1.13* 1.07* 0.85 0.89 0.91  CALCIUM 9.2 8.9 9.5 9.1 9.3  MG  --   --  2.1 2.1 2.3  PHOS  --   --   --  4.7* 4.8*   Liver Function Tests: Recent Labs  Lab 11/12/17 1000 11/14/17 0348 11/15/17 0317  AST 24 22 26   ALT 20 17 20   ALKPHOS 75 75 75  BILITOT 1.0 1.1 0.7  PROT 6.5 6.5 6.7  ALBUMIN 3.6 3.7 3.7   No results for input(s): LIPASE, AMYLASE in the last 168 hours. No results for input(s): AMMONIA in the last 168 hours. CBC: Recent Labs  Lab 11/12/17 0338 11/12/17 1000 11/13/17 0348 11/14/17 0348 11/15/17 0317  WBC 7.2 5.7 6.7 5.3 5.8  NEUTROABS  --   --   --  2.8 3.2  HGB 13.9 14.0 14.2 14.0 13.8   HCT 42.3 42.6 44.0 42.2 42.8  MCV 93.0 93.0 91.7 92.3 92.4  PLT 233 217 243 233 234   Cardiac Enzymes: Recent Labs  Lab 11/14/17 1147  TROPONINI <0.03   BNP: Invalid input(s): POCBNP CBG: No results for input(s): GLUCAP in the last 168 hours. D-Dimer No results for input(s): DDIMER in the last 72 hours. Hgb A1c No results for input(s): HGBA1C in the last 72 hours. Lipid Profile No results for input(s): CHOL, HDL, LDLCALC, TRIG, CHOLHDL, LDLDIRECT in the last 72 hours. Thyroid function studies No results for input(s): TSH, T4TOTAL, T3FREE, THYROIDAB in the last 72 hours.  Invalid input(s): FREET3 Anemia work up No results for input(s): VITAMINB12, FOLATE, FERRITIN, TIBC, IRON, RETICCTPCT in the last 72 hours. Urinalysis    Component Value Date/Time   COLORURINE YELLOW 07/09/2017 1428   APPEARANCEUR CLEAR 07/09/2017 1428   LABSPEC 1.010 07/09/2017 1428   PHURINE 7.0 07/09/2017 1428   GLUCOSEU NEGATIVE 07/09/2017 1428   HGBUR NEGATIVE 07/09/2017 1428   BILIRUBINUR NEGATIVE 07/09/2017 1428   KETONESUR NEGATIVE 07/09/2017 1428   PROTEINUR NEGATIVE 01/31/2016 2252   UROBILINOGEN 0.2 07/09/2017 1428   NITRITE NEGATIVE 07/09/2017 1428   LEUKOCYTESUR NEGATIVE 07/09/2017 1428   Sepsis Labs Invalid input(s): PROCALCITONIN,  WBC,  LACTICIDVEN Microbiology No results found for this or any previous visit (from the past 240 hour(s)).  Time coordinating discharge: 35 minutes  SIGNED:  Kerney Elbe, DO [facility] 11/15/2017, 12:54 PM Pager [phone]  If 7PM-7AM, please contact night-coverage [email or web address] Password [email or web address]

## 2017-11-17 ENCOUNTER — Telehealth: Payer: Self-pay | Admitting: *Deleted

## 2017-11-17 ENCOUNTER — Telehealth: Payer: Self-pay | Admitting: Hematology and Oncology

## 2017-11-17 NOTE — Telephone Encounter (Signed)
Called pt to verify appt that has been made for 11/20/17 which pt states she is aware of the appt. Also inform have some additional questions concerning hospital discharge. Completed TCM call below.../lmb  Transition Care Management Follow-up Telephone Call   Date discharged? 11/15/17   How have you been since you were released from the hospital? Pt states she is doing ok   Do you understand why you were in the hospital? YES   Do you understand the discharge instructions? YES   Where were you discharged to? Home   Items Reviewed:  Medications reviewed: YES  Allergies reviewed: YES  Dietary changes reviewed: YES, heart healthy  Referrals reviewed: YES, appt w/ dr. Melvyn Novas 12/03/17   Functional Questionnaire:   Activities of Daily Living (ADLs):   She states she are independent in the following: ambulation, bathing and hygiene, feeding, continence, grooming, toileting and dressing States she doesn't require assistance    Any transportation issues/concerns?: NO   Any patient concerns? NO   Confirmed importance and date/time of follow-up visits scheduled YES, appt 11/20/17  Provider Appointment booked with Dr. Quay Burow   Confirmed with patient if condition begins to worsen call PCP or go to the ER.  Patient was given the office number and encouraged to call back with question or concerns.  : YES

## 2017-11-17 NOTE — Telephone Encounter (Signed)
Per YF call received from [facility] for patient to be seen at [facility] for f/u of PE and hx of tonsil ca. Per YF schedule patient to see MP in 4-8 weeks. Spoke with patient re hosp f/u appointment wuth Dr. Lebron Conners 12/29/17 @ 2pm to arrive 1:30pm.

## 2017-11-19 NOTE — Progress Notes (Signed)
Subjective:    Patient ID: Danielle Pratt, female    DOB: 06-26-1951, 67 y.o.   MRN: [id number]  HPI The patient is here for follow up from the hospital.   Admitted 11/11/17-11/15/17.  Recommendations for Outpatient Follow-up:  1. Follow up with PCP in 1-2 weeks 2. Follow up with Pulmonary Dr. Melvyn Novas on 12/03/16 for evaluation of Elevated Right Heart Pressures and RV Strain likely from PE along with Hypoxic Respiratory Failure 3. Follow up with Hematology/Oncology for Hypercoaguable workup and for Follow up of Tonsillar Cancer; Discussed with Dr. Burr Medico and will have Office schedule a follow up   4. Please obtain CMP/CBC, Mag, Phos in one week  She has a history of tonsillar cancer in 2000 as well as a pulmonary embolism in March 2017.  She completed 6 months of anticoagulation with Xarelto.    She presented with progressive dsypnea, cough and chest discomfort.  She was found to have b/l subsegmental PE.  There has been no recent travel or immobilization.  There is no family history fo thromboembolism.  Chest pain was with deep inspiration.  No fever or chills. Echo and LE duplex was done.  She was found to have riht heart strain.  She was hypoxic on RA with ambulation.  She was started on heparin for 3-5 days and then started on eliquis.  She was discharged home on oxygen.    Acute respiratory failure with hypoxia, due to PE Desaturated with ambulation on RA Started on oxygen 2 L and discharged home on 2 L via [place], which she is using continuously Started on heparin and then eliquis, which she is taking daily Repeat CTA showed stable PE b/l - not progressing Positive right heart strain, new form first CT She has seen some improvement in her stamina and shortness of breath since being home from the hospital She sees Dr Melvyn Novas on 12/03/16  recurent PE B/l subsegmental and subsegmental PE Unclear etiology CT 11/2017 shows PE is new - repeat Ct did not show progression Initially on heparin ggt and  then placed on eliquis Echo with right heart strain - severely dilated RA, moderately dilated and mildly hypokinetic RV LE [place] - no DVT To see pulmonary this month To see oncology this month for hypercoagulability w/u  CKD, stable 2 Stable Received IVF Repeat cmp, magnesium, phosphorus, CBC  Hypothyroidism Repeat tsh recommended-ordered  No changes made in medication  H/o tonsillar cancer S/p surgical resection and radiation in 2000 No evidence of recurrence Will f/u with oncology On pilocarpine - continued  Hyperphosphatemia Phos level was 4.8  Recheck phos level    Medications and allergies reviewed with patient and updated if appropriate.  Patient Active Problem List   Diagnosis Date Noted  . Hyperphosphatemia 11/14/2017  . Acute respiratory failure with hypoxia ([place]) 11/13/2017  . Pulmonary embolism ([place]) 11/11/2017  . Closed fracture of sesamoid bone of foot 06/18/2017  . Postnasal drip 12/02/2016  . Hip dislocation, right ([place]) 02/20/2016  . DVT, RLE 01/31/16 02/20/2016  . Anxiety 02/20/2016  . Pulmonary emboli ([place]) 01/31/2016  . Multiple pigmented nevi 07/28/2015  . Unruptured popliteal cyst 09/07/2013  . HYPERLIPIDEMIA 01/01/2010  . LEFT BUNDLE BRANCH BLOCK 01/01/2010  . OSTEOARTHRITIS 01/01/2010  . Malignant neoplasm of tonsillar fossa ([place]) 12/27/2008  . Hypothyroidism 07/17/2007    Current Outpatient Medications on File Prior to Visit  Medication Sig Dispense Refill  . apixaban (ELIQUIS) 5 MG TABS tablet Take 10 mg (two 5 tablets) po BID x  7 days and then 5 mg po BID from then onward 88 tablet 0  . aspirin EC 81 MG tablet Take 1 tablet (81 mg total) by mouth daily. (Patient taking differently: Take 81 mg by mouth at bedtime. )    . Cholecalciferol (VITAMIN D) 2000 units tablet Take 2,000 Units by mouth at bedtime.    . clobetasol cream (TEMOVATE) 0.93 % Apply 1 application topically at bedtime as needed (skin irritation/ finger).    [name] levothyroxine  (SYNTHROID, LEVOTHROID) 50 MCG tablet Take 1.5 tablets by mouth on Tues and Sat, Then 1 tablet on other days -- Office visit needed for further refills (Patient taking differently: Take 50-75 mcg by mouth See admin instructions. Take 1 tablet (77mcg) by mouth on Tues and Sat, Then 1.5 tablets (79mcg) on other days.) 90 tablet 0  . pilocarpine (SALAGEN) 5 MG tablet Take 5 mg by mouth See admin instructions. Take 1 tablet (5 mg) by mouth 3 times daily - early morning, midday and bedtime, may take an additional tablet during the day as needed for dry mouth.    . venlafaxine XR (EFFEXOR-XR) 75 MG 24 hr capsule TAKE ONE CAPSULE BY MOUTH AT BEDTIME (Patient taking differently: TAKE ONE CAPSULE (75mg ) BY MOUTH AT BEDTIME) 90 capsule 0   No current facility-administered medications on file prior to visit.     Past Medical History:  Diagnosis Date  . Anxiety   . Arthritis    "hands" (11/12/2017)  . Cataract    extraction surgery  . DVT (deep venous thrombosis) ([place]) 01/2016   LLE  . Fasting hyperglycemia    110 in 01/2011;131 in 05/2007  . Hypothyroidism   . Popliteal cyst   . Pulmonary embolism ([place]) 2017; 11/11/2017   "the one in 2017 was due to LLE DVT; don't why I had the one 11/11/2017"  . Renal insufficiency   . Tonsillar cancer ([place]) 1998    Past Surgical History:  Procedure Laterality Date  . CESAREAN SECTION  1984   G 1 P 2  . COLONOSCOPY  2003   negative; Dr Henrene Pastor  . JOINT REPLACEMENT    . LYMPHADENECTOMY Right 1998   neck  . TONSILLECTOMY AND ADENOIDECTOMY Right 1998   R tonsilar malignancy; subsequent LN dissection negative  . TOTAL ABDOMINAL HYSTERECTOMY  1980s   TOTAL ABDOMINAL HYSTERECTOMY W/ BILATERAL SALPINGOOPHORECTOMY [SHX83]; fibroids; Dr. Nori Riis  . TOTAL HIP ARTHROPLASTY Bilateral 2007   Dr Maureen Ralphs, both hips 3 months apart    Social History   Socioeconomic History  . Marital status: Married    Spouse name: None  . Number of children: None  . Years of education:  None  . Highest education level: None  Social Needs  . Financial resource strain: None  . Food insecurity - worry: None  . Food insecurity - inability: None  . Transportation needs - medical: None  . Transportation needs - non-medical: None  Occupational History  . None  Tobacco Use  . Smoking status: Never Smoker  . Smokeless tobacco: Never Used  Substance and Sexual Activity  . Alcohol use: Yes    Alcohol/week: 1.8 oz    Types: 3 Cans of beer per week  . Drug use: No  . Sexual activity: Yes  Other Topics Concern  . None  Social History Narrative  . None    Family History  Problem Relation Age of Onset  . Hyperlipidemia Mother   . Hypertension Mother   . Ovarian cancer Mother   .  Aneurysm Mother        ascending & descending aorta  . Heart attack Father 40  . Leukemia Paternal Grandmother   . Lung cancer Paternal Grandfather   . Diabetes Neg Hx   . Stroke Neg Hx   . Colon cancer Neg Hx   . Stomach cancer Neg Hx   . Rectal cancer Neg Hx     Review of Systems  Constitutional: Negative for appetite change, chills and fever.  Respiratory: Positive for cough (minimal dry cough) and shortness of breath (with exertion ). Negative for wheezing.   Cardiovascular: Negative for chest pain, palpitations and leg swelling.  Gastrointestinal: Negative for abdominal pain, constipation, diarrhea and nausea.  Neurological: Negative for dizziness, light-headedness and headaches.  Psychiatric/Behavioral: Negative for sleep disturbance.       Objective:   Vitals:   11/20/17 1019  BP: 132/90  Pulse: 88  Resp: 16  SpO2: 98%   Wt Readings from Last 3 Encounters:  11/20/17 150 lb (68 kg)  11/15/17 145 lb 11.2 oz (66.1 kg)  08/21/17 153 lb (69.4 kg)   Body mass index is 22.15 kg/m.   Physical Exam    Constitutional: Appears well-developed and well-nourished. No distress.  HENT:  Head: Normocephalic and atraumatic.  Neck: Neck supple. No tracheal deviation present. No  thyromegaly present.  No cervical lymphadenopathy Cardiovascular: Normal rate, regular rhythm and normal heart sounds.   No murmur heard. No carotid bruit .  No edema Pulmonary/Chest: Effort normal and breath sounds normal. No respiratory distress. No has no wheezes. No rales.  Skin: Skin is warm and dry. Not diaphoretic.  Psychiatric: Normal mood and affect. Behavior is normal.      Assessment & Plan:    See Problem List for Assessment and Plan of chronic medical problems.

## 2017-11-20 ENCOUNTER — Ambulatory Visit (INDEPENDENT_AMBULATORY_CARE_PROVIDER_SITE_OTHER): Payer: PPO | Admitting: Internal Medicine

## 2017-11-20 ENCOUNTER — Other Ambulatory Visit (INDEPENDENT_AMBULATORY_CARE_PROVIDER_SITE_OTHER): Payer: PPO

## 2017-11-20 ENCOUNTER — Encounter: Payer: Self-pay | Admitting: Internal Medicine

## 2017-11-20 VITALS — BP 132/90 | HR 88 | Resp 16 | Wt 150.0 lb

## 2017-11-20 DIAGNOSIS — I2609 Other pulmonary embolism with acute cor pulmonale: Secondary | ICD-10-CM

## 2017-11-20 DIAGNOSIS — E039 Hypothyroidism, unspecified: Secondary | ICD-10-CM | POA: Diagnosis not present

## 2017-11-20 DIAGNOSIS — J9601 Acute respiratory failure with hypoxia: Secondary | ICD-10-CM | POA: Diagnosis not present

## 2017-11-20 LAB — COMPREHENSIVE METABOLIC PANEL
ALBUMIN: 4.5 g/dL (ref 3.5–5.2)
ALK PHOS: 81 U/L (ref 39–117)
ALT: 30 U/L (ref 0–35)
AST: 25 U/L (ref 0–37)
BUN: 26 mg/dL — AB (ref 6–23)
CALCIUM: 9.5 mg/dL (ref 8.4–10.5)
CO2: 32 mEq/L (ref 19–32)
CREATININE: 0.89 mg/dL (ref 0.40–1.20)
Chloride: 101 mEq/L (ref 96–112)
GFR: 67.57 mL/min (ref >60.00)
Glucose, Bld: 98 mg/dL (ref 70–99)
POTASSIUM: 4.7 meq/L (ref 3.5–5.1)
SODIUM: 139 meq/L (ref 135–145)
TOTAL PROTEIN: 7.5 g/dL (ref 6.0–8.3)
Total Bilirubin: 0.5 mg/dL (ref 0.2–1.2)

## 2017-11-20 LAB — TSH: TSH: 6.26 u[IU]/mL — ABNORMAL HIGH (ref 0.35–4.50)

## 2017-11-20 LAB — CBC WITH DIFFERENTIAL/PLATELET
BASOS PCT: 1.1 % (ref 0.0–3.0)
Basophils Absolute: 0.1 10*3/uL (ref 0.0–0.1)
EOS PCT: 4.7 % (ref 0.0–5.0)
Eosinophils Absolute: 0.3 10*3/uL (ref 0.0–0.7)
HEMATOCRIT: 44.3 % (ref 36.0–46.0)
HEMOGLOBIN: 14.7 g/dL (ref 12.0–15.0)
LYMPHS PCT: 25.9 % (ref 12.0–46.0)
Lymphs Abs: 1.4 10*3/uL (ref 0.7–4.0)
MCHC: 33.1 g/dL (ref 30.0–36.0)
MCV: 93.7 fl (ref 78.0–100.0)
MONO ABS: 0.4 10*3/uL (ref 0.1–1.0)
MONOS PCT: 8.1 % (ref 3.0–12.0)
Neutro Abs: 3.3 10*3/uL (ref 1.4–7.7)
Neutrophils Relative %: 60.2 % (ref 43.0–77.0)
Platelets: 259 10*3/uL (ref 150.0–400.0)
RBC: 4.73 Mil/uL (ref 3.87–5.11)
RDW: 14.2 % (ref 11.5–15.5)
WBC: 5.4 10*3/uL (ref 4.0–10.5)

## 2017-11-20 LAB — MAGNESIUM: MAGNESIUM: 2.2 mg/dL (ref 1.5–2.5)

## 2017-11-20 LAB — PHOSPHORUS: Phosphorus: 4.4 mg/dL (ref 2.3–4.6)

## 2017-11-20 MED ORDER — LEVOTHYROXINE SODIUM 50 MCG PO TABS: ORAL_TABLET | ORAL | 3 refills | Status: DC

## 2017-11-20 NOTE — Assessment & Plan Note (Signed)
11/13/17 due to PE, right heart strain On eliquis On oxygen 2 L  [place] Has f/u with pulmonary on 1/23 Small improvement in stamina/SOB

## 2017-11-20 NOTE — Assessment & Plan Note (Signed)
Recheck CMP, phosphorus, magnesium

## 2017-11-20 NOTE — Assessment & Plan Note (Signed)
Recently with recurrent PE-no obvious cause We will see oncology for hypercoagulable workup and to confirm no recurrence of tonsillar cancer With right heart strain and hypoxia On oxygen 2 L/min nasal cannula On Eliquis

## 2017-11-20 NOTE — Assessment & Plan Note (Signed)
Check tsh  Titrate med dose if needed  

## 2017-11-20 NOTE — Patient Instructions (Addendum)
  Test(s) ordered today. Your results will be released to MyChart (or called to you) after review, usually within 72hours after test completion. If any changes need to be made, you will be notified at that same time.    Medications reviewed and updated.  No changes recommended at this time.  Your prescription(s) have been submitted to your pharmacy. Please take as directed and contact our office if you believe you are having problem(s) with the medication(s).     

## 2017-11-21 ENCOUNTER — Other Ambulatory Visit: Payer: Self-pay | Admitting: Emergency Medicine

## 2017-11-21 DIAGNOSIS — E039 Hypothyroidism, unspecified: Secondary | ICD-10-CM

## 2017-11-21 MED ORDER — LEVOTHYROXINE SODIUM 50 MCG PO TABS: 75.0000 ug | ORAL_TABLET | Freq: Every day | ORAL | 0 refills | Status: DC

## 2017-12-03 ENCOUNTER — Encounter: Payer: Self-pay | Admitting: Internal Medicine

## 2017-12-03 ENCOUNTER — Ambulatory Visit (INDEPENDENT_AMBULATORY_CARE_PROVIDER_SITE_OTHER): Payer: PPO | Admitting: Internal Medicine

## 2017-12-03 VITALS — BP 114/80 | HR 84 | Ht 69.0 in | Wt 157.0 lb

## 2017-12-03 DIAGNOSIS — N182 Chronic kidney disease, stage 2 (mild): Secondary | ICD-10-CM | POA: Diagnosis not present

## 2017-12-03 DIAGNOSIS — E039 Hypothyroidism, unspecified: Secondary | ICD-10-CM

## 2017-12-03 DIAGNOSIS — I2699 Other pulmonary embolism without acute cor pulmonale: Secondary | ICD-10-CM | POA: Diagnosis not present

## 2017-12-03 DIAGNOSIS — Z85818 Personal history of malignant neoplasm of other sites of lip, oral cavity, and pharynx: Secondary | ICD-10-CM | POA: Diagnosis not present

## 2017-12-03 DIAGNOSIS — J9601 Acute respiratory failure with hypoxia: Secondary | ICD-10-CM | POA: Diagnosis not present

## 2017-12-03 NOTE — Progress Notes (Signed)
Subjective:     Patient ID: Danielle Pratt, female   DOB: 1951/02/05,   MRN: [id number]  HPI  66 yowf never smoker with h/o R tonsil ca 1997/ RT    12/03/2017 1st [facility] office visit/ Danielle Pratt   Chief Complaint  Patient presents with  . pulmonary consult    referred by Dr Ronney Asters home O2 2L uses [employer],no SOB,coughing,no CP,   while recovering from R Hip injury in March 2017 L leg clot /chest discomfort dx PE with Septal flattening on Echo  rx 6 m xarelto  s f/u  echo or venous dopplers and continued to note  doe x fast walk then end dec 2018 felt more lethargic / no energy / more sob ? Readmit::   Admit date: 11/11/2017 Discharge date: 11/15/2017  Admitted From: Home Disposition:  Home  Recommendations for Outpatient Follow-up:  1. Follow up with PCP in 1-2 weeks 2. Follow up with Pulmonary Dr. Melvyn Novas on 12/03/16 for evaluation of Elevated Right Heart Pressures and RV Strain likely from PE along with Hypoxic Respiratory Failure 3. Follow up with Hematology/Oncology for Hypercoaguable workup and for Follow up of Tonsillar Cancer; Discussed with Dr. Burr Medico and will have Office schedule a follow up   4. Please obtain CMP/CBC, Mag, Phos in one week  Home Health: No Equipment/Devices: O2 via Nasal Cannula    Discharge Condition: Stable  CODE STATUS: FULL CODE Diet recommendation: Heart Healthy Diet   Brief/Interim Summary: Danielle Pratt a 67 y.o.femalewith medical history significant oftonsillar cancer in 2000 as well as pulmonary embolism in March 2017 who has completed 6 months of anticoagulation treatment withXarelto along with other comorbids.Patient presented with progressive exertional dyspnea and cough. Also some chest discomfort. She was seen in the ER and evaluated and patient found to have bilateral subsegmental pulmonary emboli. Patient has not had any recent travel but stated she did go to [place] earlier this year. No prolonged immobilization. Her first  pulmonary embolus was triggered by immobilization after sustaining a foot fracture. This time around she has been active. She is not a smoker and has not been on any home on oral replacement therapy. Patient denied any known family history of thromboembolism. She denied any hemoptysis. Chest pain is with deep inspiration. No fever or chills.   Patient was admitted and found to have a recurrent pulmonary embolus as she had a repeat CT angiogram of the chest in January of 2018 that showed resolution of her previous pulmonary embolus. ECHO and LE Duplex was done as belowand patient ambulated and she was hypoxic Room Air by ambulation. After discussion with IR, will repeat CTA. Itwas repeated and now showed evidence of Right Heart Strain consistent with ECHO. Informally discussed Case with Pulmonary Dr. Chase Caller who recommended keeping the patient on Heparin gtt for 3-5 days. Patient remained on Heparin 3 days and was hemodynamically stable however still required O2. She was transitioned to po Eliquis and deemed medically stable to D/C home with O2 and follow up with PCP, Pulmonary, and Hematology/Oncology as an outpatient.   Discharge Diagnoses:  Principal Problem:   Pulmonary emboli (HCC) Active Problems:   Malignant neoplasm of tonsillar fossa ([facility])   Hypothyroidism   Anxiety   Pulmonary embolism ([facility])   Acute respiratory failure with hypoxia ([facility])   Hyperphosphatemia  Acute Respiratory Failure with Hypoxia 2/2 Acute PE -Patient desaturated when Ambulating on Room Airagain -Will need2 Liters ofO2 at D/C  -Discussed case with Dr. Pearline Cables of PCCM who recommended continuing Anticoagulation  and Discussing case with IR for further Recc's -Discussed Case with IR and recommended repeating CTA to evaluate if clot burden has gotten worse -Repeated CTA of Chest and showedStable appearing pulmonary emboli bilaterally within the pulmonary artery although there are now changes consistent with right  heart strain. This is new from the prior exam. The remainder of the study is stable. -Repeated Walk Screen and still needs at least 2 Liters of O2 Ambulating -IR Formally consulted and  -Informally discussed case with Dr. Chase Caller of Pulmonary who recommended obtaining PESI Score and keeping patient on Heparin gtt for 3-5 Days with Outpatient Pulmonary Follow Up -Appointment made with Dr. Melvyn Novas on 12/03/16 at 4:00 pm -Will have Home O2 at D/C  Recurrent PE -Unclear etiology -CTA 11/11/17 showed New acute pulmonary emboli demonstrated in segmental and subsegmental pulmonary artery branches bilaterally. No evidence of right heart strain. Mild cardiac enlargement. No evidence of active pulmonary disease. Aortic atherosclerosis. Mild dilatation of the ascending thoracic -C/w Heparin gtt and transition to NOAC Eliquis in AM -ECHO showed Compared to a prior study in 2017, the LVEF is higher at 60-65%. There is again a severely dilated RA and moderately dilated and mildly hypokinetic RV with septal flattening suggestive ofpressure overload of the RV, however, the degree of TR is mild and RVSP only estimates at 22 mmHg. If pulmonary hypertension is suspected, consider RHC for a definitive pulmonary artery measurement. -LE Duplex showed No evidence of deep vein thrombosisbilaterally, left sidedbaker's cyst noted. -After Discussion with IR and Pulmonary will repeat CTA; -Repeat CTA of Chest and showedStable appearing pulmonary emboli bilaterally within the pulmonary artery although there are now changes consistent with right heart strain. This is new from the prior exam. The remainder of the study is stable. -Informally discussed case with Dr. Chase Caller of Pulmonary who recommended obtaining PESI Score and keeping patient on Heparin gtt for 3-5 Days with Outpatient Pulmonary Follow Up; Appointment scheduled with Dr. Melvyn Novas for 12/03/16 at 4:00 pm -PESI Score was 3 -Repeat Troponin was <0.03 -Transitioned  to [facility] as patient will have been on Heparin gtt for at least 3 Daysand will need Apixaban 10 mg po BID x 7 days then 5 mg po BID long term  -Will need Hematology/Oncology follow up for Hypercoagulable workup; Discussed with Dr. Burr Medico and she will get her office to schedule an outpatient follow up   CKDII -Cr stable at baseline -Patient's BUN/Cr went from 19/1.13 -> 17/1.07 -> 12/0.85->10/0.89 -> 16/0.91 -Avoid Nephrotoxins if possible -D/CIVF Rehydration with 1/2 NS at 75 mL/hr -Repeat CMP as an out patient   Hypothyroidism -C/w Home  Levothyroxine 50 mcg po Tues/Sat and with 75 mcg Sun/Mon/Wed/Th/Fri -Repeat TSH and Free T4 at PCP/s office  Hx of Tonsillar Cancer -S/p surgical resection and XRT fortonsillar cancerin2000 -She denies concerns of recurrent, no recent weight loss. -She is advised to follow up withHematology/Oncology for this -C/w Pilocarpine 5 mg po q8h for Dry Mouth caused by XRT and 5 mg po Daily PRN -Discussed with Dr. Burr Medico of Oncology and she will have her office schedule patient for a follow up   Hyperphosphatemia -Patient's Phos Level was 4.8 -Continue to Monitor and Repeat Phos Level in Am   12/03/2017 1st [facility] office visit/ Danielle Pratt   Chief Complaint  Patient presents with  . pulmonary consult    referred by Dr Ronney Asters home O2 2L uses [employer],no SOB,coughing,no CP,   able to [employer] but not pushing herself at all at this point  Sleeping fine on 2lpm 24/7 and sats ok per her monitor at all times she checks them including activity.  No obvious day to day or daytime variability or assoc excess/ purulent sputum or mucus plugs or hemoptysis or cp or chest tightness, subjective wheeze or overt sinus or hb symptoms. No unusual exposure hx or h/o childhood pna/ asthma or knowledge of premature birth.  Sleeping ok flat without nocturnal  or early am exacerbation  of respiratory  c/o's or need for noct saba. Also denies any obvious  fluctuation of symptoms with weather or environmental changes or other aggravating or alleviating factors except as outlined above   Current Allergies, Complete Past Medical History, Past Surgical History, Family History, and Social History were reviewed in [employer] record.  ROS  The following are not active complaints unless bolded Hoarseness, sore throat, dysphagia, dental problems, itching, sneezing,  nasal congestion or discharge of excess mucus or purulent secretions, ear ache,   fever, chills, sweats, unintended wt loss or wt gain, classically pleuritic or exertional cp,  orthopnea pnd or leg swelling, presyncope, palpitations, abdominal pain, anorexia, nausea, vomiting, diarrhea  or change in bowel habits or change in bladder habits, change in stools or change in urine, dysuria, hematuria,  rash, arthralgias, visual complaints, headache, numbness, weakness or ataxia or problems with walking or coordination,  change in mood/affect or memory.        Current Meds  Medication Sig  . apixaban (ELIQUIS) 5 MG TABS tablet Take 10 mg (two 5 tablets) po BID x 7 days and then 5 mg po BID from then onward  . aspirin EC 81 MG tablet Take 1 tablet (81 mg total) by mouth daily. (Patient taking differently: Take 81 mg by mouth at bedtime. )  . Cholecalciferol (VITAMIN D) 2000 units tablet Take 2,000 Units by mouth at bedtime.  . clobetasol cream (TEMOVATE) 9.92 % Apply 1 application topically at bedtime as needed (skin irritation/ finger).  [name] levothyroxine (SYNTHROID, LEVOTHROID) 50 MCG tablet Take 1.5 tablets (75 mcg total) by mouth daily before breakfast.  . pilocarpine (SALAGEN) 5 MG tablet Take 5 mg by mouth See admin instructions. Take 1 tablet (5 mg) by mouth 3 times daily - early morning, midday and bedtime, may take an additional tablet during the day as needed for dry mouth.  . venlafaxine XR (EFFEXOR-XR) 75 MG 24 hr capsule TAKE ONE CAPSULE BY MOUTH AT BEDTIME (Patient  taking differently: TAKE ONE CAPSULE (75mg ) BY MOUTH AT BEDTIME)            Review of Systems     Objective:   Physical Exam  pleasant thin wf   Wt Readings from Last 3 Encounters:  12/03/17 157 lb (71.2 kg)  11/20/17 150 lb (68 kg)  11/15/17 145 lb 11.2 oz (66.1 kg)     Vital signs reviewed - Note on arrival 02 sats       HEENT: nl dentition, turbinates bilaterally, and oropharynx. Nl external ear canals without cough reflex   NECK :  without JVD/Nodes/TM/ nl carotid upstrokes bilaterally   LUNGS: no acc muscle use,  Nl contour chest which is clear to A and P bilaterally without cough on insp or exp maneuvers   CV:  RRR  no s3 or murmur - No increase in P2, and no edema   ABD:  soft and nontender with nl inspiratory excursion in the supine position. No bruits or organomegaly appreciated, bowel sounds nl  MS:  Nl gait/  ext warm without deformities, calf tenderness, cyanosis or clubbing No obvious joint restrictions   SKIN: warm and dry without lesions    NEURO:  alert, approp, nl sensorium with  no motor or cerebellar deficits apparent.         Assessment:

## 2017-12-03 NOTE — Patient Instructions (Addendum)
Goal for 02 saturation is over 90% at all times.  Please see patient coordinator before you leave today  to schedule echocardiogram in 3 weeks   Pulmonary follow up is as needed  - I will defer to Dr Burr Medico whether you need full strength or the lower strength of eliquis after 6 full months of present dose.

## 2017-12-04 ENCOUNTER — Telehealth (HOSPITAL_COMMUNITY): Payer: Self-pay | Admitting: Internal Medicine

## 2017-12-04 ENCOUNTER — Encounter: Payer: Self-pay | Admitting: Internal Medicine

## 2017-12-04 NOTE — Assessment & Plan Note (Addendum)
01/2216-  After being sedentary for two weeks, was on estradiol L  DVT and PE On xarelto x 6 months Recurrent PE 11/11/2017 -11/13/17 -LE Duplex showed No evidence of deep vein thrombosisbilaterally, left sidedbaker's cyst  Echo 11/13/17  - Compared to a prior study in 2017, the LVEF is higher at 60-65%.   There is again a severely dilated RA and moderately dilated and   mildly hypokinetic RV with septal flattening suggestive of   pressure overload of the RV, however, the degree of TR is mild   and RVSP only estimates at 22 mmHg. If pulmonary hypertension is   suspected, consider RHC for a definitive pulmonary artery   measurement.  - Echo should be repeated in mid feb 2019 and again at 6 months if not normalized   Of greatest concern here is that she has [place] on basis of past PE and never fully recovered based on persistent doe p last event but treatment for now should be directed at the acute event/ supplying adequate 02 and repeat serial echo's which should show gradual improvement and if not then V/Q scan not repeat CTa would be the next step  (though I bet we'll see normalization of echo)  In meantime seeing hematology for w/u for possible hypercoagulable state.  Discussed in detail all the  indications, usual  risks and alternatives  relative to the benefits with patient who agrees to proceed with conservative f/u as outlined    Total time devoted to counseling  > 50 % of initial 60 min office visit:  review case with pt/husband discussion of options/alternatives/ personally creating written customized instructions  in presence of pt  then going over those specific  Instructions directly with the pt including how to use all of the meds but in particular covering each new medication in detail and the difference between the maintenance= "automatic" meds and the prns using an action plan format for the latter (If this problem/symptom => do that organization reading Left to right).  Please see  AVS from this visit for a full list of these instructions which I personally wrote for this pt and  are unique to this visit.

## 2017-12-04 NOTE — Assessment & Plan Note (Signed)
Placed on 2lpm at d/c 11/15/17  Adequate control on present rx, reviewed in detail with pt > no change in rx needed  > reviewed goal of keeping sats > 90% at all times

## 2017-12-05 NOTE — Telephone Encounter (Signed)
User: Cherie Dark A Date/time: 12/04/17 8:34 AM  Comment: Called pt and lmsg for her to CB to sch echo.Vassie Moment  Context:  Outcome: Left Message  Phone number: [phone] Phone Type: Home Phone  Comm. type: Telephone Call type: Outgoing  Contact: [name] Relation to patient: Self

## 2017-12-11 ENCOUNTER — Telehealth: Payer: Self-pay | Admitting: Internal Medicine

## 2017-12-11 MED ORDER — APIXABAN 5 MG PO TABS: 5.0000 mg | ORAL_TABLET | Freq: Two times a day (BID) | ORAL | 5 refills | Status: DC

## 2017-12-11 NOTE — Telephone Encounter (Signed)
Copied from [place] [id number]. Topic: Quick Communication - See Telephone Encounter >> Dec 11, 2017 11:08 AM Ether Griffins B wrote: CRM for notification. See Telephone encounter for:  Pt forgot to discuss Dr. Quay Burow taking over her eliquis script she was told to start taking it when she was in the hospital and is needing a refill. Please send to [place], [place] 12/11/17.

## 2017-12-11 NOTE — Telephone Encounter (Signed)
Reviewed chart pt is up-to-date sent refills to [facility].../lmb  

## 2017-12-11 NOTE — Telephone Encounter (Signed)
Signed in error  °

## 2017-12-11 NOTE — Addendum Note (Signed)
Addended by: Earnstine Regal on: 12/11/2017 11:24 AM   Modules accepted: Orders

## 2017-12-16 DIAGNOSIS — J449 Chronic obstructive pulmonary disease, unspecified: Secondary | ICD-10-CM | POA: Diagnosis not present

## 2017-12-19 ENCOUNTER — Other Ambulatory Visit: Payer: Self-pay | Admitting: Internal Medicine

## 2017-12-23 ENCOUNTER — Other Ambulatory Visit: Payer: Self-pay | Admitting: Internal Medicine

## 2017-12-25 ENCOUNTER — Ambulatory Visit (HOSPITAL_COMMUNITY): Payer: PPO | Attending: Internal Medicine

## 2017-12-25 ENCOUNTER — Other Ambulatory Visit: Payer: Self-pay

## 2017-12-25 DIAGNOSIS — E039 Hypothyroidism, unspecified: Secondary | ICD-10-CM | POA: Diagnosis not present

## 2017-12-25 DIAGNOSIS — F419 Anxiety disorder, unspecified: Secondary | ICD-10-CM | POA: Insufficient documentation

## 2017-12-25 DIAGNOSIS — Z86711 Personal history of pulmonary embolism: Secondary | ICD-10-CM | POA: Insufficient documentation

## 2017-12-25 DIAGNOSIS — N289 Disorder of kidney and ureter, unspecified: Secondary | ICD-10-CM | POA: Diagnosis not present

## 2017-12-25 DIAGNOSIS — I5189 Other ill-defined heart diseases: Secondary | ICD-10-CM | POA: Insufficient documentation

## 2017-12-25 DIAGNOSIS — R0600 Dyspnea, unspecified: Secondary | ICD-10-CM | POA: Insufficient documentation

## 2017-12-25 DIAGNOSIS — Z86718 Personal history of other venous thrombosis and embolism: Secondary | ICD-10-CM | POA: Diagnosis not present

## 2017-12-25 DIAGNOSIS — I2699 Other pulmonary embolism without acute cor pulmonale: Secondary | ICD-10-CM | POA: Diagnosis not present

## 2017-12-25 DIAGNOSIS — I517 Cardiomegaly: Secondary | ICD-10-CM | POA: Insufficient documentation

## 2017-12-29 ENCOUNTER — Inpatient Hospital Stay: Payer: PPO

## 2017-12-29 ENCOUNTER — Inpatient Hospital Stay: Payer: PPO | Attending: Hematology and Oncology | Admitting: Hematology and Oncology

## 2017-12-29 ENCOUNTER — Encounter: Payer: Self-pay | Admitting: Hematology and Oncology

## 2017-12-29 ENCOUNTER — Telehealth: Payer: Self-pay | Admitting: Internal Medicine

## 2017-12-29 VITALS — BP 156/74 | HR 93 | Temp 97.8°F | Resp 16 | Ht 69.0 in | Wt 151.1 lb

## 2017-12-29 DIAGNOSIS — Z801 Family history of malignant neoplasm of trachea, bronchus and lung: Secondary | ICD-10-CM | POA: Diagnosis not present

## 2017-12-29 DIAGNOSIS — Z9981 Dependence on supplemental oxygen: Secondary | ICD-10-CM | POA: Diagnosis not present

## 2017-12-29 DIAGNOSIS — Z7901 Long term (current) use of anticoagulants: Secondary | ICD-10-CM

## 2017-12-29 DIAGNOSIS — I2699 Other pulmonary embolism without acute cor pulmonale: Secondary | ICD-10-CM | POA: Diagnosis not present

## 2017-12-29 DIAGNOSIS — I2609 Other pulmonary embolism with acute cor pulmonale: Secondary | ICD-10-CM | POA: Diagnosis not present

## 2017-12-29 DIAGNOSIS — R1312 Dysphagia, oropharyngeal phase: Secondary | ICD-10-CM

## 2017-12-29 DIAGNOSIS — Z923 Personal history of irradiation: Secondary | ICD-10-CM | POA: Diagnosis not present

## 2017-12-29 DIAGNOSIS — F1290 Cannabis use, unspecified, uncomplicated: Secondary | ICD-10-CM

## 2017-12-29 DIAGNOSIS — Z8041 Family history of malignant neoplasm of ovary: Secondary | ICD-10-CM

## 2017-12-29 DIAGNOSIS — Z86718 Personal history of other venous thrombosis and embolism: Secondary | ICD-10-CM

## 2017-12-29 DIAGNOSIS — Z79899 Other long term (current) drug therapy: Secondary | ICD-10-CM

## 2017-12-29 DIAGNOSIS — Z85818 Personal history of malignant neoplasm of other sites of lip, oral cavity, and pharynx: Secondary | ICD-10-CM

## 2017-12-29 DIAGNOSIS — C090 Malignant neoplasm of tonsillar fossa: Secondary | ICD-10-CM

## 2017-12-29 DIAGNOSIS — Z86711 Personal history of pulmonary embolism: Secondary | ICD-10-CM

## 2017-12-29 DIAGNOSIS — E039 Hypothyroidism, unspecified: Secondary | ICD-10-CM

## 2017-12-29 DIAGNOSIS — R600 Localized edema: Secondary | ICD-10-CM | POA: Diagnosis not present

## 2017-12-29 DIAGNOSIS — Z7982 Long term (current) use of aspirin: Secondary | ICD-10-CM | POA: Diagnosis not present

## 2017-12-29 DIAGNOSIS — R0609 Other forms of dyspnea: Secondary | ICD-10-CM

## 2017-12-29 LAB — CMP (CANCER CENTER ONLY)
ALBUMIN: 4.4 g/dL (ref 3.5–5.0)
ALT: 14 U/L (ref 0–55)
ANION GAP: 11 (ref 3–11)
AST: 20 U/L (ref 5–34)
Alkaline Phosphatase: 95 U/L (ref 40–150)
BUN: 18 mg/dL (ref 7–26)
CO2: 29 mmol/L (ref 22–29)
Calcium: 10.3 mg/dL (ref 8.4–10.4)
Chloride: 101 mmol/L (ref 98–109)
Creatinine: 0.93 mg/dL (ref 0.60–1.10)
GFR, Est AFR Am: >60 mL/min (ref >60)
GFR, Estimated: >60 mL/min (ref >60)
GLUCOSE: 91 mg/dL (ref 70–140)
POTASSIUM: 4.5 mmol/L (ref 3.5–5.1)
Sodium: 141 mmol/L (ref 136–145)
Total Bilirubin: 0.5 mg/dL (ref 0.2–1.2)
Total Protein: 7.7 g/dL (ref 6.4–8.3)

## 2017-12-29 LAB — CBC WITH DIFFERENTIAL (CANCER CENTER ONLY)
BASOS PCT: 1 %
Basophils Absolute: 0 10*3/uL (ref 0.0–0.1)
EOS ABS: 0.1 10*3/uL (ref 0.0–0.5)
EOS PCT: 1 %
HCT: 41.3 % (ref 34.8–46.6)
Hemoglobin: 13.6 g/dL (ref 11.6–15.9)
Lymphocytes Relative: 19 %
Lymphs Abs: 1.1 10*3/uL (ref 0.9–3.3)
MCH: 30.1 pg (ref 25.1–34.0)
MCHC: 33 g/dL (ref 31.5–36.0)
MCV: 91.3 fL (ref 79.5–101.0)
MONOS PCT: 5 %
Monocytes Absolute: 0.3 10*3/uL (ref 0.1–0.9)
NEUTROS PCT: 74 %
Neutro Abs: 4.2 10*3/uL (ref 1.5–6.5)
PLATELETS: 278 10*3/uL (ref 145–400)
RBC: 4.52 MIL/uL (ref 3.70–5.45)
RDW: 13.9 % (ref 11.2–14.5)
WBC Count: 5.7 10*3/uL (ref 3.9–10.3)

## 2017-12-29 LAB — LACTATE DEHYDROGENASE: LDH: 189 U/L (ref 125–245)

## 2017-12-29 NOTE — Progress Notes (Signed)
LMTCB-  Please refer to Danielle Pratt's note on what to tell patient when she calls back. TY

## 2017-12-29 NOTE — Telephone Encounter (Signed)
Called patient, unable to reach left message to give us a call back regarding results.  

## 2017-12-30 LAB — BETA-2-GLYCOPROTEIN I ABS, IGG/M/A: Beta-2-Glycoprotein I IgA: <9 GPI IgA units (ref 0–25)

## 2017-12-30 LAB — PROTEIN C ACTIVITY: PROTEIN C ACTIVITY: 148 % (ref 73–180)

## 2017-12-30 LAB — PROTEIN S, TOTAL: PROTEIN S AG TOTAL: 100 % (ref 60–150)

## 2017-12-30 LAB — PROTEIN S ACTIVITY: Protein S Activity: 82 % (ref 63–140)

## 2017-12-30 LAB — HOMOCYSTEINE: Homocysteine: 21.7 umol/L — ABNORMAL HIGH (ref 0.0–15.0)

## 2017-12-30 LAB — ANTITHROMBIN III: ANTITHROMB III FUNC: 107 % (ref 75–120)

## 2017-12-30 NOTE — Progress Notes (Signed)
[facility] New Visit:  Assessment: Pulmonary emboli [facility]) 67 y.o. with recurrent thromboembolic disease including at least 2 separate events of acute pulmonary emboli in March 2017 and January 2019.  On additional history review, the initial thrombotic event likely occurred in 1994 with superficial venous thrombosis or possible DVT in the right lower extremity following an prolonged car travel.  At that time, no diagnostic evaluation or therapy was administered.  Currently, patient is on adequate anticoagulation, but continues to suffer from significant dyspnea on exertion and continues to require oxygen.  My suspicion is that she has been suffering from chronic pulmonary emboli as suggested by the CTA of the chest obtained in January 2018 demonstrating persistent filling defects.  In this situation, she likely has persistent pulmonary hypertension administering the function of the right ventricle and leading to the dyspnea on exertion.  No progression of the clots was noted in any of the previously administered anticoagulants and thus change in the blood thinner is not necessary at this period in time.  Plan: -Obtain hypercoagulable panel today -Continue apixaban, recommend indefinite anticoagulation based on severity of the recurrent VTE. -Will likely consult cardiology, congestive heart failure service to evaluate patient for chronic pulmonary hypertension with possible invasive pulmonary artery pressure measurement.  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  Examination today suggest possible presence of a mass in the right upper neck which most likely is a scar tissue from previous surgery, but additional evaluation is necessary.  In addition, patient suffers from a degree of intermittent dysphasia in the upper neck and together these findings should be further evaluated.  Plan: -CT of the  neck -Refer patient to ENT for evaluation of both possible cancer recurrence and dysphasia etiology.  Return to my clinic in 2 weeks for follow-up to discuss findings of the thrombophilia panel. Voice recognition software was used and creation of this note. Despite my best effort at editing the text, some misspelling/errors may have occurred.  Orders Placed This Encounter  Procedures  . CT Soft Tissue Neck W Contrast    Standing Status:   Future    Standing Expiration Date:   12/29/2018    Order Specific Question:   If indicated for the ordered procedure, I authorize the administration of contrast media per Radiology protocol    Answer:   Yes    Order Specific Question:   Preferred imaging location?    Answer:   [facility]    Order Specific Question:   Radiology Contrast Protocol - do NOT remove file path    Answer:   [email or web address]    Order Specific Question:   Reason for Exam additional comments    Answer:   History of Rt tonsilar cancer. New upper cervical dysphagia -- please eval recurrent malignancy  . CBC with Differential (Cancer Center Only)    Standing Status:   Future    Number of Occurrences:   1    Standing Expiration Date:   12/29/2018  . CMP ([facility] only)    Standing Status:   Future    Number of Occurrences:   1    Standing Expiration Date:   12/29/2018  . Lactate dehydrogenase (LDH)    Standing Status:   Future    Number of Occurrences:   1    Standing Expiration Date:   12/29/2018  . Antithrombin III  . Protein C activity  . Protein C,  total  . Protein S activity  . Protein S, total  . Lupus anticoagulant panel  . Beta-2-glycoprotein i abs, IgG/M/A  . Homocysteine, serum  . Factor 5 leiden  . Prothrombin gene mutation  . Cardiolipin antibodies, IgG, IgM, IgA  . Ambulatory referral to ENT    Referral Priority:   Routine    Referral Type:   Consultation    Referral Reason:   Specialty Services Required     Requested Specialty:   Otolaryngology    Number of Visits Requested:   1    All questions were answered. . The patient knows to call the clinic with any problems, questions or concerns.  This note was electronically signed.    History of Presenting Illness Danielle Pratt 67 y.o. presenting to the [facility] for history of pulmonary emboli and previous history of right tonsillar squamous cell carcinoma, referred by Dr Catha Brow patient's past medical history.  Includes hypothyroidism and chronic dry mouth.  Otherwise she is quite healthy.  Patient is a lifelong non-smoker, drinks 3 beers per week and occasionally uses cannabis.  Patient has no family history of clotting disorders, bleeding disorders, or malignancy.  Her hematological history will be outlined below in the oncological/hematological history section.  Patient has not been feeling well since May 2018 describing persistent shortness of breath, mostly with activity, decreased amount of activity, and no chest pain, or swelling in the lower extremities at that time.  Subsequently, patient presented in January 2019 with symptoms of pleuritic chest pain, progressive dyspnea on exertion, and nonproductive cough without hemoptysis.  Evaluation including imaging demonstrated presence of recurrent pulmonary emboli and repeat CTA demonstrated right ventricular heart strain.  Patient is currently on apixaban for therapeutic anticoagulation.  No previous thrombophilia workup was undertaken.  Additionally, patient does complain of intermittent dysphagia of solid greater than liquid foods in the upper neck.  No cough with food, no regurgitation of undigested food noted.  No lower esophageal dysphagia noted.  Oncological/hematological History: **Recurrent VTE (PE/SVT):  --[place] LtLE, 09/10/13: Unruptured left popliteal cyst. --Colonoscopy, 11/10/15: Tubular adenomas without malignancy.  --Event #1, 1994: Thrombosis in the right lower extremity  especially in the patellar fossa following a long range car trip to [place].    --Treatment: No systemic anticoagulant given at the time.  --Event #2, Mar 2017: Event occurred in association with decreased mobility due to brace applied for dislocation of the implanted hip.  Patient initially presented with chest discomfort   --CTA Chest, 01/31/16: Pulmonary arterial opacification is adequate. Filling defects consistent with emboli involve segmental arterial branches in the anterior segment right upper lobe, lateral segment right middle lobe, and basilar right lower lobe segments. There is also involvement of multiple segmental and subsegmental left lower lobe branches as well as subsegmental left upper lobe branches. No central embolus is present. The right ventricle/left ventricle ratio is 4.6 cm/4.9 cm = 0.94. There is flattening of the interventricular septum. The central pulmonary arteries superior slightly enlarged, with the main pulmonary artery measuring 3.1 cm in diameter.  --Doppler US RtLE, 01/31/16: Acute deep vein thrombosis involving the right mid posterior tibial veins and right gastrocnemius vein.  --ECHO, 02/02/16: LVEF 50%-55%; Diastolic flattening of left ventricular septum is suggestive of increased right sided pressure. Unable to estimate secondary to unattainable tricuspid regurgitation Doppler signal. Right ventricle -- The cavity size was normal. Wall thickness was normal. Systolic function was normal. Pulmonary artery -- The main pulmonary artery was normal-sized. Systolic pressure was within  the normal range. Right atrium -- The atrium was normal in size. Inferior vena cava -- The vessel was normal in size.   --Treatment: rivaroxaban x6 months --CTA Chest, 11/17/16: Cardiovascular -- Satisfactory opacification of the pulmonary arteries to the segmental level. There are several small filling defects within segmental pulmonary artery which correspond directly to pulmonary  embolus on the prior CT the chest although the luminal filling defects are now are decreased in size, with synechial pattern, or marginalized. The pattern is compatible with chronic thromboembolic disease. No definite acute pulmonary embolus is remains identified. Normal caliber thoracic aorta. Borderline main pulmonary artery may represent underlying pulmonary artery hypertension. Normal heart size. No evidence for right heart strain. --Mammogram BL Screening, 06/10/17: No mammographic evidence of malignancy. BI-RADS CATEGORY  1 -- Negative.  --Event #3, 01/01-05/19: Patient presented with  cough, progressive pleuritic chest discomfort, and progressive dyspnea on exertion.  No immediate precipitating events, but patient did travel to [place], [place] at the end of May 2018.  --CTA Chest, 11/11/17: Cardiovascular -- Since previous study, there are new filling defects demonstrated in the segmental and subsegmental branches of upper and lower lobe pulmonary arteries bilaterally. This represents new development of acute on chronic pulmonary emboli. RV to LV ratio is 0.87, suggesting no evidence of right heart strain. Mild diffuse cardiac enlargement. Dilated ascending thoracic aorta at 3.2 cm diameter. No pericardial effusion. Few scattered calcifications in the aorta.  --Doppler US BL LE, 11/13/17: No evidence of deep vein thrombosis in either lower extremity.  Cystic structure identified in the left popliteal fossa  --ECHO, 11/13/17: LVEF 60%-65%. Ventricular septum -- Septal motion showed paradox. The contour showed diastolic flattening and systolic flattening. Right ventricle -- Moderately dilated. Right atrium -- Severely dilated. Pulmonary arteries -- PA peak pressure 22 mmHg. Inferior vena cava -- The vessel was normal in size. The respirophasic diameter changes were in the normal range (>= 50%), consistent with normal central venous pressure.  --CTA Chest, 11/13/17: Cardiovascular -- The thoracic aorta  is incompletely evaluated due to the timing of the contrast bolus. The pulmonary artery demonstrates a normal branching pattern. There are again multiple filling defects identified bilaterally. The overall amount of clot burden is stable when compared with the prior exam. The RV/ LV ratio however is now greater than 1 indicating a degree of right heart strain. There is also some reflux into the hepatic veins which has increased in the interval from the prior exam consistent with increased central venous pressure. No coronary calcifications are noted.  --Treatment: Heparin drip --> apixaban --ECHO, 12/25/17: LVEF 50-55%, Right ventricle -- The cavity size was mildly dilated. Systolic function was mildly to moderately reduced. Right atrium -- The atrium was normal in size. Inferior vena cava -- The vessel was normal in size. The respirophasic diameter changes were in the normal range (>= 50%), consistent with normal central venous pressure   **Tonsillar SCC, Rt: diagnosed and treated with Rt tonsillectomy and adjuvant radiation therapy without systemic therapy in 1997. No recurrence evident since Medical History: Past Medical History:  Diagnosis Date  . Anxiety   . Arthritis    "hands" (11/12/2017)  . Cataract    extraction surgery  . DVT (deep venous thrombosis) ([place]) 01/2016   LLE  . Fasting hyperglycemia    110 in 01/2011;131 in 05/2007  . Hypothyroidism   . Popliteal cyst   . Pulmonary embolism ([place]) 2017; 11/11/2017   "the one in 2017 was due to LLE DVT; don't why I  had the one 11/11/2017"  . Renal insufficiency   . Tonsillar cancer [facility]) 1998    Surgical History: Past Surgical History:  Procedure Laterality Date  . CESAREAN SECTION  1984   G 1 P 2  . COLONOSCOPY  2003   negative; Dr Henrene Pastor  . JOINT REPLACEMENT    . LYMPHADENECTOMY Right 1998   neck  . TONSILLECTOMY AND ADENOIDECTOMY Right 1998   R tonsilar malignancy; subsequent LN dissection negative  . TOTAL ABDOMINAL  HYSTERECTOMY  1980s   TOTAL ABDOMINAL HYSTERECTOMY W/ BILATERAL SALPINGOOPHORECTOMY [SHX83]; fibroids; Dr. Nori Riis  . TOTAL HIP ARTHROPLASTY Bilateral 2007   Dr Maureen Ralphs, both hips 3 months apart    Family History: Family History  Problem Relation Age of Onset  . Hyperlipidemia Mother   . Hypertension Mother   . Ovarian cancer Mother   . Aneurysm Mother        ascending & descending aorta  . Heart attack Father 35  . Leukemia Paternal Grandmother   . Lung cancer Paternal Grandfather   . Diabetes Neg Hx   . Stroke Neg Hx   . Colon cancer Neg Hx   . Stomach cancer Neg Hx   . Rectal cancer Neg Hx     Social History: Social History   Socioeconomic History  . Marital status: Married    Spouse name: Not on file  . Number of children: Not on file  . Years of education: Not on file  . Highest education level: Not on file  Social Needs  . Financial resource strain: Not on file  . Food insecurity - worry: Not on file  . Food insecurity - inability: Not on file  . Transportation needs - medical: Not on file  . Transportation needs - non-medical: Not on file  Occupational History  . Not on file  Tobacco Use  . Smoking status: Never Smoker  . Smokeless tobacco: Never Used  Substance and Sexual Activity  . Alcohol use: Yes    Alcohol/week: 1.8 oz    Types: 3 Cans of beer per week  . Drug use: No  . Sexual activity: Yes  Other Topics Concern  . Not on file  Social History Narrative  . Not on file    Allergies: No Known Allergies  Medications:  Current Outpatient Medications  Medication Sig Dispense Refill  . apixaban (ELIQUIS) 5 MG TABS tablet Take 1 tablet (5 mg total) by mouth 2 (two) times daily. 60 tablet 5  . aspirin EC 81 MG tablet Take 1 tablet (81 mg total) by mouth daily. (Patient taking differently: Take 81 mg by mouth at bedtime. )    . Cholecalciferol (VITAMIN D) 2000 units tablet Take 2,000 Units by mouth at bedtime.    . clobetasol cream (TEMOVATE) 4.69 %  Apply 1 application topically at bedtime as needed (skin irritation/ finger).    [name] levothyroxine (SYNTHROID, LEVOTHROID) 50 MCG tablet Take 1.5 tablets (75 mcg total) by mouth daily before breakfast. 126 tablet 0  . pilocarpine (SALAGEN) 5 MG tablet Take 5 mg by mouth See admin instructions. Take 1 tablet (5 mg) by mouth 3 times daily - early morning, midday and bedtime, may take an additional tablet during the day as needed for dry mouth.    . venlafaxine XR (EFFEXOR-XR) 75 MG 24 hr capsule TAKE ONE CAPSULE BY MOUTH AT BEDTIME 90 capsule 1   No current facility-administered medications for this visit.     Review of Systems: Review of Systems  HENT:  Positive for trouble swallowing.   Respiratory: Positive for cough and shortness of breath.   All other systems reviewed and are negative.    PHYSICAL EXAMINATION Blood pressure (!) 156/74, pulse 93, temperature 97.8 F (36.6 C), temperature source Oral, resp. rate 16, height '5\' 9"'  (1.753 m), weight 151 lb 1.6 oz (68.5 kg), SpO2 99 %.  ECOG PERFORMANCE STATUS: 2 - Symptomatic, <50% confined to bed  Physical Exam  Constitutional: She is oriented to person, place, and time and well-developed, well-nourished, and in no distress. No distress.  HENT:  Head: Normocephalic and atraumatic.  Mouth/Throat: Oropharynx is clear and moist. No oropharyngeal exudate.  Eyes: Conjunctivae and EOM are normal. Pupils are equal, round, and reactive to light. No scleral icterus.  Neck: No thyromegaly present.  Right neck with postoperative well-healed scar with a soft irregular density underneath the scar not present on the contralateral side.  Cardiovascular: Normal rate, regular rhythm and normal heart sounds.  No murmur heard. Pulmonary/Chest: Effort normal and breath sounds normal. No respiratory distress. She has no wheezes. She has no rales.  Abdominal: Soft. Bowel sounds are normal. She exhibits no distension and no mass. There is no tenderness.  There is no guarding.  Musculoskeletal: She exhibits no edema.  Lymphadenopathy:    She has no cervical adenopathy.  Neurological: She is alert and oriented to person, place, and time. She has normal reflexes. No cranial nerve deficit.  Skin: Skin is warm and dry. No rash noted. She is not diaphoretic. No erythema. No pallor.     LABORATORY DATA: I have personally reviewed the data as listed: Appointment on 12/29/2017  Component Date Value Ref Range Status  . WBC Count 12/29/2017 5.7  3.9 - 10.3 K/uL Final  . RBC 12/29/2017 4.52  3.70 - 5.45 MIL/uL Final  . Hemoglobin 12/29/2017 13.6  11.6 - 15.9 g/dL Final  . HCT 12/29/2017 41.3  34.8 - 46.6 % Final  . MCV 12/29/2017 91.3  79.5 - 101.0 fL Final  . MCH 12/29/2017 30.1  25.1 - 34.0 pg Final  . MCHC 12/29/2017 33.0  31.5 - 36.0 g/dL Final  . RDW 12/29/2017 13.9  11.2 - 14.5 % Final  . Platelet Count 12/29/2017 278  145 - 400 K/uL Final  . Neutrophils Relative % 12/29/2017 74  % Final  . Neutro Abs 12/29/2017 4.2  1.5 - 6.5 K/uL Final  . Lymphocytes Relative 12/29/2017 19  % Final  . Lymphs Abs 12/29/2017 1.1  0.9 - 3.3 K/uL Final  . Monocytes Relative 12/29/2017 5  % Final  . Monocytes Absolute 12/29/2017 0.3  0.1 - 0.9 K/uL Final  . Eosinophils Relative 12/29/2017 1  % Final  . Eosinophils Absolute 12/29/2017 0.1  0.0 - 0.5 K/uL Final  . Basophils Relative 12/29/2017 1  % Final  . Basophils Absolute 12/29/2017 0.0  0.0 - 0.1 K/uL Final   Performed at [facility], [place] [address], [place], [place] [address]  . Sodium 12/29/2017 141  136 - 145 mmol/L Final  . Potassium 12/29/2017 4.5  3.5 - 5.1 mmol/L Final  . Chloride 12/29/2017 101  98 - 109 mmol/L Final  . CO2 12/29/2017 29  22 - 29 mmol/L Final  . Glucose, Bld 12/29/2017 91  70 - 140 mg/dL Final  . BUN 12/29/2017 18  7 - 26 mg/dL Final  . Creatinine 12/29/2017 0.93  0.60 - 1.10 mg/dL Final  . Calcium 12/29/2017 10.3  8.4 - 10.4 mg/dL Final  .  Total  Protein 12/29/2017 7.7  6.4 - 8.3 g/dL Final  . Albumin 12/29/2017 4.4  3.5 - 5.0 g/dL Final  . AST 12/29/2017 20  5 - 34 U/L Final  . ALT 12/29/2017 14  0 - 55 U/L Final  . Alkaline Phosphatase 12/29/2017 95  40 - 150 U/L Final  . Total Bilirubin 12/29/2017 0.5  0.2 - 1.2 mg/dL Final  . GFR, Est Non Af Am 12/29/2017 >60  >60 mL/min Final  . GFR, Est AFR Am 12/29/2017 >60  >60 mL/min Final   Comment: (NOTE) The eGFR has been calculated using the CKD EPI equation. This calculation has not been validated in all clinical situations. eGFR's persistently <60 mL/min signify possible Chronic Kidney Disease.   Georgiann Hahn gap 12/29/2017 11  3 - 11 Final   Performed at [facility], [place] [address], [place], [place] [address]  . LDH 12/29/2017 189  125 - 245 U/L Final   Performed at [facility], [place] [address], [place], [place] [address]  Office Visit on 12/29/2017  Component Date Value Ref Range Status  . AntiThromb III Func 12/29/2017 107  75 - 120 % Final   Performed at [place] [address], [place], [place] [address]  . Beta-2 Glyco I IgG 12/29/2017 <9  0 - 20 GPI IgG units Final   Comment: (NOTE) The reference interval reflects a 3SD or 99th percentile interval, which is thought to represent a potentially clinically significant result in accordance with the International Consensus Statement on the classification criteria for definitive antiphospholipid syndrome (APS). J Thromb Haem 2006;4:295-306.   . Beta-2-Glycoprotein I IgM 12/29/2017 <9  0 - 32 GPI IgM units Final   Comment: (NOTE) The reference interval reflects a 3SD or 99th percentile interval, which is thought to represent a potentially clinically significant result in accordance with the International Consensus Statement on the classification criteria for definitive antiphospholipid syndrome (APS). J Thromb Haem 2006;4:295-306. Performed At: [facility] [place], [place] [id number] Rush Farmer MD LP:[id number]   . Beta-2-Glycoprotein I IgA 12/29/2017 <9  0 - 25 GPI IgA units Final   Comment: (NOTE) The reference interval reflects a 3SD or 99th percentile interval, which is thought to represent a potentially clinically significant result in accordance with the International Consensus Statement on the classification criteria for definitive antiphospholipid syndrome (APS). J Thromb Haem 2006;4:295-306. Performed at [facility], [place] [address], [place],  [address]          Ardath Sax, MD

## 2017-12-30 NOTE — Telephone Encounter (Signed)
lmtcb for pt.  Echo results copied below:   Notes recorded by Dolores Lory, RN on 12/26/2017 at 4:14 PM EST Called to talk with patient about results but no answer. Left message for patient to call back. When patient calls back we will let patient know to continue her Eliquis and that a repeat echo is needed in 3 months and will place the order for repeat echo when patient is aware.

## 2017-12-31 ENCOUNTER — Encounter (HOSPITAL_COMMUNITY): Payer: Self-pay

## 2017-12-31 ENCOUNTER — Telehealth: Payer: Self-pay

## 2017-12-31 ENCOUNTER — Ambulatory Visit (HOSPITAL_COMMUNITY)
Admission: RE | Admit: 2017-12-31 | Discharge: 2017-12-31 | Disposition: A | Discharge status: Home / Self Care | Payer: PPO | Source: Ambulatory Visit | Attending: Hematology and Oncology | Admitting: Hematology and Oncology

## 2017-12-31 DIAGNOSIS — K110 Atrophy of salivary gland: Secondary | ICD-10-CM | POA: Insufficient documentation

## 2017-12-31 DIAGNOSIS — R1312 Dysphagia, oropharyngeal phase: Secondary | ICD-10-CM | POA: Diagnosis not present

## 2017-12-31 DIAGNOSIS — C090 Malignant neoplasm of tonsillar fossa: Secondary | ICD-10-CM | POA: Diagnosis not present

## 2017-12-31 DIAGNOSIS — Z923 Personal history of irradiation: Secondary | ICD-10-CM | POA: Diagnosis not present

## 2017-12-31 DIAGNOSIS — R1310 Dysphagia, unspecified: Secondary | ICD-10-CM | POA: Diagnosis not present

## 2017-12-31 LAB — CARDIOLIPIN ANTIBODIES, IGG, IGM, IGA
Anticardiolipin IgA: <9 APL U/mL (ref 0–11)
Anticardiolipin IgG: <9 GPL U/mL (ref 0–14)
Anticardiolipin IgM: <9 MPL U/mL (ref 0–12)

## 2017-12-31 LAB — DRVVT CONFIRM: dRVVT Confirm: 1.1 ratio (ref 0.8–1.2)

## 2017-12-31 LAB — DRVVT MIX: dRVVT Mix: 54.8 s — ABNORMAL HIGH (ref 0.0–47.0)

## 2017-12-31 LAB — PROTEIN C, TOTAL: Protein C, Total: 112 % (ref 60–150)

## 2017-12-31 LAB — LUPUS ANTICOAGULANT PANEL
DRVVT: 64.5 s — ABNORMAL HIGH (ref 0.0–47.0)
PTT LA: 32.9 s (ref 0.0–51.9)

## 2017-12-31 MED ORDER — IOPAMIDOL (ISOVUE-300) INJECTION 61%
INTRAVENOUS | Status: AC
  Filled 2017-12-31: qty 75

## 2017-12-31 MED ORDER — IOPAMIDOL (ISOVUE-300) INJECTION 61%
75.0000 mL | Freq: Once | INTRAVENOUS | Status: AC | PRN
  Administered 2017-12-31: 75 mL via INTRAVENOUS

## 2017-12-31 NOTE — Telephone Encounter (Signed)
Pt is calling back [phone]

## 2017-12-31 NOTE — Telephone Encounter (Signed)
lmtcb x2 for pt. 

## 2017-12-31 NOTE — Telephone Encounter (Signed)
Called pt letting her know the result of the echo and that she needed to continue eliquis and repeat echo in 3 months.  Pt expressed understanding. Order placed to have echo repeated in 3 months.  Nothing further needed at this current time.

## 2017-12-31 NOTE — Telephone Encounter (Signed)
Called and verified that [facility] did receive and was currently working on patient referral. Per 2/20 RN needed a update

## 2018-01-01 DIAGNOSIS — R1312 Dysphagia, oropharyngeal phase: Secondary | ICD-10-CM | POA: Insufficient documentation

## 2018-01-01 NOTE — Assessment & Plan Note (Addendum)
67 y.o. with recurrent thromboembolic disease including at least 2 separate events of acute pulmonary emboli in March 2017 and January 2019.  On additional history review, the initial thrombotic event likely occurred in 1994 with superficial venous thrombosis or possible DVT in the right lower extremity following an prolonged car travel.  At that time, no diagnostic evaluation or therapy was administered.  Currently, patient is on adequate anticoagulation, but continues to suffer from significant dyspnea on exertion and continues to require oxygen.  My suspicion is that she has been suffering from chronic pulmonary emboli as suggested by the CTA of the chest obtained in January 2018 demonstrating persistent filling defects.  In this situation, she likely has persistent pulmonary hypertension administering the function of the right ventricle and leading to the dyspnea on exertion.  No progression of the clots was noted in any of the previously administered anticoagulants and thus change in the blood thinner is not necessary at this period in time.  Plan: -Obtain hypercoagulable panel today -Continue apixaban, recommend indefinite anticoagulation based on severity of the recurrent VTE. -Will likely consult cardiology, congestive heart failure service to evaluate patient for chronic pulmonary hypertension with possible invasive pulmonary artery pressure measurement.

## 2018-01-01 NOTE — Assessment & Plan Note (Signed)
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  Examination today suggest possible presence of a mass in the right upper neck which most likely is a scar tissue from previous surgery, but additional evaluation is necessary.  In addition, patient suffers from a degree of intermittent dysphasia in the upper neck and together these findings should be further evaluated.  Plan: -CT of the neck -Refer patient to ENT for evaluation of both possible cancer recurrence and dysphasia etiology.

## 2018-01-02 LAB — FACTOR 5 LEIDEN

## 2018-01-05 LAB — PROTHROMBIN GENE MUTATION

## 2018-01-09 DIAGNOSIS — I2699 Other pulmonary embolism without acute cor pulmonale: Secondary | ICD-10-CM | POA: Diagnosis not present

## 2018-01-09 DIAGNOSIS — Z923 Personal history of irradiation: Secondary | ICD-10-CM | POA: Diagnosis not present

## 2018-01-09 DIAGNOSIS — R1314 Dysphagia, pharyngoesophageal phase: Secondary | ICD-10-CM | POA: Diagnosis not present

## 2018-01-09 DIAGNOSIS — Z85818 Personal history of malignant neoplasm of other sites of lip, oral cavity, and pharynx: Secondary | ICD-10-CM | POA: Diagnosis not present

## 2018-01-12 ENCOUNTER — Telehealth: Payer: Self-pay | Admitting: Hematology and Oncology

## 2018-01-12 ENCOUNTER — Inpatient Hospital Stay: Payer: PPO | Attending: Hematology and Oncology | Admitting: Hematology and Oncology

## 2018-01-12 ENCOUNTER — Encounter: Payer: Self-pay | Admitting: Hematology and Oncology

## 2018-01-12 VITALS — BP 124/79 | HR 89 | Temp 97.7°F | Resp 18 | Wt 152.8 lb

## 2018-01-12 DIAGNOSIS — Z79899 Other long term (current) drug therapy: Secondary | ICD-10-CM | POA: Diagnosis not present

## 2018-01-12 DIAGNOSIS — R0609 Other forms of dyspnea: Secondary | ICD-10-CM | POA: Diagnosis not present

## 2018-01-12 DIAGNOSIS — E039 Hypothyroidism, unspecified: Secondary | ICD-10-CM

## 2018-01-12 DIAGNOSIS — M1990 Unspecified osteoarthritis, unspecified site: Secondary | ICD-10-CM

## 2018-01-12 DIAGNOSIS — Z86718 Personal history of other venous thrombosis and embolism: Secondary | ICD-10-CM

## 2018-01-12 DIAGNOSIS — Z85818 Personal history of malignant neoplasm of other sites of lip, oral cavity, and pharynx: Secondary | ICD-10-CM | POA: Diagnosis not present

## 2018-01-12 DIAGNOSIS — Z86711 Personal history of pulmonary embolism: Secondary | ICD-10-CM | POA: Diagnosis not present

## 2018-01-12 DIAGNOSIS — F1290 Cannabis use, unspecified, uncomplicated: Secondary | ICD-10-CM

## 2018-01-12 DIAGNOSIS — Z9981 Dependence on supplemental oxygen: Secondary | ICD-10-CM

## 2018-01-12 DIAGNOSIS — Z7901 Long term (current) use of anticoagulants: Secondary | ICD-10-CM | POA: Diagnosis not present

## 2018-01-12 DIAGNOSIS — Z7982 Long term (current) use of aspirin: Secondary | ICD-10-CM | POA: Diagnosis not present

## 2018-01-12 DIAGNOSIS — R682 Dry mouth, unspecified: Secondary | ICD-10-CM | POA: Diagnosis not present

## 2018-01-12 DIAGNOSIS — Z982 Presence of cerebrospinal fluid drainage device: Secondary | ICD-10-CM | POA: Diagnosis not present

## 2018-01-12 DIAGNOSIS — I2609 Other pulmonary embolism with acute cor pulmonale: Secondary | ICD-10-CM

## 2018-01-12 DIAGNOSIS — Z801 Family history of malignant neoplasm of trachea, bronchus and lung: Secondary | ICD-10-CM | POA: Diagnosis not present

## 2018-01-12 NOTE — Telephone Encounter (Signed)
Scheduled apt per 3/4 los - Patient is aware of appt date and time - no print out wanted.

## 2018-01-12 NOTE — Progress Notes (Signed)
[facility] New Visit:  Assessment: Pulmonary emboli [facility]) 67 y.o. with recurrent thromboembolic disease including at least 2 separate events of acute pulmonary emboli in March 2017 and January 2019.  On additional history review, the initial thrombotic event likely occurred in 1994 with superficial venous thrombosis or possible DVT in the right lower extremity following an prolonged car travel.  At that time, no diagnostic evaluation or therapy was administered.  Currently, patient is on adequate anticoagulation, but continues to suffer from significant dyspnea on exertion and continues to require oxygen.  My suspicion is that she has been suffering from chronic pulmonary emboli as suggested by the CTA of the chest obtained in January 2018 demonstrating persistent filling defects.  In this situation, she likely has persistent pulmonary hypertension administering the function of the right ventricle and leading to the dyspnea on exertion.  No progression of the clots was noted in any of the previously administered anticoagulants and thus change in the blood thinner is not necessary at this period in time.  Hypercoagulable panel negative for additional contributing factors to explain recurrent thromboembolism.  Plan: -Continue apixaban, recommend indefinite anticoagulation based on severity of the recurrent VTE. -Consult cardiology, congestive heart failure service to evaluate patient for chronic pulmonary hypertension with possible invasive pulmonary artery pressure measurement. -Proceed with echocardiogram. -Return to my clinic in 6 months for clinical monitoring.  Voice recognition software was used and creation of this note. Despite my best effort at editing the text, some misspelling/errors may have occurred.  Orders Placed This Encounter  Procedures  . Ambulatory referral to Cardiology    Referral Priority:   Routine    Referral Type:   Consultation    Referral Reason:    Specialty Services Required    Requested Specialty:   Cardiology    Number of Visits Requested:   1    All questions were answered. . The patient knows to call the clinic with any problems, questions or concerns.  This note was electronically signed.    History of Presenting Illness Danielle Pratt 67 y.o. presenting to the [facility] for history of pulmonary emboli and previous history of right tonsillar squamous cell carcinoma, referred by Dr Catha Brow patient's past medical history.  Includes hypothyroidism and chronic dry mouth.  Otherwise she is quite healthy.  Patient is a lifelong non-smoker, drinks 3 beers per week and occasionally uses cannabis.  Patient has no family history of clotting disorders, bleeding disorders, or malignancy.  Her hematological history will be outlined below in the oncological/hematological history section.  Patient has not been feeling well since May 2018 describing persistent shortness of breath, mostly with activity, decreased amount of activity, and no chest pain, or swelling in the lower extremities at that time.  Subsequently, patient presented in January 2019 with symptoms of pleuritic chest pain, progressive dyspnea on exertion, and nonproductive cough without hemoptysis.  Evaluation including imaging demonstrated presence of recurrent pulmonary emboli and repeat CTA demonstrated right ventricular heart strain.  Patient is currently on apixaban for therapeutic anticoagulation.  No previous thrombophilia workup was undertaken.  Additionally, patient does complain of intermittent dysphagia of solid greater than liquid foods in the upper neck.  No cough with food, no regurgitation of undigested food noted.  No lower esophageal dysphagia noted.  Oncological/hematological History: **Recurrent VTE (PE/SVT):  --[place] LtLE, 09/10/13: Unruptured left popliteal cyst. --Colonoscopy, 11/10/15: Tubular adenomas without malignancy.  --Event #1, 1994: Thrombosis  in the right lower extremity especially in  the patellar fossa following a long range car trip to [place].    --Treatment: No systemic anticoagulant given at the time.  --Event #2, Mar 2017: Event occurred in association with decreased mobility due to brace applied for dislocation of the implanted hip.  Patient initially presented with chest discomfort   --CTA Chest, 01/31/16: Pulmonary arterial opacification is adequate. Filling defects consistent with emboli involve segmental arterial branches in the anterior segment right upper lobe, lateral segment right middle lobe, and basilar right lower lobe segments. There is also involvement of multiple segmental and subsegmental left lower lobe branches as well as subsegmental left upper lobe branches. No central embolus is present. The right ventricle/left ventricle ratio is 4.6 cm/4.9 cm = 0.94. There is flattening of the interventricular septum. The central pulmonary arteries superior slightly enlarged, with the main pulmonary artery measuring 3.1 cm in diameter.  --Doppler US RtLE, 01/31/16: Acute deep vein thrombosis involving the right mid posterior tibial veins and right gastrocnemius vein.  --ECHO, 02/02/16: LVEF 50%-55%; Diastolic flattening of left ventricular septum is suggestive of increased right sided pressure. Unable to estimate secondary to unattainable tricuspid regurgitation Doppler signal. Right ventricle -- The cavity size was normal. Wall thickness was normal. Systolic function was normal. Pulmonary artery -- The main pulmonary artery was normal-sized. Systolic pressure was within the normal range. Right atrium -- The atrium was normal in size. Inferior vena cava -- The vessel was normal in size.   --Treatment: rivaroxaban x6 months --CTA Chest, 11/17/16: Cardiovascular -- Satisfactory opacification of the pulmonary arteries to the segmental level. There are several small filling defects within segmental pulmonary artery which correspond  directly to pulmonary embolus on the prior CT the chest although the luminal filling defects are now are decreased in size, with synechial pattern, or marginalized. The pattern is compatible with chronic thromboembolic disease. No definite acute pulmonary embolus is remains identified. Normal caliber thoracic aorta. Borderline main pulmonary artery may represent underlying pulmonary artery hypertension. Normal heart size. No evidence for right heart strain. --Mammogram BL Screening, 06/10/17: No mammographic evidence of malignancy. BI-RADS CATEGORY  1 -- Negative.  --Event #3, 01/01-05/19: Patient presented with  cough, progressive pleuritic chest discomfort, and progressive dyspnea on exertion.  No immediate precipitating events, but patient did travel to [place], [place] at the end of May 2018.  --CTA Chest, 11/11/17: Cardiovascular -- Since previous study, there are new filling defects demonstrated in the segmental and subsegmental branches of upper and lower lobe pulmonary arteries bilaterally. This represents new development of acute on chronic pulmonary emboli. RV to LV ratio is 0.87, suggesting no evidence of right heart strain. Mild diffuse cardiac enlargement. Dilated ascending thoracic aorta at 3.2 cm diameter. No pericardial effusion. Few scattered calcifications in the aorta.  --Doppler US BL LE, 11/13/17: No evidence of deep vein thrombosis in either lower extremity.  Cystic structure identified in the left popliteal fossa  --ECHO, 11/13/17: LVEF 60%-65%. Ventricular septum -- Septal motion showed paradox. The contour showed diastolic flattening and systolic flattening. Right ventricle -- Moderately dilated. Right atrium -- Severely dilated. Pulmonary arteries -- PA peak pressure 22 mmHg. Inferior vena cava -- The vessel was normal in size. The respirophasic diameter changes were in the normal range (>= 50%), consistent with normal central venous pressure.  --CTA Chest, 11/13/17: Cardiovascular  -- The thoracic aorta is incompletely evaluated due to the timing of the contrast bolus. The pulmonary artery demonstrates a normal branching pattern. There are again multiple filling defects identified bilaterally. The overall  amount of clot burden is stable when compared with the prior exam. The RV/ LV ratio however is now greater than 1 indicating a degree of right heart strain. There is also some reflux into the hepatic veins which has increased in the interval from the prior exam consistent with increased central venous pressure. No coronary calcifications are noted.  --Treatment: Heparin drip --> apixaban --ECHO, 12/25/17: LVEF 50-55%, Right ventricle -- The cavity size was mildly dilated. Systolic function was mildly to moderately reduced. Right atrium -- The atrium was normal in size. Inferior vena cava -- The vessel was normal in size. The respirophasic diameter changes were in the normal range (>= 50%), consistent with normal central venous pressure --Labs, 12/29/17: Thrombophilia panel negative for--anticardiolipin antibodies, beta-2 glycoprotein antibodies, lupus anticoagulant, protein C deficiency, protein S deficiency, Antithrombin deficiency, no evidence of prothrombin gene or factor V Leiden mutations.   **Tonsillar SCC, Rt: diagnosed and treated with Rt tonsillectomy and adjuvant radiation therapy without systemic therapy in 1997. No recurrence evident since   Medical History: Past Medical History:  Diagnosis Date  . Anxiety   . Arthritis    "hands" (11/12/2017)  . Cataract    extraction surgery  . DVT (deep venous thrombosis) ([facility]) 01/2016   LLE  . Fasting hyperglycemia    110 in 01/2011;131 in 05/2007  . Hypothyroidism   . Popliteal cyst   . Pulmonary embolism ([facility]) 2017; 11/11/2017   "the one in 2017 was due to LLE DVT; don't why I had the one 11/11/2017"  . Renal insufficiency   . Tonsillar cancer [facility]) 1998    Surgical History: Past Surgical History:  Procedure  Laterality Date  . CESAREAN SECTION  1984   G 1 P 2  . COLONOSCOPY  2003   negative; Dr Henrene Pastor  . JOINT REPLACEMENT    . LYMPHADENECTOMY Right 1998   neck  . TONSILLECTOMY AND ADENOIDECTOMY Right 1998   R tonsilar malignancy; subsequent LN dissection negative  . TOTAL ABDOMINAL HYSTERECTOMY  1980s   TOTAL ABDOMINAL HYSTERECTOMY W/ BILATERAL SALPINGOOPHORECTOMY [SHX83]; fibroids; Dr. Nori Riis  . TOTAL HIP ARTHROPLASTY Bilateral 2007   Dr Maureen Ralphs, both hips 3 months apart    Family History: Family History  Problem Relation Age of Onset  . Hyperlipidemia Mother   . Hypertension Mother   . Ovarian cancer Mother   . Aneurysm Mother        ascending & descending aorta  . Heart attack Father 74  . Leukemia Paternal Grandmother   . Lung cancer Paternal Grandfather   . Diabetes Neg Hx   . Stroke Neg Hx   . Colon cancer Neg Hx   . Stomach cancer Neg Hx   . Rectal cancer Neg Hx     Social History: Social History   Socioeconomic History  . Marital status: Married    Spouse name: Not on file  . Number of children: Not on file  . Years of education: Not on file  . Highest education level: Not on file  Social Needs  . Financial resource strain: Not on file  . Food insecurity - worry: Not on file  . Food insecurity - inability: Not on file  . Transportation needs - medical: Not on file  . Transportation needs - non-medical: Not on file  Occupational History  . Not on file  Tobacco Use  . Smoking status: Never Smoker  . Smokeless tobacco: Never Used  Substance and Sexual Activity  . Alcohol use: Yes  Alcohol/week: 1.8 oz    Types: 3 Cans of beer per week  . Drug use: No  . Sexual activity: Yes  Other Topics Concern  . Not on file  Social History Narrative  . Not on file    Allergies: No Known Allergies  Medications:  Current Outpatient Medications  Medication Sig Dispense Refill  . apixaban (ELIQUIS) 5 MG TABS tablet Take 1 tablet (5 mg total) by mouth 2 (two) times  daily. 60 tablet 5  . aspirin EC 81 MG tablet Take 1 tablet (81 mg total) by mouth daily. (Patient taking differently: Take 81 mg by mouth at bedtime. )    . Cholecalciferol (VITAMIN D) 2000 units tablet Take 4,000 Units by mouth at bedtime.     . clobetasol cream (TEMOVATE) 3.79 % Apply 1 application topically at bedtime as needed (skin irritation/ finger).    [name] levothyroxine (SYNTHROID, LEVOTHROID) 50 MCG tablet Take 1.5 tablets (75 mcg total) by mouth daily before breakfast. 126 tablet 0  . pilocarpine (SALAGEN) 5 MG tablet Take 5 mg by mouth See admin instructions. Take 1 tablet (5 mg) by mouth 3 times daily - early morning, midday and bedtime, may take an additional tablet during the day as needed for dry mouth.    . venlafaxine XR (EFFEXOR-XR) 75 MG 24 hr capsule TAKE ONE CAPSULE BY MOUTH AT BEDTIME 90 capsule 1   No current facility-administered medications for this visit.     Review of Systems: Review of Systems  HENT:   Positive for trouble swallowing.   Respiratory: Positive for cough and shortness of breath.   All other systems reviewed and are negative.    PHYSICAL EXAMINATION Blood pressure 124/79, pulse 89, temperature 97.7 F (36.5 C), temperature source Oral, resp. rate 18, weight 152 lb 12.8 oz (69.3 kg), SpO2 95 %.  ECOG PERFORMANCE STATUS: 2 - Symptomatic, <50% confined to bed  Physical Exam  Constitutional: She is oriented to person, place, and time and well-developed, well-nourished, and in no distress. No distress.  HENT:  Head: Normocephalic and atraumatic.  Mouth/Throat: Oropharynx is clear and moist. No oropharyngeal exudate.  Eyes: Conjunctivae and EOM are normal. Pupils are equal, round, and reactive to light. No scleral icterus.  Neck: No thyromegaly present.  Right neck with postoperative well-healed scar with a soft irregular density underneath the scar not present on the contralateral side.  Cardiovascular: Normal rate, regular rhythm and normal heart  sounds.  No murmur heard. Pulmonary/Chest: Effort normal and breath sounds normal. No respiratory distress. She has no wheezes. She has no rales.  Abdominal: Soft. Bowel sounds are normal. She exhibits no distension and no mass. There is no tenderness. There is no guarding.  Musculoskeletal: She exhibits no edema.  Lymphadenopathy:    She has no cervical adenopathy.  Neurological: She is alert and oriented to person, place, and time. She has normal reflexes. No cranial nerve deficit.  Skin: Skin is warm and dry. No rash noted. She is not diaphoretic. No erythema. No pallor.     LABORATORY DATA: I have personally reviewed the data as listed: No visits with results within 1 Week(s) from this visit.  Latest known visit with results is:  Appointment on 12/29/2017  Component Date Value Ref Range Status  . WBC Count 12/29/2017 5.7  3.9 - 10.3 K/uL Final  . RBC 12/29/2017 4.52  3.70 - 5.45 MIL/uL Final  . Hemoglobin 12/29/2017 13.6  11.6 - 15.9 g/dL Final  . HCT 12/29/2017 41.3  34.8 - 46.6 % Final  . MCV 12/29/2017 91.3  79.5 - 101.0 fL Final  . MCH 12/29/2017 30.1  25.1 - 34.0 pg Final  . MCHC 12/29/2017 33.0  31.5 - 36.0 g/dL Final  . RDW 12/29/2017 13.9  11.2 - 14.5 % Final  . Platelet Count 12/29/2017 278  145 - 400 K/uL Final  . Neutrophils Relative % 12/29/2017 74  % Final  . Neutro Abs 12/29/2017 4.2  1.5 - 6.5 K/uL Final  . Lymphocytes Relative 12/29/2017 19  % Final  . Lymphs Abs 12/29/2017 1.1  0.9 - 3.3 K/uL Final  . Monocytes Relative 12/29/2017 5  % Final  . Monocytes Absolute 12/29/2017 0.3  0.1 - 0.9 K/uL Final  . Eosinophils Relative 12/29/2017 1  % Final  . Eosinophils Absolute 12/29/2017 0.1  0.0 - 0.5 K/uL Final  . Basophils Relative 12/29/2017 1  % Final  . Basophils Absolute 12/29/2017 0.0  0.0 - 0.1 K/uL Final   Performed at [facility], [place] [address], [place], [place] [address]  . Sodium 12/29/2017 141  136 - 145 mmol/L Final  .  Potassium 12/29/2017 4.5  3.5 - 5.1 mmol/L Final  . Chloride 12/29/2017 101  98 - 109 mmol/L Final  . CO2 12/29/2017 29  22 - 29 mmol/L Final  . Glucose, Bld 12/29/2017 91  70 - 140 mg/dL Final  . BUN 12/29/2017 18  7 - 26 mg/dL Final  . Creatinine 12/29/2017 0.93  0.60 - 1.10 mg/dL Final  . Calcium 12/29/2017 10.3  8.4 - 10.4 mg/dL Final  . Total Protein 12/29/2017 7.7  6.4 - 8.3 g/dL Final  . Albumin 12/29/2017 4.4  3.5 - 5.0 g/dL Final  . AST 12/29/2017 20  5 - 34 U/L Final  . ALT 12/29/2017 14  0 - 55 U/L Final  . Alkaline Phosphatase 12/29/2017 95  40 - 150 U/L Final  . Total Bilirubin 12/29/2017 0.5  0.2 - 1.2 mg/dL Final  . GFR, Est Non Af Am 12/29/2017 >60  >60 mL/min Final  . GFR, Est AFR Am 12/29/2017 >60  >60 mL/min Final   Comment: (NOTE) The eGFR has been calculated using the CKD EPI equation. This calculation has not been validated in all clinical situations. eGFR's persistently <60 mL/min signify possible Chronic Kidney Disease.   Georgiann Hahn gap 12/29/2017 11  3 - 11 Final   Performed at [facility], [place] [address] [place], [place] [address]  . LDH 12/29/2017 189  125 - 245 U/L Final   Performed at [facility], [place] [address] [place], [place] [address]         Ardath Sax, MD

## 2018-01-12 NOTE — Assessment & Plan Note (Signed)
66 y.o. with recurrent thromboembolic disease including at least 2 separate events of acute pulmonary emboli in March 2017 and January 2019.  On additional history review, the initial thrombotic event likely occurred in 1994 with superficial venous thrombosis or possible DVT in the right lower extremity following an prolonged car travel.  At that time, no diagnostic evaluation or therapy was administered.  Currently, patient is on adequate anticoagulation, but continues to suffer from significant dyspnea on exertion and continues to require oxygen.  My suspicion is that she has been suffering from chronic pulmonary emboli as suggested by the CTA of the chest obtained in January 2018 demonstrating persistent filling defects.  In this situation, she likely has persistent pulmonary hypertension administering the function of the right ventricle and leading to the dyspnea on exertion.  No progression of the clots was noted in any of the previously administered anticoagulants and thus change in the blood thinner is not necessary at this period in time.  Hypercoagulable panel negative for additional contributing factors to explain recurrent thromboembolism.  Plan: -Continue apixaban, recommend indefinite anticoagulation based on severity of the recurrent VTE. -Consult cardiology, congestive heart failure service to evaluate patient for chronic pulmonary hypertension with possible invasive pulmonary artery pressure measurement. -Proceed with echocardiogram. -Return to my clinic in 6 months for clinical monitoring.

## 2018-01-13 ENCOUNTER — Other Ambulatory Visit: Payer: Self-pay | Admitting: Otolaryngology

## 2018-01-13 DIAGNOSIS — I2699 Other pulmonary embolism without acute cor pulmonale: Secondary | ICD-10-CM

## 2018-01-13 DIAGNOSIS — J449 Chronic obstructive pulmonary disease, unspecified: Secondary | ICD-10-CM | POA: Diagnosis not present

## 2018-01-23 ENCOUNTER — Other Ambulatory Visit: Payer: Self-pay | Admitting: Otolaryngology

## 2018-01-23 DIAGNOSIS — R1314 Dysphagia, pharyngoesophageal phase: Secondary | ICD-10-CM

## 2018-01-23 DIAGNOSIS — C099 Malignant neoplasm of tonsil, unspecified: Secondary | ICD-10-CM

## 2018-01-26 ENCOUNTER — Ambulatory Visit
Admission: RE | Admit: 2018-01-26 | Discharge: 2018-01-26 | Disposition: A | Discharge status: Home / Self Care | Payer: PPO | Source: Ambulatory Visit | Attending: Otolaryngology | Admitting: Otolaryngology

## 2018-01-26 DIAGNOSIS — R1310 Dysphagia, unspecified: Secondary | ICD-10-CM | POA: Diagnosis not present

## 2018-01-26 DIAGNOSIS — C099 Malignant neoplasm of tonsil, unspecified: Secondary | ICD-10-CM

## 2018-01-26 DIAGNOSIS — R1314 Dysphagia, pharyngoesophageal phase: Secondary | ICD-10-CM

## 2018-02-04 ENCOUNTER — Encounter: Payer: Self-pay | Admitting: Cardiovascular Disease

## 2018-02-04 ENCOUNTER — Ambulatory Visit: Payer: PPO | Admitting: Cardiovascular Disease

## 2018-02-04 VITALS — BP 120/78 | HR 88 | Ht 69.0 in | Wt 154.6 lb

## 2018-02-04 DIAGNOSIS — I2609 Other pulmonary embolism with acute cor pulmonale: Secondary | ICD-10-CM | POA: Diagnosis not present

## 2018-02-04 NOTE — Patient Instructions (Signed)
Medication Instructions: Your physician recommends that you continue on your current medications as directed. Please refer to the Current Medication list given to you today.   Follow-Up: You have been referred to Dr. Haroldine Laws to evaluate Chronic Pulmonary Embolism.  Your physician wants you to follow-up in: 6 months with Dr. Gwenlyn Found. You will receive a reminder letter in the mail two months in advance. If you don't receive a letter, please call our office to schedule the follow-up appointment.  If you need a refill on your cardiac medications before your next appointment, please call your pharmacy.

## 2018-02-04 NOTE — Addendum Note (Signed)
Addended by: Therisa Doyne on: 02/04/2018 02:22 PM   Modules accepted: Orders

## 2018-02-04 NOTE — Assessment & Plan Note (Signed)
Danielle Pratt  was referred to me by Dr. Lebron Conners for evaluation of chronic pulmonary thromboembolic disease. Her first PE was in 1994. Her second PE was in 2017 in association with decreased mobility due to a brace applied for dislocation of that an implanted hip. Her final pulmonary embolism was in January of this year that showed an increased pulmonary embolism load compared to her prior CT. She was not on prolonged anticoagulation after her first and second event but now is on Elavil S. She isn't chronic O2 2 L to keep her saturations greater than 90%. A 2-D echocardiogram performed 12/25/17 revealed an EF of 50-55% with a mildly dilated and hypokinetic right ventricle no mention of pulmonary hypertension. She does get mildly short of breath with exertion. She is fairly active.she has had DVT in the past but not during this admission. Apparently she has been worked up for thrombophilia There is no mention of paraneoplastic phenomenon related to occult malignancy. At this point, there are no other medical treatments for what she has. She may be a candidate for pulmonary thromboembolectomy at a tertiary referral center. I'm referring her to Dr. Pierre Bali for further evaluation and potential referral.

## 2018-02-04 NOTE — Progress Notes (Signed)
02/04/2018 [name]   04-11-1951  [id number]  Primary Physician Quay Burow, Claudina Lick, MD Primary Cardiologist: Lorretta Harp MD [name], [place]  HPI:  Danielle Pratt is a 67 y.o. thin and fit-appearing married Caucasian female mother of 2, grandmother and 2 grandchildren who is accompanied by her husband Rett Saslow. He was referred by Dr. Lebron Conners , hematology oncology, for evaluation and treatment of chronic pulmonary thromboembolic disease.she has no cardiac risk factors. She had her first episode of pulmonary emboli back in 1994 and her second episode March 2017 that it occurred in association with decreased mobility due to a brace applied for dislocation of an implanted hip. She was on oral anticoagulation for 6 months after that. She did have tibial DVT as well. She had recurrent shortness of breath and recent admission in January demonstrating increased pulmonary embolic load although her RV LV ratio did not warrant local thrombolytic therapy. She is on chronic O2 to keep recess greater than 90%. Should she has mild limitations regarding dyspnea. 2-D echo revealed normal LV systolic function, mildly dilated and hypokinetic right ventricle but her pulmonary pressures were not elevated. She is on Eliquis at this time. Venous Dopplers were negative. She apparently has undergone a thrombophilia workup as well.  Current Meds  Medication Sig  . apixaban (ELIQUIS) 5 MG TABS tablet Take 1 tablet (5 mg total) by mouth 2 (two) times daily.  [name] aspirin EC 81 MG tablet Take 1 tablet (81 mg total) by mouth daily. (Patient taking differently: Take 81 mg by mouth at bedtime. )  . Cholecalciferol 2000 units TABS Take 4,000 Units by mouth daily.  . clobetasol cream (TEMOVATE) 3.88 % Apply 1 application topically at bedtime as needed (skin irritation/ finger).  [name] levothyroxine (SYNTHROID, LEVOTHROID) 50 MCG tablet Take 1.5 tablets (75 mcg total) by mouth daily before breakfast.  . pilocarpine  (SALAGEN) 5 MG tablet Take 5 mg by mouth See admin instructions. Take 1 tablet (5 mg) by mouth 3 times daily - early morning, midday and bedtime, may take an additional tablet during the day as needed for dry mouth.  . venlafaxine XR (EFFEXOR-XR) 75 MG 24 hr capsule TAKE ONE CAPSULE BY MOUTH AT BEDTIME     No Known Allergies  Social History   Socioeconomic History  . Marital status: Married    Spouse name: Not on file  . Number of children: Not on file  . Years of education: Not on file  . Highest education level: Not on file  Occupational History  . Not on file  Social Needs  . Financial resource strain: Not on file  . Food insecurity:    Worry: Not on file    Inability: Not on file  . Transportation needs:    Medical: Not on file    Non-medical: Not on file  Tobacco Use  . Smoking status: Never Smoker  . Smokeless tobacco: Never Used  Substance and Sexual Activity  . Alcohol use: Yes    Alcohol/week: 1.8 oz    Types: 3 Cans of beer per week  . Drug use: No  . Sexual activity: Yes  Lifestyle  . Physical activity:    Days per week: Not on file    Minutes per session: Not on file  . Stress: Not on file  Relationships  . Social connections:    Talks on phone: Not on file    Gets together: Not on file    Attends religious service: Not  on file    Active member of club or organization: Not on file    Attends meetings of clubs or organizations: Not on file    Relationship status: Not on file  . Intimate partner violence:    Fear of current or ex partner: Not on file    Emotionally abused: Not on file    Physically abused: Not on file    Forced sexual activity: Not on file  Other Topics Concern  . Not on file  Social History Narrative  . Not on file     Review of Systems: General: negative for chills, fever, night sweats or weight changes.  Cardiovascular: negative for chest pain, dyspnea on exertion, edema, orthopnea, palpitations, paroxysmal nocturnal dyspnea or  shortness of breath Dermatological: negative for rash Respiratory: negative for cough or wheezing Urologic: negative for hematuria Abdominal: negative for nausea, vomiting, diarrhea, bright red blood per rectum, melena, or hematemesis Neurologic: negative for visual changes, syncope, or dizziness All other systems reviewed and are otherwise negative except as noted above.    Blood pressure 120/78, pulse 88, height 5\' 9"  (1.753 m), weight 154 lb 9.6 oz (70.1 kg).  General appearance: alert and no distress Neck: no adenopathy, no carotid bruit, no JVD, supple, symmetrical, trachea midline and thyroid not enlarged, symmetric, no tenderness/mass/nodules Lungs: clear to auscultation bilaterally Heart: regular rate and rhythm, S1, S2 normal, no murmur, click, rub or gallop Extremities: extremities normal, atraumatic, no cyanosis or edema Pulses: 2+ and symmetric Skin: Skin color, texture, turgor normal. No rashes or lesions Neurologic: Alert and oriented X 3, normal strength and tone. Normal symmetric reflexes. Normal coordination and gait  EKG not performed today.  ASSESSMENT AND PLAN:   Pulmonary emboli ([place]) Ms. Arts  was referred to me by Dr. Lebron Conners for evaluation of chronic pulmonary thromboembolic disease. Her first PE was in 1994. Her second PE was in 2017 in association with decreased mobility due to a brace applied for dislocation of that an implanted hip. Her final pulmonary embolism was in January of this year that showed an increased pulmonary embolism load compared to her prior CT. She was not on prolonged anticoagulation after her first and second event but now is on Elavil S. She isn't chronic O2 2 L to keep her saturations greater than 90%. A 2-D echocardiogram performed 12/25/17 revealed an EF of 50-55% with a mildly dilated and hypokinetic right ventricle no mention of pulmonary hypertension. She does get mildly short of breath with exertion. She is fairly active.she has had DVT  in the past but not during this admission. Apparently she has been worked up for thrombophilia There is no mention of paraneoplastic phenomenon related to occult malignancy. At this point, there are no other medical treatments for what she has. She may be a candidate for pulmonary thromboembolectomy at a tertiary referral center. I'm referring her to Dr. Pierre Bali for further evaluation and potential referral.      Lorretta Harp MD [facility], [facility] 02/04/2018 1:09 PM

## 2018-02-04 NOTE — Addendum Note (Signed)
Addended by: Therisa Doyne on: 02/04/2018 04:26 PM   Modules accepted: Orders

## 2018-02-05 ENCOUNTER — Telehealth: Payer: Self-pay | Admitting: Cardiovascular Disease

## 2018-02-05 NOTE — Telephone Encounter (Signed)
Returned call to patient who states she has already looked up a VQ scan online and is aware of what to expect. She has no further questions.

## 2018-02-05 NOTE — Telephone Encounter (Signed)
Per Staff Message, I set this patient up for a VQ scan.  I called pt to give her the time and location.  SHe didn't know it was being scheduled. Said Dr. Gwenlyn Found was referring her to Dr. Haroldine Laws. Per staff message both doctors were referring her.  I was not able to tell patient what a VQ Scan is.  She is fine with having it done but requested that a nurse call her today to explain.

## 2018-02-10 ENCOUNTER — Other Ambulatory Visit (HOSPITAL_COMMUNITY): Payer: PPO

## 2018-02-10 ENCOUNTER — Encounter (HOSPITAL_COMMUNITY): Admission: RE | Admit: 2018-02-10 | Payer: PPO | Source: Ambulatory Visit

## 2018-02-13 ENCOUNTER — Encounter (HOSPITAL_COMMUNITY)
Admission: RE | Admit: 2018-02-13 | Discharge: 2018-02-13 | Disposition: A | Discharge status: Home / Self Care | Payer: PPO | Source: Ambulatory Visit | Attending: Cardiovascular Disease | Admitting: Cardiovascular Disease

## 2018-02-13 ENCOUNTER — Ambulatory Visit (HOSPITAL_COMMUNITY): Payer: PPO

## 2018-02-13 ENCOUNTER — Ambulatory Visit (HOSPITAL_COMMUNITY)
Admission: RE | Admit: 2018-02-13 | Discharge: 2018-02-13 | Disposition: A | Discharge status: Home / Self Care | Payer: PPO | Source: Ambulatory Visit | Attending: Cardiovascular Disease | Admitting: Cardiovascular Disease

## 2018-02-13 DIAGNOSIS — I2609 Other pulmonary embolism with acute cor pulmonale: Secondary | ICD-10-CM

## 2018-02-13 DIAGNOSIS — R0602 Shortness of breath: Secondary | ICD-10-CM | POA: Diagnosis not present

## 2018-02-13 DIAGNOSIS — J449 Chronic obstructive pulmonary disease, unspecified: Secondary | ICD-10-CM | POA: Diagnosis not present

## 2018-02-13 MED ORDER — TECHNETIUM TO 99M ALBUMIN AGGREGATED
4.0000 | Freq: Once | INTRAVENOUS | Status: AC | PRN
  Administered 2018-02-13: 4 via INTRAVENOUS

## 2018-02-13 MED ORDER — TECHNETIUM TC 99M DIETHYLENETRIAME-PENTAACETIC ACID
32.0000 | Freq: Once | INTRAVENOUS | Status: AC | PRN
Start: 2018-02-13 — End: 2018-02-13
  Administered 2018-02-13: 32 via RESPIRATORY_TRACT

## 2018-02-17 ENCOUNTER — Ambulatory Visit (INDEPENDENT_AMBULATORY_CARE_PROVIDER_SITE_OTHER): Payer: PPO | Admitting: Family Medicine

## 2018-02-17 ENCOUNTER — Encounter: Payer: Self-pay | Admitting: Family Medicine

## 2018-02-17 ENCOUNTER — Other Ambulatory Visit (INDEPENDENT_AMBULATORY_CARE_PROVIDER_SITE_OTHER): Payer: PPO

## 2018-02-17 ENCOUNTER — Telehealth: Payer: Self-pay | Admitting: Cardiovascular Disease

## 2018-02-17 ENCOUNTER — Encounter: Payer: Self-pay | Admitting: Internal Medicine

## 2018-02-17 VITALS — BP 128/80 | HR 95 | Temp 97.6°F | Ht 69.0 in | Wt 154.1 lb

## 2018-02-17 DIAGNOSIS — M79605 Pain in left leg: Secondary | ICD-10-CM

## 2018-02-17 DIAGNOSIS — E039 Hypothyroidism, unspecified: Secondary | ICD-10-CM

## 2018-02-17 LAB — TSH: TSH: 3.42 u[IU]/mL (ref 0.35–4.50)

## 2018-02-17 MED ORDER — DOXYCYCLINE HYCLATE 100 MG PO TABS: 100.0000 mg | ORAL_TABLET | Freq: Two times a day (BID) | ORAL | 0 refills | Status: DC

## 2018-02-17 NOTE — Telephone Encounter (Signed)
Spoke with Pt and made aware of results of VQ Scan along with Dr. Gwenlyn Found recommendation to see Dr. Haroldine Laws. Pt verbalized understanding.

## 2018-02-17 NOTE — Telephone Encounter (Signed)
New message     Patient states she received a call from the office for a test she had done

## 2018-02-17 NOTE — Progress Notes (Signed)
Danielle Pratt - 67 y.o. female MRN [id number]  Date of birth: 1951/11/11  SUBJECTIVE:  Including CC & ROS.  Chief Complaint  Patient presents with  . Swollen area on back of left leg    Spot on back of left leg, red, tender and warm to the touch    Danielle Pratt is a 67 y.o. female that is  Presenting with a color change on the back of her leg leg. She was hit on the back of her leg by a dog collar. This occurred a few days ago. She didn't notice the color change until she looked at her leg. She is now having pain. She denies any fever. She is having pain with walking. Having redness over the area.    Review of Systems  Constitutional: Negative for fever.  HENT: Negative for congestion.   Musculoskeletal: Negative for gait problem.  Skin: Positive for color change.  Neurological: Negative for weakness.  Hematological: Negative for adenopathy.  Psychiatric/Behavioral: Negative for agitation.    HISTORY: Past Medical, Surgical, Social, and Family History Reviewed & Updated per EMR.   Pertinent Historical Findings include:  Past Medical History:  Diagnosis Date  . Anxiety   . Arthritis    "hands" (11/12/2017)  . Cataract    extraction surgery  . DVT (deep venous thrombosis) ([place]) 01/2016   LLE  . Fasting hyperglycemia    110 in 01/2011;131 in 05/2007  . Hypothyroidism   . Popliteal cyst   . Pulmonary embolism ([place]) 2017; 11/11/2017   "the one in 2017 was due to LLE DVT; don't why I had the one 11/11/2017"  . Renal insufficiency   . Tonsillar cancer ([place]) 1998    Past Surgical History:  Procedure Laterality Date  . CESAREAN SECTION  1984   G 1 P 2  . COLONOSCOPY  2003   negative; Dr Henrene Pastor  . JOINT REPLACEMENT    . LYMPHADENECTOMY Right 1998   neck  . TONSILLECTOMY AND ADENOIDECTOMY Right 1998   R tonsilar malignancy; subsequent LN dissection negative  . TOTAL ABDOMINAL HYSTERECTOMY  1980s   TOTAL ABDOMINAL HYSTERECTOMY W/ BILATERAL SALPINGOOPHORECTOMY [SHX83];  fibroids; Dr. Nori Riis  . TOTAL HIP ARTHROPLASTY Bilateral 2007   Dr Maureen Ralphs, both hips 3 months apart    No Known Allergies  Family History  Problem Relation Age of Onset  . Hyperlipidemia Mother   . Hypertension Mother   . Ovarian cancer Mother   . Aneurysm Mother        ascending & descending aorta  . Heart attack Father 53  . Leukemia Paternal Grandmother   . Lung cancer Paternal Grandfather   . Diabetes Neg Hx   . Stroke Neg Hx   . Colon cancer Neg Hx   . Stomach cancer Neg Hx   . Rectal cancer Neg Hx      Social History   Socioeconomic History  . Marital status: Married    Spouse name: Not on file  . Number of children: Not on file  . Years of education: Not on file  . Highest education level: Not on file  Occupational History  . Not on file  Social Needs  . Financial resource strain: Not on file  . Food insecurity:    Worry: Not on file    Inability: Not on file  . Transportation needs:    Medical: Not on file    Non-medical: Not on file  Tobacco Use  . Smoking status: Never Smoker  . Smokeless tobacco:  Never Used  Substance and Sexual Activity  . Alcohol use: Yes    Alcohol/week: 1.8 oz    Types: 3 Cans of beer per week  . Drug use: No  . Sexual activity: Yes  Lifestyle  . Physical activity:    Days per week: Not on file    Minutes per session: Not on file  . Stress: Not on file  Relationships  . Social connections:    Talks on phone: Not on file    Gets together: Not on file    Attends religious service: Not on file    Active member of club or organization: Not on file    Attends meetings of clubs or organizations: Not on file    Relationship status: Not on file  . Intimate partner violence:    Fear of current or ex partner: Not on file    Emotionally abused: Not on file    Physically abused: Not on file    Forced sexual activity: Not on file  Other Topics Concern  . Not on file  Social History Narrative  . Not on file     PHYSICAL  EXAM:  VS: BP 128/80 (BP Location: Right Arm, Patient Position: Sitting, Cuff Size: Normal)   Pulse 95   Temp 97.6 F (36.4 C) (Oral)   Ht 5\' 9"  (1.753 m)   Wt 154 lb 1.9 oz (69.9 kg)   SpO2 96%   BMI 22.76 kg/m  Physical Exam Gen: NAD, alert, cooperative with exam, well-appearing ENT: normal lips, normal nasal mucosa,  Eye: normal EOM, normal conjunctiva and lids CV:  no edema, +2 pedal pulses   Resp: no accessory muscle use, non-labored,  Skin: no rashes, no areas of induration  Neuro: normal tone, normal sensation to touch Psych:  normal insight, alert and oriented MSK:  Left leg:  Color change on posterior aspect of lower leg  TTP of the midpoint of this area  No streaking  Normal ankle ROM  Normal achilles  Normal gait  Neurovascularly intact    Limited ultrasound: left leg :  No increase vascularity  Hypoechoic change to suggest a hematoma. Doesn't appear to be an abscess   Summary: hematoma   Ultrasound and interpretation by Clearance Coots, MD          ASSESSMENT & PLAN:   Left leg pain Appears she has an overlying cellulitis over the hematoma. Doesn't appear to be an abscess  - doxycycline  - advised to follow up later this week to monitor  - counseled on care for hematoma.

## 2018-02-17 NOTE — Patient Instructions (Signed)
The redness is concerning for infection.  Please take the antibiotics  Please follow up with me if your symptoms are not improving.

## 2018-02-18 ENCOUNTER — Ambulatory Visit: Payer: PPO | Admitting: Internal Medicine

## 2018-02-18 DIAGNOSIS — M79605 Pain in left leg: Secondary | ICD-10-CM | POA: Insufficient documentation

## 2018-02-18 NOTE — Assessment & Plan Note (Signed)
Appears she has an overlying cellulitis over the hematoma. Doesn't appear to be an abscess  - doxycycline  - advised to follow up later this week to monitor  - counseled on care for hematoma.

## 2018-02-20 ENCOUNTER — Telehealth: Payer: Self-pay | Admitting: Family Medicine

## 2018-02-20 NOTE — Telephone Encounter (Signed)
Patient stated her leg still has redness and tenderness. She has been taking doxycycline as instructed. States the area is still the same. Please advise.

## 2018-02-20 NOTE — Telephone Encounter (Signed)
Spoke with patient. Advised to continue doxy and monitor the area. Advised to follow up on Monday if still no improvement. May need to consider an [place].   Rosemarie Ax, MD [facility] 02/20/2018, 9:01 PM '

## 2018-02-20 NOTE — Telephone Encounter (Signed)
Copied from [place] [id number]. Topic: Quick Communication - See Telephone Encounter >> Feb 20, 2018  9:58 AM Margot Ables wrote: CRM for notification. See Telephone encounter for: 02/20/18.  Pt states that she has not had noticeable improvement with L leg since her OV with Dr. Raeford Razor 02/17/18. She said she was advised to notify him if not better by the end of the week. Please advise.

## 2018-02-24 DIAGNOSIS — H1045 Other chronic allergic conjunctivitis: Secondary | ICD-10-CM | POA: Diagnosis not present

## 2018-02-25 ENCOUNTER — Ambulatory Visit (INDEPENDENT_AMBULATORY_CARE_PROVIDER_SITE_OTHER): Payer: PPO

## 2018-02-25 ENCOUNTER — Encounter: Payer: Self-pay | Admitting: Family Medicine

## 2018-02-25 ENCOUNTER — Encounter (HOSPITAL_COMMUNITY): Payer: Self-pay | Admitting: *Deleted

## 2018-02-25 ENCOUNTER — Ambulatory Visit (HOSPITAL_COMMUNITY)
Admission: RE | Admit: 2018-02-25 | Discharge: 2018-02-25 | Disposition: A | Discharge status: Home / Self Care | Payer: PPO | Source: Ambulatory Visit | Attending: Internal Medicine | Admitting: Internal Medicine

## 2018-02-25 ENCOUNTER — Ambulatory Visit: Payer: Self-pay | Admitting: Family Medicine

## 2018-02-25 ENCOUNTER — Encounter (HOSPITAL_COMMUNITY): Payer: Self-pay | Admitting: Internal Medicine

## 2018-02-25 ENCOUNTER — Ambulatory Visit (INDEPENDENT_AMBULATORY_CARE_PROVIDER_SITE_OTHER): Payer: PPO | Admitting: Family Medicine

## 2018-02-25 ENCOUNTER — Other Ambulatory Visit: Payer: Self-pay

## 2018-02-25 VITALS — BP 122/64 | HR 76 | Temp 97.7°F | Ht 69.0 in | Wt 153.0 lb

## 2018-02-25 VITALS — BP 140/78 | HR 93 | Wt 152.0 lb

## 2018-02-25 DIAGNOSIS — Z85818 Personal history of malignant neoplasm of other sites of lip, oral cavity, and pharynx: Secondary | ICD-10-CM | POA: Insufficient documentation

## 2018-02-25 DIAGNOSIS — Z87891 Personal history of nicotine dependence: Secondary | ICD-10-CM | POA: Insufficient documentation

## 2018-02-25 DIAGNOSIS — I2699 Other pulmonary embolism without acute cor pulmonale: Secondary | ICD-10-CM

## 2018-02-25 DIAGNOSIS — Z79899 Other long term (current) drug therapy: Secondary | ICD-10-CM | POA: Insufficient documentation

## 2018-02-25 DIAGNOSIS — F419 Anxiety disorder, unspecified: Secondary | ICD-10-CM | POA: Diagnosis not present

## 2018-02-25 DIAGNOSIS — J9611 Chronic respiratory failure with hypoxia: Secondary | ICD-10-CM | POA: Insufficient documentation

## 2018-02-25 DIAGNOSIS — Z86718 Personal history of other venous thrombosis and embolism: Secondary | ICD-10-CM | POA: Insufficient documentation

## 2018-02-25 DIAGNOSIS — E039 Hypothyroidism, unspecified: Secondary | ICD-10-CM | POA: Insufficient documentation

## 2018-02-25 DIAGNOSIS — Z79890 Hormone replacement therapy (postmenopausal): Secondary | ICD-10-CM | POA: Insufficient documentation

## 2018-02-25 DIAGNOSIS — I2782 Chronic pulmonary embolism: Secondary | ICD-10-CM | POA: Diagnosis not present

## 2018-02-25 DIAGNOSIS — I272 Pulmonary hypertension, unspecified: Secondary | ICD-10-CM | POA: Insufficient documentation

## 2018-02-25 DIAGNOSIS — M79605 Pain in left leg: Secondary | ICD-10-CM | POA: Diagnosis not present

## 2018-02-25 DIAGNOSIS — Z7901 Long term (current) use of anticoagulants: Secondary | ICD-10-CM | POA: Diagnosis not present

## 2018-02-25 LAB — BASIC METABOLIC PANEL
ANION GAP: 9 (ref 5–15)
BUN: 20 mg/dL (ref 6–20)
CO2: 29 mmol/L (ref 22–32)
Calcium: 9.6 mg/dL (ref 8.9–10.3)
Chloride: 101 mmol/L (ref 101–111)
Creatinine, Ser: 0.97 mg/dL (ref 0.44–1.00)
GFR, EST NON AFRICAN AMERICAN: 60 mL/min — AB (ref >60)
Glucose, Bld: 88 mg/dL (ref 65–99)
POTASSIUM: 4.2 mmol/L (ref 3.5–5.1)
Sodium: 139 mmol/L (ref 135–145)

## 2018-02-25 LAB — CBC
HEMATOCRIT: 40.2 % (ref 36.0–46.0)
Hemoglobin: 13 g/dL (ref 12.0–15.0)
MCH: 30 pg (ref 26.0–34.0)
MCHC: 32.3 g/dL (ref 30.0–36.0)
MCV: 92.8 fL (ref 78.0–100.0)
PLATELETS: 252 10*3/uL (ref 150–400)
RBC: 4.33 MIL/uL (ref 3.87–5.11)
RDW: 13.7 % (ref 11.5–15.5)
WBC: 6.1 10*3/uL (ref 4.0–10.5)

## 2018-02-25 LAB — PROTIME-INR
INR: 1.15
Prothrombin Time: 14.6 seconds (ref 11.4–15.2)

## 2018-02-25 NOTE — H&P (View-Only) (Signed)
ADVANCED HF CLINIC CONSULT NOTE  02/25/2018 Danielle Pratt   11-Dec-1950  [id number]  Primary Physician Binnie Rail, MD Primary Cardiologist: Lorretta Harp MD [name], [place]  HPI:  Danielle Pratt is a 67 y.o. thin and fit-appearing woman referred by Dr. 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 She is here with her husband Danielle Pratt.   She has also been followed by Dr. Lebron Conners , hematology oncology, for evaluation and treatment of chronic pulmonary thromboembolic diseases.   Denies any known h/o cardiac disease. 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 Smoked a little bit as a teenager but none since.   She tells me she had as uperficial LLE clot in 1994. In March 2017 was diagnosed with a DVT and PE  that occurred in association with decreased mobility due to a brace applied for dislocation of an implanted hip. She was on oral anticoagulation for 6 months after that.   She had recurrent shortness of breath and fatigue in January 2019. Presented to ED and found to have bilateral segmental and subsegmental PE. No evidence of significant RV strain so not given thrombolytics. LE dopplers were negative. Started on Eliquis. Hypercoag w/u by Dr. Lebron Conners has been negative. She denies FHX of hypercoag disorders, PAH or CTEPH. No h/o CTD.   Recent 2-D echo revealed normal LV systolic function, mildly dilated and right ventricle with normal function (read as mildly HK but I disagree) but her pulmonary pressures were not elevated. Her IVC was small..Wears O2 regularly. Sats typically > 90% unless she exerts herself too much. Remains very active. Able to unload the car yesterday without problem, O2 will go into 70s if O2 off and exerting herself.   Studies personally reviewed  VQ scan  02/13/18 1. High probability study for pulmonary thromboembolism. Multiple bilateral segmental perfusion defects, on the left partly matched with ventilatory defects, and with no  corresponding chest radiographic abnormality.  Echo 2/19 LVEF 50-55% RV mildly dialted but systolic function I Feel is normal. Trivial TR. IVC ok   Review of Systems: [y] = yes, [ ]  = no    General: Weight gain [] ; Weight loss [ ] ; Anorexia [ ] ; Fatigue [y]; Fever [ ] ; Chills [ ] ; Weakness [name] ]   Cardiac: Chest pain/pressure [ ] ; Resting SOB [ ] ; Exertional SOB [y]; Orthopnea [ ] ; Pedal Edema [] ; Palpitations [ ] ; Syncope [ ] ; Presyncope [ ] ; Paroxysmal nocturnal dyspnea[ ]    Pulmonary: Cough [ ] ; Wheezing[ ] ; Hemoptysis[ ] ; Sputum [ ] ; Snoring [ ]    GI: Vomiting[ ] ; Dysphagia[ ] ; Melena[ ] ; Hematochezia [ ] ; Heartburn[ ] ; Abdominal pain [ ] ; Constipation [ ] ; Diarrhea [ ] ; BRBPR [ ]    GU: Hematuria[ ] ; Dysuria [ ] ; Nocturia[ ]   Vascular: Pain in legs with walking [ ] ; Pain in feet with lying flat [ ] ; Non-healing sores [ ] ; Stroke [ ] ; TIA [ ] ; Slurred speech [ ] ;   Neuro: Headaches[ ] ; Vertigo[ ] ; Seizures[ ] ; Paresthesias[ ] ;Blurred vision [ ] ; Diplopia [ ] ; Vision changes [ ]    Ortho/Skin: Arthritis [y]; Joint pain [y]; Muscle pain [ ] ; Joint swelling [ ] ; Back Pain [ ] ; Rash [ ]    Psych: Depression[ ] ; Anxiety[ ]    Heme: Bleeding problems [ ] ; Clotting disorders [name] ]; Anemia [ ]    Endocrine: Diabetes [ ] ; Thyroid dysfunction[ ]    Past Medical History:  Diagnosis Date  . Anxiety   . Arthritis    "  hands" (11/12/2017)  . Cataract    extraction surgery  . DVT (deep venous thrombosis) ([place]) 01/2016   LLE  . Fasting hyperglycemia    110 in 01/2011;131 in 05/2007  . Hypothyroidism   . Popliteal cyst   . Pulmonary embolism ([place]) 2017; 11/11/2017   "the one in 2017 was due to LLE DVT; don't why I had the one 11/11/2017"  . Renal insufficiency   . Tonsillar cancer ([place]) 1998     Current Meds  Medication Sig  . apixaban (ELIQUIS) 5 MG TABS tablet Take 1 tablet (5 mg total) by mouth 2 (two) times daily.  . Cholecalciferol 2000 units TABS Take 4,000 Units by mouth  daily.  . clobetasol cream (TEMOVATE) 1.44 % Apply 1 application topically at bedtime as needed (skin irritation/ finger).  [name] doxycycline (VIBRA-TABS) 100 MG tablet Take 1 tablet (100 mg total) by mouth 2 (two) times daily.  [name] levothyroxine (SYNTHROID, LEVOTHROID) 50 MCG tablet Take 1.5 tablets (75 mcg total) by mouth daily before breakfast.  . pilocarpine (SALAGEN) 5 MG tablet Take 5 mg by mouth See admin instructions. Take 1 tablet (5 mg) by mouth 3 times daily - early morning, midday and bedtime, may take an additional tablet during the day as needed for dry mouth.  . venlafaxine XR (EFFEXOR-XR) 75 MG 24 hr capsule TAKE ONE CAPSULE BY MOUTH AT BEDTIME     No Known Allergies  Social History   Socioeconomic History  . Marital status: Married    Spouse name: Not on file  . Number of children: Not on file  . Years of education: Not on file  . Highest education level: Not on file  Occupational History  . Not on file  Social Needs  . Financial resource strain: Not on file  . Food insecurity:    Worry: Not on file    Inability: Not on file  . Transportation needs:    Medical: Not on file    Non-medical: Not on file  Tobacco Use  . Smoking status: Never Smoker  . Smokeless tobacco: Never Used  Substance and Sexual Activity  . Alcohol use: Yes    Alcohol/week: 1.8 oz    Types: 3 Cans of beer per week  . Drug use: No  . Sexual activity: Yes  Lifestyle  . Physical activity:    Days per week: Not on file    Minutes per session: Not on file  . Stress: Not on file  Relationships  . Social connections:    Talks on phone: Not on file    Gets together: Not on file    Attends religious service: Not on file    Active member of club or organization: Not on file    Attends meetings of clubs or organizations: Not on file    Relationship status: Not on file  . Intimate partner violence:    Fear of current or ex partner: Not on file    Emotionally abused: Not on file    Physically  abused: Not on file    Forced sexual activity: Not on file  Other Topics Concern  . Not on file  Social History Narrative  . Not on file    Family History  Problem Relation Age of Onset  . Hyperlipidemia Mother   . Hypertension Mother   . Ovarian cancer Mother   . Aneurysm Mother        ascending & descending aorta  . Heart attack Father 25  . Leukemia  Paternal Grandmother   . Lung cancer Paternal Grandfather   . Diabetes Neg Hx   . Stroke Neg Hx   . Colon cancer Neg Hx   . Stomach cancer Neg Hx   . Rectal cancer Neg Hx     Physical exam  Blood pressure 140/78, pulse 93, weight 152 lb (68.9 kg), SpO2 91 %.  General:  Fit-appearing. No resp difficulty Wearing O2 HEENT: normal Neck: supple. no JVD. Carotids 2+ bilat; no bruits. No lymphadenopathy or thryomegaly appreciated. Cor: PMI nondisplaced. Regular rate & rhythm. No rubs, gallops or murmurs. Lungs: clear Abdomen: soft, nontender, nondistended. No hepatosplenomegaly. No bruits or masses. Good bowel sounds. Extremities: no cyanosis, clubbing, rash, edema Neuro: alert & orientedx3, cranial nerves grossly intact. moves all 4 extremities w/o difficulty. Affect pleasant   EKG not performed today.  ASSESSMENT AND PLAN:  1. Chronic pulmonary emboli with probable CTEPH 2. Chronic hypoxic respiratory failure due to #1   Echo and VQ scan reviewed personally with he and her husband. Has multiple significant perfusion defects that confirm the diagnosis of chronic PE. (That said she is less than 3 months out from her diagnose so there may be a small chance that she may have some degree of resolution and recannalization over the next few months.) This is accompanied by significant respiratory failure requiring supplemental O2. Nevertheless, there is minimal if any RV strain on echo and lack of significant TR jet prevents accurate measurement of pulmonary pressures.   We had a long talk about the causes of VTE and also the treatment  options for CTEPH. We discussed the next steps: 1. Plan RHC with pulmonary angiography 2. Check PFTs 3. Continue Eliquis and will likely need IVC filter 4. Based on results of [place] may need referral to [facility] for pulmonary thromboembolectomy. We also discussed that several of the [place] agents have shown benefit for patients who are not candidates for, or opt against, surgery   Glori Bickers, MD  11:33 PM   02/25/2018 12:08 PM

## 2018-02-25 NOTE — Assessment & Plan Note (Addendum)
Possible for infection with debris observed in [place]. No prior history of gout. Possible for hematoma.  - aspiration of area today  - will continue doxy - gram stain  - advised to follow up if having pain or redness doesn't improve.

## 2018-02-25 NOTE — Progress Notes (Signed)
ADVANCED HF CLINIC CONSULT NOTE  02/25/2018 Danielle Pratt   Apr 08, 1951  [id number]  Primary Physician Danielle Rail, MD Primary Cardiologist: Danielle Harp MD Danielle Pratt, [place]  HPI:  Danielle Pratt is a 67 y.o. thin and fit-appearing woman referred by Dr. Gwenlyn Pratt for further evaluation of CTEPH. She is here with her husband Danielle Pratt.   She has also been followed by Dr. Lebron Pratt , hematology oncology, for evaluation and treatment of chronic pulmonary thromboembolic diseases.   Denies any known h/o cardiac disease. 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 Smoked a little bit as a teenager but none since.   She tells me she had as uperficial LLE clot in 1994. In March 2017 was diagnosed with a DVT and PE  that occurred in association with decreased mobility due to a brace applied for dislocation of an implanted hip. She was on oral anticoagulation for 6 months after that.   She had recurrent shortness of breath and fatigue in January 2019. Presented to ED and Pratt to have bilateral segmental and subsegmental PE. No evidence of significant RV strain so not given thrombolytics. LE dopplers were negative. Started on Eliquis. Hypercoag w/u by Dr. Lebron Pratt has been negative. She denies FHX of hypercoag disorders, PAH or CTEPH. No h/o CTD.   Recent 2-D echo revealed normal LV systolic function, mildly dilated and right ventricle with normal function (read as mildly HK but I disagree) but her pulmonary pressures were not elevated. Her IVC was small..Wears O2 regularly. Sats typically > 90% unless she exerts herself too much. Remains very active. Able to unload the car yesterday without problem, O2 will go into 70s if O2 off and exerting herself.   Studies personally reviewed  VQ scan  02/13/18 1. High probability study for pulmonary thromboembolism. Multiple bilateral segmental perfusion defects, on the left partly matched with ventilatory defects, and with no  corresponding chest radiographic abnormality.  Echo 2/19 LVEF 50-55% RV mildly dialted but systolic function I Feel is normal. Trivial TR. IVC ok   Review of Systems: [y] = yes, [ ]  = no    General: Weight gain [] ; Weight loss [ ] ; Anorexia [ ] ; Fatigue [y]; Fever [ ] ; Chills [ ] ; Weakness [name] ]   Cardiac: Chest pain/pressure [ ] ; Resting SOB [ ] ; Exertional SOB [y]; Orthopnea [ ] ; Pedal Edema [] ; Palpitations [ ] ; Syncope [ ] ; Presyncope [ ] ; Paroxysmal nocturnal dyspnea[ ]    Pulmonary: Cough [ ] ; Wheezing[ ] ; Hemoptysis[ ] ; Sputum [ ] ; Snoring [ ]    GI: Vomiting[ ] ; Dysphagia[ ] ; Melena[ ] ; Hematochezia [ ] ; Heartburn[ ] ; Abdominal pain [ ] ; Constipation [ ] ; Diarrhea [ ] ; BRBPR [ ]    GU: Hematuria[ ] ; Dysuria [ ] ; Nocturia[ ]   Vascular: Pain in legs with walking [ ] ; Pain in feet with lying flat [ ] ; Non-healing sores [ ] ; Stroke [ ] ; TIA [ ] ; Slurred speech [ ] ;   Neuro: Headaches[ ] ; Vertigo[ ] ; Seizures[ ] ; Paresthesias[ ] ;Blurred vision [ ] ; Diplopia [ ] ; Vision changes [ ]    Ortho/Skin: Arthritis [y]; Joint pain [y]; Muscle pain [ ] ; Joint swelling [ ] ; Back Pain [ ] ; Rash [ ]    Psych: Depression[ ] ; Anxiety[ ]    Heme: Bleeding problems [ ] ; Clotting disorders [name] ]; Anemia [ ]    Endocrine: Diabetes [ ] ; Thyroid dysfunction[ ]    Past Medical History:  Diagnosis Date  . Anxiety   . Arthritis    "  hands" (11/12/2017)  . Cataract    extraction surgery  . DVT (deep venous thrombosis) ([place]) 01/2016   LLE  . Fasting hyperglycemia    110 in 01/2011;131 in 05/2007  . Hypothyroidism   . Popliteal cyst   . Pulmonary embolism ([place]) 2017; 11/11/2017   "the one in 2017 was due to LLE DVT; don't why I had the one 11/11/2017"  . Renal insufficiency   . Tonsillar cancer ([place]) 1998     Current Meds  Medication Sig  . apixaban (ELIQUIS) 5 MG TABS tablet Take 1 tablet (5 mg total) by mouth 2 (two) times daily.  . Cholecalciferol 2000 units TABS Take 4,000 Units by mouth  daily.  . clobetasol cream (TEMOVATE) 8.41 % Apply 1 application topically at bedtime as needed (skin irritation/ finger).  [name] doxycycline (VIBRA-TABS) 100 MG tablet Take 1 tablet (100 mg total) by mouth 2 (two) times daily.  [name] levothyroxine (SYNTHROID, LEVOTHROID) 50 MCG tablet Take 1.5 tablets (75 mcg total) by mouth daily before breakfast.  . pilocarpine (SALAGEN) 5 MG tablet Take 5 mg by mouth See admin instructions. Take 1 tablet (5 mg) by mouth 3 times daily - early morning, midday and bedtime, may take an additional tablet during the day as needed for dry mouth.  . venlafaxine XR (EFFEXOR-XR) 75 MG 24 hr capsule TAKE ONE CAPSULE BY MOUTH AT BEDTIME     No Known Allergies  Social History   Socioeconomic History  . Marital status: Married    Spouse name: Not on file  . Number of children: Not on file  . Years of education: Not on file  . Highest education level: Not on file  Occupational History  . Not on file  Social Needs  . Financial resource strain: Not on file  . Food insecurity:    Worry: Not on file    Inability: Not on file  . Transportation needs:    Medical: Not on file    Non-medical: Not on file  Tobacco Use  . Smoking status: Never Smoker  . Smokeless tobacco: Never Used  Substance and Sexual Activity  . Alcohol use: Yes    Alcohol/week: 1.8 oz    Types: 3 Cans of beer per week  . Drug use: No  . Sexual activity: Yes  Lifestyle  . Physical activity:    Days per week: Not on file    Minutes per session: Not on file  . Stress: Not on file  Relationships  . Social connections:    Talks on phone: Not on file    Gets together: Not on file    Attends religious service: Not on file    Active member of club or organization: Not on file    Attends meetings of clubs or organizations: Not on file    Relationship status: Not on file  . Intimate partner violence:    Fear of current or ex partner: Not on file    Emotionally abused: Not on file    Physically  abused: Not on file    Forced sexual activity: Not on file  Other Topics Concern  . Not on file  Social History Narrative  . Not on file    Family History  Problem Relation Age of Onset  . Hyperlipidemia Mother   . Hypertension Mother   . Ovarian cancer Mother   . Aneurysm Mother        ascending & descending aorta  . Heart attack Father 40  . Leukemia  Paternal Grandmother   . Lung cancer Paternal Grandfather   . Diabetes Neg Hx   . Stroke Neg Hx   . Colon cancer Neg Hx   . Stomach cancer Neg Hx   . Rectal cancer Neg Hx     Physical exam  Blood pressure 140/78, pulse 93, weight 152 lb (68.9 kg), SpO2 91 %.  General:  Fit-appearing. No resp difficulty Wearing O2 HEENT: normal Neck: supple. no JVD. Carotids 2+ bilat; no bruits. No lymphadenopathy or thryomegaly appreciated. Cor: PMI nondisplaced. Regular rate & rhythm. No rubs, gallops or murmurs. Lungs: clear Abdomen: soft, nontender, nondistended. No hepatosplenomegaly. No bruits or masses. Good bowel sounds. Extremities: no cyanosis, clubbing, rash, edema Neuro: alert & orientedx3, cranial nerves grossly intact. moves all 4 extremities w/o difficulty. Affect pleasant   EKG not performed today.  ASSESSMENT AND PLAN:  1. Chronic pulmonary emboli with probable CTEPH 2. Chronic hypoxic respiratory failure due to #1   Echo and VQ scan reviewed personally with he and her husband. Has multiple significant perfusion defects that confirm the diagnosis of chronic PE. (That said she is less than 3 months out from her diagnose so there may be a small chance that she may have some degree of resolution and recannalization over the next few months.) This is accompanied by significant respiratory failure requiring supplemental O2. Nevertheless, there is minimal if any RV strain on echo and lack of significant TR jet prevents accurate measurement of pulmonary pressures.   We had a long talk about the causes of VTE and also the treatment  options for CTEPH. We discussed the next steps: 1. Plan RHC with pulmonary angiography 2. Check PFTs 3. Continue Eliquis and will likely need IVC filter 4. Based on results of [place] may need referral to [facility] for pulmonary thromboembolectomy. We also discussed that several of the [place] agents have shown benefit for patients who are not candidates for, or opt against, surgery   Glori Bickers, MD  11:33 PM   02/25/2018 12:08 PM

## 2018-02-25 NOTE — Patient Instructions (Signed)
Please let me know if your symptoms don't improve.  Please let me know if your knee pain doesn't improve.  Try ice and compression for your knee.  Please try the exercise

## 2018-02-25 NOTE — Progress Notes (Addendum)
Danielle Pratt - 67 y.o. female MRN [id number]  Date of birth: 1951-03-20  SUBJECTIVE:  Including CC & ROS.  Chief Complaint  Patient presents with  . Follow-up    Danielle Pratt is a 67 y.o. female that is here today for left leg pain follow up. She is still taking Doxycycline. She feels redness has improved. Admits to swelling and tenderness. Denies pain when ambulating. Denies fevers. The areas was initially hit by a dog collar. The area is just medial to the midbelly of the achilles on the left leg.    Review of Systems  Constitutional: Negative for fever.  HENT: Negative for congestion.   Respiratory: Positive for shortness of breath.   Cardiovascular: Negative for chest pain.  Gastrointestinal: Negative for abdominal pain.  Musculoskeletal: Positive for joint swelling.  Skin: Positive for color change.  Neurological: Negative for weakness.  Hematological: Negative for adenopathy.  Psychiatric/Behavioral: Negative for agitation.    HISTORY: Past Medical, Surgical, Social, and Family History Reviewed & Updated per EMR.   Pertinent Historical Findings include:  Past Medical History:  Diagnosis Date  . Anxiety   . Arthritis    "hands" (11/12/2017)  . Cataract    extraction surgery  . DVT (deep venous thrombosis) ([place]) 01/2016   LLE  . Fasting hyperglycemia    110 in 01/2011;131 in 05/2007  . Hypothyroidism   . Popliteal cyst   . Pulmonary embolism ([place]) 2017; 11/11/2017   "the one in 2017 was due to LLE DVT; don't why I had the one 11/11/2017"  . Renal insufficiency   . Tonsillar cancer ([place]) 1998    Past Surgical History:  Procedure Laterality Date  . CESAREAN SECTION  1984   G 1 P 2  . COLONOSCOPY  2003   negative; Dr Henrene Pastor  . JOINT REPLACEMENT    . LYMPHADENECTOMY Right 1998   neck  . TONSILLECTOMY AND ADENOIDECTOMY Right 1998   R tonsilar malignancy; subsequent LN dissection negative  . TOTAL ABDOMINAL HYSTERECTOMY  1980s   TOTAL ABDOMINAL HYSTERECTOMY W/  BILATERAL SALPINGOOPHORECTOMY [SHX83]; fibroids; Dr. Nori Riis  . TOTAL HIP ARTHROPLASTY Bilateral 2007   Dr Maureen Ralphs, both hips 3 months apart    No Known Allergies  Family History  Problem Relation Age of Onset  . Hyperlipidemia Mother   . Hypertension Mother   . Ovarian cancer Mother   . Aneurysm Mother        ascending & descending aorta  . Heart attack Father 41  . Leukemia Paternal Grandmother   . Lung cancer Paternal Grandfather   . Diabetes Neg Hx   . Stroke Neg Hx   . Colon cancer Neg Hx   . Stomach cancer Neg Hx   . Rectal cancer Neg Hx      Social History   Socioeconomic History  . Marital status: Married    Spouse name: Not on file  . Number of children: Not on file  . Years of education: Not on file  . Highest education level: Not on file  Occupational History  . Not on file  Social Needs  . Financial resource strain: Not on file  . Food insecurity:    Worry: Not on file    Inability: Not on file  . Transportation needs:    Medical: Not on file    Non-medical: Not on file  Tobacco Use  . Smoking status: Never Smoker  . Smokeless tobacco: Never Used  Substance and Sexual Activity  . Alcohol use: Yes  Alcohol/week: 1.8 oz    Types: 3 Cans of beer per week  . Drug use: No  . Sexual activity: Yes  Lifestyle  . Physical activity:    Days per week: Not on file    Minutes per session: Not on file  . Stress: Not on file  Relationships  . Social connections:    Talks on phone: Not on file    Gets together: Not on file    Attends religious service: Not on file    Active member of club or organization: Not on file    Attends meetings of clubs or organizations: Not on file    Relationship status: Not on file  . Intimate partner violence:    Fear of current or ex partner: Not on file    Emotionally abused: Not on file    Physically abused: Not on file    Forced sexual activity: Not on file  Other Topics Concern  . Not on file  Social History  Narrative  . Not on file     PHYSICAL EXAM:  VS: BP 122/64 (BP Location: Left Arm, Patient Position: Sitting, Cuff Size: Normal)   Pulse 76   Temp 97.7 F (36.5 C) (Oral)   Ht 5\' 9"  (1.753 m)   Wt 153 lb (69.4 kg)   SpO2 96%   BMI 22.59 kg/m  Physical Exam Gen: NAD, alert, cooperative with exam, Nasal cannula in place.  ENT: normal lips, normal nasal mucosa,  Eye: normal EOM, normal conjunctiva and lids CV:  no edema, +2 pedal pulses   Resp: no accessory muscle use, non-labored,  Skin: areas of induration of the left lower leg posterior aspect, no streaking  Neuro: normal tone, normal sensation to touch Psych:  normal insight, alert and oriented MSK:  Left leg:  Redness on the posterior medial aspect of the lower leg occurring at midbelly of achilles  TTP of this area  No fluctuance  Achilles is intact  Neurovascularly intact   Limited ultrasound: left leg/left knee:  Left leg:  Small (.75 cm) collection left to the midbelly of the achilles  Possible for purulent material   Left knee:  Mild to moderate effusion in the SPP  No significant joint space narrowing medially or laterally Large Baker's cyst that extends distally and medially   Summary: Possible abscess vs gout collection in left lower leg. Effusion and Baker's cyst of left knee.   Ultrasound and interpretation by Clearance Coots, MD  Incision and Drainage Procedure Note:  The affected area was cleaned and draped in a sterile fashion. Anesthesia was achieved using 4 mL of 1% Lidocaine without epinephrine injected around the wound area using a 25-guage 1.5 inch needle. An 18-gauge needle was used aspirate the wound. A gram stain was obtained. A sterile dressing was applied to the area. The patient tolerated the procedure well. No complications were encountered. Ultrasound was used and image in long axis was taken.      ASSESSMENT & PLAN:   Left leg pain Possible for infection with debris observed in [place].  No prior history of gout. Possible for hematoma.  - aspiration of area today  - will continue doxy - gram stain  - advised to follow up if having pain or redness doesn't improve.

## 2018-02-25 NOTE — Patient Instructions (Signed)
Labs today (will call for abnormal results, otherwise no news is good news)  Pulmonary Function Test has been ordered for you, we will schedule at checkout.    You have been scheduled for a Right and Left Heart Cath, please see attached instruction sheet.  Follow up in 2 Months.

## 2018-02-26 ENCOUNTER — Telehealth (HOSPITAL_COMMUNITY): Payer: Self-pay | Admitting: Vascular Surgery

## 2018-02-26 NOTE — Telephone Encounter (Signed)
Left pt message to reschedule appt w/ db , due to DB not being in office 6/19

## 2018-03-02 ENCOUNTER — Encounter (HOSPITAL_COMMUNITY)
Admission: RE | Disposition: A | Discharge status: Home / Self Care | Payer: Self-pay | Source: Ambulatory Visit | Attending: Internal Medicine

## 2018-03-02 ENCOUNTER — Observation Stay (HOSPITAL_COMMUNITY)
Admission: RE | Admit: 2018-03-02 | Discharge: 2018-03-03 | Disposition: A | Discharge status: Home / Self Care | Payer: PPO | Source: Ambulatory Visit | Attending: Internal Medicine | Admitting: Internal Medicine

## 2018-03-02 ENCOUNTER — Encounter (HOSPITAL_COMMUNITY): Payer: Self-pay | Admitting: Internal Medicine

## 2018-03-02 DIAGNOSIS — Z85818 Personal history of malignant neoplasm of other sites of lip, oral cavity, and pharynx: Secondary | ICD-10-CM | POA: Diagnosis not present

## 2018-03-02 DIAGNOSIS — M19042 Primary osteoarthritis, left hand: Secondary | ICD-10-CM | POA: Diagnosis not present

## 2018-03-02 DIAGNOSIS — Z8041 Family history of malignant neoplasm of ovary: Secondary | ICD-10-CM | POA: Diagnosis not present

## 2018-03-02 DIAGNOSIS — Y658 Other specified misadventures during surgical and medical care: Secondary | ICD-10-CM | POA: Diagnosis not present

## 2018-03-02 DIAGNOSIS — E039 Hypothyroidism, unspecified: Secondary | ICD-10-CM | POA: Insufficient documentation

## 2018-03-02 DIAGNOSIS — I442 Atrioventricular block, complete: Secondary | ICD-10-CM | POA: Diagnosis not present

## 2018-03-02 DIAGNOSIS — Z8249 Family history of ischemic heart disease and other diseases of the circulatory system: Secondary | ICD-10-CM | POA: Insufficient documentation

## 2018-03-02 DIAGNOSIS — Z801 Family history of malignant neoplasm of trachea, bronchus and lung: Secondary | ICD-10-CM | POA: Insufficient documentation

## 2018-03-02 DIAGNOSIS — M19041 Primary osteoarthritis, right hand: Secondary | ICD-10-CM | POA: Diagnosis not present

## 2018-03-02 DIAGNOSIS — F419 Anxiety disorder, unspecified: Secondary | ICD-10-CM | POA: Insufficient documentation

## 2018-03-02 DIAGNOSIS — Z79899 Other long term (current) drug therapy: Secondary | ICD-10-CM | POA: Insufficient documentation

## 2018-03-02 DIAGNOSIS — I447 Left bundle-branch block, unspecified: Secondary | ICD-10-CM | POA: Insufficient documentation

## 2018-03-02 DIAGNOSIS — J9611 Chronic respiratory failure with hypoxia: Secondary | ICD-10-CM | POA: Insufficient documentation

## 2018-03-02 DIAGNOSIS — I97790 Other intraoperative cardiac functional disturbances during cardiac surgery: Secondary | ICD-10-CM | POA: Insufficient documentation

## 2018-03-02 DIAGNOSIS — Z86718 Personal history of other venous thrombosis and embolism: Secondary | ICD-10-CM | POA: Diagnosis not present

## 2018-03-02 DIAGNOSIS — I2724 Chronic thromboembolic pulmonary hypertension: Secondary | ICD-10-CM | POA: Diagnosis not present

## 2018-03-02 DIAGNOSIS — Z87891 Personal history of nicotine dependence: Secondary | ICD-10-CM | POA: Diagnosis not present

## 2018-03-02 DIAGNOSIS — I2782 Chronic pulmonary embolism: Secondary | ICD-10-CM | POA: Insufficient documentation

## 2018-03-02 DIAGNOSIS — N289 Disorder of kidney and ureter, unspecified: Secondary | ICD-10-CM | POA: Insufficient documentation

## 2018-03-02 DIAGNOSIS — E785 Hyperlipidemia, unspecified: Secondary | ICD-10-CM | POA: Insufficient documentation

## 2018-03-02 DIAGNOSIS — I272 Pulmonary hypertension, unspecified: Secondary | ICD-10-CM

## 2018-03-02 DIAGNOSIS — I2721 Secondary pulmonary arterial hypertension: Secondary | ICD-10-CM | POA: Diagnosis not present

## 2018-03-02 DIAGNOSIS — Z7901 Long term (current) use of anticoagulants: Secondary | ICD-10-CM | POA: Insufficient documentation

## 2018-03-02 HISTORY — PX: TEMPORARY PACEMAKER: CATH118268

## 2018-03-02 HISTORY — PX: RIGHT/LEFT HEART CATH AND CORONARY ANGIOGRAPHY: CATH118266

## 2018-03-02 LAB — POCT I-STAT 3, VENOUS BLOOD GAS (G3P V)
Acid-Base Excess: 1 mmol/L (ref 0.0–2.0)
Bicarbonate: 26.8 mmol/L (ref 20.0–28.0)
Bicarbonate: 27.8 mmol/L (ref 20.0–28.0)
O2 Saturation: 61 %
O2 Saturation: 63 %
PCO2 VEN: 54.5 mmHg (ref 44.0–60.0)
PH VEN: 7.316 (ref 7.250–7.430)
TCO2: 28 mmol/L (ref 22–32)
TCO2: 29 mmol/L (ref 22–32)
pCO2, Ven: 52.2 mmHg (ref 44.0–60.0)
pH, Ven: 7.319 (ref 7.250–7.430)
pO2, Ven: 35 mmHg (ref 32.0–45.0)
pO2, Ven: 36 mmHg (ref 32.0–45.0)

## 2018-03-02 LAB — POCT I-STAT 3, ART BLOOD GAS (G3+)
Acid-base deficit: 1 mmol/L (ref 0.0–2.0)
BICARBONATE: 25.5 mmol/L (ref 20.0–28.0)
O2 Saturation: 93 %
PH ART: 7.335 — AB (ref 7.350–7.450)
PO2 ART: 72 mmHg — AB (ref 83.0–108.0)
TCO2: 27 mmol/L (ref 22–32)
pCO2 arterial: 47.9 mmHg (ref 32.0–48.0)

## 2018-03-02 SURGERY — RIGHT/LEFT HEART CATH AND CORONARY ANGIOGRAPHY
Anesthesia: LOCAL

## 2018-03-02 MED ORDER — SODIUM CHLORIDE 0.9 % IV SOLN: 250.0000 mL | INTRAVENOUS | Status: DC | PRN

## 2018-03-02 MED ORDER — SODIUM CHLORIDE 0.9% FLUSH: 3.0000 mL | Freq: Two times a day (BID) | INTRAVENOUS | Status: DC

## 2018-03-02 MED ORDER — LIDOCAINE HCL (PF) 1 % IJ SOLN
INTRAMUSCULAR | Status: DC | PRN
  Administered 2018-03-02: 18 mL via INTRADERMAL

## 2018-03-02 MED ORDER — IOPAMIDOL (ISOVUE-370) INJECTION 76%
INTRAVENOUS | Status: AC
  Filled 2018-03-02: qty 100

## 2018-03-02 MED ORDER — SODIUM CHLORIDE 0.9 % IV SOLN: INTRAVENOUS | Status: AC

## 2018-03-02 MED ORDER — VENLAFAXINE HCL ER 75 MG PO CP24
75.0000 mg | ORAL_CAPSULE | Freq: Every day | ORAL | Status: DC
  Administered 2018-03-02: 75 mg via ORAL
  Filled 2018-03-02: qty 1

## 2018-03-02 MED ORDER — ACETAMINOPHEN 325 MG PO TABS: 650.0000 mg | ORAL_TABLET | ORAL | Status: DC | PRN

## 2018-03-02 MED ORDER — APIXABAN 5 MG PO TABS
5.0000 mg | ORAL_TABLET | Freq: Two times a day (BID) | ORAL | Status: DC
  Administered 2018-03-02 – 2018-03-03 (×2): 5 mg via ORAL
  Filled 2018-03-02 (×2): qty 1

## 2018-03-02 MED ORDER — IOPAMIDOL (ISOVUE-370) INJECTION 76%
INTRAVENOUS | Status: DC | PRN
  Administered 2018-03-02: 30 mL via INTRAVENOUS

## 2018-03-02 MED ORDER — SODIUM CHLORIDE 0.9% FLUSH: 3.0000 mL | INTRAVENOUS | Status: DC | PRN

## 2018-03-02 MED ORDER — PILOCARPINE HCL 5 MG PO TABS
5.0000 mg | ORAL_TABLET | Freq: Three times a day (TID) | ORAL | Status: DC
  Administered 2018-03-02 – 2018-03-03 (×2): 5 mg via ORAL
  Filled 2018-03-02 (×3): qty 1

## 2018-03-02 MED ORDER — ASPIRIN 81 MG PO CHEW
CHEWABLE_TABLET | ORAL | Status: AC
  Filled 2018-03-02: qty 1

## 2018-03-02 MED ORDER — MIDAZOLAM HCL 2 MG/2ML IJ SOLN
INTRAMUSCULAR | Status: AC
  Filled 2018-03-02: qty 2

## 2018-03-02 MED ORDER — ASPIRIN 81 MG PO CHEW
81.0000 mg | CHEWABLE_TABLET | ORAL | Status: AC
  Administered 2018-03-02: 81 mg via ORAL

## 2018-03-02 MED ORDER — LIDOCAINE HCL (PF) 1 % IJ SOLN
INTRAMUSCULAR | Status: AC
  Filled 2018-03-02: qty 30

## 2018-03-02 MED ORDER — HEPARIN (PORCINE) IN NACL 2-0.9 UNITS/ML
INTRAMUSCULAR | Status: AC | PRN
  Administered 2018-03-02 (×2): 500 mL

## 2018-03-02 MED ORDER — FENTANYL CITRATE (PF) 100 MCG/2ML IJ SOLN
INTRAMUSCULAR | Status: AC
  Filled 2018-03-02: qty 2

## 2018-03-02 MED ORDER — SODIUM CHLORIDE 0.9% FLUSH
3.0000 mL | Freq: Two times a day (BID) | INTRAVENOUS | Status: DC
  Administered 2018-03-02 – 2018-03-03 (×2): 3 mL via INTRAVENOUS

## 2018-03-02 MED ORDER — ONDANSETRON HCL 4 MG/2ML IJ SOLN: 4.0000 mg | Freq: Four times a day (QID) | INTRAMUSCULAR | Status: DC | PRN

## 2018-03-02 MED ORDER — SODIUM CHLORIDE 0.9 % IV SOLN
INTRAVENOUS | Status: DC
  Administered 2018-03-02: 06:00:00 via INTRAVENOUS

## 2018-03-02 MED ORDER — MIDAZOLAM HCL 2 MG/2ML IJ SOLN
INTRAMUSCULAR | Status: DC | PRN
  Administered 2018-03-02: 2 mg via INTRAVENOUS

## 2018-03-02 MED ORDER — FENTANYL CITRATE (PF) 100 MCG/2ML IJ SOLN
INTRAMUSCULAR | Status: DC | PRN
  Administered 2018-03-02: 25 ug via INTRAVENOUS

## 2018-03-02 SURGICAL SUPPLY — 14 items
CABLE ADAPT CONN TEMP 6FT (ADAPTER) ×2 IMPLANT
CATH INFINITI 5FR MULTPACK ANG (CATHETERS) ×2 IMPLANT
CATH S G BIP PACING (SET/KITS/TRAYS/PACK) ×2 IMPLANT
CATH SWAN GANZ 7F STRAIGHT (CATHETERS) ×2 IMPLANT
ELECT DEFIB PAD ADLT CADENCE (PAD) ×2 IMPLANT
KIT HEART LEFT (KITS) ×2 IMPLANT
PACK CARDIAC CATHETERIZATION (CUSTOM PROCEDURE TRAY) ×2 IMPLANT
SHEATH AVANTI 11CM 5FR (SHEATH) ×2 IMPLANT
SHEATH AVANTI 11CM 7FR (SHEATH) ×2 IMPLANT
SYR MEDRAD MARK V 150ML (SYRINGE) IMPLANT
TRANSDUCER W/STOPCOCK (MISCELLANEOUS) ×2 IMPLANT
TUBING CIL FLEX 10 FLL-RA (TUBING) ×2 IMPLANT
WIRE EMERALD 3MM-J .025X260CM (WIRE) ×2 IMPLANT
WIRE EMERALD 3MM-J .035X150CM (WIRE) ×4 IMPLANT

## 2018-03-02 NOTE — Progress Notes (Signed)
Patient is up, steady gait, ambulating in room without difficulty.  Placed on tele box for improved mobility.  Right groin site without complication - level zero.  Will continue to monitor.

## 2018-03-02 NOTE — Interval H&P Note (Signed)
History and Physical Interval Note:  03/02/2018 7:45 AM  Danielle Pratt  has presented today for surgery, with the diagnosis of hp  The various methods of treatment have been discussed with the patient and family. After consideration of risks, benefits and other options for treatment, the patient has consented to  Procedure(s): RIGHT/LEFT HEART CATH AND CORONARY ANGIOGRAPHY (N/A) and possible coronary angioplasty as a surgical intervention .  The patient's history has been reviewed, patient examined, no change in status, stable for surgery.  I have reviewed the patient's chart and labs.  Questions were answered to the patient's satisfaction.     Ladawna Walgren

## 2018-03-02 NOTE — H&P (Signed)
ADVANCED HF H&P   03/02/2018 Danielle Pratt   1951/06/30  [id number]  Primary Physician Binnie Rail, MD Primary Cardiologist: Lorretta Harp MD [name], [place], [place]  HPI:  Danielle Pratt is a 67 y.o. thin and fit-appearing woman with CTEPH admitted today post cath for observation after developing CHB intra-procedurally.   Cath with normal cors and very mild PAH. Had iLBBB on baseline ECG but complete LBBB on ECG from this am.   During manipulation of the pigtail in her RV she developed CHB with no ventricular escape. TVP wire placed immediately but patient recovered spontaneously. Admitted for observation.   Recent 2-D echo revealed normal LV systolic function, mildly dilated and right ventricle with normal function (read as mildly HK but I disagree) but her pulmonary pressures were not elevated. Her IVC was small..Wears O2 regularly. Sats typically > 90% unless she exerts herself too much. Remains very active. Able to unload the car yesterday without problem, O2 will go into 70s if O2 off and exerting herself.   Studies personally reviewed  VQ scan  02/13/18 1. High probability study for pulmonary thromboembolism. Multiple bilateral segmental perfusion defects, on the left partly matched with ventilatory defects, and with no corresponding chest radiographic abnormality.  Echo 2/19 LVEF 50-55% RV mildly dialted but systolic function I Feel is normal. Trivial TR. IVC ok   Review of Systems: [y] = yes, [ ]  = no    General: Weight gain [] ; Weight loss [ ] ; Anorexia [ ] ; Fatigue [y]; Fever [ ] ; Chills [ ] ; Weakness [name] ]   Cardiac: Chest pain/pressure [ ] ; Resting SOB [ ] ; Exertional SOB [y]; Orthopnea [ ] ; Pedal Edema [] ; Palpitations [ ] ; Syncope [ ] ; Presyncope [ ] ; Paroxysmal nocturnal dyspnea[ ]    Pulmonary: Cough [ ] ; Wheezing[ ] ; Hemoptysis[ ] ; Sputum [ ] ; Snoring [ ]    GI: Vomiting[ ] ; Dysphagia[ ] ; Melena[ ] ; Hematochezia [ ] ; Heartburn[ ] ; Abdominal  pain [ ] ; Constipation [ ] ; Diarrhea [ ] ; BRBPR [ ]    GU: Hematuria[ ] ; Dysuria [ ] ; Nocturia[ ]   Vascular: Pain in legs with walking [ ] ; Pain in feet with lying flat [ ] ; Non-healing sores [ ] ; Stroke [ ] ; TIA [ ] ; Slurred speech [ ] ;   Neuro: Headaches[ ] ; Vertigo[ ] ; Seizures[ ] ; Paresthesias[ ] ;Blurred vision [ ] ; Diplopia [ ] ; Vision changes [ ]    Ortho/Skin: Arthritis [y]; Joint pain [y]; Muscle pain [ ] ; Joint swelling [ ] ; Back Pain [ ] ; Rash [ ]    Psych: Depression[ ] ; Anxiety[ ]    Heme: Bleeding problems [ ] ; Clotting disorders [name] ]; Anemia [ ]    Endocrine: Diabetes [ ] ; Thyroid dysfunction[ ]    Past Medical History:  Diagnosis Date  . Anxiety   . Arthritis    "hands" (11/12/2017)  . Cataract    extraction surgery  . DVT (deep venous thrombosis) ([place]) 01/2016   LLE  . Fasting hyperglycemia    110 in 01/2011;131 in 05/2007  . Hypothyroidism   . Popliteal cyst   . Pulmonary embolism ([place]) 2017; 11/11/2017   "the one in 2017 was due to LLE DVT; don't why I had the one 11/11/2017"  . Renal insufficiency   . Tonsillar cancer ([place]) 1998     Current Meds  Medication Sig  . apixaban (ELIQUIS) 5 MG TABS tablet Take 1 tablet (5 mg total) by mouth 2 (two) times daily.  . Cholecalciferol 2000 units TABS Take 4,000 Units by mouth  daily.  . levothyroxine (SYNTHROID, LEVOTHROID) 50 MCG tablet Take 1.5 tablets (75 mcg total) by mouth daily before breakfast.  . OXYGEN Inhale 2 L into the lungs continuous.  . pilocarpine (SALAGEN) 5 MG tablet Take 5 mg by mouth See admin instructions. Take 1 tablet (5 mg) by mouth 3 times daily - early morning, midday and bedtime, may take an additional tablet during the day as needed for dry mouth.  . venlafaxine XR (EFFEXOR-XR) 75 MG 24 hr capsule TAKE ONE CAPSULE BY MOUTH AT BEDTIME (Patient taking differently: TAKE 75 MG BY MOUTH AT BEDTIME)     No Known Allergies  Social History   Socioeconomic History  . Marital status: Married     Spouse name: Not on file  . Number of children: Not on file  . Years of education: Not on file  . Highest education level: Not on file  Occupational History  . Not on file  Social Needs  . Financial resource strain: Not on file  . Food insecurity:    Worry: Not on file    Inability: Not on file  . Transportation needs:    Medical: Not on file    Non-medical: Not on file  Tobacco Use  . Smoking status: Never Smoker  . Smokeless tobacco: Never Used  Substance and Sexual Activity  . Alcohol use: Yes    Alcohol/week: 1.8 oz    Types: 3 Cans of beer per week  . Drug use: No  . Sexual activity: Yes  Lifestyle  . Physical activity:    Days per week: Not on file    Minutes per session: Not on file  . Stress: Not on file  Relationships  . Social connections:    Talks on phone: Not on file    Gets together: Not on file    Attends religious service: Not on file    Active member of club or organization: Not on file    Attends meetings of clubs or organizations: Not on file    Relationship status: Not on file  . Intimate partner violence:    Fear of current or ex partner: Not on file    Emotionally abused: Not on file    Physically abused: Not on file    Forced sexual activity: Not on file  Other Topics Concern  . Not on file  Social History Narrative  . Not on file    Family History  Problem Relation Age of Onset  . Hyperlipidemia Mother   . Hypertension Mother   . Ovarian cancer Mother   . Aneurysm Mother        ascending & descending aorta  . Heart attack Father 23  . Leukemia Paternal Grandmother   . Lung cancer Paternal Grandfather   . Diabetes Neg Hx   . Stroke Neg Hx   . Colon cancer Neg Hx   . Stomach cancer Neg Hx   . Rectal cancer Neg Hx     Physical exam  Blood pressure 134/87, pulse 79, temperature 98.1 F (36.7 C), temperature source Oral, resp. rate (!) 21, height 5\' 9"  (1.753 m), weight 70.3 kg (155 lb), SpO2 93 %.  General:  Well appearing. No resp  difficulty HEENT: normal Neck: supple. no JVD. Carotids 2+ bilat; no bruits. No lymphadenopathy or thryomegaly appreciated. Cor: PMI nondisplaced. Regular rate & rhythm. No rubs, gallops or murmurs. Lungs: clear Abdomen: soft, nontender, nondistended. No hepatosplenomegaly. No bruits or masses. Good bowel sounds. Extremities: no cyanosis, clubbing, rash,  edema R groin site ok Neuro: alert & orientedx3, cranial nerves grossly intact. moves all 4 extremities w/o difficulty. Affect pleasant   EKG NSR with LBBBB  ASSESSMENT AND PLAN:  1. Intra-procedural CHB with transient LOC. Now resolved.  2 Chronic pulmonary emboli with mildCTEPH 3. Mild PAH 4. LBBB    Will observe overnight. Check CT scan to for pulmonary angiography. Refer to [facility]. Resume Eliquis  Glori Bickers, MD  3:43 PM

## 2018-03-02 NOTE — Progress Notes (Signed)
Site area: Right groin a 5 french arterial and a 7 french venous sheaths were removed  Site Prior to Removal:  Level 0  Pressure Applied For 25 MINUTES    Bedrest Beginning at 0925am Manual:   Yes.    Patient Status During Pull:  stable  Post Pull Groin Site:  Level 0  Post Pull Instructions Given:  Yes.    Post Pull Pulses Present:  Yes.    Dressing Applied:  Yes.    Comments: VS remain stable

## 2018-03-03 ENCOUNTER — Telehealth: Payer: Self-pay | Admitting: *Deleted

## 2018-03-03 ENCOUNTER — Other Ambulatory Visit: Payer: Self-pay

## 2018-03-03 ENCOUNTER — Encounter (HOSPITAL_COMMUNITY): Payer: Self-pay | Admitting: *Deleted

## 2018-03-03 ENCOUNTER — Observation Stay (HOSPITAL_COMMUNITY): Payer: PPO

## 2018-03-03 DIAGNOSIS — I2724 Chronic thromboembolic pulmonary hypertension: Secondary | ICD-10-CM

## 2018-03-03 DIAGNOSIS — J9690 Respiratory failure, unspecified, unspecified whether with hypoxia or hypercapnia: Secondary | ICD-10-CM | POA: Diagnosis not present

## 2018-03-03 DIAGNOSIS — I2721 Secondary pulmonary arterial hypertension: Secondary | ICD-10-CM | POA: Diagnosis not present

## 2018-03-03 DIAGNOSIS — I442 Atrioventricular block, complete: Secondary | ICD-10-CM | POA: Diagnosis not present

## 2018-03-03 LAB — BASIC METABOLIC PANEL
Anion gap: 9 (ref 5–15)
BUN: 14 mg/dL (ref 6–20)
CHLORIDE: 103 mmol/L (ref 101–111)
CO2: 26 mmol/L (ref 22–32)
Calcium: 8.9 mg/dL (ref 8.9–10.3)
Creatinine, Ser: 0.84 mg/dL (ref 0.44–1.00)
Glucose, Bld: 91 mg/dL (ref 65–99)
Potassium: 4.1 mmol/L (ref 3.5–5.1)
SODIUM: 138 mmol/L (ref 135–145)

## 2018-03-03 MED ORDER — IOPAMIDOL (ISOVUE-370) INJECTION 76%
INTRAVENOUS | Status: AC
  Filled 2018-03-03: qty 100

## 2018-03-03 MED ORDER — IOPAMIDOL (ISOVUE-370) INJECTION 76%
100.0000 mL | Freq: Once | INTRAVENOUS | Status: AC | PRN
  Administered 2018-03-03: 55 mL via INTRAVENOUS

## 2018-03-03 NOTE — Telephone Encounter (Signed)
Error opening  

## 2018-03-03 NOTE — Care Management Obs Status (Signed)
[facility] NOTIFICATION   Patient Details  Name: Danielle Pratt MRN: [id number] Date of Birth: 1951-09-30   Medicare Observation Status Notification Given:  Yes    Maryclare Labrador, RN 03/03/2018, 12:05 PM

## 2018-03-03 NOTE — Telephone Encounter (Signed)
Pt was on TCM report admitted 03/02/18 for observation after Cath.

## 2018-03-03 NOTE — Discharge Summary (Addendum)
Advanced Heart Failure Discharge Note  Discharge Summary   Patient ID: Danielle Pratt MRN: [id number], DOB/AGE: 66-Jun-1952 66 y.o. Admit date: 03/02/2018 D/C date:     03/03/2018   Primary Discharge Diagnoses:  1. Intra-procedural CHB with transient LOC -> Resolved. 2 Chronic pulmonary emboli with mildCTEPH 3. Mild PAH 4. LBBB  Hospital Course:   Danielle Pratt is a 67 y.o. female with CTEPH admitted today post cath for observation after developing CHB intra-procedurally.   Cath 03/02/18 with normal cors and very mild PAH. Had iLBBB on baseline ECG but complete LBBB on ECG from am of procedure.    During manipulation of the pigtail in her RV she developed CHB with no ventricular escape. TVP wire placed immediately but patient recovered spontaneously. Admitted overnight for observation.   CTA chest performed am of 03/03/18 which showed "Stable appearance of probable chronic bilateral pulmonary emboli are noted. No new or acute pulmonary emboli are noted. RV LV ratio is measured at 1.2 suggesting right heart strain. No other abnormality seen in the chest.  Examined am of 03/03/18 and thought stable for discharge s/p CTA as above. She will keep f/u with Dr. Haroldine Laws in June, and will be seen in APP clinic for close follow up due to episode of CHB. She denies any symptoms am of 03/03/18, specifically, no SOB, lightheadedness, dizziness, or CP. No pauses on tele.   Physical Exam General: Well appearing. No resp difficulty. HEENT: Normal Neck: Supple. JVP 5-6. Carotids 2+ bilat; no bruits. No thyromegaly or nodule noted. Cor: PMI nondisplaced. RRR, No M/G/R noted Lungs: CTAB, normal effort. Abdomen: Soft, non-tender, non-distended, no HSM. No bruits or masses. +BS  Extremities: No cyanosis, clubbing, or rash. R and LLE no edema.  Neuro: Alert & orientedx3, cranial nerves grossly intact. moves all 4 extremities w/o difficulty. Affect pleasant   Discharge Weight Range: 155  lbs Discharge Vitals: Blood pressure 111/71, pulse 76, temperature 97.8 F (36.6 C), temperature source Oral, resp. rate 17, height 5\' 9"  (1.753 m), weight 155 lb (70.3 kg), SpO2 100 %.  Labs: Lab Results  Component Value Date   WBC 6.1 02/25/2018   HGB 13.0 02/25/2018   HCT 40.2 02/25/2018   MCV 92.8 02/25/2018   PLT 252 02/25/2018    Recent Labs  Lab 03/03/18 0324  NA 138  K 4.1  CL 103  CO2 26  BUN 14  CREATININE 0.84  CALCIUM 8.9  GLUCOSE 91   Lab Results  Component Value Date   CHOL 215 (H) 07/28/2015   HDL 82.90 07/28/2015   LDLCALC 115 (H) 07/28/2015   TRIG 85.0 07/28/2015   BNP (last 3 results) Recent Labs    11/11/17 1617  BNP 164.3*    ProBNP (last 3 results) No results for input(s): PROBNP in the last 8760 hours.   Diagnostic Studies/Procedures   Ct Angio Chest Pe W Or Wo Contrast  Result Date: 03/03/2018 CLINICAL DATA:  Chronic respiratory failure. Pulmonary hypertension. EXAM: CT ANGIOGRAPHY CHEST WITH CONTRAST TECHNIQUE: Multidetector CT imaging of the chest was performed using the standard protocol during bolus administration of intravenous contrast. Multiplanar CT image reconstructions and MIPs were obtained to evaluate the vascular anatomy. CONTRAST:  27mL ISOVUE-370 IOPAMIDOL (ISOVUE-370) INJECTION 76% COMPARISON:  Radiographs of February 13, 2018. CT scan of November 13, 2017. FINDINGS: Cardiovascular: There is stable appearance of filling defects seen in lower lobe branches of both pulmonary arteries consistent with chronic pulmonary emboli. No new or acute pulmonary emboli are noted.  No pericardial effusion is noted. RV/LV ratio is measured at 1.2 suggesting right heart strain. No thoracic aortic aneurysm is noted. Mediastinum/Nodes: No enlarged mediastinal, hilar, or axillary lymph nodes. Thyroid gland, trachea, and esophagus demonstrate no significant findings. Lungs/Pleura: Lungs are clear. No pleural effusion or pneumothorax. Upper Abdomen: No acute  abnormality. Musculoskeletal: No chest wall abnormality. No acute or significant osseous findings. Review of the MIP images confirms the above findings. IMPRESSION: Stable appearance of probable chronic bilateral pulmonary emboli are noted. No new or acute pulmonary emboli are noted. RV LV ratio is measured at 1.2 suggesting right heart strain. No other abnormality seen in the chest. Electronically Signed   By: Marijo Conception, M.D.   On: 03/03/2018 11:49    Discharge Medications   Allergies as of 03/03/2018   No Known Allergies     Medication List    TAKE these medications   apixaban 5 MG Tabs tablet Commonly known as:  ELIQUIS Take 1 tablet (5 mg total) by mouth 2 (two) times daily.   Cholecalciferol 2000 units Tabs Take 4,000 Units by mouth daily.   clobetasol cream 0.05 % Commonly known as:  TEMOVATE Apply 1 application topically at bedtime as needed (for skin irritation/finger).   levothyroxine 50 MCG tablet Commonly known as:  SYNTHROID, LEVOTHROID Take 1.5 tablets (75 mcg total) by mouth daily before breakfast.   OXYGEN Inhale 2 L into the lungs continuous.   pilocarpine 5 MG tablet Commonly known as:  SALAGEN Take 5 mg by mouth See admin instructions. Take 1 tablet (5 mg) by mouth 3 times daily - early morning, midday and bedtime, may take an additional tablet during the day as needed for dry mouth.   venlafaxine XR 75 MG 24 hr capsule Commonly known as:  EFFEXOR-XR TAKE ONE CAPSULE BY MOUTH AT BEDTIME What changed:    how much to take  how to take this  when to take this       Disposition   The patient will be discharged in stable condition to home. Discharge Instructions    (HEART FAILURE PATIENTS) Call MD:  Anytime you have any of the following symptoms: 1) 3 pound weight gain in 24 hours or 5 pounds in 1 week 2) shortness of breath, with or without a dry hacking cough 3) swelling in the hands, feet or stomach 4) if you have to sleep on extra pillows at  night in order to breathe.   Complete by:  As directed    Diet - low sodium heart healthy   Complete by:  As directed    Increase activity slowly   Complete by:  As directed    STOP any activity that causes chest pain, shortness of breath, dizziness, sweating, or exessive weakness   Complete by:  As directed      Follow-up Information    Shirley Friar, PA-C Follow up on 03/24/2018.   Specialty:  Physician Assistant Why:  Heart Failure Follow Up @ [facility]-11:30-Parking in ER lot (enter under blue awning to left of ER), or under [place] (construction entrance, garage code: 1200, elevator to 1st floor). Take all am meds, bring all med bottles. Contact information: [place] [address] [phone]             Duration of Discharge Encounter: Greater than 35 minutes   Signed, Annamaria Helling 03/03/2018, 12:55 PM   Patient seen and examined with the above-signed Advanced Practice Provider and/or  Housestaff. I personally reviewed laboratory data, imaging studies and relevant notes. I independently examined the patient and formulated the important aspects of the plan. I have edited the note to reflect any of my changes or salient points. I have personally discussed the plan with the patient and/or family.  Rhythm stable overnight. No further CHB. CT chest with stable PE burden. Have d/w [facility] and will refer for evaluation for thrombo-embolectomy.   [place] for d/c today.   Glori Bickers, MD  4:52 PM

## 2018-03-03 NOTE — Telephone Encounter (Signed)
CTA chest performed am of 03/03/18 which showed "Stable appearance of probable chronic bilateral pulmonary emboli are noted. No new or acute pulmonary emboli are noted. RV LV ratio is measured at 1.2 suggesting right heart strain. No other abnormality seen in the chest .Pt D/c 03/03/18 will be following up w/cardiology in 2-3 weeks. Per chart appt has been schedule for 03/24/18.Marland KitchenJohny Chess

## 2018-03-03 NOTE — Progress Notes (Signed)
Discharge instructions gone over with patient, patients stated she understood and did not have any questions.  IV's discontinued and patient is ready for discharge. Danielle Pratt 03/03/2018 1:32 PM

## 2018-03-03 NOTE — Care Management Note (Signed)
Case Management Note  Patient Details  Name: Danielle Pratt MRN: [id number] Date of Birth: 09-Nov-1951  Subjective/Objective:   Pt is s/p Cardia Cath                 Action/Plan:   PTA independent from home - family support when needed.  Pt is on continuous oxygen 2 L supplied by [employer].  Pt has portable oxygen tank for transport home.  Pt has PCP and denied barriers to obtaining and paying for medications as prescribed   Expected Discharge Date:                  Expected Discharge Plan:  Home/Self Care  In-House Referral:     Discharge planning Services  CM Consult  Post Acute Care Choice:    Choice offered to:     DME Arranged:    DME Agency:     HH Arranged:    HH Agency:     Status of Service:     If discussed at [employer], dates discussed:    Additional Comments:  Maryclare Labrador, RN 03/03/2018, 12:09 PM

## 2018-03-04 ENCOUNTER — Ambulatory Visit (HOSPITAL_COMMUNITY)
Admission: RE | Admit: 2018-03-04 | Discharge: 2018-03-04 | Disposition: A | Discharge status: Home / Self Care | Payer: PPO | Source: Ambulatory Visit | Attending: Internal Medicine | Admitting: Internal Medicine

## 2018-03-04 DIAGNOSIS — I272 Pulmonary hypertension, unspecified: Secondary | ICD-10-CM | POA: Diagnosis not present

## 2018-03-04 LAB — PULMONARY FUNCTION TEST
DL/VA % pred: 65 %
DL/VA: 3.51 ml/min/mmHg/L
DLCO unc % pred: 52 %
DLCO unc: 16.24 ml/min/mmHg
FEF 25-75 Post: 1.5 L/sec
FEF 25-75 Pre: 1.02 L/sec
FEF2575-%Change-Post: 46 %
FEF2575-%Pred-Post: 62 %
FEF2575-%Pred-Pre: 42 %
FEV1-%CHANGE-POST: 10 %
FEV1-%PRED-PRE: 64 %
FEV1-%Pred-Post: 71 %
FEV1-PRE: 1.88 L
FEV1-Post: 2.08 L
FEV1FVC-%Change-Post: 12 %
FEV1FVC-%Pred-Pre: 87 %
FEV6-%Change-Post: 1 %
FEV6-%PRED-PRE: 73 %
FEV6-%Pred-Post: 74 %
FEV6-POST: 2.71 L
FEV6-Pre: 2.66 L
FEV6FVC-%Change-Post: 3 %
FEV6FVC-%PRED-POST: 103 %
FEV6FVC-%Pred-Pre: 99 %
FVC-%Change-Post: -1 %
FVC-%Pred-Post: 72 %
FVC-%Pred-Pre: 73 %
FVC-PRE: 2.78 L
FVC-Post: 2.74 L
POST FEV6/FVC RATIO: 99 %
PRE FEV6/FVC RATIO: 96 %
Post FEV1/FVC ratio: 76 %
Pre FEV1/FVC ratio: 68 %
RV % pred: 90 %
RV: 2.12 L
TLC % PRED: 85 %
TLC: 4.95 L

## 2018-03-04 MED ORDER — ALBUTEROL SULFATE (2.5 MG/3ML) 0.083% IN NEBU
2.5000 mg | INHALATION_SOLUTION | Freq: Once | RESPIRATORY_TRACT | Status: AC
  Administered 2018-03-04: 2.5 mg via RESPIRATORY_TRACT

## 2018-03-05 ENCOUNTER — Encounter (HOSPITAL_COMMUNITY): Payer: Self-pay | Admitting: *Deleted

## 2018-03-05 NOTE — Progress Notes (Signed)
Per Dr Haroldine Laws pt referred to [facility] CTEPH clinic.  Records faxed to Sonia Baller at [phone], disc w/CT, VQ scan and R/L [facility] mailed to:  [place]: Kinnie Scales Room [address] [place], [place] [address]  Echo was sent electronically by the echo department

## 2018-03-15 DIAGNOSIS — J449 Chronic obstructive pulmonary disease, unspecified: Secondary | ICD-10-CM | POA: Diagnosis not present

## 2018-03-24 ENCOUNTER — Ambulatory Visit (HOSPITAL_COMMUNITY)
Admit: 2018-03-24 | Discharge: 2018-03-24 | Disposition: A | Discharge status: Home / Self Care | Payer: PPO | Attending: Cardiology | Admitting: Cardiology

## 2018-03-24 ENCOUNTER — Encounter (HOSPITAL_COMMUNITY): Payer: Self-pay

## 2018-03-24 VITALS — BP 128/84 | HR 90 | Wt 151.0 lb

## 2018-03-24 DIAGNOSIS — I2782 Chronic pulmonary embolism: Secondary | ICD-10-CM | POA: Diagnosis not present

## 2018-03-24 DIAGNOSIS — R55 Syncope and collapse: Secondary | ICD-10-CM | POA: Insufficient documentation

## 2018-03-24 DIAGNOSIS — Z9981 Dependence on supplemental oxygen: Secondary | ICD-10-CM | POA: Insufficient documentation

## 2018-03-24 DIAGNOSIS — E039 Hypothyroidism, unspecified: Secondary | ICD-10-CM | POA: Diagnosis not present

## 2018-03-24 DIAGNOSIS — I2721 Secondary pulmonary arterial hypertension: Secondary | ICD-10-CM

## 2018-03-24 DIAGNOSIS — J9611 Chronic respiratory failure with hypoxia: Secondary | ICD-10-CM | POA: Insufficient documentation

## 2018-03-24 DIAGNOSIS — I2609 Other pulmonary embolism with acute cor pulmonale: Secondary | ICD-10-CM | POA: Diagnosis not present

## 2018-03-24 DIAGNOSIS — Z8041 Family history of malignant neoplasm of ovary: Secondary | ICD-10-CM | POA: Insufficient documentation

## 2018-03-24 DIAGNOSIS — Z86718 Personal history of other venous thrombosis and embolism: Secondary | ICD-10-CM | POA: Insufficient documentation

## 2018-03-24 DIAGNOSIS — Z801 Family history of malignant neoplasm of trachea, bronchus and lung: Secondary | ICD-10-CM | POA: Diagnosis not present

## 2018-03-24 DIAGNOSIS — Z7901 Long term (current) use of anticoagulants: Secondary | ICD-10-CM | POA: Insufficient documentation

## 2018-03-24 DIAGNOSIS — Z8249 Family history of ischemic heart disease and other diseases of the circulatory system: Secondary | ICD-10-CM | POA: Insufficient documentation

## 2018-03-24 DIAGNOSIS — I442 Atrioventricular block, complete: Secondary | ICD-10-CM | POA: Diagnosis not present

## 2018-03-24 DIAGNOSIS — Z806 Family history of leukemia: Secondary | ICD-10-CM | POA: Diagnosis not present

## 2018-03-24 DIAGNOSIS — I447 Left bundle-branch block, unspecified: Secondary | ICD-10-CM | POA: Insufficient documentation

## 2018-03-24 DIAGNOSIS — Z85818 Personal history of malignant neoplasm of other sites of lip, oral cavity, and pharynx: Secondary | ICD-10-CM | POA: Insufficient documentation

## 2018-03-24 DIAGNOSIS — F419 Anxiety disorder, unspecified: Secondary | ICD-10-CM | POA: Insufficient documentation

## 2018-03-24 NOTE — Progress Notes (Signed)
Advanced Heart Failure Clinic Note   03/24/2018 Danielle Pratt   07-31-1951  [id number]  Primary Physician Danielle Rail, MD Primary Cardiologist: Danielle Harp MD Danielle Pratt, [place], [place]  HPI:  Danielle Pratt is a 67 y.o. thin and fit-appearing woman referred by Dr. Gwenlyn Pratt for further evaluation of CTEPH. She is here with her husband Danielle Pratt.   She has also been followed by Dr. Lebron Pratt , hematology oncology, for evaluation and treatment of chronic pulmonary thromboembolic diseases.   Denies any known h/o cardiac disease. 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 Smoked a little bit as a teenager but none since. Reports a superficial LLE clot in 1994. In March 2017 was diagnosed with a DVT and PE  that occurred in association with decreased mobility due to a brace applied for dislocation of an implanted hip. She was on oral anticoagulation for 6 months after that.   She had recurrent shortness of breath and fatigue in January 2019. Presented to ED and Pratt to have bilateral segmental and subsegmental PE. No evidence of significant RV strain so not given thrombolytics. LE dopplers were negative. Started on Eliquis. Hypercoag w/u by Dr. Lebron Pratt has been negative. She denies FHX of hypercoag disorders, PAH or CTEPH. No h/o CTD.   Observed 4/22 - 03/03/18 s/p cath. Cath showed cors and very mild PAH. Had iLBBB on baseline ECG but complete LBBB on ECG from am of procedure. During manipulation of the pigtail in her RV she developed CHB with no ventricular escape. TVP wire placed immediately but patient recovered spontaneously. Kept  for observation.  CTA chest 03/03/18 showed "Stable appearance of probable chronic bilateral pulmonary emboli are noted. No new or acute pulmonary emboli are noted. RV LV ratio is measured at 1.2 suggesting right heart strain. No other abnormality seen in the chest.  She presents today for post cath follow up. Doing great  overall. Remains on O2. Hasn't heard back from [facility] CTEPH clinic. Sats remain > 90%. Has many questions. Remains active. No SOB with ADLs or mild activity. SOB with stairs or if she gets in a hurry. Denies lightheadedness or dizziness. No syncope. Taking all medications as directed.   03/04/18  R/LHC 03/02/18 Ao =118/70 (91) LV = 127/12 RA = 2 RV = 47/3 PA = 46/17 (28) PCW = 16 Fick cardiac output/index = 4.5/2.4 PVR = 2.6 WU FA sat = 93% on 3L PA sat = 61%, 63%  Assessment: 1. Normal coronary arteries with left-dominant system 2. Normal LV function by echo 3. Very mild PAH due to CTEPH 4. Development of CHB with attempts to place pigtail catheter for pulmonary angiography - resolved spontaneously  VQ scan  02/13/18 1. High probability study for pulmonary thromboembolism. Multiple bilateral segmental perfusion defects, on the left partly matched with ventilatory defects, and with no corresponding chest radiographic abnormality.  Echo 2/19 LVEF 50-55% RV mildly dialted but systolic function I Feel is normal. Trivial TR. IVC ok   Review of systems complete and Pratt to be negative unless listed in HPI.    Past Medical History:  Diagnosis Date  . Anxiety   . Arthritis    "hands" (11/12/2017)  . Cataract    extraction surgery  . DVT (deep venous thrombosis) ([place]) 01/2016   LLE  . Fasting hyperglycemia    110 in 01/2011;131 in 05/2007  . Hypothyroidism   . Popliteal cyst   . Pulmonary embolism ([place]) 2017; 11/11/2017   "the  one in 2017 was due to LLE DVT; don't why I had the one 11/11/2017"  . Renal insufficiency   . Tonsillar cancer ([place]) 1998     No outpatient medications have been marked as taking for the 03/24/18 encounter [facility] Encounter) with [place].     No Known Allergies  Social History   Socioeconomic History  . Marital status: Married    Spouse name: Not on file  . Number of children: Not on file  . Years of education: Not on file  . Highest  education level: Not on file  Occupational History  . Not on file  Social Needs  . Financial resource strain: Not on file  . Food insecurity:    Worry: Not on file    Inability: Not on file  . Transportation needs:    Medical: Not on file    Non-medical: Not on file  Tobacco Use  . Smoking status: Never Smoker  . Smokeless tobacco: Never Used  Substance and Sexual Activity  . Alcohol use: Yes    Alcohol/week: 1.8 oz    Types: 3 Cans of beer per week  . Drug use: No  . Sexual activity: Yes  Lifestyle  . Physical activity:    Days per week: Not on file    Minutes per session: Not on file  . Stress: Not on file  Relationships  . Social connections:    Talks on phone: Not on file    Gets together: Not on file    Attends religious service: Not on file    Active member of club or organization: Not on file    Attends meetings of clubs or organizations: Not on file    Relationship status: Not on file  . Intimate partner violence:    Fear of current or ex partner: Not on file    Emotionally abused: Not on file    Physically abused: Not on file    Forced sexual activity: Not on file  Other Topics Concern  . Not on file  Social History Narrative  . Not on file    Family History  Problem Relation Age of Onset  . Hyperlipidemia Mother   . Hypertension Mother   . Ovarian cancer Mother   . Aneurysm Mother        ascending & descending aorta  . Heart attack Father 38  . Leukemia Paternal Grandmother   . Lung cancer Paternal Grandfather   . Diabetes Neg Hx   . Stroke Neg Hx   . Colon cancer Neg Hx   . Stomach cancer Neg Hx   . Rectal cancer Neg Hx    Vitals:   03/24/18 1137  BP: 128/84  Pulse: 90  SpO2: (!) 88%  Weight: 151 lb (68.5 kg)   Wt Readings from Last 3 Encounters:  03/24/18 151 lb (68.5 kg)  03/02/18 155 lb (70.3 kg)  02/25/18 152 lb (68.9 kg)    Physical exam  General: Well appearing. NAD.  HEENT: Normal On O2 via [place]. Neck: Supple. JVP 5-6.  Carotids 2+ bilat; no bruits. No thyromegaly or nodule noted. Cor: PMI nondisplaced. RRR, No M/G/R noted Lungs: CTAB, normal effort. Abdomen: Soft, non-tender, non-distended, no HSM. No bruits or masses. +BS  Extremities: No cyanosis, clubbing, or rash. R and LLE no edema.  Neuro: Alert & orientedx3, cranial nerves grossly intact. moves all 4 extremities w/o difficulty. Affect pleasant    EKG: No new tracings.    ASSESSMENT AND PLAN:  1.  Chronic pulmonary emboli with probable CTEPH - RHC 03/02/18 with normal coronaries, normal LV function, very mild PAH due to CTEPH.  - Dr. Haroldine Pratt previously reviewed Echo and VQ scan that showed multiple significant perfusion defects that confirm the diagnosis of chronic PE.  - Minimal RV strain on echo and lack of significant TR jet.  - Have previously discussed that several of the [place] agents have shown benefit for patients who are not candidates for, or opt against, surgery  - Referred to CTEPH clinic on 03/05/18.  2. Chronic hypoxic respiratory failure due to #1 - Continue chronic O2.  3. Mild PAH - PA pressure 46/17 (28) - Referred to [facility] CTEPH clinic for further evaluation for thombo-embolectomy.  4. LBBB with intra-procedural CHB with transient LOC - LOC resolved spontaneously.  - Stable.   Confirmed CTEPH referral sent. Will discuss with Dr. Webb Silversmith, PA-C  11:35 AM   Greater than 50% of the 25 minute visit was spent in counseling/coordination of care regarding disease state education, salt/fluid restriction, sliding scale diuretics, and medication compliance.

## 2018-03-24 NOTE — Patient Instructions (Signed)
Referral placed for [facility] CTEPH Clinic  Follow up as scheduled.

## 2018-03-25 NOTE — Progress Notes (Signed)
Danielle Pratt - please refer to [facility] program at [facility] in referral for CTEPH. Thanks -dan

## 2018-03-30 ENCOUNTER — Other Ambulatory Visit: Payer: Self-pay

## 2018-03-30 ENCOUNTER — Ambulatory Visit (HOSPITAL_COMMUNITY): Payer: PPO | Attending: Cardiology

## 2018-03-30 DIAGNOSIS — Z86718 Personal history of other venous thrombosis and embolism: Secondary | ICD-10-CM | POA: Diagnosis not present

## 2018-03-30 DIAGNOSIS — I2609 Other pulmonary embolism with acute cor pulmonale: Secondary | ICD-10-CM | POA: Insufficient documentation

## 2018-03-30 DIAGNOSIS — N289 Disorder of kidney and ureter, unspecified: Secondary | ICD-10-CM | POA: Insufficient documentation

## 2018-03-30 DIAGNOSIS — F419 Anxiety disorder, unspecified: Secondary | ICD-10-CM | POA: Diagnosis not present

## 2018-03-30 DIAGNOSIS — E039 Hypothyroidism, unspecified: Secondary | ICD-10-CM | POA: Diagnosis not present

## 2018-03-31 ENCOUNTER — Telehealth: Payer: Self-pay | Admitting: Internal Medicine

## 2018-03-31 NOTE — Progress Notes (Signed)
LMTCB

## 2018-03-31 NOTE — Telephone Encounter (Signed)
Notes recorded by Tanda Rockers, MD on 03/31/2018 at 5:36 AM EDT Call patient : Study is much improved no change rx   Advised pt of results. Pt understood and nothing further is needed.

## 2018-04-07 ENCOUNTER — Telehealth (HOSPITAL_COMMUNITY): Payer: Self-pay | Admitting: *Deleted

## 2018-04-07 DIAGNOSIS — M79605 Pain in left leg: Secondary | ICD-10-CM

## 2018-04-07 NOTE — Telephone Encounter (Signed)
U/S sch for tomorrow at 9am, pt is aware and agreeable.

## 2018-04-07 NOTE — Telephone Encounter (Signed)
Ok to order u/s. Based on half-life of Eliquis doubt missing 2 doses will be an issue. Has she heard from [place] yet?

## 2018-04-07 NOTE — Telephone Encounter (Signed)
Pt called concerned she could have a DVT in her Left leg.  She states there is spot on her left calf that it is very tender and sore, she states this how her DVT presented in the past.  She denies any redness and it is not hot to touch.  She is on Eliquis 5 mg BID and states she has missed 2 doses the past month.  Will send to Dr Haroldine Laws for further recommendations.

## 2018-04-08 ENCOUNTER — Ambulatory Visit (HOSPITAL_COMMUNITY)
Admission: RE | Admit: 2018-04-08 | Discharge: 2018-04-08 | Disposition: A | Discharge status: Home / Self Care | Payer: PPO | Source: Ambulatory Visit | Attending: Internal Medicine | Admitting: Internal Medicine

## 2018-04-08 DIAGNOSIS — M79605 Pain in left leg: Secondary | ICD-10-CM | POA: Diagnosis not present

## 2018-04-08 DIAGNOSIS — M7122 Synovial cyst of popliteal space [Baker], left knee: Secondary | ICD-10-CM | POA: Diagnosis not present

## 2018-04-08 NOTE — Progress Notes (Signed)
Preliminary results by tech - Left Lower Ext. Venous Duplex Completed. Negative for deep and superficial vein thrombosis.A large Baker's Cyst was noted, which is known.  Oda Cogan, BS, RDMS, RVT

## 2018-04-13 ENCOUNTER — Telehealth: Payer: Self-pay | Admitting: Internal Medicine

## 2018-04-13 NOTE — Telephone Encounter (Signed)
She can take tylenol and drink plenty of fluids but if not doing better then  Needs to be seen by PCP or UC

## 2018-04-13 NOTE — Telephone Encounter (Signed)
Called and spoke with patient. Advised her of MW's recommendations. Patient thanked staff for letting her know. Nothing further needed at this time.

## 2018-04-13 NOTE — Telephone Encounter (Signed)
Called and spoke to patient. Patient stated she is having aches all over, a headache, and fever of 101. Patient is on Eliquis and is not sure what she can take with it. Patient stated she wasn't sure what OTC pain reliever/fever reducer she should take.  MW please advise.

## 2018-04-15 ENCOUNTER — Encounter (HOSPITAL_COMMUNITY): Payer: PPO | Admitting: Internal Medicine

## 2018-04-15 DIAGNOSIS — J449 Chronic obstructive pulmonary disease, unspecified: Secondary | ICD-10-CM | POA: Diagnosis not present

## 2018-04-16 NOTE — Progress Notes (Signed)
Subjective:    Patient ID: Danielle Pratt, female    DOB: [birth year], 67 y.o.   MRN: [id number]  HPI The patient is here for an acute visit.  Ear pain:  Last week she reached behind her right ear and felt something that felt like dirt  - she had been working in the yard earlier.  She scratched the area.  It has been tender since then.  It is warmer on the right side than the left.  She is not able to see it and was concerned about cellulitis.     Medications and allergies reviewed with patient and updated if appropriate.  Patient Active Problem List   Diagnosis Date Noted  . PAH (pulmonary artery hypertension) ([place]) 03/02/2018  . Left leg pain 02/18/2018  . Oropharyngeal dysphagia 01/01/2018  . Hyperphosphatemia 11/14/2017  . Closed fracture of sesamoid bone of foot 06/18/2017  . Postnasal drip 12/02/2016  . Hip dislocation, right ([place]) 02/20/2016  . DVT, RLE 01/31/16 02/20/2016  . Anxiety 02/20/2016  . Pulmonary emboli ([place]) 01/31/2016  . Multiple pigmented nevi 07/28/2015  . Unruptured popliteal cyst 09/07/2013  . HYPERLIPIDEMIA 01/01/2010  . LEFT BUNDLE BRANCH BLOCK 01/01/2010  . OSTEOARTHRITIS 01/01/2010  . Malignant neoplasm of tonsillar fossa ([place]) 12/27/2008  . Hypothyroidism 07/17/2007    Current Outpatient Medications on File Prior to Visit  Medication Sig Dispense Refill  . apixaban (ELIQUIS) 5 MG TABS tablet Take 1 tablet (5 mg total) by mouth 2 (two) times daily. 60 tablet 5  . Cholecalciferol 2000 units TABS Take 4,000 Units by mouth daily.    . clobetasol cream (TEMOVATE) 4.03 % Apply 1 application topically at bedtime as needed (for skin irritation/finger).     [name] levothyroxine (SYNTHROID, LEVOTHROID) 50 MCG tablet Take 1.5 tablets (75 mcg total) by mouth daily before breakfast. 126 tablet 0  . OXYGEN Inhale 2 L into the lungs continuous.    . pilocarpine (SALAGEN) 5 MG tablet Take 5 mg by mouth See admin instructions. Take 1 tablet (5 mg) by mouth 3 times  daily - early morning, midday and bedtime, may take an additional tablet during the day as needed for dry mouth.    . venlafaxine XR (EFFEXOR-XR) 75 MG 24 hr capsule TAKE ONE CAPSULE BY MOUTH AT BEDTIME (Patient taking differently: TAKE 75 MG BY MOUTH AT BEDTIME) 90 capsule 1   No current facility-administered medications on file prior to visit.     Past Medical History:  Diagnosis Date  . Anxiety   . Arthritis    "hands" (11/12/2017)  . Cataract    extraction surgery  . DVT (deep venous thrombosis) ([place]) 01/2016   LLE  . Fasting hyperglycemia    110 in 01/2011;131 in 05/2007  . Hypothyroidism   . Popliteal cyst   . Pulmonary embolism ([place]) 2017; 11/11/2017   "the one in 2017 was due to LLE DVT; don't why I had the one 11/11/2017"  . Renal insufficiency   . Tonsillar cancer ([place]) 1998    Past Surgical History:  Procedure Laterality Date  . CESAREAN SECTION  1984   G 1 P 2  . COLONOSCOPY  2003   negative; Dr Henrene Pastor  . JOINT REPLACEMENT    . LYMPHADENECTOMY Right 1998   neck  . RIGHT/LEFT HEART CATH AND CORONARY ANGIOGRAPHY N/A 03/02/2018   Procedure: RIGHT/LEFT HEART CATH AND CORONARY ANGIOGRAPHY;  Surgeon: Jolaine Artist, MD;  Location: [facility];  Service: Cardiovascular;  Laterality: N/A;  .  TEMPORARY PACEMAKER N/A 03/02/2018   Procedure: TEMPORARY PACEMAKER;  Surgeon: Jolaine Artist, MD;  Location: [facility];  Service: Cardiovascular;  Laterality: N/A;  . TONSILLECTOMY AND ADENOIDECTOMY Right 1998   R tonsilar malignancy; subsequent LN dissection negative  . TOTAL ABDOMINAL HYSTERECTOMY  1980s   TOTAL ABDOMINAL HYSTERECTOMY W/ BILATERAL SALPINGOOPHORECTOMY [SHX83]; fibroids; Dr. Nori Riis  . TOTAL HIP ARTHROPLASTY Bilateral 2007   Dr Maureen Ralphs, both hips 3 months apart    Social History   Socioeconomic History  . Marital status: Married    Spouse name: Not on file  . Number of children: Not on file  . Years of education: Not on file  . Highest  education level: Not on file  Occupational History  . Not on file  Social Needs  . Financial resource strain: Not on file  . Food insecurity:    Worry: Not on file    Inability: Not on file  . Transportation needs:    Medical: Not on file    Non-medical: Not on file  Tobacco Use  . Smoking status: Never Smoker  . Smokeless tobacco: Never Used  Substance and Sexual Activity  . Alcohol use: Yes    Alcohol/week: 1.8 oz    Types: 3 Cans of beer per week  . Drug use: No  . Sexual activity: Yes  Lifestyle  . Physical activity:    Days per week: Not on file    Minutes per session: Not on file  . Stress: Not on file  Relationships  . Social connections:    Talks on phone: Not on file    Gets together: Not on file    Attends religious service: Not on file    Active member of club or organization: Not on file    Attends meetings of clubs or organizations: Not on file    Relationship status: Not on file  Other Topics Concern  . Not on file  Social History Narrative  . Not on file    Family History  Problem Relation Age of Onset  . Hyperlipidemia Mother   . Hypertension Mother   . Ovarian cancer Mother   . Aneurysm Mother        ascending & descending aorta  . Heart attack Father 44  . Leukemia Paternal Grandmother   . Lung cancer Paternal Grandfather   . Diabetes Neg Hx   . Stroke Neg Hx   . Colon cancer Neg Hx   . Stomach cancer Neg Hx   . Rectal cancer Neg Hx     Review of Systems  Constitutional: Negative for chills and fever.  Skin: Positive for color change (told it was different - ? red). Negative for rash and wound.       Objective:   Vitals:   04/17/18 1451  BP: 114/86  Pulse: 86  Resp: 16  Temp: 97.8 F (36.6 C)  SpO2: 97%   BP Readings from Last 3 Encounters:  04/17/18 114/86  03/24/18 128/84  03/03/18 111/71   Wt Readings from Last 3 Encounters:  04/17/18 152 lb (68.9 kg)  03/24/18 151 lb (68.5 kg)  03/02/18 155 lb (70.3 kg)   Body  mass index is 22.45 kg/m.   Physical Exam  Constitutional: She appears well-developed and well-nourished. No distress.  HENT:  Head: Normocephalic and atraumatic.  Right Ear: External ear normal.  Left Ear: External ear normal.  B/l ear canals and TM normal  Skin: Skin is warm and dry. She is  not diaphoretic. There is erythema (minimal erythema posterior right ear with dryness/fine papular rash).            Assessment & Plan:    See Problem List for Assessment and Plan of chronic medical problems.

## 2018-04-17 ENCOUNTER — Telehealth: Payer: Self-pay | Admitting: Hematology and Oncology

## 2018-04-17 ENCOUNTER — Encounter: Payer: Self-pay | Admitting: Internal Medicine

## 2018-04-17 ENCOUNTER — Ambulatory Visit (INDEPENDENT_AMBULATORY_CARE_PROVIDER_SITE_OTHER): Payer: PPO | Admitting: Internal Medicine

## 2018-04-17 VITALS — BP 114/86 | HR 86 | Temp 97.8°F | Resp 16 | Wt 152.0 lb

## 2018-04-17 DIAGNOSIS — L989 Disorder of the skin and subcutaneous tissue, unspecified: Secondary | ICD-10-CM | POA: Insufficient documentation

## 2018-04-17 NOTE — Patient Instructions (Signed)
Try applying symptomatic ointment /cream for the back of your ear.     Call if no improvement

## 2018-04-17 NOTE — Assessment & Plan Note (Signed)
Mild skin abnormality right posterior ear - looks like dryness or mild irritation, does not look like an infection, especially since it has been there for several days Discussed symptomatic treatment and close monitoring Call if no improvement

## 2018-04-17 NOTE — Telephone Encounter (Signed)
Called pt re appts being moved to MM - left vm for pt re appts band snet out confirmation letter.

## 2018-04-24 ENCOUNTER — Encounter: Payer: Self-pay | Admitting: Internal Medicine

## 2018-04-24 ENCOUNTER — Ambulatory Visit (INDEPENDENT_AMBULATORY_CARE_PROVIDER_SITE_OTHER): Payer: PPO | Admitting: Internal Medicine

## 2018-04-24 ENCOUNTER — Ambulatory Visit: Payer: PPO | Admitting: Family Medicine

## 2018-04-24 VITALS — BP 116/70 | HR 103 | Temp 98.2°F | Ht 69.0 in | Wt 154.0 lb

## 2018-04-24 DIAGNOSIS — R21 Rash and other nonspecific skin eruption: Secondary | ICD-10-CM | POA: Diagnosis not present

## 2018-04-24 DIAGNOSIS — I2721 Secondary pulmonary arterial hypertension: Secondary | ICD-10-CM

## 2018-04-24 DIAGNOSIS — L989 Disorder of the skin and subcutaneous tissue, unspecified: Secondary | ICD-10-CM

## 2018-04-24 DIAGNOSIS — F419 Anxiety disorder, unspecified: Secondary | ICD-10-CM

## 2018-04-24 MED ORDER — PREDNISONE 10 MG PO TABS: ORAL_TABLET | ORAL | 0 refills | Status: DC

## 2018-04-24 MED ORDER — METHYLPREDNISOLONE ACETATE 80 MG/ML IJ SUSP
80.0000 mg | Freq: Once | INTRAMUSCULAR | Status: AC
  Administered 2018-04-24: 80 mg via INTRAMUSCULAR

## 2018-04-24 NOTE — Assessment & Plan Note (Signed)
Encouraged pt f/u with [facility] as planned

## 2018-04-24 NOTE — Progress Notes (Signed)
Subjective:    Patient ID: Danielle Pratt, female    DOB: Feb 15, 1951, 67 y.o.   MRN: [id number]  HPI  Here to f/u with 5 day onset non pruritic but numerous patchy plaquelike erythema areas to the torso and extremities in random fashion, nontender, just not getting better.  Has no obvious contact exposures and her meds are not common to cause rash.   Did have a documented at home temp 103 at 1 wk ago, but seemed better the next day and no other specific symptoms. Denies urinary symptoms such as dysuria, frequency, urgency, flank pain, hematuria or n/v, fever, chills.  Pt denies chest pain, increased sob or doe, wheezing, orthopnea, PND, increased LE swelling, palpitations, dizziness or syncope.  Right post ear irritation has improved.  Plans to soon see [facility] with respect to [facility] and hx of PE/DVT.  Denies worsening depressive symptoms, suicidal ideation, or panic Past Medical History:  Diagnosis Date  . Anxiety   . Arthritis    "hands" (11/12/2017)  . Cataract    extraction surgery  . DVT (deep venous thrombosis) ([place]) 01/2016   LLE  . Fasting hyperglycemia    110 in 01/2011;131 in 05/2007  . Hypothyroidism   . Popliteal cyst   . Pulmonary embolism ([place]) 2017; 11/11/2017   "the one in 2017 was due to LLE DVT; don't why I had the one 11/11/2017"  . Renal insufficiency   . Tonsillar cancer ([place]) 1998   Past Surgical History:  Procedure Laterality Date  . CESAREAN SECTION  1984   G 1 P 2  . COLONOSCOPY  2003   negative; Dr Henrene Pastor  . JOINT REPLACEMENT    . LYMPHADENECTOMY Right 1998   neck  . RIGHT/LEFT HEART CATH AND CORONARY ANGIOGRAPHY N/A 03/02/2018   Procedure: RIGHT/LEFT HEART CATH AND CORONARY ANGIOGRAPHY;  Surgeon: Jolaine Artist, MD;  Location: [facility];  Service: Cardiovascular;  Laterality: N/A;  . TEMPORARY PACEMAKER N/A 03/02/2018   Procedure: TEMPORARY PACEMAKER;  Surgeon: Jolaine Artist, MD;  Location: [facility];  Service: Cardiovascular;   Laterality: N/A;  . TONSILLECTOMY AND ADENOIDECTOMY Right 1998   R tonsilar malignancy; subsequent LN dissection negative  . TOTAL ABDOMINAL HYSTERECTOMY  1980s   TOTAL ABDOMINAL HYSTERECTOMY W/ BILATERAL SALPINGOOPHORECTOMY [SHX83]; fibroids; Dr. Nori Riis  . TOTAL HIP ARTHROPLASTY Bilateral 2007   Dr Maureen Ralphs, both hips 3 months apart    reports that she has never smoked. She has never used smokeless tobacco. She reports that she drinks about 1.8 oz of alcohol per week. She reports that she does not use drugs. family history includes Aneurysm in her mother; Heart attack (age of onset: 66) in her father; Hyperlipidemia in her mother; Hypertension in her mother; Leukemia in her paternal grandmother; Lung cancer in her paternal grandfather; Ovarian cancer in her mother. No Known Allergies Current Outpatient Medications on File Prior to Visit  Medication Sig Dispense Refill  . apixaban (ELIQUIS) 5 MG TABS tablet Take 1 tablet (5 mg total) by mouth 2 (two) times daily. 60 tablet 5  . Cholecalciferol 2000 units TABS Take 4,000 Units by mouth daily.    . clobetasol cream (TEMOVATE) 1.44 % Apply 1 application topically at bedtime as needed (for skin irritation/finger).     [name] levothyroxine (SYNTHROID, LEVOTHROID) 50 MCG tablet Take 1.5 tablets (75 mcg total) by mouth daily before breakfast. 126 tablet 0  . OXYGEN Inhale 2 L into the lungs continuous.    . pilocarpine (  SALAGEN) 5 MG tablet Take 5 mg by mouth See admin instructions. Take 1 tablet (5 mg) by mouth 3 times daily - early morning, midday and bedtime, may take an additional tablet during the day as needed for dry mouth.    . venlafaxine XR (EFFEXOR-XR) 75 MG 24 hr capsule TAKE ONE CAPSULE BY MOUTH AT BEDTIME (Patient taking differently: TAKE 75 MG BY MOUTH AT BEDTIME) 90 capsule 1   No current facility-administered medications on file prior to visit.    Review of Systems  Constitutional: Negative for other unusual diaphoresis or sweats HENT:  Negative for ear discharge or swelling Eyes: Negative for other worsening visual disturbances Respiratory: Negative for stridor or other swelling  Gastrointestinal: Negative for worsening distension or other blood Genitourinary: Negative for retention or other urinary change Musculoskeletal: Negative for other MSK pain or swelling Skin: Negative for color change or other new lesions Neurological: Negative for worsening tremors and other numbness  Psychiatric/Behavioral: Negative for worsening agitation or other fatigue All other system neg per pt    Objective:   Physical Exam BP 116/70   Pulse (!) 103   Temp 98.2 F (36.8 C) (Oral)   Ht 5\' 9"  (1.753 m)   Wt 154 lb (69.9 kg)   SpO2 91%   BMI 22.74 kg/m  VS noted, on home o2 Constitutional: Pt appears in NAD HENT: Head: NCAT.  Right Ear: External ear normal.  Left Ear: External ear normal.  Eyes: . Pupils are equal, round, and reactive to light. Conjunctivae and EOM are normal Nose: without d/c or deformity Neck: Neck supple. Gross normal ROM Cardiovascular: Normal rate and regular rhythm.   Pulmonary/Chest: Effort normal and breath sounds decresaed without rales or wheezing.  Abd:  Soft, NT, ND, + BS, no organomegaly Neurological: Pt is alert. At baseline orientation, motor grossly intact Skin: Skin is warm. + erythem nontender plquelike rashes varying sizes to the torso and extremities from oval 2 cm, to 6 x 8 cm to the right lower back, no other new lesions, no LE edema Psychiatric: Pt behavior is normal without agitation , mild nervous No other exam findings    Assessment & Plan:

## 2018-04-24 NOTE — Assessment & Plan Note (Signed)
New issue of plaquelike erythem lesions nontender nonpruritis, variable size, some slight raised;  Cant r/o allergic vs vasculitis vs other;  OK for depomedrol IM 80, predpac asd which is likely to help but if recurs pt should see her dermatologist (she will call for sooner appt than aug already scheduled); will hold on lab eval for now, pt to f/u with PCP

## 2018-04-24 NOTE — Assessment & Plan Note (Signed)
stable overall by history and exam, and pt to continue medical treatment as before,  to f/u any worsening symptoms or concerns 

## 2018-04-24 NOTE — Patient Instructions (Signed)
You had the steroid shot today  Please take all new medication as prescribed - the prednisone  Please continue all other medications as before, and refills have been done if requested.  Please have the pharmacy call with any other refills you may need  Please keep your appointments with your specialists as you may have planned     

## 2018-04-28 DIAGNOSIS — R21 Rash and other nonspecific skin eruption: Secondary | ICD-10-CM | POA: Diagnosis not present

## 2018-04-29 ENCOUNTER — Encounter (HOSPITAL_COMMUNITY): Payer: PPO | Admitting: Internal Medicine

## 2018-04-30 ENCOUNTER — Ambulatory Visit (HOSPITAL_COMMUNITY)
Admission: RE | Admit: 2018-04-30 | Discharge: 2018-04-30 | Disposition: A | Discharge status: Home / Self Care | Payer: PPO | Source: Ambulatory Visit | Attending: Internal Medicine | Admitting: Internal Medicine

## 2018-04-30 VITALS — BP 122/68 | HR 82 | Wt 154.0 lb

## 2018-04-30 DIAGNOSIS — I447 Left bundle-branch block, unspecified: Secondary | ICD-10-CM | POA: Insufficient documentation

## 2018-04-30 DIAGNOSIS — Z79899 Other long term (current) drug therapy: Secondary | ICD-10-CM | POA: Insufficient documentation

## 2018-04-30 DIAGNOSIS — E039 Hypothyroidism, unspecified: Secondary | ICD-10-CM | POA: Diagnosis not present

## 2018-04-30 DIAGNOSIS — Z86711 Personal history of pulmonary embolism: Secondary | ICD-10-CM | POA: Diagnosis not present

## 2018-04-30 DIAGNOSIS — Z7901 Long term (current) use of anticoagulants: Secondary | ICD-10-CM | POA: Diagnosis not present

## 2018-04-30 DIAGNOSIS — Z86718 Personal history of other venous thrombosis and embolism: Secondary | ICD-10-CM | POA: Insufficient documentation

## 2018-04-30 DIAGNOSIS — I2724 Chronic thromboembolic pulmonary hypertension: Secondary | ICD-10-CM

## 2018-04-30 DIAGNOSIS — Z9981 Dependence on supplemental oxygen: Secondary | ICD-10-CM | POA: Insufficient documentation

## 2018-04-30 DIAGNOSIS — J9611 Chronic respiratory failure with hypoxia: Secondary | ICD-10-CM | POA: Insufficient documentation

## 2018-04-30 DIAGNOSIS — Z85818 Personal history of malignant neoplasm of other sites of lip, oral cavity, and pharynx: Secondary | ICD-10-CM | POA: Diagnosis not present

## 2018-04-30 DIAGNOSIS — Z8249 Family history of ischemic heart disease and other diseases of the circulatory system: Secondary | ICD-10-CM | POA: Insufficient documentation

## 2018-04-30 DIAGNOSIS — F419 Anxiety disorder, unspecified: Secondary | ICD-10-CM | POA: Diagnosis not present

## 2018-04-30 NOTE — Patient Instructions (Signed)
We will contact you in 4 months to schedule your next appointment.  

## 2018-04-30 NOTE — Progress Notes (Signed)
Advanced Heart Failure Clinic Note   04/30/2018 Danielle Pratt   12-16-1950  [id number]  Primary Physician Binnie Rail, MD Primary Cardiologist: Lorretta Harp MD [name], [place], [place]  HPI:  Danielle Pratt is a 67 y.o. thin and fit-appearing woman referred by Dr. 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 She is here with her husband Rett Saslow.   She has also been followed by Dr. Lebron Conners , hematology oncology, for evaluation and treatment of chronic pulmonary thromboembolic diseases.   Denies any known h/o cardiac disease. 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 Smoked a little bit as a teenager but none since. Reports a superficial LLE clot in 1994. In March 2017 was diagnosed with a DVT and PE  that occurred in association with decreased mobility due to a brace applied for dislocation of an implanted hip. She was on oral anticoagulation for 6 months after that.   She had recurrent shortness of breath and fatigue in January 2019. Presented to ED and found to have bilateral segmental and subsegmental PE. No evidence of significant RV strain so not given thrombolytics. LE dopplers were negative. Started on Eliquis. Hypercoag w/u by Dr. Lebron Conners has been negative. She denies FHX of hypercoag disorders, PAH or CTEPH. No h/o CTD.   Observed 4/22 - 03/03/18 s/p cath. Cath showed cors and very mild PAH. Had iLBBB on baseline ECG but complete LBBB on ECG from am of procedure. During manipulation of the pigtail in her RV she developed CHB with no ventricular escape. TVP wire placed immediately but patient recovered spontaneously. Kept  for observation.  CTA chest 03/03/18 showed "Stable appearance of probable chronic bilateral pulmonary emboli are noted. No new or acute pulmonary emboli are noted. RV LV ratio is measured at 1.2 suggesting right heart strain. No other abnormality seen in the chest.  Continue to do well. Able to do all ADLs but still SOB with  going up steps or carrying groceries. Sats stable on O2. No CP or palpitations. No edema. No syncope or presyncope. Has appt in [facility] on Tuesday. .  03/04/18  R/LHC 03/02/18 Ao =118/70 (91) LV = 127/12 RA = 2 RV = 47/3 PA = 46/17 (28) PCW = 16 Fick cardiac output/index = 4.5/2.4 PVR = 2.6 WU FA sat = 93% on 3L PA sat = 61%, 63%  Assessment: 1. Normal coronary arteries with left-dominant system 2. Normal LV function by echo 3. Very mild PAH due to CTEPH 4. Development of CHB with attempts to place pigtail catheter for pulmonary angiography - resolved spontaneously  VQ scan  02/13/18 1. High probability study for pulmonary thromboembolism. Multiple bilateral segmental perfusion defects, on the left partly matched with ventilatory defects, and with no corresponding chest radiographic abnormality.  Echo 2/19 LVEF 50-55% RV mildly dialted but systolic function I Feel is normal. Trivial TR. IVC ok   Review of systems complete and found to be negative unless listed in HPI.    Past Medical History:  Diagnosis Date  . Anxiety   . Arthritis    "hands" (11/12/2017)  . Cataract    extraction surgery  . DVT (deep venous thrombosis) ([place]) 01/2016   LLE  . Fasting hyperglycemia    110 in 01/2011;131 in 05/2007  . Hypothyroidism   . Popliteal cyst   . Pulmonary embolism ([place]) 2017; 11/11/2017   "the one in 2017 was due to LLE DVT; don't why I had the one 11/11/2017"  .  Renal insufficiency   . Tonsillar cancer ([place]) 1998     Current Meds  Medication Sig  . apixaban (ELIQUIS) 5 MG TABS tablet Take 1 tablet (5 mg total) by mouth 2 (two) times daily.  . Cholecalciferol 2000 units TABS Take 4,000 Units by mouth daily.  . clobetasol cream (TEMOVATE) 7.61 % Apply 1 application topically at bedtime as needed (for skin irritation/finger).   [name] levothyroxine (SYNTHROID, LEVOTHROID) 50 MCG tablet Take 1.5 tablets (75 mcg total) by mouth daily before breakfast.  . OXYGEN Inhale 2 L  into the lungs continuous.  . pilocarpine (SALAGEN) 5 MG tablet Take 5 mg by mouth See admin instructions. Take 1 tablet (5 mg) by mouth 3 times daily - early morning, midday and bedtime, may take an additional tablet during the day as needed for dry mouth.  . predniSONE (DELTASONE) 10 MG tablet 3 tabs by mouth per day for 3 days,2tabs per day for 3 days,1tab per day for 3 days  . venlafaxine XR (EFFEXOR-XR) 75 MG 24 hr capsule TAKE ONE CAPSULE BY MOUTH AT BEDTIME (Patient taking differently: TAKE 75 MG BY MOUTH AT BEDTIME)     No Known Allergies  Social History   Socioeconomic History  . Marital status: Married    Spouse name: Not on file  . Number of children: Not on file  . Years of education: Not on file  . Highest education level: Not on file  Occupational History  . Not on file  Social Needs  . Financial resource strain: Not on file  . Food insecurity:    Worry: Not on file    Inability: Not on file  . Transportation needs:    Medical: Not on file    Non-medical: Not on file  Tobacco Use  . Smoking status: Never Smoker  . Smokeless tobacco: Never Used  Substance and Sexual Activity  . Alcohol use: Yes    Alcohol/week: 1.8 oz    Types: 3 Cans of beer per week  . Drug use: No  . Sexual activity: Yes  Lifestyle  . Physical activity:    Days per week: Not on file    Minutes per session: Not on file  . Stress: Not on file  Relationships  . Social connections:    Talks on phone: Not on file    Gets together: Not on file    Attends religious service: Not on file    Active member of club or organization: Not on file    Attends meetings of clubs or organizations: Not on file    Relationship status: Not on file  . Intimate partner violence:    Fear of current or ex partner: Not on file    Emotionally abused: Not on file    Physically abused: Not on file    Forced sexual activity: Not on file  Other Topics Concern  . Not on file  Social History Narrative  . Not on  file    Family History  Problem Relation Age of Onset  . Hyperlipidemia Mother   . Hypertension Mother   . Ovarian cancer Mother   . Aneurysm Mother        ascending & descending aorta  . Heart attack Father 56  . Leukemia Paternal Grandmother   . Lung cancer Paternal Grandfather   . Diabetes Neg Hx   . Stroke Neg Hx   . Colon cancer Neg Hx   . Stomach cancer Neg Hx   . Rectal cancer Neg  Hx    Vitals:   04/30/18 1416  BP: 122/68  Pulse: 82  SpO2: 91%  Weight: 154 lb (69.9 kg)   Wt Readings from Last 3 Encounters:  04/30/18 154 lb (69.9 kg)  04/24/18 154 lb (69.9 kg)  04/17/18 152 lb (68.9 kg)    Physical exam  General:  Well appearing. No resp difficulty wearing O2 HEENT: normal Neck: supple. no JVD. Carotids 2+ bilat; no bruits. No lymphadenopathy or thryomegaly appreciated. Cor: PMI nondisplaced. Regular rate & rhythm. 2/6 TR slightly increased P2 Lungs: clear Abdomen: soft, nontender, nondistended. No hepatosplenomegaly. No bruits or masses. Good bowel sounds. Extremities: no cyanosis, clubbing, rash, edema Neuro: alert & orientedx3, cranial nerves grossly intact. moves all 4 extremities w/o difficulty. Affect pleasant  EKG: No new tracings.    ASSESSMENT AND PLAN:  1. Chronic pulmonary emboli with probable CTEPH - RHC 03/02/18 with normal coronaries, normal LV function, very mild PAH due to CTEPH.  - Minimal RV strain on echo and lack of significant TR jet.  - Overall stable. Has appt with [facility] next week for evaluation for pulmonary thromboembolectomy - I reviewed the process with her and her husband 2. Chronic hypoxic respiratory failure due to #1 - Continue chronic O2. Sats stable 3. Mild PAH - PA pressure 46/17 (28) - Referred to [facility] CTEPH clinic for further evaluation for thombo-embolectomy.  4. LBBB with intra-procedural CHB during attempted pulmonary angiography with pigtail catheter   Glori Bickers, MD  6:46 PM

## 2018-05-02 ENCOUNTER — Other Ambulatory Visit: Payer: Self-pay | Admitting: Internal Medicine

## 2018-05-05 DIAGNOSIS — I2782 Chronic pulmonary embolism: Secondary | ICD-10-CM | POA: Diagnosis not present

## 2018-05-05 DIAGNOSIS — I2721 Secondary pulmonary arterial hypertension: Secondary | ICD-10-CM | POA: Diagnosis not present

## 2018-05-05 DIAGNOSIS — Z79899 Other long term (current) drug therapy: Secondary | ICD-10-CM | POA: Diagnosis not present

## 2018-05-05 DIAGNOSIS — R0602 Shortness of breath: Secondary | ICD-10-CM | POA: Diagnosis not present

## 2018-05-05 DIAGNOSIS — I4581 Long QT syndrome: Secondary | ICD-10-CM | POA: Diagnosis not present

## 2018-05-05 DIAGNOSIS — I2724 Chronic thromboembolic pulmonary hypertension: Secondary | ICD-10-CM | POA: Diagnosis not present

## 2018-05-05 DIAGNOSIS — I517 Cardiomegaly: Secondary | ICD-10-CM | POA: Diagnosis not present

## 2018-05-05 DIAGNOSIS — I444 Left anterior fascicular block: Secondary | ICD-10-CM | POA: Diagnosis not present

## 2018-05-07 DIAGNOSIS — D2239 Melanocytic nevi of other parts of face: Secondary | ICD-10-CM | POA: Diagnosis not present

## 2018-05-07 DIAGNOSIS — D225 Melanocytic nevi of trunk: Secondary | ICD-10-CM | POA: Diagnosis not present

## 2018-05-07 DIAGNOSIS — L720 Epidermal cyst: Secondary | ICD-10-CM | POA: Diagnosis not present

## 2018-05-07 DIAGNOSIS — L821 Other seborrheic keratosis: Secondary | ICD-10-CM | POA: Diagnosis not present

## 2018-05-07 DIAGNOSIS — L814 Other melanin hyperpigmentation: Secondary | ICD-10-CM | POA: Diagnosis not present

## 2018-05-13 ENCOUNTER — Other Ambulatory Visit: Payer: Self-pay | Admitting: Obstetrics & Gynecology

## 2018-05-13 DIAGNOSIS — Z1231 Encounter for screening mammogram for malignant neoplasm of breast: Secondary | ICD-10-CM

## 2018-05-15 ENCOUNTER — Ambulatory Visit (INDEPENDENT_AMBULATORY_CARE_PROVIDER_SITE_OTHER): Payer: PPO | Admitting: Family Medicine

## 2018-05-15 ENCOUNTER — Encounter: Payer: Self-pay | Admitting: Family Medicine

## 2018-05-15 ENCOUNTER — Other Ambulatory Visit: Payer: Self-pay | Admitting: Internal Medicine

## 2018-05-15 VITALS — BP 102/58 | HR 86 | Ht 69.0 in | Wt 147.0 lb

## 2018-05-15 DIAGNOSIS — M25521 Pain in right elbow: Secondary | ICD-10-CM

## 2018-05-15 DIAGNOSIS — J449 Chronic obstructive pulmonary disease, unspecified: Secondary | ICD-10-CM | POA: Diagnosis not present

## 2018-05-15 DIAGNOSIS — M25522 Pain in left elbow: Secondary | ICD-10-CM

## 2018-05-15 NOTE — Patient Instructions (Signed)
Good to see you  Please try the exercises if you feel that they help  Please try the medication if its hurting  Please see me back in 3 weeks if the pain hasn't improved.

## 2018-05-15 NOTE — Progress Notes (Signed)
Danielle Pratt - 67 y.o. female MRN [id number]  Date of birth: [birth year]  SUBJECTIVE:  Including CC & ROS.  Chief Complaint  Patient presents with  . Bilateral elbow pain    Danielle Pratt is a 67 y.o. female that is presenting with bilateral elbow pain. Ongoing for one week. Pain located on the posterior aspect of each elbow. Flexion and extension trigger the pain. Denies swelling. Admits to tenderness to the touch. Denies injury. Does not recall hitting her elbow. She has been taking tylenol for the pain. Pain has improved today.     Review of Systems  Constitutional: Negative for fever.  HENT: Negative for congestion.   Respiratory: Positive for shortness of breath.   Cardiovascular: Negative for chest pain.  Gastrointestinal: Negative for abdominal distention.  Musculoskeletal: Negative for back pain.    HISTORY: Past Medical, Surgical, Social, and Family History Reviewed & Updated per EMR.   Pertinent Historical Findings include:  Past Medical History:  Diagnosis Date  . Anxiety   . Arthritis    "hands" (11/12/2017)  . Cataract    extraction surgery  . DVT (deep venous thrombosis) ([place]) 01/2016   LLE  . Fasting hyperglycemia    110 in 01/2011;131 in 05/2007  . Hypothyroidism   . Popliteal cyst   . Pulmonary embolism ([place]) 2017; 11/11/2017   "the one in 2017 was due to LLE DVT; don't why I had the one 11/11/2017"  . Renal insufficiency   . Tonsillar cancer ([place]) 1998    Past Surgical History:  Procedure Laterality Date  . CESAREAN SECTION  1984   G 1 P 2  . COLONOSCOPY  2003   negative; Dr Henrene Pastor  . JOINT REPLACEMENT    . LYMPHADENECTOMY Right 1998   neck  . RIGHT/LEFT HEART CATH AND CORONARY ANGIOGRAPHY N/A 03/02/2018   Procedure: RIGHT/LEFT HEART CATH AND CORONARY ANGIOGRAPHY;  Surgeon: Jolaine Artist, MD;  Location: [place] [facility];  Service: Cardiovascular;  Laterality: N/A;  . TEMPORARY PACEMAKER N/A 03/02/2018   Procedure: TEMPORARY PACEMAKER;   Surgeon: Jolaine Artist, MD;  Location: [place] [facility];  Service: Cardiovascular;  Laterality: N/A;  . TONSILLECTOMY AND ADENOIDECTOMY Right 1998   R tonsilar malignancy; subsequent LN dissection negative  . TOTAL ABDOMINAL HYSTERECTOMY  1980s   TOTAL ABDOMINAL HYSTERECTOMY W/ BILATERAL SALPINGOOPHORECTOMY [SHX83]; fibroids; Dr. Nori Riis  . TOTAL HIP ARTHROPLASTY Bilateral 2007   Dr Maureen Ralphs, both hips 3 months apart    No Known Allergies  Family History  Problem Relation Age of Onset  . Hyperlipidemia Mother   . Hypertension Mother   . Ovarian cancer Mother   . Aneurysm Mother        ascending & descending aorta  . Heart attack Father 50  . Leukemia Paternal Grandmother   . Lung cancer Paternal Grandfather   . Diabetes Neg Hx   . Stroke Neg Hx   . Colon cancer Neg Hx   . Stomach cancer Neg Hx   . Rectal cancer Neg Hx      Social History   Socioeconomic History  . Marital status: Married    Spouse name: Not on file  . Number of children: Not on file  . Years of education: Not on file  . Highest education level: Not on file  Occupational History  . Not on file  Social Needs  . Financial resource strain: Not on file  . Food insecurity:    Worry: Not on file  Inability: Not on file  . Transportation needs:    Medical: Not on file    Non-medical: Not on file  Tobacco Use  . Smoking status: Never Smoker  . Smokeless tobacco: Never Used  Substance and Sexual Activity  . Alcohol use: Yes    Alcohol/week: 1.8 oz    Types: 3 Cans of beer per week  . Drug use: No  . Sexual activity: Yes  Lifestyle  . Physical activity:    Days per week: Not on file    Minutes per session: Not on file  . Stress: Not on file  Relationships  . Social connections:    Talks on phone: Not on file    Gets together: Not on file    Attends religious service: Not on file    Active member of club or organization: Not on file    Attends meetings of clubs or organizations: Not on file     Relationship status: Not on file  . Intimate partner violence:    Fear of current or ex partner: Not on file    Emotionally abused: Not on file    Physically abused: Not on file    Forced sexual activity: Not on file  Other Topics Concern  . Not on file  Social History Narrative  . Not on file     PHYSICAL EXAM:  VS: BP (!) 102/58 (BP Location: Left Arm, Patient Position: Sitting, Cuff Size: Normal)   Pulse 86   Ht 5\' 9"  (1.753 m)   Wt 147 lb (66.7 kg)   SpO2 95%   BMI 21.71 kg/m  Physical Exam Gen: NAD, alert, cooperative with exam, well-appearing ENT: normal lips, normal nasal mucosa,  Eye: normal EOM, normal conjunctiva and lids CV:  no edema, +2 pedal pulses   Resp: no accessory muscle use, non-labored,  Skin: no rashes, no areas of induration  Neuro: normal tone, normal sensation to touch Psych:  normal insight, alert and oriented MSK:  Left and Right elbow:  No effusion  Normal ROM  Normal strength to resistance with flexion and extension  TTP along the insertion of the triceps and the lateral joint space recess  Neurovascularly intact      ASSESSMENT & PLAN:   Pain of both elbows Possible for triceps tendinitis vs inflammation of the joint capsule  - pennsaid samples provided  - counseled on HEP  - if no improvement consider imaging and/or PT

## 2018-05-15 NOTE — Assessment & Plan Note (Signed)
Possible for triceps tendinitis vs inflammation of the joint capsule  - pennsaid samples provided  - counseled on HEP  - if no improvement consider imaging and/or PT

## 2018-05-18 DIAGNOSIS — H35372 Puckering of macula, left eye: Secondary | ICD-10-CM | POA: Diagnosis not present

## 2018-05-25 DIAGNOSIS — I2699 Other pulmonary embolism without acute cor pulmonale: Secondary | ICD-10-CM | POA: Diagnosis not present

## 2018-05-25 DIAGNOSIS — Z7982 Long term (current) use of aspirin: Secondary | ICD-10-CM | POA: Diagnosis not present

## 2018-05-25 DIAGNOSIS — Z7901 Long term (current) use of anticoagulants: Secondary | ICD-10-CM | POA: Diagnosis not present

## 2018-05-25 DIAGNOSIS — I2724 Chronic thromboembolic pulmonary hypertension: Secondary | ICD-10-CM | POA: Diagnosis not present

## 2018-05-25 DIAGNOSIS — R1314 Dysphagia, pharyngoesophageal phase: Secondary | ICD-10-CM | POA: Diagnosis not present

## 2018-05-25 DIAGNOSIS — Z90710 Acquired absence of both cervix and uterus: Secondary | ICD-10-CM | POA: Diagnosis not present

## 2018-05-25 DIAGNOSIS — Z4659 Encounter for fitting and adjustment of other gastrointestinal appliance and device: Secondary | ICD-10-CM | POA: Diagnosis not present

## 2018-05-25 DIAGNOSIS — Z9981 Dependence on supplemental oxygen: Secondary | ICD-10-CM | POA: Diagnosis not present

## 2018-05-25 DIAGNOSIS — Z923 Personal history of irradiation: Secondary | ICD-10-CM | POA: Diagnosis not present

## 2018-05-25 DIAGNOSIS — I951 Orthostatic hypotension: Secondary | ICD-10-CM | POA: Diagnosis not present

## 2018-05-25 DIAGNOSIS — I5032 Chronic diastolic (congestive) heart failure: Secondary | ICD-10-CM | POA: Diagnosis not present

## 2018-05-25 DIAGNOSIS — J90 Pleural effusion, not elsewhere classified: Secondary | ICD-10-CM | POA: Diagnosis not present

## 2018-05-25 DIAGNOSIS — Z9889 Other specified postprocedural states: Secondary | ICD-10-CM | POA: Diagnosis not present

## 2018-05-25 DIAGNOSIS — D62 Acute posthemorrhagic anemia: Secondary | ICD-10-CM | POA: Diagnosis not present

## 2018-05-25 DIAGNOSIS — I442 Atrioventricular block, complete: Secondary | ICD-10-CM | POA: Diagnosis not present

## 2018-05-25 DIAGNOSIS — Z79899 Other long term (current) drug therapy: Secondary | ICD-10-CM | POA: Diagnosis not present

## 2018-05-25 DIAGNOSIS — R1312 Dysphagia, oropharyngeal phase: Secondary | ICD-10-CM | POA: Diagnosis not present

## 2018-05-25 DIAGNOSIS — Z96643 Presence of artificial hip joint, bilateral: Secondary | ICD-10-CM | POA: Diagnosis not present

## 2018-05-25 DIAGNOSIS — J9811 Atelectasis: Secondary | ICD-10-CM | POA: Diagnosis not present

## 2018-05-25 DIAGNOSIS — J9600 Acute respiratory failure, unspecified whether with hypoxia or hypercapnia: Secondary | ICD-10-CM | POA: Diagnosis not present

## 2018-05-25 DIAGNOSIS — I2721 Secondary pulmonary arterial hypertension: Secondary | ICD-10-CM | POA: Diagnosis not present

## 2018-05-25 DIAGNOSIS — Z86718 Personal history of other venous thrombosis and embolism: Secondary | ICD-10-CM | POA: Diagnosis not present

## 2018-05-25 DIAGNOSIS — I2782 Chronic pulmonary embolism: Secondary | ICD-10-CM | POA: Diagnosis not present

## 2018-05-25 DIAGNOSIS — G8918 Other acute postprocedural pain: Secondary | ICD-10-CM | POA: Diagnosis not present

## 2018-05-25 DIAGNOSIS — J811 Chronic pulmonary edema: Secondary | ICD-10-CM | POA: Diagnosis not present

## 2018-05-25 DIAGNOSIS — E039 Hypothyroidism, unspecified: Secondary | ICD-10-CM | POA: Diagnosis not present

## 2018-05-25 DIAGNOSIS — R918 Other nonspecific abnormal finding of lung field: Secondary | ICD-10-CM | POA: Diagnosis not present

## 2018-05-25 DIAGNOSIS — F419 Anxiety disorder, unspecified: Secondary | ICD-10-CM | POA: Diagnosis not present

## 2018-05-25 DIAGNOSIS — Z85818 Personal history of malignant neoplasm of other sites of lip, oral cavity, and pharynx: Secondary | ICD-10-CM | POA: Diagnosis not present

## 2018-06-06 MED ORDER — ASPIRIN 81 MG PO CHEW
81.00 | CHEWABLE_TABLET | ORAL | Status: DC
Start: 2018-06-07 — End: 2018-06-06

## 2018-06-06 MED ORDER — ONDANSETRON HCL 4 MG/2ML IJ SOLN
4.00 | INTRAMUSCULAR | Status: DC
Start: ? — End: 2018-06-06

## 2018-06-06 MED ORDER — LEVOTHYROXINE SODIUM 75 MCG PO TABS
75.00 | ORAL_TABLET | ORAL | Status: DC
Start: 2018-06-07 — End: 2018-06-06

## 2018-06-06 MED ORDER — DEXTROSE 50 % IV SOLN
12.50 | INTRAVENOUS | Status: DC
Start: ? — End: 2018-06-06

## 2018-06-06 MED ORDER — ACETAMINOPHEN 325 MG PO TABS
650.00 | ORAL_TABLET | ORAL | Status: DC
Start: ? — End: 2018-06-06

## 2018-06-06 MED ORDER — GENERIC EXTERNAL MEDICATION
1.00 | Status: DC
Start: ? — End: 2018-06-06

## 2018-06-06 MED ORDER — APIXABAN 5 MG PO TABS
5.00 | ORAL_TABLET | ORAL | Status: DC
Start: 2018-06-06 — End: 2018-06-06

## 2018-06-06 MED ORDER — SENNOSIDES-DOCUSATE SODIUM 8.6-50 MG PO TABS
2.00 | ORAL_TABLET | ORAL | Status: DC
Start: 2018-06-06 — End: 2018-06-06

## 2018-06-06 MED ORDER — PILOCARPINE HCL 5 MG PO TABS
5.00 | ORAL_TABLET | ORAL | Status: DC
Start: ? — End: 2018-06-06

## 2018-06-06 MED ORDER — VENLAFAXINE HCL ER 75 MG PO CP24
75.00 | ORAL_CAPSULE | ORAL | Status: DC
Start: 2018-06-07 — End: 2018-06-06

## 2018-06-06 MED ORDER — POTASSIUM CHLORIDE ER 10 MEQ PO TBCR
20.00 | EXTENDED_RELEASE_TABLET | ORAL | Status: DC
Start: 2018-06-06 — End: 2018-06-06

## 2018-06-06 MED ORDER — FUROSEMIDE 20 MG PO TABS
20.00 | ORAL_TABLET | ORAL | Status: DC
Start: 2018-06-06 — End: 2018-06-06

## 2018-06-06 MED ORDER — PANTOPRAZOLE SODIUM 40 MG PO TBEC
40.00 | DELAYED_RELEASE_TABLET | ORAL | Status: DC
Start: 2018-06-07 — End: 2018-06-06

## 2018-06-06 MED ORDER — MELATONIN 3 MG PO TABS
3.00 | ORAL_TABLET | ORAL | Status: DC
Start: 2018-06-06 — End: 2018-06-06

## 2018-06-06 MED ORDER — PILOCARPINE HCL 5 MG PO TABS
5.00 | ORAL_TABLET | ORAL | Status: DC
Start: 2018-06-06 — End: 2018-06-06

## 2018-06-06 MED ORDER — GABAPENTIN 100 MG PO CAPS
100.00 | ORAL_CAPSULE | ORAL | Status: DC
Start: 2018-06-06 — End: 2018-06-06

## 2018-06-08 ENCOUNTER — Other Ambulatory Visit: Payer: Self-pay | Admitting: Internal Medicine

## 2018-06-09 DIAGNOSIS — L03114 Cellulitis of left upper limb: Secondary | ICD-10-CM | POA: Diagnosis not present

## 2018-06-10 ENCOUNTER — Other Ambulatory Visit: Payer: Self-pay

## 2018-06-10 NOTE — Patient Outreach (Signed)
[place]) Care Management  06/10/2018  Danielle Pratt [birth year] [id number]   Referral received. No outreach warranted at this time. Transition of Care  will be completed by primary care provider office who will refer to [facility] care management if needed.  Plan: RN CM will close case.  Jone Baseman, RN, MSN [place] Management Care Management Coordinator Direct Line [phone] Cell [phone] Toll Free: [phone]  Fax: [phone]

## 2018-06-15 DIAGNOSIS — J449 Chronic obstructive pulmonary disease, unspecified: Secondary | ICD-10-CM | POA: Diagnosis not present

## 2018-06-18 DIAGNOSIS — I2724 Chronic thromboembolic pulmonary hypertension: Secondary | ICD-10-CM | POA: Diagnosis not present

## 2018-06-18 DIAGNOSIS — Z48812 Encounter for surgical aftercare following surgery on the circulatory system: Secondary | ICD-10-CM | POA: Diagnosis not present

## 2018-06-18 DIAGNOSIS — I4581 Long QT syndrome: Secondary | ICD-10-CM | POA: Diagnosis not present

## 2018-06-18 DIAGNOSIS — R1312 Dysphagia, oropharyngeal phase: Secondary | ICD-10-CM | POA: Diagnosis not present

## 2018-06-18 DIAGNOSIS — J9811 Atelectasis: Secondary | ICD-10-CM | POA: Diagnosis not present

## 2018-06-18 DIAGNOSIS — I459 Conduction disorder, unspecified: Secondary | ICD-10-CM | POA: Diagnosis not present

## 2018-06-19 ENCOUNTER — Ambulatory Visit: Payer: PPO

## 2018-06-24 ENCOUNTER — Other Ambulatory Visit: Payer: Self-pay | Admitting: Internal Medicine

## 2018-07-02 ENCOUNTER — Other Ambulatory Visit: Payer: Self-pay | Admitting: Internal Medicine

## 2018-07-15 ENCOUNTER — Telehealth: Payer: Self-pay | Admitting: Oncology

## 2018-07-15 NOTE — Telephone Encounter (Signed)
Returned pts call to cancel appts.  °

## 2018-07-16 ENCOUNTER — Inpatient Hospital Stay: Payer: PPO | Admitting: Oncology

## 2018-08-20 ENCOUNTER — Encounter (HOSPITAL_COMMUNITY): Payer: Self-pay | Admitting: Internal Medicine

## 2018-08-20 ENCOUNTER — Other Ambulatory Visit: Payer: Self-pay

## 2018-08-20 ENCOUNTER — Ambulatory Visit (HOSPITAL_COMMUNITY)
Admission: RE | Admit: 2018-08-20 | Discharge: 2018-08-20 | Disposition: A | Discharge status: Home / Self Care | Payer: PPO | Source: Ambulatory Visit | Attending: Internal Medicine | Admitting: Internal Medicine

## 2018-08-20 VITALS — BP 119/70 | HR 108 | Wt 145.6 lb

## 2018-08-20 DIAGNOSIS — I2699 Other pulmonary embolism without acute cor pulmonale: Secondary | ICD-10-CM | POA: Insufficient documentation

## 2018-08-20 DIAGNOSIS — Z7901 Long term (current) use of anticoagulants: Secondary | ICD-10-CM | POA: Insufficient documentation

## 2018-08-20 DIAGNOSIS — I2782 Chronic pulmonary embolism: Secondary | ICD-10-CM | POA: Insufficient documentation

## 2018-08-20 DIAGNOSIS — J9611 Chronic respiratory failure with hypoxia: Secondary | ICD-10-CM | POA: Diagnosis not present

## 2018-08-20 DIAGNOSIS — I2721 Secondary pulmonary arterial hypertension: Secondary | ICD-10-CM

## 2018-08-20 DIAGNOSIS — Z86718 Personal history of other venous thrombosis and embolism: Secondary | ICD-10-CM | POA: Insufficient documentation

## 2018-08-20 DIAGNOSIS — Z800 Family history of malignant neoplasm of digestive organs: Secondary | ICD-10-CM | POA: Diagnosis not present

## 2018-08-20 DIAGNOSIS — Z85818 Personal history of malignant neoplasm of other sites of lip, oral cavity, and pharynx: Secondary | ICD-10-CM | POA: Diagnosis not present

## 2018-08-20 DIAGNOSIS — Z8041 Family history of malignant neoplasm of ovary: Secondary | ICD-10-CM | POA: Diagnosis not present

## 2018-08-20 DIAGNOSIS — E039 Hypothyroidism, unspecified: Secondary | ICD-10-CM | POA: Diagnosis not present

## 2018-08-20 DIAGNOSIS — Z808 Family history of malignant neoplasm of other organs or systems: Secondary | ICD-10-CM | POA: Insufficient documentation

## 2018-08-20 DIAGNOSIS — I442 Atrioventricular block, complete: Secondary | ICD-10-CM | POA: Diagnosis not present

## 2018-08-20 DIAGNOSIS — I2724 Chronic thromboembolic pulmonary hypertension: Secondary | ICD-10-CM | POA: Diagnosis not present

## 2018-08-20 DIAGNOSIS — Z801 Family history of malignant neoplasm of trachea, bronchus and lung: Secondary | ICD-10-CM | POA: Insufficient documentation

## 2018-08-20 DIAGNOSIS — F419 Anxiety disorder, unspecified: Secondary | ICD-10-CM | POA: Insufficient documentation

## 2018-08-20 DIAGNOSIS — I447 Left bundle-branch block, unspecified: Secondary | ICD-10-CM

## 2018-08-20 DIAGNOSIS — M19042 Primary osteoarthritis, left hand: Secondary | ICD-10-CM | POA: Insufficient documentation

## 2018-08-20 DIAGNOSIS — I272 Pulmonary hypertension, unspecified: Secondary | ICD-10-CM

## 2018-08-20 DIAGNOSIS — Z8249 Family history of ischemic heart disease and other diseases of the circulatory system: Secondary | ICD-10-CM | POA: Diagnosis not present

## 2018-08-20 DIAGNOSIS — M19041 Primary osteoarthritis, right hand: Secondary | ICD-10-CM | POA: Diagnosis not present

## 2018-08-20 NOTE — Progress Notes (Signed)
Advanced Heart Failure Clinic Note   08/20/2018 Danielle Pratt [facility]   Nov 05, 1951  [id number]  Primary Physician Binnie Rail, MD Primary Cardiologist: Lorretta Harp MD [name], [place], [place]  HPI:  Danielle Pratt is a 67 y.o. thin and fit-appearing woman referred by Dr. 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 She is here with her husband Danielle Pratt.   She has also been followed by Dr. Lebron Conners , hematology oncology, for evaluation and treatment of chronic pulmonary thromboembolic diseases.   Denies any known h/o cardiac disease. 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 Smoked a little bit as a teenager but none since. Reports a superficial LLE clot in 1994. In March 2017 was diagnosed with a DVT and PE  that occurred in association with decreased mobility due to a brace applied for dislocation of an implanted hip. She was on oral anticoagulation for 6 months after that.   She had recurrent shortness of breath and fatigue in January 2019. Presented to ED and found to have bilateral segmental and subsegmental PE. No evidence of significant RV strain so not given thrombolytics. LE dopplers were negative. Started on Eliquis. Hypercoag w/u by Dr. Lebron Conners has been negative. She denies FHX of hypercoag disorders, PAH or CTEPH. No h/o CTD.   Observed 4/22 - 03/03/18 s/p cath. Cath showed cors and very mild PAH. Had iLBBB on baseline ECG but complete LBBB on ECG from am of procedure. During manipulation of the pigtail in her RV she developed CHB with no ventricular escape. TVP wire placed immediately but patient recovered spontaneously. Kept  for observation.  CTA chest 03/03/18 showed "Stable appearance of probable chronic bilateral pulmonary emboli are noted. No new or acute pulmonary emboli are noted. RV LV ratio is measured at 1.2 suggesting right heart strain. No other abnormality seen in the chest.  Underwent successful pulmonary thrombo-embolectomy with Dr.  Lincoln Brigham at [facility] in June 2019.  She returns today for [facility] follow up. Feels great. Off oxygen. Remains active around the house and walking without any problems. Mild SOB if she goes fast but otherwise ok. No edema. Sternum has healed well. No bleeding on Eliquis.    R/LHC 03/02/18 Ao =118/70 (91) LV = 127/12 RA = 2 RV = 47/3 PA = 46/17 (28) PCW = 16 Fick cardiac output/index = 4.5/2.4 PVR = 2.6 WU FA sat = 93% on 3L PA sat = 61%, 63%  Assessment: 1. Normal coronary arteries with left-dominant system 2. Normal LV function by echo 3. Very mild PAH due to CTEPH 4. Development of CHB with attempts to place pigtail catheter for pulmonary angiography - resolved spontaneously  VQ scan  02/13/18 1. High probability study for pulmonary thromboembolism. Multiple bilateral segmental perfusion defects, on the left partly matched with ventilatory defects, and with no corresponding chest radiographic abnormality.  Echo 2/19 LVEF 50-55% RV mildly dialted but systolic function I Feel is normal. Trivial TR. IVC ok   Review of systems complete and found to be negative unless listed in HPI.   Past Medical History:  Diagnosis Date  . Anxiety   . Arthritis    "hands" (11/12/2017)  . Cataract    extraction surgery  . DVT (deep venous thrombosis) ([place]) 01/2016   LLE  . Fasting hyperglycemia    110 in 01/2011;131 in 05/2007  . Hypothyroidism   . Popliteal cyst   . Pulmonary embolism ([place]) 2017; 11/11/2017   "the one in 2017 was due to LLE  DVT; don't why I had the one 11/11/2017"  . Renal insufficiency   . Tonsillar cancer ([place]) 1998     No outpatient medications have been marked as taking for the 08/20/18 encounter (Appointment) with Alain Deschene, Shaune Pascal, MD.     No Known Allergies  Social History   Socioeconomic History  . Marital status: Married    Spouse name: Not on file  . Number of children: Not on file  . Years of education: Not on file  . Highest education level: Not on file    Occupational History  . Not on file  Social Needs  . Financial resource strain: Not on file  . Food insecurity:    Worry: Not on file    Inability: Not on file  . Transportation needs:    Medical: Not on file    Non-medical: Not on file  Tobacco Use  . Smoking status: Never Smoker  . Smokeless tobacco: Never Used  Substance and Sexual Activity  . Alcohol use: Yes    Alcohol/week: 3.0 standard drinks    Types: 3 Cans of beer per week  . Drug use: No  . Sexual activity: Yes  Lifestyle  . Physical activity:    Days per week: Not on file    Minutes per session: Not on file  . Stress: Not on file  Relationships  . Social connections:    Talks on phone: Not on file    Gets together: Not on file    Attends religious service: Not on file    Active member of club or organization: Not on file    Attends meetings of clubs or organizations: Not on file    Relationship status: Not on file  . Intimate partner violence:    Fear of current or ex partner: Not on file    Emotionally abused: Not on file    Physically abused: Not on file    Forced sexual activity: Not on file  Other Topics Concern  . Not on file  Social History Narrative  . Not on file    Family History  Problem Relation Age of Onset  . Hyperlipidemia Mother   . Hypertension Mother   . Ovarian cancer Mother   . Aneurysm Mother        ascending & descending aorta  . Heart attack Father 55  . Leukemia Paternal Grandmother   . Lung cancer Paternal Grandfather   . Diabetes Neg Hx   . Stroke Neg Hx   . Colon cancer Neg Hx   . Stomach cancer Neg Hx   . Rectal cancer Neg Hx    There were no vitals filed for this visit. Wt Readings from Last 3 Encounters:  05/15/18 66.7 kg (147 lb)  04/30/18 69.9 kg (154 lb)  04/24/18 69.9 kg (154 lb)    Physical exam  General:  Well appearing. No resp difficulty HEENT: normal Neck: supple. no JVD. Carotids 2+ bilat; no bruits. No lymphadenopathy or thryomegaly  appreciated. Cor: Sternal well healed PMI nondisplaced. Regular rate & rhythm. No rubs, gallops or murmurs. Lungs: clear Abdomen: soft, nontender, nondistended. No hepatosplenomegaly. No bruits or masses. Good bowel sounds. Extremities: no cyanosis, clubbing, rash, edema Neuro: alert & orientedx3, cranial nerves grossly intact. moves all 4 extremities w/o difficulty. Affect pleasant   ASSESSMENT AND PLAN:  1. Chronic pulmonary emboli with CTEPH - RHC 03/02/18 with normal coronaries, normal LV function, very mild PAH due to CTEPH.  - Minimal RV strain on echo and  lack of significant TR jet.  - s/p pulmonary thrombo-embolectomy with Dr. Lincoln Brigham at [facility] in June 2019. - On eliquis 5 mg BID - Will do echo and RHC in December to assess for any residual PAH 2. Chronic hypoxic respiratory failure due to #1 - Off O2 after thrombo-embolectomy 3. Mild PAH - PA pressure 46/17 (28) - s/p pulmonary thrombo-embolectomy with Dr. Lincoln Brigham at [facility] in June 2019. - On eliquis 5 mg BID - Will do echo and RHC in December to assess for any residual PAH - Refer Pulmonary rehab  4. LBBB with intra-procedural CHB during attempted pulmonary angiography with pigtail catheter -stable  Glori Bickers, MD  10:48 AM

## 2018-08-20 NOTE — Patient Instructions (Signed)
Your physician has requested that you have an echocardiogram. Echocardiography is a painless test that uses sound waves to create images of your heart. It provides your doctor with information about the size and shape of your heart and how well your heart's chambers and valves are working. This procedure takes approximately one hour. There are no restrictions for this procedure.  Your physician has requested that you have a cardiac catheterization. Cardiac catheterization is used to diagnose and/or treat various heart conditions. Doctors may recommend this procedure for a number of different reasons. The most common reason is to evaluate chest pain. Chest pain can be a symptom of coronary artery disease (CAD), and cardiac catheterization can show whether plaque is narrowing or blocking your heart's arteries. This procedure is also used to evaluate the valves, as well as measure the blood flow and oxygen levels in different parts of your heart. For further information please visit [email or web address]. Please follow instruction sheet, as given.  You have been referred to Pulmonary Rehab, they will contact you for an appointment   PLEASE GIVE [place] A CALL WITH YOUR DATE PREFERENCE FOR December  Your physician recommends that you schedule a follow-up appointment in: 6 months with Dr Haroldine Laws

## 2018-08-20 NOTE — Addendum Note (Signed)
Encounter addended by: Kerry Dory, CMA on: 08/20/2018 11:03 AM  Actions taken: Visit diagnoses modified, Order list changed, Diagnosis association updated

## 2018-08-26 ENCOUNTER — Telehealth (HOSPITAL_COMMUNITY): Payer: Self-pay

## 2018-08-26 NOTE — Telephone Encounter (Signed)
Pt insurance is active and benefits verified through [employer]. Co-pay $15.00, DED $0.00/$0.00 met, out of pocket $3,400.00/$2,451.12 met, co-insurance 0%. No pre-authorization. Lori/[employer]A, 08/26/18 @ 3:50PM, REF# [id number]

## 2018-08-26 NOTE — Telephone Encounter (Signed)
Attempted to call patient in regards to Pulmonary Rehab - pt was not home left message with pt father to have her return call.

## 2018-08-29 ENCOUNTER — Other Ambulatory Visit: Payer: Self-pay | Admitting: Internal Medicine

## 2018-09-02 ENCOUNTER — Telehealth (HOSPITAL_COMMUNITY): Payer: Self-pay

## 2018-09-02 NOTE — Telephone Encounter (Signed)
Called and spoke with pt in regards to Pulmonary Rehab - scheduled orientation on 09/25/18 at 1:30pm. Pt will attend the 10:30am exc class. Mailed packet.

## 2018-09-04 ENCOUNTER — Other Ambulatory Visit: Payer: Self-pay

## 2018-09-07 ENCOUNTER — Encounter (HOSPITAL_COMMUNITY): Payer: Self-pay | Admitting: *Deleted

## 2018-09-16 ENCOUNTER — Other Ambulatory Visit (HOSPITAL_COMMUNITY): Payer: Self-pay | Admitting: *Deleted

## 2018-09-16 DIAGNOSIS — I272 Pulmonary hypertension, unspecified: Secondary | ICD-10-CM

## 2018-09-21 ENCOUNTER — Telehealth (HOSPITAL_COMMUNITY): Payer: Self-pay

## 2018-09-21 NOTE — Telephone Encounter (Signed)
Pt called and stated she needs to reschedule orientation on 09/25/18 at 1:30pm. Rescheduled appt to 10/23/18 at 9:30am.

## 2018-09-24 ENCOUNTER — Ambulatory Visit
Admission: RE | Admit: 2018-09-24 | Discharge: 2018-09-24 | Disposition: A | Discharge status: Home / Self Care | Payer: PPO | Source: Ambulatory Visit | Attending: Obstetrics & Gynecology | Admitting: Obstetrics & Gynecology

## 2018-09-24 DIAGNOSIS — Z1231 Encounter for screening mammogram for malignant neoplasm of breast: Secondary | ICD-10-CM

## 2018-09-25 ENCOUNTER — Ambulatory Visit (HOSPITAL_COMMUNITY): Payer: PPO

## 2018-09-28 ENCOUNTER — Other Ambulatory Visit: Payer: Self-pay | Admitting: Internal Medicine

## 2018-10-01 ENCOUNTER — Other Ambulatory Visit: Payer: Self-pay | Admitting: Internal Medicine

## 2018-10-15 ENCOUNTER — Telehealth (HOSPITAL_COMMUNITY): Payer: Self-pay

## 2018-10-15 NOTE — Telephone Encounter (Signed)
Pt called to reschedule her PR orientation, she will come in for orientation 11/16/2018 @ 1:30PM.

## 2018-10-22 ENCOUNTER — Other Ambulatory Visit: Payer: Self-pay

## 2018-10-22 ENCOUNTER — Ambulatory Visit (HOSPITAL_COMMUNITY)
Admission: RE | Admit: 2018-10-22 | Discharge: 2018-10-22 | Disposition: A | Discharge status: Home / Self Care | Payer: PPO | Source: Ambulatory Visit | Attending: Internal Medicine | Admitting: Internal Medicine

## 2018-10-22 ENCOUNTER — Encounter (HOSPITAL_COMMUNITY): Payer: Self-pay | Admitting: Internal Medicine

## 2018-10-22 ENCOUNTER — Encounter (HOSPITAL_COMMUNITY)
Admission: RE | Disposition: A | Discharge status: Home / Self Care | Payer: Self-pay | Source: Ambulatory Visit | Attending: Internal Medicine

## 2018-10-22 DIAGNOSIS — Z86718 Personal history of other venous thrombosis and embolism: Secondary | ICD-10-CM | POA: Diagnosis not present

## 2018-10-22 DIAGNOSIS — Z8249 Family history of ischemic heart disease and other diseases of the circulatory system: Secondary | ICD-10-CM | POA: Diagnosis not present

## 2018-10-22 DIAGNOSIS — Z7901 Long term (current) use of anticoagulants: Secondary | ICD-10-CM | POA: Insufficient documentation

## 2018-10-22 DIAGNOSIS — Z79890 Hormone replacement therapy (postmenopausal): Secondary | ICD-10-CM | POA: Diagnosis not present

## 2018-10-22 DIAGNOSIS — E039 Hypothyroidism, unspecified: Secondary | ICD-10-CM | POA: Diagnosis not present

## 2018-10-22 DIAGNOSIS — M19042 Primary osteoarthritis, left hand: Secondary | ICD-10-CM | POA: Diagnosis not present

## 2018-10-22 DIAGNOSIS — J9611 Chronic respiratory failure with hypoxia: Secondary | ICD-10-CM | POA: Diagnosis not present

## 2018-10-22 DIAGNOSIS — I447 Left bundle-branch block, unspecified: Secondary | ICD-10-CM | POA: Diagnosis not present

## 2018-10-22 DIAGNOSIS — I272 Pulmonary hypertension, unspecified: Secondary | ICD-10-CM

## 2018-10-22 DIAGNOSIS — I2782 Chronic pulmonary embolism: Secondary | ICD-10-CM | POA: Diagnosis not present

## 2018-10-22 DIAGNOSIS — I2724 Chronic thromboembolic pulmonary hypertension: Secondary | ICD-10-CM | POA: Diagnosis not present

## 2018-10-22 DIAGNOSIS — Z79899 Other long term (current) drug therapy: Secondary | ICD-10-CM | POA: Insufficient documentation

## 2018-10-22 DIAGNOSIS — I442 Atrioventricular block, complete: Secondary | ICD-10-CM | POA: Diagnosis not present

## 2018-10-22 DIAGNOSIS — M19041 Primary osteoarthritis, right hand: Secondary | ICD-10-CM | POA: Insufficient documentation

## 2018-10-22 DIAGNOSIS — Z85818 Personal history of malignant neoplasm of other sites of lip, oral cavity, and pharynx: Secondary | ICD-10-CM | POA: Insufficient documentation

## 2018-10-22 HISTORY — PX: RIGHT HEART CATH: CATH118263

## 2018-10-22 LAB — CBC
HCT: 40.6 % (ref 36.0–46.0)
Hemoglobin: 12.8 g/dL (ref 12.0–15.0)
MCH: 28.5 pg (ref 26.0–34.0)
MCHC: 31.5 g/dL (ref 30.0–36.0)
MCV: 90.4 fL (ref 80.0–100.0)
Platelets: 238 10*3/uL (ref 150–400)
RBC: 4.49 MIL/uL (ref 3.87–5.11)
RDW: 14.6 % (ref 11.5–15.5)
WBC: 5 10*3/uL (ref 4.0–10.5)
nRBC: 0 % (ref 0.0–0.2)

## 2018-10-22 LAB — POCT I-STAT 3, VENOUS BLOOD GAS (G3P V)
Acid-Base Excess: 3 mmol/L — ABNORMAL HIGH (ref 0.0–2.0)
Bicarbonate: 24.6 mmol/L (ref 20.0–28.0)
Bicarbonate: 28.1 mmol/L — ABNORMAL HIGH (ref 20.0–28.0)
O2 Saturation: 71 %
O2 Saturation: 67 %
TCO2: 26 mmol/L (ref 22–32)
TCO2: 29 mmol/L (ref 22–32)
pCO2, Ven: 40.4 mmHg — ABNORMAL LOW (ref 44.0–60.0)
pCO2, Ven: 45.3 mmHg (ref 44.0–60.0)
pH, Ven: 7.392 (ref 7.250–7.430)
pH, Ven: 7.4 (ref 7.250–7.430)
pO2, Ven: 38 mmHg (ref 32.0–45.0)
pO2, Ven: 35 mmHg (ref 32.0–45.0)

## 2018-10-22 LAB — BASIC METABOLIC PANEL
Anion gap: 11 (ref 5–15)
BUN: 18 mg/dL (ref 8–23)
CO2: 26 mmol/L (ref 22–32)
Calcium: 8.9 mg/dL (ref 8.9–10.3)
Chloride: 103 mmol/L (ref 98–111)
Creatinine, Ser: 0.96 mg/dL (ref 0.44–1.00)
GFR calc Af Amer: >60 mL/min (ref >60)
GFR calc non Af Amer: >60 mL/min (ref >60)
Glucose, Bld: 99 mg/dL (ref 70–99)
Potassium: 4.4 mmol/L (ref 3.5–5.1)
Sodium: 140 mmol/L (ref 135–145)

## 2018-10-22 SURGERY — RIGHT HEART CATH
Anesthesia: LOCAL

## 2018-10-22 MED ORDER — SODIUM CHLORIDE 0.9% FLUSH: 3.0000 mL | Freq: Two times a day (BID) | INTRAVENOUS | Status: DC

## 2018-10-22 MED ORDER — SODIUM CHLORIDE 0.9% FLUSH: 3.0000 mL | INTRAVENOUS | Status: DC | PRN

## 2018-10-22 MED ORDER — SODIUM CHLORIDE 0.9 % IV SOLN: 250.0000 mL | INTRAVENOUS | Status: DC | PRN

## 2018-10-22 MED ORDER — SODIUM CHLORIDE 0.9 % IV SOLN
INTRAVENOUS | Status: DC
  Administered 2018-10-22: 09:00:00 via INTRAVENOUS

## 2018-10-22 MED ORDER — LIDOCAINE HCL (PF) 1 % IJ SOLN
INTRAMUSCULAR | Status: AC
  Filled 2018-10-22: qty 30

## 2018-10-22 MED ORDER — ONDANSETRON HCL 4 MG/2ML IJ SOLN: 4.0000 mg | Freq: Four times a day (QID) | INTRAMUSCULAR | Status: DC | PRN

## 2018-10-22 MED ORDER — ACETAMINOPHEN 325 MG PO TABS: 650.0000 mg | ORAL_TABLET | ORAL | Status: DC | PRN

## 2018-10-22 MED ORDER — HEPARIN (PORCINE) IN NACL 1000-0.9 UT/500ML-% IV SOLN
INTRAVENOUS | Status: DC | PRN
  Administered 2018-10-22: 500 mL

## 2018-10-22 MED ORDER — LIDOCAINE HCL (PF) 1 % IJ SOLN
INTRAMUSCULAR | Status: DC | PRN
  Administered 2018-10-22: 2 mL

## 2018-10-22 MED ORDER — HEPARIN (PORCINE) IN NACL 1000-0.9 UT/500ML-% IV SOLN
INTRAVENOUS | Status: AC
  Filled 2018-10-22: qty 500

## 2018-10-22 MED ORDER — ASPIRIN 81 MG PO CHEW
81.0000 mg | CHEWABLE_TABLET | ORAL | Status: AC
  Administered 2018-10-22: 81 mg via ORAL
  Filled 2018-10-22: qty 1

## 2018-10-22 SURGICAL SUPPLY — 6 items
CATH BALLN WEDGE 5F 110CM (CATHETERS) ×2 IMPLANT
PACK CARDIAC CATHETERIZATION (CUSTOM PROCEDURE TRAY) ×2 IMPLANT
SHEATH GLIDE SLENDER 4/5FR (SHEATH) ×2 IMPLANT
TRANSDUCER W/STOPCOCK (MISCELLANEOUS) ×2 IMPLANT
TUBING ART PRESS 72  MALE/FEM (TUBING) ×1
TUBING ART PRESS 72 MALE/FEM (TUBING) ×1 IMPLANT

## 2018-10-22 NOTE — H&P (Signed)
Advanced Heart Failure H&P   10/22/2018 Danielle Pratt   1951-03-07  [id number]  Primary Physician Binnie Rail, MD Primary Cardiologist: Lorretta Harp MD [name], [place], [place]  HPI:  Danielle Pratt is a 67 y.o. with CTEPH s/p successful pulmonary thrombo-embolectomy with Dr. Lincoln Brigham at [facility] in June 2019.  Cath 4/19 showed normal cors and very mild PAH. Had iLBBB on baseline ECG but complete LBBB on ECG from am of procedure. During manipulation of the pigtail in her RV she developed CHB with no ventricular escape. TVP wire placed immediately but patient recovered spontaneously. Kept  for observation.  CTA chest 03/03/18 showed "Stable appearance of probable chronic bilateral pulmonary emboli are noted. No new or acute pulmonary emboli are noted. RV LV ratio is measured at 1.2 suggesting right heart strain. No other abnormality seen in the chest.  Underwent successful pulmonary thrombo-embolectomy with Dr. Lincoln Brigham at [facility] in June 2019.  I saw her recently for [facility] follow up. Feeling great. Off oxygen. NYHA I. Scheduled for f/u RHC today.    R/LHC 03/02/18 Ao =118/70 (91) LV = 127/12 RA = 2 RV = 47/3 PA = 46/17 (28) PCW = 16 Fick cardiac output/index = 4.5/2.4 PVR = 2.6 WU FA sat = 93% on 3L PA sat = 61%, 63%  Assessment: 1. Normal coronary arteries with left-dominant system 2. Normal LV function by echo 3. Very mild PAH due to CTEPH 4. Development of CHB with attempts to place pigtail catheter for pulmonary angiography - resolved spontaneously  VQ scan  02/13/18 1. High probability study for pulmonary thromboembolism. Multiple bilateral segmental perfusion defects, on the left partly matched with ventilatory defects, and with no corresponding chest radiographic abnormality.  Echo 2/19 LVEF 50-55% RV mildly dialted but systolic function I Feel is normal. Trivial TR. IVC ok   Review of systems complete and found to be negative unless listed in HPI.   Past  Medical History:  Diagnosis Date  . Anxiety   . Arthritis    "hands" (11/12/2017)  . Cataract    extraction surgery  . DVT (deep venous thrombosis) ([place]) 01/2016   LLE  . Fasting hyperglycemia    110 in 01/2011;131 in 05/2007  . Hypothyroidism   . Popliteal cyst   . Pulmonary embolism ([place]) 2017; 11/11/2017   "the one in 2017 was due to LLE DVT; don't why I had the one 11/11/2017"  . Renal insufficiency   . Tonsillar cancer ([place]) 1998     Current Meds  Medication Sig  . apixaban (ELIQUIS) 5 MG TABS tablet Take 1 tablet (5 mg total) by mouth 2 (two) times daily. -- Office visit needed for further refills  . clobetasol cream (TEMOVATE) 3.87 % Apply 1 application topically at bedtime as needed (for skin irritation/finger).   [name] levothyroxine (SYNTHROID, LEVOTHROID) 50 MCG tablet TAKE 1 AND A HALF TABLETS BY MOUTH DAILY BEFORE BREAKFAST (Patient taking differently: Take 75 mcg by mouth daily before breakfast. )  . pilocarpine (SALAGEN) 5 MG tablet Take 5 mg by mouth See admin instructions. Take 1 tablet (5 mg) by mouth 3 times daily - early morning, midday and bedtime, may take an additional tablet during the day as needed for dry mouth.  . tretinoin (RETIN-A) 0.05 % cream Apply 1 application topically at bedtime.  [name] venlafaxine XR (EFFEXOR-XR) 75 MG 24 hr capsule TAKE ONE CAPSULE BY MOUTH EVERY NIGHT AT BEDTIME (Patient taking differently: Take 75 mg by mouth at bedtime. )  No Known Allergies  Social History   Socioeconomic History  . Marital status: Married    Spouse name: Not on file  . Number of children: Not on file  . Years of education: Not on file  . Highest education level: Not on file  Occupational History  . Not on file  Social Needs  . Financial resource strain: Not on file  . Food insecurity:    Worry: Not on file    Inability: Not on file  . Transportation needs:    Medical: Not on file    Non-medical: Not on file  Tobacco Use  . Smoking status: Never Smoker    . Smokeless tobacco: Never Used  Substance and Sexual Activity  . Alcohol use: Yes    Alcohol/week: 3.0 standard drinks    Types: 3 Cans of beer per week  . Drug use: No  . Sexual activity: Yes  Lifestyle  . Physical activity:    Days per week: Not on file    Minutes per session: Not on file  . Stress: Not on file  Relationships  . Social connections:    Talks on phone: Not on file    Gets together: Not on file    Attends religious service: Not on file    Active member of club or organization: Not on file    Attends meetings of clubs or organizations: Not on file    Relationship status: Not on file  . Intimate partner violence:    Fear of current or ex partner: Not on file    Emotionally abused: Not on file    Physically abused: Not on file    Forced sexual activity: Not on file  Other Topics Concern  . Not on file  Social History Narrative  . Not on file    Family History  Problem Relation Age of Onset  . Hyperlipidemia Mother   . Hypertension Mother   . Ovarian cancer Mother   . Aneurysm Mother        ascending & descending aorta  . Heart attack Father 95  . Leukemia Paternal Grandmother   . Lung cancer Paternal Grandfather   . Diabetes Neg Hx   . Stroke Neg Hx   . Colon cancer Neg Hx   . Stomach cancer Neg Hx   . Rectal cancer Neg Hx   . Breast cancer Neg Hx    There were no vitals filed for this visit. Wt Readings from Last 3 Encounters:  08/20/18 66 kg  05/15/18 66.7 kg  04/30/18 69.9 kg    Physical exam  General:  Well appearing. No resp difficulty HEENT: normal Neck: supple. no JVD. Carotids 2+ bilat; no bruits. No lymphadenopathy or thryomegaly appreciated. Cor: PMI nondisplaced. Regular rate & rhythm. No rubs, gallops or murmurs. Lungs: clear Abdomen: soft, nontender, nondistended. No hepatosplenomegaly. No bruits or masses. Good bowel sounds. Extremities: no cyanosis, clubbing, rash, edema Neuro: alert & orientedx3, cranial nerves grossly  intact. moves all 4 extremities w/o difficulty. Affect pleasant    ASSESSMENT AND PLAN:  1. Chronic pulmonary emboli with CTEPH - RHC 03/02/18 with normal coronaries, normal LV function, very mild PAH due to CTEPH.  - Minimal RV strain on echo and lack of significant TR jet.  - s/p pulmonary thrombo-embolectomy with Dr. Lincoln Brigham at [facility] in June 2019. - On eliquis 5 mg BID - RHC today to assess for any residual PAH 2. Chronic hypoxic respiratory failure due to #1 - Off O2 after thrombo-embolectomy  3. Mild PAH - PA pressure 46/17 (28) - s/p pulmonary thrombo-embolectomy with Dr. Lincoln Brigham at [facility] in June 2019. - On eliquis 5 mg BID - RHC today - Refer Pulmonary rehab  4. LBBB with intra-procedural CHB during attempted pulmonary angiography with pigtail catheter -stable  Glori Bickers, MD  8:21 AM

## 2018-10-22 NOTE — Discharge Instructions (Signed)
Brachial Site Care Refer to this sheet in the next few weeks. These instructions provide you with information about caring for yourself after your procedure. Your health care provider may also give you more specific instructions. Your treatment has been planned according to current medical practices, but problems sometimes occur. Call your health care provider if you have any problems or questions after your procedure. What can I expect after the procedure? After your procedure, it is typical to have the following:  Bruising at the brachial site that usually fades within 1-2 weeks.  Blood collecting in the tissue (hematoma) that may be painful to the touch. It should usually decrease in size and tenderness within 1-2 weeks.  Follow these instructions at home:  Take medicines only as directed by your health care provider.  You may shower 24-48 hours after the procedure or as directed by your health care provider. Remove the bandage (dressing) and gently wash the site with plain soap and water. Pat the area dry with a clean towel. Do not rub the site, because this may cause bleeding.  Do not take baths, swim, or use a hot tub until your health care provider approves.  Check your insertion site every day for redness, swelling, or drainage.  Do not apply powder or lotion to the site.  Do not flex or bend the affected arm for 24 hours or as directed by your health care provider.  Do not push or pull heavy objects with the affected arm for 24 hours or as directed by your health care provider.  Do not lift over 10 lb (4.5 kg) for 3 days after your procedure or as directed by your health care provider.  Ask your health care provider when it is okay to: ? Return to work or school. ? Resume usual physical activities or sports. ? Resume sexual activity.  Do not drive home if you are discharged the same day as the procedure. Have someone else drive you.  You may drive 24 hours after the  procedure unless otherwise instructed by your health care provider.  Do not operate machinery or power tools for 24 hours after the procedure.  If your procedure was done as an outpatient procedure, which means that you went home the same day as your procedure, a responsible adult should be with you for the first 24 hours after you arrive home.  Keep all follow-up visits as directed by your health care provider. This is important. Contact a health care provider if:  You have a fever.  You have chills.  You have increased bleeding from the radial site. Hold pressure on the site. Get help right away if:  You have unusual pain at the radial site.  You have redness, warmth, or swelling at the radial site.  You have drainage (other than a small amount of blood on the dressing) from the radial site.  The radial site is bleeding, and the bleeding does not stop after 30 minutes of holding steady pressure on the site.  Your arm or hand becomes pale, cool, tingly, or numb. This information is not intended to replace advice given to you by your health care provider. Make sure you discuss any questions you have with your health care provider. Document Released: 11/30/2010 Document Revised: 04/04/2016 Document Reviewed: 05/16/2014 Elsevier Interactive Patient Education  2018 [employer].

## 2018-10-23 ENCOUNTER — Ambulatory Visit (HOSPITAL_COMMUNITY): Payer: PPO

## 2018-11-03 ENCOUNTER — Ambulatory Visit (HOSPITAL_COMMUNITY)
Admission: RE | Admit: 2018-11-03 | Discharge: 2018-11-03 | Disposition: A | Discharge status: Home / Self Care | Payer: PPO | Source: Ambulatory Visit | Attending: Internal Medicine | Admitting: Internal Medicine

## 2018-11-03 ENCOUNTER — Encounter (HOSPITAL_COMMUNITY): Payer: Self-pay | Admitting: Internal Medicine

## 2018-11-03 ENCOUNTER — Ambulatory Visit (HOSPITAL_BASED_OUTPATIENT_CLINIC_OR_DEPARTMENT_OTHER)
Admission: RE | Admit: 2018-11-03 | Discharge: 2018-11-03 | Disposition: A | Discharge status: Home / Self Care | Payer: PPO | Source: Ambulatory Visit

## 2018-11-03 ENCOUNTER — Other Ambulatory Visit (HOSPITAL_COMMUNITY): Payer: PPO

## 2018-11-03 VITALS — BP 120/80 | HR 92 | Wt 149.6 lb

## 2018-11-03 DIAGNOSIS — I2699 Other pulmonary embolism without acute cor pulmonale: Secondary | ICD-10-CM | POA: Insufficient documentation

## 2018-11-03 DIAGNOSIS — Z86718 Personal history of other venous thrombosis and embolism: Secondary | ICD-10-CM | POA: Diagnosis not present

## 2018-11-03 DIAGNOSIS — Z79899 Other long term (current) drug therapy: Secondary | ICD-10-CM | POA: Diagnosis not present

## 2018-11-03 DIAGNOSIS — I2724 Chronic thromboembolic pulmonary hypertension: Secondary | ICD-10-CM

## 2018-11-03 DIAGNOSIS — I442 Atrioventricular block, complete: Secondary | ICD-10-CM | POA: Diagnosis not present

## 2018-11-03 DIAGNOSIS — I371 Nonrheumatic pulmonary valve insufficiency: Secondary | ICD-10-CM | POA: Diagnosis not present

## 2018-11-03 DIAGNOSIS — Z79890 Hormone replacement therapy (postmenopausal): Secondary | ICD-10-CM | POA: Diagnosis not present

## 2018-11-03 DIAGNOSIS — Z7901 Long term (current) use of anticoagulants: Secondary | ICD-10-CM | POA: Diagnosis not present

## 2018-11-03 DIAGNOSIS — J9611 Chronic respiratory failure with hypoxia: Secondary | ICD-10-CM | POA: Insufficient documentation

## 2018-11-03 DIAGNOSIS — I447 Left bundle-branch block, unspecified: Secondary | ICD-10-CM | POA: Diagnosis not present

## 2018-11-03 DIAGNOSIS — I2721 Secondary pulmonary arterial hypertension: Secondary | ICD-10-CM | POA: Diagnosis not present

## 2018-11-03 DIAGNOSIS — E039 Hypothyroidism, unspecified: Secondary | ICD-10-CM | POA: Insufficient documentation

## 2018-11-03 DIAGNOSIS — I071 Rheumatic tricuspid insufficiency: Secondary | ICD-10-CM | POA: Diagnosis not present

## 2018-11-03 DIAGNOSIS — I272 Pulmonary hypertension, unspecified: Secondary | ICD-10-CM | POA: Diagnosis not present

## 2018-11-03 DIAGNOSIS — Z9882 Breast implant status: Secondary | ICD-10-CM | POA: Insufficient documentation

## 2018-11-03 DIAGNOSIS — I2782 Chronic pulmonary embolism: Secondary | ICD-10-CM | POA: Insufficient documentation

## 2018-11-03 DIAGNOSIS — Z85818 Personal history of malignant neoplasm of other sites of lip, oral cavity, and pharynx: Secondary | ICD-10-CM | POA: Diagnosis not present

## 2018-11-03 DIAGNOSIS — F419 Anxiety disorder, unspecified: Secondary | ICD-10-CM | POA: Diagnosis not present

## 2018-11-03 MED ORDER — APIXABAN 5 MG PO TABS: 5.0000 mg | ORAL_TABLET | Freq: Two times a day (BID) | ORAL | 11 refills | Status: DC

## 2018-11-03 NOTE — Progress Notes (Signed)
Advanced Heart Failure Clinic Note   11/03/2018 Ala Kratz   20-Apr-1951  [id number]  Primary Physician Binnie Rail, MD Primary Cardiologist: Lorretta Harp MD [name], [place], [place]  HPI:  Danielle Pratt is a 67 y.o. thin and fit-appearing woman referred by Dr. 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 She is here with her husband Danielle Pratt.   She has also been followed by Dr. Lebron Conners , hematology oncology, for evaluation and treatment of chronic pulmonary thromboembolic diseases.   Denies any known h/o cardiac disease. 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 Smoked a little bit as a teenager but none since. Reports a superficial LLE clot in 1994. In March 2017 was diagnosed with a DVT and PE  that occurred in association with decreased mobility due to a brace applied for dislocation of an implanted hip. She was on oral anticoagulation for 6 months after that.   She had recurrent shortness of breath and fatigue in January 2019. Presented to ED and found to have bilateral segmental and subsegmental PE. No evidence of significant RV strain so not given thrombolytics. LE dopplers were negative. Started on Eliquis. Hypercoag w/u by Dr. Lebron Conners has been negative. She denies FHX of hypercoag disorders, PAH or CTEPH. No h/o CTD.   Observed 4/22 - 03/03/18 s/p cath. Cath showed cors and very mild PAH. Had iLBBB on baseline ECG but complete LBBB on ECG from am of procedure. During manipulation of the pigtail in her RV she developed CHB with no ventricular escape. TVP wire placed immediately but patient recovered spontaneously. Kept  for observation.  CTA chest 03/03/18 showed "Stable appearance of probable chronic bilateral pulmonary emboli are noted. No new or acute pulmonary emboli are noted. RV LV ratio is measured at 1.2 suggesting right heart strain. No other abnormality seen in the chest.  Underwent successful pulmonary thrombo-embolectomy with Dr.  Lincoln Brigham at [facility] in June 2019.  She returns today for [facility] follow up. Feels great. Off oxygen. Remains active. No SOB or dizziness. No bleeding on Eliquis. RHC earlier this month was normal.   Echo today personally reviewed LVEF 55% RV ok with mild septal flattening Personally reviewed   RHC 10/22/18 RA = 1 RV = 26/2 PA = 29/7 (17) PCW = 3 Fick cardiac output/index = 4.7/2.6 PVR = 3.0 WU Ao sat = 99% PA sat = 67%, 71%    R/LHC 03/02/18 Ao =118/70 (91) LV = 127/12 RA = 2 RV = 47/3 PA = 46/17 (28) PCW = 16 Fick cardiac output/index = 4.5/2.4 PVR = 2.6 WU FA sat = 93% on 3L PA sat = 61%, 63%  Assessment: 1. Normal coronary arteries with left-dominant system 2. Normal LV function by echo 3. Very mild PAH due to CTEPH 4. Development of CHB with attempts to place pigtail catheter for pulmonary angiography - resolved spontaneously  VQ scan  02/13/18 1. High probability study for pulmonary thromboembolism. Multiple bilateral segmental perfusion defects, on the left partly matched with ventilatory defects, and with no corresponding chest radiographic abnormality.  Echo 2/19 LVEF 50-55% RV mildly dialted but systolic function is normal. Trivial TR. IVC ok   Review of systems complete and found to be negative unless listed in HPI.   Past Medical History:  Diagnosis Date  . Anxiety   . Arthritis    "hands" (11/12/2017)  . Cataract    extraction surgery  . DVT (deep venous thrombosis) ([place]) 01/2016   LLE  .  Fasting hyperglycemia    110 in 01/2011;131 in 05/2007  . Hypothyroidism   . Popliteal cyst   . Pulmonary embolism ([place]) 2017; 11/11/2017   "the one in 2017 was due to LLE DVT; don't why I had the one 11/11/2017"  . Renal insufficiency   . Tonsillar cancer ([place]) 1998     Current Meds  Medication Sig  . apixaban (ELIQUIS) 5 MG TABS tablet Take 1 tablet (5 mg total) by mouth 2 (two) times daily. -- Office visit needed for further refills  . clobetasol cream  (TEMOVATE) 4.85 % Apply 1 application topically at bedtime as needed (for skin irritation/finger).   [name] levothyroxine (SYNTHROID, LEVOTHROID) 50 MCG tablet TAKE 1 AND A HALF TABLETS BY MOUTH DAILY BEFORE BREAKFAST (Patient taking differently: Take 75 mcg by mouth daily before breakfast. )  . pilocarpine (SALAGEN) 5 MG tablet Take 5 mg by mouth See admin instructions. Take 1 tablet (5 mg) by mouth 3 times daily - early morning, midday and bedtime, may take an additional tablet during the day as needed for dry mouth.  . tretinoin (RETIN-A) 0.05 % cream Apply 1 application topically at bedtime.  [name] venlafaxine XR (EFFEXOR-XR) 75 MG 24 hr capsule TAKE ONE CAPSULE BY MOUTH EVERY NIGHT AT BEDTIME (Patient taking differently: Take 75 mg by mouth at bedtime. )     No Known Allergies  Social History   Socioeconomic History  . Marital status: Married    Spouse name: Not on file  . Number of children: Not on file  . Years of education: Not on file  . Highest education level: Not on file  Occupational History  . Not on file  Social Needs  . Financial resource strain: Not on file  . Food insecurity:    Worry: Not on file    Inability: Not on file  . Transportation needs:    Medical: Not on file    Non-medical: Not on file  Tobacco Use  . Smoking status: Never Smoker  . Smokeless tobacco: Never Used  Substance and Sexual Activity  . Alcohol use: Yes    Alcohol/week: 3.0 standard drinks    Types: 3 Cans of beer per week  . Drug use: No  . Sexual activity: Yes  Lifestyle  . Physical activity:    Days per week: Not on file    Minutes per session: Not on file  . Stress: Not on file  Relationships  . Social connections:    Talks on phone: Not on file    Gets together: Not on file    Attends religious service: Not on file    Active member of club or organization: Not on file    Attends meetings of clubs or organizations: Not on file    Relationship status: Not on file  . Intimate partner  violence:    Fear of current or ex partner: Not on file    Emotionally abused: Not on file    Physically abused: Not on file    Forced sexual activity: Not on file  Other Topics Concern  . Not on file  Social History Narrative  . Not on file    Family History  Problem Relation Age of Onset  . Hyperlipidemia Mother   . Hypertension Mother   . Ovarian cancer Mother   . Aneurysm Mother        ascending & descending aorta  . Heart attack Father 77  . Leukemia Paternal Grandmother   . Lung cancer  Paternal Grandfather   . Diabetes Neg Hx   . Stroke Neg Hx   . Colon cancer Neg Hx   . Stomach cancer Neg Hx   . Rectal cancer Neg Hx   . Breast cancer Neg Hx    Vitals:   11/03/18 1105  BP: 120/80  Pulse: 92  SpO2: 95%  Weight: 67.9 kg (149 lb 9.6 oz)   Wt Readings from Last 3 Encounters:  11/03/18 67.9 kg (149 lb 9.6 oz)  10/22/18 68 kg (150 lb)  08/20/18 66 kg (145 lb 9.6 oz)    Physical exam  General:  Well appearing. No resp difficulty HEENT: normal Neck: supple. no JVD. Carotids 2+ bilat; no bruits. No lymphadenopathy or thryomegaly appreciated. Cor: PMI nondisplaced. Regular rate & rhythm. No rubs, gallops or murmurs. Lungs: clear Abdomen: soft, nontender, nondistended. No hepatosplenomegaly. No bruits or masses. Good bowel sounds. Extremities: no cyanosis, clubbing, rash, edema Neuro: alert & orientedx3, cranial nerves grossly intact. moves all 4 extremities w/o difficulty. Affect pleasant   ASSESSMENT AND PLAN:  1. Chronic pulmonary emboli with CTEPH - RHC 03/02/18 with normal coronaries, normal LV function, very mild PAH due to CTEPH.  - Minimal RV strain on echo and lack of significant TR jet.  - s/p pulmonary thrombo-embolectomy with Dr. Lincoln Brigham at [facility] in June 2019. - On eliquis 5 mg BID - RHC 12/19 with completely normalized pressures - Echo today with EF 55% RV mildly dilated with mild septal flattening  - On echo there is mild septal flattening but pressures  normal by cath. Some of septal abnormality likely due to LBBB - She is doing great will see back in 1 year with f/u echo 2. Chronic hypoxic respiratory failure due to #1 - Off O2 after thrombo-embolectomy 3. Mild PAH - PA pressure 46/17 (28) pre-surgery - RHC post surgery now with normal PAP - s/p pulmonary thrombo-embolectomy with Dr. Lincoln Brigham at [facility] in June 2019. - On eliquis 5 mg BID 4. LBBB with intra-procedural CHB during attempted pulmonary angiography with pigtail catheter -stable  Glori Bickers, MD  11:36 AM

## 2018-11-03 NOTE — Patient Instructions (Signed)
Your physician recommends that you schedule a follow-up appointment in: 1 year.  A refill for eliquis has been sent to your pharmacy.

## 2018-11-03 NOTE — Addendum Note (Signed)
Encounter addended by: Valeda Malm, RN on: 11/03/2018 11:46 AM  Actions taken: Order list changed, Clinical Note Signed

## 2018-11-03 NOTE — Progress Notes (Signed)
  Echocardiogram 2D Echocardiogram has been performed.  Danielle Pratt 11/03/2018, 10:56 AM

## 2018-11-05 ENCOUNTER — Other Ambulatory Visit: Payer: Self-pay | Admitting: Internal Medicine

## 2018-11-10 ENCOUNTER — Telehealth (HOSPITAL_COMMUNITY): Payer: Self-pay

## 2018-11-11 ENCOUNTER — Encounter (HOSPITAL_COMMUNITY): Payer: Self-pay | Admitting: Emergency Medicine

## 2018-11-11 ENCOUNTER — Ambulatory Visit (HOSPITAL_COMMUNITY)
Admission: EM | Admit: 2018-11-11 | Discharge: 2018-11-11 | Disposition: A | Discharge status: Home / Self Care | Payer: PPO | Attending: Family Medicine | Admitting: Family Medicine

## 2018-11-11 DIAGNOSIS — W260XXA Contact with knife, initial encounter: Secondary | ICD-10-CM

## 2018-11-11 DIAGNOSIS — S61213A Laceration without foreign body of left middle finger without damage to nail, initial encounter: Secondary | ICD-10-CM | POA: Diagnosis not present

## 2018-11-11 DIAGNOSIS — Z23 Encounter for immunization: Secondary | ICD-10-CM

## 2018-11-11 MED ORDER — TETANUS-DIPHTH-ACELL PERTUSSIS 5-2.5-18.5 LF-MCG/0.5 IM SUSP
0.5000 mL | Freq: Once | INTRAMUSCULAR | Status: AC
  Administered 2018-11-11: 0.5 mL via INTRAMUSCULAR

## 2018-11-11 MED ORDER — TETANUS-DIPHTH-ACELL PERTUSSIS 5-2.5-18.5 LF-MCG/0.5 IM SUSP
INTRAMUSCULAR | Status: AC
  Filled 2018-11-11: qty 0.5

## 2018-11-11 NOTE — Discharge Instructions (Addendum)
Meds ordered this encounter  Medications   Tdap (BOOSTRIX) injection 0.5 mL    

## 2018-11-11 NOTE — ED Triage Notes (Signed)
Pt states she was cutting ham with a sharp knife and cut her left middle finger. Pt requesting tetanus shot.

## 2018-11-13 NOTE — ED Provider Notes (Signed)
[facility]   [id number] 11/11/18 Arrival Time: 1924  ASSESSMENT & PLAN:  1. Laceration of left middle finger without foreign body without damage to nail, initial encounter     Meds ordered this encounter  Medications  . Tdap (BOOSTRIX) injection 0.5 mL   No repair needed. No active bleeding. Normal distal finger sensation. Simple wound care discussed. Watch for s/s of infection.  Reviewed expectations re: course of current medical issues. Questions answered. Outlined signs and symptoms indicating need for more acute intervention. Patient verbalized understanding. After Visit Summary given.   SUBJECTIVE:  Danielle Pratt is a 68 y.o. female who presents with a laceration of her L 3rd finger. 1-2 hours ago. Moderate bleeding initially that has ceased. No sensation changes. Is painful. No ROM loss. No nail involvement. Cut with kitchen knife.  Td UTD: No.  ROS: As per HPI.   OBJECTIVE:  Vitals:   11/11/18 1953  BP: (!) 147/82  Pulse: 96  Resp: 18  Temp: 98.1 F (36.7 C)  SpO2: 94%    General appearance: alert; no distress Skin: laceration of distal L 3rd finger at fingertip; size: approx 1 cm; linear; no nail involvement; no active bleeding; normal capillary refill of L 3rd finger; normal distal sensation Psychological: alert and cooperative; normal mood and affect   No Known Allergies  Past Medical History:  Diagnosis Date  . Anxiety   . Arthritis    "hands" (11/12/2017)  . Cataract    extraction surgery  . DVT (deep venous thrombosis) ([place]) 01/2016   LLE  . Fasting hyperglycemia    110 in 01/2011;131 in 05/2007  . Hypothyroidism   . Popliteal cyst   . Pulmonary embolism ([place]) 2017; 11/11/2017   "the one in 2017 was due to LLE DVT; don't why I had the one 11/11/2017"  . Renal insufficiency   . Tonsillar cancer ([place]) 1998   Social History   Socioeconomic History  . Marital status: Married    Spouse name: Not on file  . Number of children:  Not on file  . Years of education: Not on file  . Highest education level: Not on file  Occupational History  . Not on file  Social Needs  . Financial resource strain: Not on file  . Food insecurity:    Worry: Not on file    Inability: Not on file  . Transportation needs:    Medical: Not on file    Non-medical: Not on file  Tobacco Use  . Smoking status: Never Smoker  . Smokeless tobacco: Never Used  Substance and Sexual Activity  . Alcohol use: Yes    Alcohol/week: 3.0 standard drinks    Types: 3 Cans of beer per week  . Drug use: No  . Sexual activity: Yes  Lifestyle  . Physical activity:    Days per week: Not on file    Minutes per session: Not on file  . Stress: Not on file  Relationships  . Social connections:    Talks on phone: Not on file    Gets together: Not on file    Attends religious service: Not on file    Active member of club or organization: Not on file    Attends meetings of clubs or organizations: Not on file    Relationship status: Not on file  Other Topics Concern  . Not on file  Social History Narrative  . Not on file         Vanessa Kick,  MD 11/13/18 1256

## 2018-11-16 ENCOUNTER — Encounter (HOSPITAL_COMMUNITY)
Admission: RE | Admit: 2018-11-16 | Discharge: 2018-11-16 | Disposition: A | Discharge status: Home / Self Care | Payer: PPO | Source: Ambulatory Visit | Attending: Internal Medicine | Admitting: Internal Medicine

## 2018-11-16 ENCOUNTER — Encounter (HOSPITAL_COMMUNITY): Payer: Self-pay

## 2018-11-16 VITALS — BP 129/68 | HR 102 | Temp 97.7°F | Resp 16 | Ht 69.25 in | Wt 150.4 lb

## 2018-11-16 DIAGNOSIS — I2724 Chronic thromboembolic pulmonary hypertension: Secondary | ICD-10-CM | POA: Diagnosis not present

## 2018-11-16 NOTE — Progress Notes (Signed)
Danielle Pratt 68 y.o. female Pulmonary Rehab Orientation Note Danielle Pratt who is referred to pulmonary rehab by Dr. Haroldine Laws for Chronic Thromboembolic pulmonary hypertension  arrived today in [facility] for orientation to Pulmonary Rehab. She arrived ambulatory from the main entrance parking garage.  She does not carry portable oxygen. Per pt, she uses oxygen never. Color good, skin warm and dry. Patient is oriented to time and place. Patient's medical history, psychosocial health, and medications reviewed. Psychosocial assessment reveals pt lives with their spouse and her 68 year old father who is known by rehab staff due to previous participation of himself and his wife.  Her father is in good health and requires very little assistance. Pt is currently retired. Pt hobbies include doing crossword puzzles and sudeko. Pt reports her stress level is low everyday stress that have a beginning and end. Pt does not exhibit  signs of depression.  However pt has a history of depression particular after her diagnosis of cancer.  PHQ2/9 score 2/4. Pt shows good  coping skills with positive outlook.  Pt is looking forward to getting back into an exercise routine.  Will continue to monitor and evaluate progress toward psychosocial goal(s) of less blue days that she does not consider depressive. Physical assessment reveals heart rate is tachycardic, breath sounds clear to auscultation, no wheezes, rales, or rhonchi. Grip strength equal, strong. Distal pulses palpable with no swelling. Patient reports she does take medications as prescribed. Patient states she follows a Regular diet. The patient reports no specific efforts to gain or lose weight. However pt desires to lose some weight about 10 pounds. Patient's weight will be monitored closely. Demonstration and practice of PLB using pulse oximeter. Patient able to return demonstration satisfactorily. Safety and hand hygiene in the exercise area reviewed with  patient. Patient voices understanding of the information reviewed. Department expectations discussed with patient and achievable goals were set. The patient shows enthusiasm about attending the program and we look forward to working with this nice lady The patient is scheduled for a 6 min walk test on 11/17/18 at 12:15 and to begin exercise on 11/24/18 at 10:30. 45 minutes was spent on a variety of activities such as assessment of the patient, obtaining baseline data including height, weight, BMI, and grip strength, verifying medical history, allergies, and current medications, and teaching patient strategies for performing tasks with less respiratory effort with emphasis on pursed lip breathing.[place], Chiropodist

## 2018-11-17 ENCOUNTER — Ambulatory Visit (HOSPITAL_COMMUNITY): Payer: PPO

## 2018-11-17 ENCOUNTER — Telehealth (HOSPITAL_COMMUNITY): Payer: Self-pay | Admitting: Internal Medicine

## 2018-11-17 ENCOUNTER — Telehealth (HOSPITAL_COMMUNITY): Payer: Self-pay

## 2018-11-19 ENCOUNTER — Encounter (HOSPITAL_COMMUNITY)
Admission: RE | Admit: 2018-11-19 | Discharge: 2018-11-19 | Disposition: A | Discharge status: Home / Self Care | Payer: PPO | Source: Ambulatory Visit | Attending: Internal Medicine | Admitting: Internal Medicine

## 2018-11-19 DIAGNOSIS — I2724 Chronic thromboembolic pulmonary hypertension: Secondary | ICD-10-CM

## 2018-11-19 NOTE — Progress Notes (Signed)
Pulmonary Individual Treatment Plan  Patient Details   Name: Danielle Pratt MRN: [id number] Date of Birth: December 23, 1950 Referring Provider:     Pulmonary Rehab Walk Test from 11/19/2018 in [facility]  Referring Provider  Dr. Haroldine Laws      Initial Encounter Date:    Pulmonary Rehab Walk Test from 11/19/2018 in [facility]  Date  11/19/18      Visit Diagnosis: Chronic thromboembolic pulmonary hypertension ([place])  Patient's Home Medications on Admission:   Current Outpatient Medications:  .  apixaban (ELIQUIS) 5 MG TABS tablet, Take 1 tablet (5 mg total) by mouth 2 (two) times daily. -- Office visit needed for further refills, Disp: 60 tablet, Rfl: 11 .  clobetasol cream (TEMOVATE) 2.62 %, Apply 1 application topically at bedtime as needed (for skin irritation/finger). , Disp: , Rfl:  .  levothyroxine (SYNTHROID, LEVOTHROID) 50 MCG tablet, TAKE 1 AND A HALF TABLETS BY MOUTH DAILY BEFORE BREAKFAST, Disp: 126 tablet, Rfl: 0 .  pilocarpine (SALAGEN) 5 MG tablet, Take 5 mg by mouth See admin instructions. Take 1 tablet (5 mg) by mouth 3 times daily - early morning, midday and bedtime, may take an additional tablet during the day as needed for dry mouth., Disp: , Rfl:  .  tretinoin (RETIN-A) 0.05 % cream, Apply 1 application topically at bedtime., Disp: , Rfl:  .  venlafaxine XR (EFFEXOR-XR) 75 MG 24 hr capsule, TAKE ONE CAPSULE BY MOUTH EVERY NIGHT AT BEDTIME (Patient taking differently: Take 75 mg by mouth at bedtime. ), Disp: 90 capsule, Rfl: 0  Past Medical History: Past Medical History:  Diagnosis Date  . Anxiety   . Arthritis    "hands" (11/12/2017)  . Cataract    extraction surgery  . DVT (deep venous thrombosis) ([place]) 01/2016   LLE  . Fasting hyperglycemia    110 in 01/2011;131 in 05/2007  . Hypothyroidism   . Popliteal cyst   . Pulmonary embolism ([place]) 2017; 11/11/2017   "the one in 2017 was due to LLE DVT; don't why  I had the one 11/11/2017"  . Renal insufficiency   . Tonsillar cancer ([place]) 1998    Tobacco Use: Social History   Tobacco Use  Smoking Status Never Smoker  Smokeless Tobacco Never Used    Labs: Recent Review Flowsheet Data    Labs for ITP Cardiac and Pulmonary Rehab Latest Ref Rng & Units 03/02/2018 03/02/2018 03/02/2018 10/22/2018 10/22/2018   Cholestrol 0 - 200 mg/dL - - - - -   LDLCALC 0 - 99 mg/dL - - - - -   LDLDIRECT mg/dL - - - - -   HDL >39.00 mg/dL - - - - -   Trlycerides 0.0 - 149.0 mg/dL - - - - -   Hemoglobin A1c 4.8 - 5.6 % - - - - -   PHART 7.350 - 7.450 - - 7.335(L) - -   PCO2ART 32.0 - 48.0 mmHg - - 47.9 - -   HCO3 20.0 - 28.0 mmol/L 26.8 27.8 25.5 24.6 28.1(H)   TCO2 22 - 32 mmol/L 28 29 27 26 29    ACIDBASEDEF 0.0 - 2.0 mmol/L - - 1.0 - -   O2SAT % 63.0 61.0 93.0 71.0 67.0      Capillary Blood Glucose: No results found for: GLUCAP   Pulmonary Assessment Scores: Pulmonary Assessment Scores    Row Name 11/16/18 1602         ADL UCSD   ADL  Phase  Entry     SOB Score total  3       CAT Score   CAT Score  Pre - 12        Pulmonary Function Assessment:   Exercise Target Goals: Exercise Program Goal: Individual exercise prescription set using results from initial 6 min walk test and THRR while considering  patient's activity barriers and safety.   Exercise Prescription Goal: Initial exercise prescription builds to 30-45 minutes a day of aerobic activity, 2-3 days per week.  Home exercise guidelines will be given to patient during program as part of exercise prescription that the participant will acknowledge.  Activity Barriers & Risk Stratification: Activity Barriers & Cardiac Risk Stratification - 11/16/18 1422      Activity Barriers & Cardiac Risk Stratification   Activity Barriers  Left Hip Replacement;Right Hip Replacement;Shortness of Breath;Arthritis    Cardiac Risk Stratification  Moderate       6 Minute Walk: 6 Minute Walk    Row  Name 11/19/18 1309         6 Minute Walk   Phase  Initial     Distance  1675 feet     Walk Time  6 minutes     # of Rest Breaks  0     MPH  3.17     METS  3.45     RPE  10     Perceived Dyspnea   1     Symptoms  No     Resting HR  101 bpm     Resting BP  116/70     Resting Oxygen Saturation   98 %     Exercise Oxygen Saturation  during 6 min walk  86 %     Max Ex. HR  127 bpm     Max Ex. BP  150/80     2 Minute Post BP  116/76       Interval HR   1 Minute HR  104     2 Minute HR  112     3 Minute HR  117     4 Minute HR  120     5 Minute HR  123     6 Minute HR  127     2 Minute Post HR  116     Interval Heart Rate?  Yes       Interval Oxygen   Interval Oxygen?  Yes     Baseline Oxygen Saturation %  98 %     1 Minute Oxygen Saturation %  97 %     1 Minute Liters of Oxygen  0 L     2 Minute Oxygen Saturation %  92 %     2 Minute Liters of Oxygen  0 L     3 Minute Oxygen Saturation %  89 %     3 Minute Liters of Oxygen  0 L     4 Minute Oxygen Saturation %  88 %     4 Minute Liters of Oxygen  0 L     5 Minute Oxygen Saturation %  86 %     5 Minute Liters of Oxygen  0 L     6 Minute Oxygen Saturation %  86 %     6 Minute Liters of Oxygen  0 L     2 Minute Post Oxygen Saturation %  99 %     2 Minute Post Liters of Oxygen  0 L        Oxygen Initial Assessment: Oxygen Initial Assessment - 11/16/18 1425      Home Oxygen   Home Oxygen Device  None    Sleep Oxygen Prescription  None    Home Exercise Oxygen Prescription  None    Home at Rest Exercise Oxygen Prescription  None      Initial 6 min Walk   Oxygen Used  None      Program Oxygen Prescription   Program Oxygen Prescription  None      Intervention   Short Term Goals  To learn and exhibit compliance with exercise, home and travel O2 prescription;To learn and understand importance of maintaining oxygen saturations>88%;To learn and demonstrate proper use of respiratory medications;To learn and understand  importance of monitoring SPO2 with pulse oximeter and demonstrate accurate use of the pulse oximeter.;To learn and demonstrate proper pursed lip breathing techniques or other breathing techniques.    Long  Term Goals  Exhibits compliance with exercise, home and travel O2 prescription;Verbalizes importance of monitoring SPO2 with pulse oximeter and return demonstration;Maintenance of O2 saturations>88%;Exhibits proper breathing techniques, such as pursed lip breathing or other method taught during program session;Compliance with respiratory medication;Demonstrates proper use of MDI's       Oxygen Re-Evaluation: Oxygen Re-Evaluation    Row Name 11/19/18 1307             Program Oxygen Prescription   Program Oxygen Prescription  None O2 sat dropped to 86 during 76mwt. Will need to monitor closely on room air         Home Oxygen   Home Oxygen Device  None       Sleep Oxygen Prescription  None       Home Exercise Oxygen Prescription  None       Home at Rest Exercise Oxygen Prescription  None         Goals/Expected Outcomes   Short Term Goals  To learn and exhibit compliance with exercise, home and travel O2 prescription;To learn and understand importance of maintaining oxygen saturations>88%;To learn and demonstrate proper use of respiratory medications;To learn and understand importance of monitoring SPO2 with pulse oximeter and demonstrate accurate use of the pulse oximeter.;To learn and demonstrate proper pursed lip breathing techniques or other breathing techniques.       Long  Term Goals  Exhibits compliance with exercise, home and travel O2 prescription;Verbalizes importance of monitoring SPO2 with pulse oximeter and return demonstration;Maintenance of O2 saturations>88%;Exhibits proper breathing techniques, such as pursed lip breathing or other method taught during program session;Compliance with respiratory medication;Demonstrates proper use of MDI's       Goals/Expected Outcomes   compliance          Oxygen Discharge (Final Oxygen Re-Evaluation): Oxygen Re-Evaluation - 11/19/18 1307      Program Oxygen Prescription   Program Oxygen Prescription  None   O2 sat dropped to 86 during 58mwt. Will need to monitor closely on room air     Home Oxygen   Home Oxygen Device  None    Sleep Oxygen Prescription  None    Home Exercise Oxygen Prescription  None    Home at Rest Exercise Oxygen Prescription  None      Goals/Expected Outcomes   Short Term Goals  To learn and exhibit compliance with exercise, home and travel O2 prescription;To learn and understand importance of maintaining oxygen saturations>88%;To learn and demonstrate proper use of respiratory medications;To learn and understand importance of monitoring  SPO2 with pulse oximeter and demonstrate accurate use of the pulse oximeter.;To learn and demonstrate proper pursed lip breathing techniques or other breathing techniques.    Long  Term Goals  Exhibits compliance with exercise, home and travel O2 prescription;Verbalizes importance of monitoring SPO2 with pulse oximeter and return demonstration;Maintenance of O2 saturations>88%;Exhibits proper breathing techniques, such as pursed lip breathing or other method taught during program session;Compliance with respiratory medication;Demonstrates proper use of MDI's    Goals/Expected Outcomes  compliance       Initial Exercise Prescription: Initial Exercise Prescription - 11/19/18 1300      Date of Initial Exercise RX and Referring Provider   Date  11/19/18    Referring Provider  Dr. Haroldine Laws      Treadmill   MPH  2    Grade  2    Minutes  17      Bike   Level  0.5    Minutes  17      Recumbant Bike   Level  2    Minutes  17      Prescription Details   Frequency (times per week)  2    Duration  Progress to 45 minutes of aerobic exercise without signs/symptoms of physical distress      Intensity   THRR 40-80% of Max Heartrate  62-123    Ratings of  Perceived Exertion  11-13    Perceived Dyspnea  0-4      Progression   Progression  Continue to progress workloads to maintain intensity without signs/symptoms of physical distress.      Resistance Training   Training Prescription  Yes    Weight  orange bands    Reps  10-15       Perform Capillary Blood Glucose checks as needed.  Exercise Prescription Changes:   Exercise Comments:   Exercise Goals and Review: Exercise Goals    Row Name 11/16/18 1418             Exercise Goals   Increase Physical Activity  Yes       Intervention  Provide advice, education, support and counseling about physical activity/exercise needs.;Develop an individualized exercise prescription for aerobic and resistive training based on initial evaluation findings, risk stratification, comorbidities and participant's personal goals.       Expected Outcomes  Short Term: Attend rehab on a regular basis to increase amount of physical activity.;Long Term: Exercising regularly at least 3-5 days a week.;Long Term: Add in home exercise to make exercise part of routine and to increase amount of physical activity.       Increase Strength and Stamina  Yes       Intervention  Provide advice, education, support and counseling about physical activity/exercise needs.;Develop an individualized exercise prescription for aerobic and resistive training based on initial evaluation findings, risk stratification, comorbidities and participant's personal goals.       Expected Outcomes  Short Term: Increase workloads from initial exercise prescription for resistance, speed, and METs.;Short Term: Perform resistance training exercises routinely during rehab and add in resistance training at home;Long Term: Improve cardiorespiratory fitness, muscular endurance and strength as measured by increased METs and functional capacity (6MWT)       Able to understand and use rate of perceived exertion (RPE) scale  Yes       Intervention   Provide education and explanation on how to use RPE scale       Expected Outcomes  Short Term: Able to use RPE daily in  rehab to express subjective intensity level;Long Term:  Able to use RPE to guide intensity level when exercising independently       Able to understand and use Dyspnea scale  Yes       Intervention  Provide education and explanation on how to use Dyspnea scale       Expected Outcomes  Short Term: Able to use Dyspnea scale daily in rehab to express subjective sense of shortness of breath during exertion;Long Term: Able to use Dyspnea scale to guide intensity level when exercising independently       Knowledge and understanding of Target Heart Rate Range (THRR)  Yes       Intervention  Provide education and explanation of THRR including how the numbers were predicted and where they are located for reference       Expected Outcomes  Short Term: Able to state/look up THRR;Long Term: Able to use THRR to govern intensity when exercising independently;Short Term: Able to use daily as guideline for intensity in rehab       Understanding of Exercise Prescription  Yes       Intervention  Provide education, explanation, and written materials on patient's individual exercise prescription       Expected Outcomes  Short Term: Able to explain program exercise prescription;Long Term: Able to explain home exercise prescription to exercise independently          Exercise Goals Re-Evaluation :   Discharge Exercise Prescription (Final Exercise Prescription Changes):   Nutrition:  Target Goals: Understanding of nutrition guidelines, daily intake of sodium 1500mg , cholesterol 200mg , calories 30% from fat and 7% or less from saturated fats, daily to have 5 or more servings of fruits and vegetables.  Biometrics:    Nutrition Therapy Plan and Nutrition Goals:   Nutrition Assessments:   Nutrition Goals Re-Evaluation:   Nutrition Goals Discharge (Final Nutrition Goals  Re-Evaluation):   Psychosocial: Target Goals: Acknowledge presence or absence of significant depression and/or stress, maximize coping skills, provide positive support system. Participant is able to verbalize types and ability to use techniques and skills needed for reducing stress and depression.  Initial Review & Psychosocial Screening: Initial Psych Review & Screening - 11/16/18 1443      Initial Review   Current issues with  History of Depression      Family Dynamics   Good Support System?  Yes    Comments  husband and father who is 63 and in good health      Barriers   Psychosocial barriers to participate in program  There are no identifiable barriers or psychosocial needs.;The patient should benefit from training in stress management and relaxation.      Screening Interventions   Interventions  Encouraged to exercise    Expected Outcomes  Long Term goal: The participant improves quality of Life and PHQ9 Scores as seen by post scores and/or verbalization of changes;Short Term goal: Identification and review with participant of any Quality of Life or Depression concerns found by scoring the questionnaire.       Quality of Life Scores:  Scores of 19 and below usually indicate a poorer quality of life in these areas.  A difference of  2-3 points is a clinically meaningful difference.  A difference of 2-3 points in the total score of the Quality of Life Index has been associated with significant improvement in overall quality of life, self-image, physical symptoms, and general health in studies assessing change in quality of life.   PHQ-9:  Recent Review Flowsheet Data    Depression screen [facility] 2/9 11/16/2018 11/20/2017 06/09/2013   Decreased Interest 1 0 0   Down, Depressed, Hopeless 1 0 0   PHQ - 2 Score 2 0 0   Altered sleeping 0  - -   Tired, decreased energy 1 - -   Change in appetite 0 - -   Feeling bad or failure about yourself  1  - -   Trouble concentrating 0 - -   Moving  slowly or fidgety/restless 0 - -   Suicidal thoughts 0 - -   PHQ-9 Score 4 - -   Difficult doing work/chores Not difficult at all - -     Interpretation of Total Score  Total Score Depression Severity:  1-4 = Minimal depression, 5-9 = Mild depression, 10-14 = Moderate depression, 15-19 = Moderately severe depression, 20-27 = Severe depression   Psychosocial Evaluation and Intervention: Psychosocial Evaluation - 11/16/18 1535      Psychosocial Evaluation & Interventions   Interventions  Stress management education;Relaxation education;Encouraged to exercise with the program and follow exercise prescription    Comments  Pt admits to having some blue days and feels the Effexor works well with her. Pt is eager to "get in shape".    Expected Outcomes  Pt will report having decrease in frequency of blue days.    Continue Psychosocial Services   Follow up required by staff       Psychosocial Re-Evaluation:   Psychosocial Discharge (Final Psychosocial Re-Evaluation):    Education: Education Goals: Education classes will be provided on a weekly basis, covering required topics. Participant will state understanding/return demonstration of topics presented.  Learning Barriers/Preferences: Learning Barriers/Preferences - 11/16/18 1445      Learning Barriers/Preferences   Learning Barriers  Sight    Learning Preferences  Group Instruction;Skilled Demonstration;Individual Instruction;Verbal Instruction       Education Topics: How Lungs Work and Diseases: - Discuss the anatomy of the lungs and diseases that can affect the lungs, such as COPD.   Exercise: -Discuss the importance of exercise, FITT principles of exercise, normal and abnormal responses to exercise, and how to exercise safely.   Environmental Irritants: -Discuss types of environmental irritants and how to limit exposure to environmental irritants.   Meds/Inhalers and oxygen: - Discuss respiratory medications,  definition of an inhaler and oxygen, and the proper way to use an inhaler and oxygen.   Energy Saving Techniques: - Discuss methods to conserve energy and decrease shortness of breath when performing activities of daily living.    Bronchial Hygiene / Breathing Techniques: - Discuss breathing mechanics, pursed-lip breathing technique,  proper posture, effective ways to clear airways, and other functional breathing techniques   Cleaning Equipment: - Provides group verbal and written instruction about the health risks of elevated stress, cause of high stress, and healthy ways to reduce stress.   Nutrition I: Fats: - Discuss the types of cholesterol, what cholesterol does to the body, and how cholesterol levels can be controlled.   Nutrition II: Labels: -Discuss the different components of food labels and how to read food labels.   Respiratory Infections: - Discuss the signs and symptoms of respiratory infections, ways to prevent respiratory infections, and the importance of seeking medical treatment when having a respiratory infection.   Stress I: Signs and Symptoms: - Discuss the causes of stress, how stress may lead to anxiety and depression, and ways to limit stress.   Stress II: Relaxation: -Discuss relaxation techniques to limit  stress.   Oxygen for Home/Travel: - Discuss how to prepare for travel when on oxygen and proper ways to transport and store oxygen to ensure safety.   Knowledge Questionnaire Score: Knowledge Questionnaire Score - 11/16/18 1447      Knowledge Questionnaire Score   Pre Score  17/18       Core Components/Risk Factors/Patient Goals at Admission: Personal Goals and Risk Factors at Admission - 11/16/18 1448      Core Components/Risk Factors/Patient Goals on Admission    Weight Management  Yes;Weight Loss    Intervention  Weight Management/Obesity: Establish reasonable short term and long term weight goals.;Weight Management: Provide education  and appropriate resources to help participant work on and attain dietary goals.;Weight Management: Develop a combined nutrition and exercise program designed to reach desired caloric intake, while maintaining appropriate intake of nutrient and fiber, sodium and fats, and appropriate energy expenditure required for the weight goal.    Admit Weight  150 lb 5.7 oz (68.2 kg)    Goal Weight: Short Term  145 lb (65.8 kg)    Goal Weight: Long Term  140 lb (63.5 kg)    Expected Outcomes  Short Term: Continue to assess and modify interventions until short term weight is achieved;Long Term: Adherence to nutrition and physical activity/exercise program aimed toward attainment of established weight goal;Weight Loss: Understanding of general recommendations for a balanced deficit meal plan, which promotes 1-2 lb weight loss per week and includes a negative energy balance of [phone] kcal/d;Understanding recommendations for meals to include 15-35% energy as protein, 25-35% energy from fat, 35-60% energy from carbohydrates, less than 200mg  of dietary cholesterol, 20-35 gm of total fiber daily;Understanding of distribution of calorie intake throughout the day with the consumption of 4-5 meals/snacks    Improve shortness of breath with ADL's  Yes    Intervention  Provide education, individualized exercise plan and daily activity instruction to help decrease symptoms of SOB with activities of daily living.    Expected Outcomes  Short Term: Improve cardiorespiratory fitness to achieve a reduction of symptoms when performing ADLs;Long Term: Be able to perform more ADLs without symptoms or delay the onset of symptoms       Core Components/Risk Factors/Patient Goals Review:    Core Components/Risk Factors/Patient Goals at Discharge (Final Review):    ITP Comments:   Comments:

## 2018-11-24 ENCOUNTER — Ambulatory Visit (HOSPITAL_COMMUNITY): Payer: PPO

## 2018-11-26 ENCOUNTER — Ambulatory Visit (HOSPITAL_COMMUNITY): Payer: PPO

## 2018-11-28 ENCOUNTER — Other Ambulatory Visit (HOSPITAL_COMMUNITY): Payer: Self-pay | Admitting: Internal Medicine

## 2018-12-01 ENCOUNTER — Ambulatory Visit (HOSPITAL_COMMUNITY): Payer: PPO

## 2018-12-02 ENCOUNTER — Telehealth (HOSPITAL_COMMUNITY): Payer: Self-pay | Admitting: Internal Medicine

## 2018-12-02 ENCOUNTER — Other Ambulatory Visit (HOSPITAL_COMMUNITY): Payer: Self-pay | Admitting: Internal Medicine

## 2018-12-03 ENCOUNTER — Ambulatory Visit (HOSPITAL_COMMUNITY): Payer: PPO

## 2018-12-03 ENCOUNTER — Encounter (HOSPITAL_COMMUNITY): Payer: PPO

## 2018-12-03 NOTE — Progress Notes (Signed)
Danielle Pratt 68 y.o. female  DOB: [birth year] MRN: [id number]           Nutrition Note 1. Chronic thromboembolic pulmonary hypertension ([facility])    Past Medical History:  Diagnosis Date  . Anxiety   . Arthritis    "hands" (11/12/2017)  . Cataract    extraction surgery  . DVT (deep venous thrombosis) ([place]) 01/2016   LLE  . Fasting hyperglycemia    110 in 01/2011;131 in 05/2007  . Hypothyroidism   . Popliteal cyst   . Pulmonary embolism ([place]) 2017; 11/11/2017   "the one in 2017 was due to LLE DVT; don't why I had the one 11/11/2017"  . Renal insufficiency   . Tonsillar cancer ([place]) 1998   Meds reviewed.   Current Outpatient Medications (Endocrine & Metabolic):  .  levothyroxine (SYNTHROID, LEVOTHROID) 50 MCG tablet, TAKE 1 AND A HALF TABLETS BY MOUTH DAILY BEFORE BREAKFAST     Current Outpatient Medications (Hematological):  .  apixaban (ELIQUIS) 5 MG TABS tablet, Take 1 tablet (5 mg total) by mouth 2 (two) times daily. -- Office visit needed for further refills  Current Outpatient Medications (Other):  .  clobetasol cream (TEMOVATE) 1.74 %, Apply 1 application topically at bedtime as needed (for skin irritation/finger).  .  pilocarpine (SALAGEN) 5 MG tablet, Take 5 mg by mouth See admin instructions. Take 1 tablet (5 mg) by mouth 3 times daily - early morning, midday and bedtime, may take an additional tablet during the day as needed for dry mouth. .  tretinoin (RETIN-A) 0.05 % cream, Apply 1 application topically at bedtime. [name]  venlafaxine XR (EFFEXOR-XR) 75 MG 24 hr capsule, Take 1 capsule every night at bedtime. Needs office visit for more refills.   Ht: Ht Readings from Last 1 Encounters:  11/16/18 5' 9.25" (1.759 m)     Wt:  Wt Readings from Last 6 Encounters:  11/16/18 150 lb 5.7 oz (68.2 kg)  11/03/18 149 lb 9.6 oz (67.9 kg)  10/22/18 150 lb (68 kg)  08/20/18 145 lb 9.6 oz (66 kg)  05/15/18 147 lb (66.7 kg)  04/30/18 154 lb (69.9 kg)     BMI: Body mass index  is 22.04 kg/m.    Current tobacco use? No    Labs:  Lipid Panel     Component Value Date/Time   CHOL 215 (H) 07/28/2015 1134   TRIG 85.0 07/28/2015 1134   HDL 82.90 07/28/2015 1134   CHOLHDL 3 07/28/2015 1134   VLDL 17.0 07/28/2015 1134   LDLCALC 115 (H) 07/28/2015 1134   LDLDIRECT 161.1 06/09/2013 1603    Lab Results  Component Value Date   HGBA1C 5.6 02/01/2016    Nutrition Diagnosis ? Food-and nutrition-related knowledge deficit related to lack of exposure to information as related to diagnosis of pulmonary disease  Goal(s) 1. To be determined  Plan:  Pt to attend Pulmonary Nutrition class Will provide client-centered nutrition education as part of interdisciplinary care.    Monitor and Evaluate progress toward nutrition goal with team.   Laurina Bustle, MS, RD, LDN 12/03/2018 1:59 PM

## 2018-12-08 ENCOUNTER — Ambulatory Visit (HOSPITAL_COMMUNITY): Payer: PPO

## 2018-12-08 ENCOUNTER — Encounter (HOSPITAL_COMMUNITY): Payer: PPO

## 2018-12-10 ENCOUNTER — Encounter (HOSPITAL_COMMUNITY): Payer: PPO

## 2018-12-10 ENCOUNTER — Ambulatory Visit (HOSPITAL_COMMUNITY): Payer: PPO

## 2018-12-15 ENCOUNTER — Telehealth (HOSPITAL_COMMUNITY): Payer: Self-pay | Admitting: Internal Medicine

## 2018-12-15 ENCOUNTER — Encounter (HOSPITAL_COMMUNITY): Payer: Self-pay

## 2018-12-15 ENCOUNTER — Encounter (HOSPITAL_COMMUNITY): Payer: PPO

## 2018-12-15 ENCOUNTER — Ambulatory Visit (HOSPITAL_COMMUNITY): Payer: PPO

## 2018-12-15 NOTE — Addendum Note (Signed)
Encounter addended by: Philis Kendall on: 12/15/2018 8:19 AM  Actions taken: Flowsheet data copied forward, Visit Navigator Flowsheet section accepted

## 2018-12-15 NOTE — Progress Notes (Signed)
Pulmonary Individual Treatment Plan  Patient Details  Name: Danielle Pratt MRN: [id number] Date of Birth: [birth year] Referring Provider:     Pulmonary Rehab Walk Test from 11/19/2018 in [facility]  Referring Provider  Dr. Haroldine Laws      Initial Encounter Date:    Pulmonary Rehab Walk Test from 11/19/2018 in [facility]  Date  11/19/18      Visit Diagnosis: Chronic thromboembolic pulmonary hypertension ([place])  Patient's Home Medications on Admission:   Current Outpatient Medications:  .  apixaban (ELIQUIS) 5 MG TABS tablet, Take 1 tablet (5 mg total) by mouth 2 (two) times daily. -- Office visit needed for further refills, Disp: 60 tablet, Rfl: 11 .  clobetasol cream (TEMOVATE) 7.51 %, Apply 1 application topically at bedtime as needed (for skin irritation/finger). , Disp: , Rfl:  .  levothyroxine (SYNTHROID, LEVOTHROID) 50 MCG tablet, TAKE 1 AND A HALF TABLETS BY MOUTH DAILY BEFORE BREAKFAST, Disp: 126 tablet, Rfl: 0 .  pilocarpine (SALAGEN) 5 MG tablet, Take 5 mg by mouth See admin instructions. Take 1 tablet (5 mg) by mouth 3 times daily - early morning, midday and bedtime, may take an additional tablet during the day as needed for dry mouth., Disp: , Rfl:  .  tretinoin (RETIN-A) 0.05 % cream, Apply 1 application topically at bedtime., Disp: , Rfl:  .  venlafaxine XR (EFFEXOR-XR) 75 MG 24 hr capsule, Take 1 capsule every night at bedtime. Needs office visit for more refills., Disp: 30 capsule, Rfl: 0  Past Medical History: Past Medical History:  Diagnosis Date  . Anxiety   . Arthritis    "hands" (11/12/2017)  . Cataract    extraction surgery  . DVT (deep venous thrombosis) ([place]) 01/2016   LLE  . Fasting hyperglycemia    110 in 01/2011;131 in 05/2007  . Hypothyroidism   . Popliteal cyst   . Pulmonary embolism ([place]) 2017; 11/11/2017   "the one in 2017 was due to LLE DVT; don't why I had the one 11/11/2017"  . Renal  insufficiency   . Tonsillar cancer ([place]) 1998    Tobacco Use: Social History   Tobacco Use  Smoking Status Never Smoker  Smokeless Tobacco Never Used    Labs: Recent Review Flowsheet Data    Labs for ITP Cardiac and Pulmonary Rehab Latest Ref Rng & Units 03/02/2018 03/02/2018 03/02/2018 10/22/2018 10/22/2018   Cholestrol 0 - 200 mg/dL - - - - -   LDLCALC 0 - 99 mg/dL - - - - -   LDLDIRECT mg/dL - - - - -   HDL >39.00 mg/dL - - - - -   Trlycerides 0.0 - 149.0 mg/dL - - - - -   Hemoglobin A1c 4.8 - 5.6 % - - - - -   PHART 7.350 - 7.450 - - 7.335(L) - -   PCO2ART 32.0 - 48.0 mmHg - - 47.9 - -   HCO3 20.0 - 28.0 mmol/L 26.8 27.8 25.5 24.6 28.1(H)   TCO2 22 - 32 mmol/L 28 29 27 26 29    ACIDBASEDEF 0.0 - 2.0 mmol/L - - 1.0 - -   O2SAT % 63.0 61.0 93.0 71.0 67.0      Capillary Blood Glucose: No results found for: GLUCAP   Pulmonary Assessment Scores: Pulmonary Assessment Scores    Row Name 11/16/18 1602         ADL UCSD   ADL Phase  Entry     SOB  Score total  3       CAT Score   CAT Score  Pre - 12        Pulmonary Function Assessment:   Exercise Target Goals: Exercise Program Goal: Individual exercise prescription set using results from initial 6 min walk test and THRR while considering  patient's activity barriers and safety.   Exercise Prescription Goal: Initial exercise prescription builds to 30-45 minutes a day of aerobic activity, 2-3 days per week.  Home exercise guidelines will be given to patient during program as part of exercise prescription that the participant will acknowledge.  Activity Barriers & Risk Stratification: Activity Barriers & Cardiac Risk Stratification - 11/16/18 1422      Activity Barriers & Cardiac Risk Stratification   Activity Barriers  Left Hip Replacement;Right Hip Replacement;Shortness of Breath;Arthritis    Cardiac Risk Stratification  Moderate       6 Minute Walk: 6 Minute Walk    Row Name 11/19/18 1309         6  Minute Walk   Phase  Initial     Distance  1675 feet     Walk Time  6 minutes     # of Rest Breaks  0     MPH  3.17     METS  3.45     RPE  10     Perceived Dyspnea   1     Symptoms  No     Resting HR  101 bpm     Resting BP  116/70     Resting Oxygen Saturation   98 %     Exercise Oxygen Saturation  during 6 min walk  86 %     Max Ex. HR  127 bpm     Max Ex. BP  150/80     2 Minute Post BP  116/76       Interval HR   1 Minute HR  104     2 Minute HR  112     3 Minute HR  117     4 Minute HR  120     5 Minute HR  123     6 Minute HR  127     2 Minute Post HR  116     Interval Heart Rate?  Yes       Interval Oxygen   Interval Oxygen?  Yes     Baseline Oxygen Saturation %  98 %     1 Minute Oxygen Saturation %  97 %     1 Minute Liters of Oxygen  0 L     2 Minute Oxygen Saturation %  92 %     2 Minute Liters of Oxygen  0 L     3 Minute Oxygen Saturation %  89 %     3 Minute Liters of Oxygen  0 L     4 Minute Oxygen Saturation %  88 %     4 Minute Liters of Oxygen  0 L     5 Minute Oxygen Saturation %  86 %     5 Minute Liters of Oxygen  0 L     6 Minute Oxygen Saturation %  86 %     6 Minute Liters of Oxygen  0 L     2 Minute Post Oxygen Saturation %  99 %     2 Minute Post Liters of Oxygen  0 L  Oxygen Initial Assessment: Oxygen Initial Assessment - 11/16/18 1425      Home Oxygen   Home Oxygen Device  None    Sleep Oxygen Prescription  None    Home Exercise Oxygen Prescription  None    Home at Rest Exercise Oxygen Prescription  None      Initial 6 min Walk   Oxygen Used  None      Program Oxygen Prescription   Program Oxygen Prescription  None      Intervention   Short Term Goals  To learn and exhibit compliance with exercise, home and travel O2 prescription;To learn and understand importance of maintaining oxygen saturations>88%;To learn and demonstrate proper use of respiratory medications;To learn and understand importance of monitoring SPO2  with pulse oximeter and demonstrate accurate use of the pulse oximeter.;To learn and demonstrate proper pursed lip breathing techniques or other breathing techniques.    Long  Term Goals  Exhibits compliance with exercise, home and travel O2 prescription;Verbalizes importance of monitoring SPO2 with pulse oximeter and return demonstration;Maintenance of O2 saturations>88%;Exhibits proper breathing techniques, such as pursed lip breathing or other method taught during program session;Compliance with respiratory medication;Demonstrates proper use of MDI's       Oxygen Re-Evaluation: Oxygen Re-Evaluation    Row Name 11/19/18 1307 12/15/18 0812           Program Oxygen Prescription   Program Oxygen Prescription  None O2 sat dropped to 86 during 17mwt. Will need to monitor closely on room air  None        Home Oxygen   Home Oxygen Device  None  None      Sleep Oxygen Prescription  None  None      Home Exercise Oxygen Prescription  None  None      Home at Rest Exercise Oxygen Prescription  None  None        Goals/Expected Outcomes   Short Term Goals  To learn and exhibit compliance with exercise, home and travel O2 prescription;To learn and understand importance of maintaining oxygen saturations>88%;To learn and demonstrate proper use of respiratory medications;To learn and understand importance of monitoring SPO2 with pulse oximeter and demonstrate accurate use of the pulse oximeter.;To learn and demonstrate proper pursed lip breathing techniques or other breathing techniques.  To learn and exhibit compliance with exercise, home and travel O2 prescription;To learn and understand importance of maintaining oxygen saturations>88%;To learn and demonstrate proper use of respiratory medications;To learn and understand importance of monitoring SPO2 with pulse oximeter and demonstrate accurate use of the pulse oximeter.;To learn and demonstrate proper pursed lip breathing techniques or other breathing  techniques.      Long  Term Goals  Exhibits compliance with exercise, home and travel O2 prescription;Verbalizes importance of monitoring SPO2 with pulse oximeter and return demonstration;Maintenance of O2 saturations>88%;Exhibits proper breathing techniques, such as pursed lip breathing or other method taught during program session;Compliance with respiratory medication;Demonstrates proper use of MDI's  Exhibits compliance with exercise, home and travel O2 prescription;Verbalizes importance of monitoring SPO2 with pulse oximeter and return demonstration;Maintenance of O2 saturations>88%;Exhibits proper breathing techniques, such as pursed lip breathing or other method taught during program session;Compliance with respiratory medication;Demonstrates proper use of MDI's      Goals/Expected Outcomes  compliance  compliance         Oxygen Discharge (Final Oxygen Re-Evaluation): Oxygen Re-Evaluation - 12/15/18 0812      Program Oxygen Prescription   Program Oxygen Prescription  None      Home  Oxygen   Home Oxygen Device  None    Sleep Oxygen Prescription  None    Home Exercise Oxygen Prescription  None    Home at Rest Exercise Oxygen Prescription  None      Goals/Expected Outcomes   Short Term Goals  To learn and exhibit compliance with exercise, home and travel O2 prescription;To learn and understand importance of maintaining oxygen saturations>88%;To learn and demonstrate proper use of respiratory medications;To learn and understand importance of monitoring SPO2 with pulse oximeter and demonstrate accurate use of the pulse oximeter.;To learn and demonstrate proper pursed lip breathing techniques or other breathing techniques.    Long  Term Goals  Exhibits compliance with exercise, home and travel O2 prescription;Verbalizes importance of monitoring SPO2 with pulse oximeter and return demonstration;Maintenance of O2 saturations>88%;Exhibits proper breathing techniques, such as pursed lip  breathing or other method taught during program session;Compliance with respiratory medication;Demonstrates proper use of MDI's    Goals/Expected Outcomes  compliance       Initial Exercise Prescription: Initial Exercise Prescription - 11/19/18 1300      Date of Initial Exercise RX and Referring Provider   Date  11/19/18    Referring Provider  Dr. Haroldine Laws      Treadmill   MPH  2    Grade  2    Minutes  17      Bike   Level  0.5    Minutes  17      Recumbant Bike   Level  2    Minutes  17      Prescription Details   Frequency (times per week)  2    Duration  Progress to 45 minutes of aerobic exercise without signs/symptoms of physical distress      Intensity   THRR 40-80% of Max Heartrate  62-123    Ratings of Perceived Exertion  11-13    Perceived Dyspnea  0-4      Progression   Progression  Continue to progress workloads to maintain intensity without signs/symptoms of physical distress.      Resistance Training   Training Prescription  Yes    Weight  orange bands    Reps  10-15       Perform Capillary Blood Glucose checks as needed.  Exercise Prescription Changes:   Exercise Comments:   Exercise Goals and Review:  Exercise Goals    Row Name 11/16/18 1418             Exercise Goals   Increase Physical Activity  Yes       Intervention  Provide advice, education, support and counseling about physical activity/exercise needs.;Develop an individualized exercise prescription for aerobic and resistive training based on initial evaluation findings, risk stratification, comorbidities and participant's personal goals.       Expected Outcomes  Short Term: Attend rehab on a regular basis to increase amount of physical activity.;Long Term: Exercising regularly at least 3-5 days a week.;Long Term: Add in home exercise to make exercise part of routine and to increase amount of physical activity.       Increase Strength and Stamina  Yes       Intervention  Provide  advice, education, support and counseling about physical activity/exercise needs.;Develop an individualized exercise prescription for aerobic and resistive training based on initial evaluation findings, risk stratification, comorbidities and participant's personal goals.       Expected Outcomes  Short Term: Increase workloads from initial exercise prescription for resistance, speed, and METs.;Short Term: Perform  resistance training exercises routinely during rehab and add in resistance training at home;Long Term: Improve cardiorespiratory fitness, muscular endurance and strength as measured by increased METs and functional capacity (6MWT)       Able to understand and use rate of perceived exertion (RPE) scale  Yes       Intervention  Provide education and explanation on how to use RPE scale       Expected Outcomes  Short Term: Able to use RPE daily in rehab to express subjective intensity level;Long Term:  Able to use RPE to guide intensity level when exercising independently       Able to understand and use Dyspnea scale  Yes       Intervention  Provide education and explanation on how to use Dyspnea scale       Expected Outcomes  Short Term: Able to use Dyspnea scale daily in rehab to express subjective sense of shortness of breath during exertion;Long Term: Able to use Dyspnea scale to guide intensity level when exercising independently       Knowledge and understanding of Target Heart Rate Range (THRR)  Yes       Intervention  Provide education and explanation of THRR including how the numbers were predicted and where they are located for reference       Expected Outcomes  Short Term: Able to state/look up THRR;Long Term: Able to use THRR to govern intensity when exercising independently;Short Term: Able to use daily as guideline for intensity in rehab       Understanding of Exercise Prescription  Yes       Intervention  Provide education, explanation, and written materials on patient's individual  exercise prescription       Expected Outcomes  Short Term: Able to explain program exercise prescription;Long Term: Able to explain home exercise prescription to exercise independently          Exercise Goals Re-Evaluation : Exercise Goals Re-Evaluation    Row Name 12/15/18 0818             Exercise Goal Re-Evaluation   Exercise Goals Review  Increase Physical Activity;Increase Strength and Stamina;Able to understand and use rate of perceived exertion (RPE) scale;Able to understand and use Dyspnea scale;Knowledge and understanding of Target Heart Rate Range (THRR);Understanding of Exercise Prescription       Comments  Pt completed her walk test almost a month ago and has yet to start exercise. Called out for the third week in a row. If pt does not come for exercise next week she will be dropped.        Expected Outcomes  Through exercise at rehab and at home, the patient will decrease shortness of breath with daily activities and feel confident in carrying out an exercise regime at home.           Discharge Exercise Prescription (Final Exercise Prescription Changes):   Nutrition:  Target Goals: Understanding of nutrition guidelines, daily intake of sodium 1500mg , cholesterol 200mg , calories 30% from fat and 7% or less from saturated fats, daily to have 5 or more servings of fruits and vegetables.  Biometrics:    Nutrition Therapy Plan and Nutrition Goals: Nutrition Therapy & Goals - 12/03/18 1412      Nutrition Therapy   Diet  heart healthy, carb modified      Personal Nutrition Goals   Nutrition Goal  to be determined      Intervention Plan   Intervention  Prescribe, educate and counsel regarding  individualized specific dietary modifications aiming towards targeted core components such as weight, hypertension, lipid management, diabetes, heart failure and other comorbidities.    Expected Outcomes  Short Term Goal: Understand basic principles of dietary content, such as  calories, fat, sodium, cholesterol and nutrients.;Long Term Goal: Adherence to prescribed nutrition plan.       Nutrition Assessments: Nutrition Assessments - 12/03/18 1417      Rate Your Plate Scores   Pre Score  38       Nutrition Goals Re-Evaluation: Nutrition Goals Re-Evaluation    Row Name 12/03/18 1416             Goals   Current Weight  150 lb 5.7 oz (68.2 kg)          Nutrition Goals Discharge (Final Nutrition Goals Re-Evaluation): Nutrition Goals Re-Evaluation - 12/03/18 1416      Goals   Current Weight  150 lb 5.7 oz (68.2 kg)       Psychosocial: Target Goals: Acknowledge presence or absence of significant depression and/or stress, maximize coping skills, provide positive support system. Participant is able to verbalize types and ability to use techniques and skills needed for reducing stress and depression.  Initial Review & Psychosocial Screening: Initial Psych Review & Screening - 11/16/18 1443      Initial Review   Current issues with  History of Depression      Family Dynamics   Good Support System?  Yes    Comments  husband and father who is 3 and in good health      Barriers   Psychosocial barriers to participate in program  There are no identifiable barriers or psychosocial needs.;The patient should benefit from training in stress management and relaxation.      Screening Interventions   Interventions  Encouraged to exercise    Expected Outcomes  Long Term goal: The participant improves quality of Life and PHQ9 Scores as seen by post scores and/or verbalization of changes;Short Term goal: Identification and review with participant of any Quality of Life or Depression concerns found by scoring the questionnaire.       Quality of Life Scores:  Scores of 19 and below usually indicate a poorer quality of life in these areas.  A difference of  2-3 points is a clinically meaningful difference.  A difference of 2-3 points in the total score of the  Quality of Life Index has been associated with significant improvement in overall quality of life, self-image, physical symptoms, and general health in studies assessing change in quality of life.  PHQ-9: Recent Review Flowsheet Data    Depression screen [facility] 2/9 11/16/2018 11/20/2017 06/09/2013   Decreased Interest 1 0 0   Down, Depressed, Hopeless 1 0 0   PHQ - 2 Score 2 0 0   Altered sleeping 0  - -   Tired, decreased energy 1 - -   Change in appetite 0 - -   Feeling bad or failure about yourself  1  - -   Trouble concentrating 0 - -   Moving slowly or fidgety/restless 0 - -   Suicidal thoughts 0 - -   PHQ-9 Score 4 - -   Difficult doing work/chores Not difficult at all - -     Interpretation of Total Score  Total Score Depression Severity:  1-4 = Minimal depression, 5-9 = Mild depression, 10-14 = Moderate depression, 15-19 = Moderately severe depression, 20-27 = Severe depression   Psychosocial Evaluation and Intervention: Psychosocial Evaluation -  12/15/18 1633      Psychosocial Evaluation & Interventions   Interventions  Stress management education;Relaxation education;Encouraged to exercise with the program and follow exercise prescription    Comments  Pt admits to having some blue days and feels the Effexor works well with her. Pt is eager to "get in shape".    Expected Outcomes  Pt will report having decrease in frequency of blue days.    Continue Psychosocial Services   Follow up required by staff       Psychosocial Re-Evaluation: Psychosocial Re-Evaluation    [place] Name 12/15/18 1633             Psychosocial Re-Evaluation   Current issues with  History of Depression       Comments  Pt admits she does have "blue" days but she takes medication which helps.       Expected Outcomes  Pt will report a decrease in blue days       Continue Psychosocial Services   Follow up required by staff          Psychosocial Discharge (Final Psychosocial Re-Evaluation): Psychosocial  Re-Evaluation - 12/15/18 1633      Psychosocial Re-Evaluation   Current issues with  History of Depression    Comments  Pt admits she does have "blue" days but she takes medication which helps.    Expected Outcomes  Pt will report a decrease in blue days    Continue Psychosocial Services   Follow up required by staff       Education: Education Goals: Education classes will be provided on a weekly basis, covering required topics. Participant will state understanding/return demonstration of topics presented.  Learning Barriers/Preferences: Learning Barriers/Preferences - 11/16/18 1445      Learning Barriers/Preferences   Learning Barriers  Sight    Learning Preferences  Group Instruction;Skilled Demonstration;Individual Instruction;Verbal Instruction       Education Topics: Risk Factor Reduction:  -Group instruction that is supported by a PowerPoint presentation. Instructor discusses the definition of a risk factor, different risk factors for pulmonary disease, and how the heart and lungs work together.     Nutrition for Pulmonary Patient:  -Group instruction provided by PowerPoint slides, verbal discussion, and written materials to support subject matter. The instructor gives an explanation and review of healthy diet recommendations, which includes a discussion on weight management, recommendations for fruit and vegetable consumption, as well as protein, fluid, caffeine, fiber, sodium, sugar, and alcohol. Tips for eating when patients are short of breath are discussed.   Pursed Lip Breathing:  -Group instruction that is supported by demonstration and informational handouts. Instructor discusses the benefits of pursed lip and diaphragmatic breathing and detailed demonstration on how to preform both.     Oxygen Safety:  -Group instruction provided by PowerPoint, verbal discussion, and written material to support subject matter. There is an overview of "What is Oxygen" and "Why do we  need it".  Instructor also reviews how to create a safe environment for oxygen use, the importance of using oxygen as prescribed, and the risks of noncompliance. There is a brief discussion on traveling with oxygen and resources the patient may utilize.   Oxygen Equipment:  -Group instruction provided by [facility] Staff utilizing handouts, written materials, and equipment demonstrations.   Signs and Symptoms:  -Group instruction provided by written material and verbal discussion to support subject matter. Warning signs and symptoms of infection, stroke, and heart attack are reviewed and when to call the physician/911 reinforced.  Tips for preventing the spread of infection discussed.   Advanced Directives:  -Group instruction provided by verbal instruction and written material to support subject matter. Instructor reviews Advanced Directive laws and proper instruction for filling out document.   Pulmonary Video:  -Group video education that reviews the importance of medication and oxygen compliance, exercise, good nutrition, pulmonary hygiene, and pursed lip and diaphragmatic breathing for the pulmonary patient.   Exercise for the Pulmonary Patient:  -Group instruction that is supported by a PowerPoint presentation. Instructor discusses benefits of exercise, core components of exercise, frequency, duration, and intensity of an exercise routine, importance of utilizing pulse oximetry during exercise, safety while exercising, and options of places to exercise outside of rehab.     Pulmonary Medications:  -Verbally interactive group education provided by instructor with focus on inhaled medications and proper administration.   Anatomy and Physiology of the Respiratory System and Intimacy:  -Group instruction provided by PowerPoint, verbal discussion, and written material to support subject matter. Instructor reviews respiratory cycle and anatomical components of the respiratory system and  their functions. Instructor also reviews differences in obstructive and restrictive respiratory diseases with examples of each. Intimacy, Sex, and Sexuality differences are reviewed with a discussion on how relationships can change when diagnosed with pulmonary disease. Common sexual concerns are reviewed.   MD DAY -A group question and answer session with a medical doctor that allows participants to ask questions that relate to their pulmonary disease state.   OTHER EDUCATION -Group or individual verbal, written, or video instructions that support the educational goals of the pulmonary rehab program.   Holiday Eating Survival Tips:  -Group instruction provided by PowerPoint slides, verbal discussion, and written materials to support subject matter. The instructor gives patients tips, tricks, and techniques to help them not only survive but enjoy the holidays despite the onslaught of food that accompanies the holidays.   Knowledge Questionnaire Score: Knowledge Questionnaire Score - 11/16/18 1447      Knowledge Questionnaire Score   Pre Score  17/18       Core Components/Risk Factors/Patient Goals at Admission: Personal Goals and Risk Factors at Admission - 11/16/18 1448      Core Components/Risk Factors/Patient Goals on Admission    Weight Management  Yes;Weight Loss    Intervention  Weight Management/Obesity: Establish reasonable short term and long term weight goals.;Weight Management: Provide education and appropriate resources to help participant work on and attain dietary goals.;Weight Management: Develop a combined nutrition and exercise program designed to reach desired caloric intake, while maintaining appropriate intake of nutrient and fiber, sodium and fats, and appropriate energy expenditure required for the weight goal.    Admit Weight  150 lb 5.7 oz (68.2 kg)    Goal Weight: Short Term  145 lb (65.8 kg)    Goal Weight: Long Term  140 lb (63.5 kg)    Expected Outcomes   Short Term: Continue to assess and modify interventions until short term weight is achieved;Long Term: Adherence to nutrition and physical activity/exercise program aimed toward attainment of established weight goal;Weight Loss: Understanding of general recommendations for a balanced deficit meal plan, which promotes 1-2 lb weight loss per week and includes a negative energy balance of [phone] kcal/d;Understanding recommendations for meals to include 15-35% energy as protein, 25-35% energy from fat, 35-60% energy from carbohydrates, less than 200mg  of dietary cholesterol, 20-35 gm of total fiber daily;Understanding of distribution of calorie intake throughout the day with the consumption of 4-5 meals/snacks  Improve shortness of breath with ADL's  Yes    Intervention  Provide education, individualized exercise plan and daily activity instruction to help decrease symptoms of SOB with activities of daily living.    Expected Outcomes  Short Term: Improve cardiorespiratory fitness to achieve a reduction of symptoms when performing ADLs;Long Term: Be able to perform more ADLs without symptoms or delay the onset of symptoms       Core Components/Risk Factors/Patient Goals Review:  Goals and Risk Factor Review    Row Name 12/15/18 1634             Core Components/Risk Factors/Patient Goals Review   Personal Goals Review  Weight Management/Obesity;Improve shortness of breath with ADL's;Increase knowledge of respiratory medications and ability to use respiratory devices properly.;Develop more efficient breathing techniques such as purse lipped breathing and diaphragmatic breathing and practicing self-pacing with activity.       Review  Pt has not come to pulmonary rehab exercise yet. Pt was to start on 11/26/18, but has called multiple times to push back her start date       Expected Outcomes  Pt will start rehab on 2/11, which is that last date she called to state she should be starting.           Core Components/Risk Factors/Patient Goals at Discharge (Final Review):  Goals and Risk Factor Review - 12/15/18 1634      Core Components/Risk Factors/Patient Goals Review   Personal Goals Review  Weight Management/Obesity;Improve shortness of breath with ADL's;Increase knowledge of respiratory medications and ability to use respiratory devices properly.;Develop more efficient breathing techniques such as purse lipped breathing and diaphragmatic breathing and practicing self-pacing with activity.    Review  Pt has not come to pulmonary rehab exercise yet. Pt was to start on 11/26/18, but has called multiple times to push back her start date    Expected Outcomes  Pt will start rehab on 2/11, which is that last date she called to state she should be starting.       ITP Comments: ITP Comments    Row Name 12/15/18 1632           ITP Comments  Dr. Jennet Maduro, Medical Director, Pulmonary rehab          Comments: ITP REVIEW Pt to start exercise on 11/26/2018, but has failed to show. Pt states she will start exercise on 12/22/18.  Joycelyn Man RN, BSN Cardiac and Pulmonary Rehab RN

## 2018-12-15 NOTE — Addendum Note (Signed)
Encounter addended by: Towanda Malkin, RN on: 12/15/2018 4:58 PM  Actions taken: Vitals modified, Flowsheet data copied forward, Visit Navigator Flowsheet section accepted, Pend clinical note

## 2018-12-17 ENCOUNTER — Ambulatory Visit (HOSPITAL_COMMUNITY): Payer: PPO

## 2018-12-17 ENCOUNTER — Encounter (HOSPITAL_COMMUNITY): Payer: PPO

## 2018-12-22 ENCOUNTER — Ambulatory Visit (HOSPITAL_COMMUNITY): Payer: PPO

## 2018-12-22 ENCOUNTER — Encounter (HOSPITAL_COMMUNITY)
Admission: RE | Admit: 2018-12-22 | Discharge: 2018-12-22 | Disposition: A | Discharge status: Home / Self Care | Payer: PPO | Source: Ambulatory Visit | Attending: Internal Medicine | Admitting: Internal Medicine

## 2018-12-22 DIAGNOSIS — I2724 Chronic thromboembolic pulmonary hypertension: Secondary | ICD-10-CM | POA: Diagnosis not present

## 2018-12-22 NOTE — Progress Notes (Signed)
Danielle Pratt 68 y.o. female   DOB: [birth year] MRN: [id number]          Nutrition 1. Chronic thromboembolic pulmonary hypertension (HCC)    Past Medical History:  Diagnosis Date  . Anxiety   . Arthritis    "hands" (11/12/2017)  . Cataract    extraction surgery  . DVT (deep venous thrombosis) ([place]) 01/2016   LLE  . Fasting hyperglycemia    110 in 01/2011;131 in 05/2007  . Hypothyroidism   . Popliteal cyst   . Pulmonary embolism ([place]) 2017; 11/11/2017   "the one in 2017 was due to LLE DVT; don't why I had the one 11/11/2017"  . Renal insufficiency   . Tonsillar cancer ([place]) 1998     Meds reviewed.  Current Outpatient Medications (Endocrine & Metabolic):  .  levothyroxine (SYNTHROID, LEVOTHROID) 50 MCG tablet, TAKE 1 AND A HALF TABLETS BY MOUTH DAILY BEFORE BREAKFAST     Current Outpatient Medications (Hematological):  .  apixaban (ELIQUIS) 5 MG TABS tablet, Take 1 tablet (5 mg total) by mouth 2 (two) times daily. -- Office visit needed for further refills  Current Outpatient Medications (Other):  .  clobetasol cream (TEMOVATE) 1.22 %, Apply 1 application topically at bedtime as needed (for skin irritation/finger).  .  pilocarpine (SALAGEN) 5 MG tablet, Take 5 mg by mouth See admin instructions. Take 1 tablet (5 mg) by mouth 3 times daily - early morning, midday and bedtime, may take an additional tablet during the day as needed for dry mouth. .  tretinoin (RETIN-A) 0.05 % cream, Apply 1 application topically at bedtime. [name]  venlafaxine XR (EFFEXOR-XR) 75 MG 24 hr capsule, Take 1 capsule every night at bedtime. Needs office visit for more refills.  Ht: Ht Readings from Last 1 Encounters:  11/16/18 5' 9.25" (1.759 m)     Wt:  Wt Readings from Last 3 Encounters:  11/16/18 150 lb 5.7 oz (68.2 kg)  11/03/18 149 lb 9.6 oz (67.9 kg)  10/22/18 150 lb (68 kg)     BMI: 22.04     Current tobacco use? No  Labs:  Lipid Panel     Component Value Date/Time   CHOL 215 (H)  07/28/2015 1134   TRIG 85.0 07/28/2015 1134   HDL 82.90 07/28/2015 1134   CHOLHDL 3 07/28/2015 1134   VLDL 17.0 07/28/2015 1134   LDLCALC 115 (H) 07/28/2015 1134   LDLDIRECT 161.1 06/09/2013 1603    Lab Results  Component Value Date   HGBA1C 5.6 02/01/2016   Note Spoke with pt. Pt is within a normal weight for age. Pt shared that she gaine ~5 lbs over the holiday season and would like to focus on getting back to her regular weight. Pt eats 3 meals a day; most prepared at home.  She is making healthy food choices the majority of the time. Pt last A1C was on the cusp of prediabetes, 5.6. Discussed the differences between complex and refined carbs, recommended pt replace refined carbs with complex. Reviewed the benefits swapping in complex carbs and moderating portion sizes can have on managing blood glucose with patient. Pt's Rate Your Plate results reviewed with pt. Pt does avoid salty food; uses canned/ convenience food. Pt does not add salt to food.  The role of sodium in lung disease reviewed with pt. Pt expressed understanding of the information reviewed.  Nutrition Diagnosis ? Food-and nutrition-related knowledge deficit related to lack of exposure to information as related to diagnosis of pulmonary disease  Nutrition Intervention ? Pt's individual nutrition plan and goals reviewed with pt. ? Benefits of continuing to adopt healthy eating habits discussed when pt's Rate Your Plate reviewed.  Goal(s) 1. Pt to identify and limit food sources of sodium,saturated fat, trans fats and refined carbohydrates 2. The pt will recognize symptoms that can interfere with adequate oral intake, such as shortness of breath, N/V, early satiety, fatigue, ability to secure and prepare food, taste and smell changes, chewing/swallowing difficulties, and/ or pain when eating. 3. Identify food quantities necessary to achieve wt loss of  -2# per week to a goal wt loss of 5 lb at graduation from pulmonary  rehab. 4. Pt able to name foods that affect blood glucose   Plan:  Pt to attend Pulmonary Nutrition class Will provide client-centered nutrition education as part of interdisciplinary care.    Monitor and Evaluate progress toward nutrition goal with team.   Laurina Bustle, MS, RD, LDN 12/22/2018 12:09 PM

## 2018-12-22 NOTE — Progress Notes (Signed)
Daily Session Note  Patient Details  Name: Danielle Pratt MRN: [id number] Date of Birth: 01-20-1951 Referring Provider:     Pulmonary Rehab Walk Test from 11/19/2018 in [facility]  Referring Provider  Dr. Haroldine Laws      Encounter Date: 12/22/2018  Check In: Session Check In - 12/22/18 1030      Check-In   Supervising physician immediately available to respond to emergencies  [facility] immediately available    Physician(s)  Dr. Broadus John    Location  [facility]    Staff Present  Su Hilt, MS, ACSM RCEP, Exercise Physiologist;Dalton Kris Mouton, MS, Exercise Physiologist;Joan Leonia Reeves, RN, BSN;Carlette Wilber Oliphant, RN, Roque Cash, RN    Medication changes reported      No    Fall or balance concerns reported     No    Tobacco Cessation  No Change    Warm-up and Cool-down  Performed as group-led instruction    Resistance Training Performed  Yes    VAD Patient?  No    PAD/SET Patient?  No      Pain Assessment   Currently in Pain?  No/denies    Multiple Pain Sites  No       Capillary Blood Glucose: No results found for this or any previous visit (from the past 24 hour(s)).    Social History   Tobacco Use  Smoking Status Never Smoker  Smokeless Tobacco Never Used    Goals Met: No cardiac concerns Strength training completed   Goals Unmet:  Not Applicable  Comments: Service time is from 1030 to 1200 With exercise  room air saturation dropped to 83%, had pt to do pursed lip breathing but remained in the 80's. Placed on O2 2L saturation improved to 95%. She exercised on 2L rest of this session.   Dr. Rush Farmer is Medical Director for Pulmonary Rehab at [facility].

## 2018-12-24 ENCOUNTER — Encounter (HOSPITAL_COMMUNITY)
Admission: RE | Admit: 2018-12-24 | Discharge: 2018-12-24 | Disposition: A | Discharge status: Home / Self Care | Payer: PPO | Source: Ambulatory Visit | Attending: Internal Medicine | Admitting: Internal Medicine

## 2018-12-24 ENCOUNTER — Ambulatory Visit (HOSPITAL_COMMUNITY): Payer: PPO

## 2018-12-24 DIAGNOSIS — I2724 Chronic thromboembolic pulmonary hypertension: Secondary | ICD-10-CM | POA: Diagnosis not present

## 2018-12-24 NOTE — Progress Notes (Signed)
Daily Session Note  Patient Details  Name: Danielle Pratt MRN: [id number] Date of Birth: 03/29/1951 Referring Provider:     Pulmonary Rehab Walk Test from 11/19/2018 in [place]  Referring Provider  Dr. Haroldine Laws      Encounter Date: 12/24/2018  Check In: Session Check In - 12/24/18 1053      Check-In   Supervising physician immediately available to respond to emergencies  [facility] immediately available    Physician(s)  Dr. Berle Mull    Location  [facility]    Staff Present  Rosebud Poles, RN, BSN;Carlette Wilber Oliphant, RN, BSN;Dajohn Ellender, RN;Molly DiVincenzo, MS, ACSM RCEP, Exercise Physiologist;Dalton Kris Mouton, MS, Exercise Physiologist    Medication changes reported      No    Fall or balance concerns reported     No    Tobacco Cessation  No Change    Warm-up and Cool-down  Performed as group-led instruction    Resistance Training Performed  Yes    VAD Patient?  No    PAD/SET Patient?  No      Pain Assessment   Currently in Pain?  No/denies    Multiple Pain Sites  No       Capillary Blood Glucose: No results found for this or any previous visit (from the past 24 hour(s)).    Social History   Tobacco Use  Smoking Status Never Smoker  Smokeless Tobacco Never Used    Goals Met:  Exercise tolerated well No report of cardiac concerns or symptoms Strength training completed today  Goals Unmet:  Not Applicable  Comments: Service time is from 1030 to 1215    Dr. Rush Farmer is Medical Director for Pulmonary Rehab at [facility].

## 2018-12-28 ENCOUNTER — Other Ambulatory Visit (HOSPITAL_COMMUNITY): Payer: Self-pay | Admitting: Internal Medicine

## 2018-12-29 ENCOUNTER — Encounter (HOSPITAL_COMMUNITY): Payer: PPO

## 2018-12-29 ENCOUNTER — Ambulatory Visit (HOSPITAL_COMMUNITY): Payer: PPO

## 2018-12-29 ENCOUNTER — Telehealth (HOSPITAL_COMMUNITY): Payer: Self-pay | Admitting: Internal Medicine

## 2018-12-30 ENCOUNTER — Ambulatory Visit (INDEPENDENT_AMBULATORY_CARE_PROVIDER_SITE_OTHER): Payer: PPO | Admitting: Family

## 2018-12-30 ENCOUNTER — Encounter: Payer: Self-pay | Admitting: Family

## 2018-12-30 VITALS — BP 106/70 | HR 91 | Temp 97.9°F | Ht 69.25 in | Wt 153.1 lb

## 2018-12-30 DIAGNOSIS — J019 Acute sinusitis, unspecified: Secondary | ICD-10-CM | POA: Diagnosis not present

## 2018-12-30 MED ORDER — CEFDINIR 300 MG PO CAPS: 300.0000 mg | ORAL_CAPSULE | Freq: Two times a day (BID) | ORAL | 0 refills | Status: DC

## 2018-12-30 MED ORDER — FLUTICASONE PROPIONATE 50 MCG/ACT NA SUSP: 2.0000 | Freq: Every day | NASAL | 6 refills | Status: DC

## 2018-12-30 NOTE — Progress Notes (Signed)
Danielle Pratt is a 68 y.o. female with the following history as recorded in EpicCare:  Patient Active Problem List   Diagnosis Date Noted  . Pain of both elbows 05/15/2018  . Skin abnormality 04/17/2018  . PAH (pulmonary artery hypertension) ([place]) 03/02/2018  . Left leg pain 02/18/2018  . Oropharyngeal dysphagia 01/01/2018  . Hyperphosphatemia 11/14/2017  . Closed fracture of sesamoid bone of foot 06/18/2017  . Postnasal drip 12/02/2016  . Hip dislocation, right ([place]) 02/20/2016  . DVT, RLE 01/31/16 02/20/2016  . Anxiety 02/20/2016  . Pulmonary emboli ([place]) 01/31/2016  . Multiple pigmented nevi 07/28/2015  . Unruptured popliteal cyst 09/07/2013  . HYPERLIPIDEMIA 01/01/2010  . LEFT BUNDLE BRANCH BLOCK 01/01/2010  . OSTEOARTHRITIS 01/01/2010  . Malignant neoplasm of tonsillar fossa ([place]) 12/27/2008  . Hypothyroidism 07/17/2007    Current Outpatient Medications  Medication Sig Dispense Refill  . apixaban (ELIQUIS) 5 MG TABS tablet Take 1 tablet (5 mg total) by mouth 2 (two) times daily. -- Office visit needed for further refills 60 tablet 11  . clobetasol cream (TEMOVATE) 1.19 % Apply 1 application topically at bedtime as needed (for skin irritation/finger).     [name] levothyroxine (SYNTHROID, LEVOTHROID) 50 MCG tablet TAKE 1 AND A HALF TABLETS BY MOUTH DAILY BEFORE BREAKFAST 126 tablet 0  . pilocarpine (SALAGEN) 5 MG tablet Take 5 mg by mouth See admin instructions. Take 1 tablet (5 mg) by mouth 3 times daily - early morning, midday and bedtime, may take an additional tablet during the day as needed for dry mouth.    . tretinoin (RETIN-A) 0.05 % cream Apply 1 application topically at bedtime.    [name] venlafaxine XR (EFFEXOR-XR) 75 MG 24 hr capsule TAKE ONE CAPSULE BY MOUTH EVERY NIGHT AT BEDTIME 30 capsule 0  . cefdinir (OMNICEF) 300 MG capsule Take 1 capsule (300 mg total) by mouth 2 (two) times daily. 20 capsule 0  . fluticasone (FLONASE) 50 MCG/ACT nasal spray Place 2 sprays into  both nostrils daily. 16 g 6   No current facility-administered medications for this visit.     Allergies: Patient has no known allergies.  Past Medical History:  Diagnosis Date  . Anxiety   . Arthritis    "hands" (11/12/2017)  . Cataract    extraction surgery  . DVT (deep venous thrombosis) ([place]) 01/2016   LLE  . Fasting hyperglycemia    110 in 01/2011;131 in 05/2007  . Hypothyroidism   . Popliteal cyst   . Pulmonary embolism ([place]) 2017; 11/11/2017   "the one in 2017 was due to LLE DVT; don't why I had the one 11/11/2017"  . Renal insufficiency   . Tonsillar cancer ([place]) 1998    Past Surgical History:  Procedure Laterality Date  . CESAREAN SECTION  1984   G 1 P 2  . COLONOSCOPY  2003   negative; Dr Henrene Pastor  . JOINT REPLACEMENT    . LYMPHADENECTOMY Right 1998   neck  . RIGHT HEART CATH N/A 10/22/2018   Procedure: RIGHT HEART CATH;  Surgeon: Jolaine Artist, MD;  Location: [facility];  Service: Cardiovascular;  Laterality: N/A;  . RIGHT/LEFT HEART CATH AND CORONARY ANGIOGRAPHY N/A 03/02/2018   Procedure: RIGHT/LEFT HEART CATH AND CORONARY ANGIOGRAPHY;  Surgeon: Jolaine Artist, MD;  Location: [facility];  Service: Cardiovascular;  Laterality: N/A;  . TEMPORARY PACEMAKER N/A 03/02/2018   Procedure: TEMPORARY PACEMAKER;  Surgeon: Jolaine Artist, MD;  Location: [facility];  Service:  Cardiovascular;  Laterality: N/A;  . TONSILLECTOMY AND ADENOIDECTOMY Right 1998   R tonsilar malignancy; subsequent LN dissection negative  . TOTAL ABDOMINAL HYSTERECTOMY  1980s   TOTAL ABDOMINAL HYSTERECTOMY W/ BILATERAL SALPINGOOPHORECTOMY [SHX83]; fibroids; Dr. Nori Riis  . TOTAL HIP ARTHROPLASTY Bilateral 2007   Dr Maureen Ralphs, both hips 3 months apart    Family History  Problem Relation Age of Onset  . Hyperlipidemia Mother   . Hypertension Mother   . Ovarian cancer Mother   . Aneurysm Mother        ascending & descending aorta  . Heart attack Father 36  . Leukemia  Paternal Grandmother   . Lung cancer Paternal Grandfather   . Diabetes Neg Hx   . Stroke Neg Hx   . Colon cancer Neg Hx   . Stomach cancer Neg Hx   . Rectal cancer Neg Hx   . Breast cancer Neg Hx     Social History   Tobacco Use  . Smoking status: Never Smoker  . Smokeless tobacco: Never Used  Substance Use Topics  . Alcohol use: Yes    Alcohol/week: 3.0 standard drinks    Types: 3 Cans of beer per week    Subjective:  Presents with 1 week history of cough/ sinus pain/ pressure; was concerned that her father had similar symptoms and ultimately developed pneumonia; + yellow mucus; no fever; + ears feel full;     Objective:  Vitals:   12/30/18 1442  BP: 106/70  Pulse: 91  Temp: 97.9 F (36.6 C)  TempSrc: Oral  SpO2: 95%  Weight: 153 lb 1.3 oz (69.4 kg)  Height: 5' 9.25" (1.759 m)    General: Well developed, well nourished, in no acute distress  Skin : Warm and dry.  Head: Normocephalic and atraumatic  Eyes: Sclera and conjunctiva clear; pupils round and reactive to light; extraocular movements intact  Ears: External normal; canals clear; tympanic membranes congested bilaterally Oropharynx: Pink, supple. No suspicious lesions  Neck: Supple without thyromegaly, adenopathy  Lungs: Respirations unlabored; clear to auscultation bilaterally without wheeze, rales, rhonchi  CVS exam: normal rate and regular rhythm.  Neurologic: Alert and oriented; speech intact; face symmetrical; moves all extremities well; CNII-XII intact without focal deficit   Assessment:  1. Acute sinusitis, recurrence not specified, unspecified location     Plan:  Rx for Omnicef 300 mg bid x 10 days, Flonase NS; increase fluids, rest and follow-up worse, no better.    No follow-ups on file.  No orders of the defined types were placed in this encounter.   Requested Prescriptions   Signed Prescriptions Disp Refills  . fluticasone (FLONASE) 50 MCG/ACT nasal spray 16 g 6    Sig: Place 2 sprays into  both nostrils daily.  . cefdinir (OMNICEF) 300 MG capsule 20 capsule 0    Sig: Take 1 capsule (300 mg total) by mouth 2 (two) times daily.

## 2018-12-31 ENCOUNTER — Ambulatory Visit (HOSPITAL_COMMUNITY): Payer: PPO

## 2018-12-31 ENCOUNTER — Encounter (HOSPITAL_COMMUNITY): Payer: PPO

## 2019-01-03 DIAGNOSIS — R609 Edema, unspecified: Secondary | ICD-10-CM | POA: Diagnosis not present

## 2019-01-05 ENCOUNTER — Telehealth (HOSPITAL_COMMUNITY): Payer: Self-pay | Admitting: Internal Medicine

## 2019-01-05 ENCOUNTER — Ambulatory Visit (HOSPITAL_COMMUNITY): Payer: PPO

## 2019-01-05 ENCOUNTER — Encounter (HOSPITAL_COMMUNITY): Payer: PPO

## 2019-01-05 IMAGING — CR DG CHEST 2V
2 series · 2 of 2 positions shown · non-contrast
Comparison: 01/31/2016

CLINICAL DATA: Pt complaining of chest pain that radiates to
bilateral hands x this evening. Pt complaining of some
lightheadedness. Pt states has hx of DVT in leg that caused a PE
this past [REDACTED]. Pt denies any SOB.

EXAM:
CHEST  2 VIEW

[chest pa]
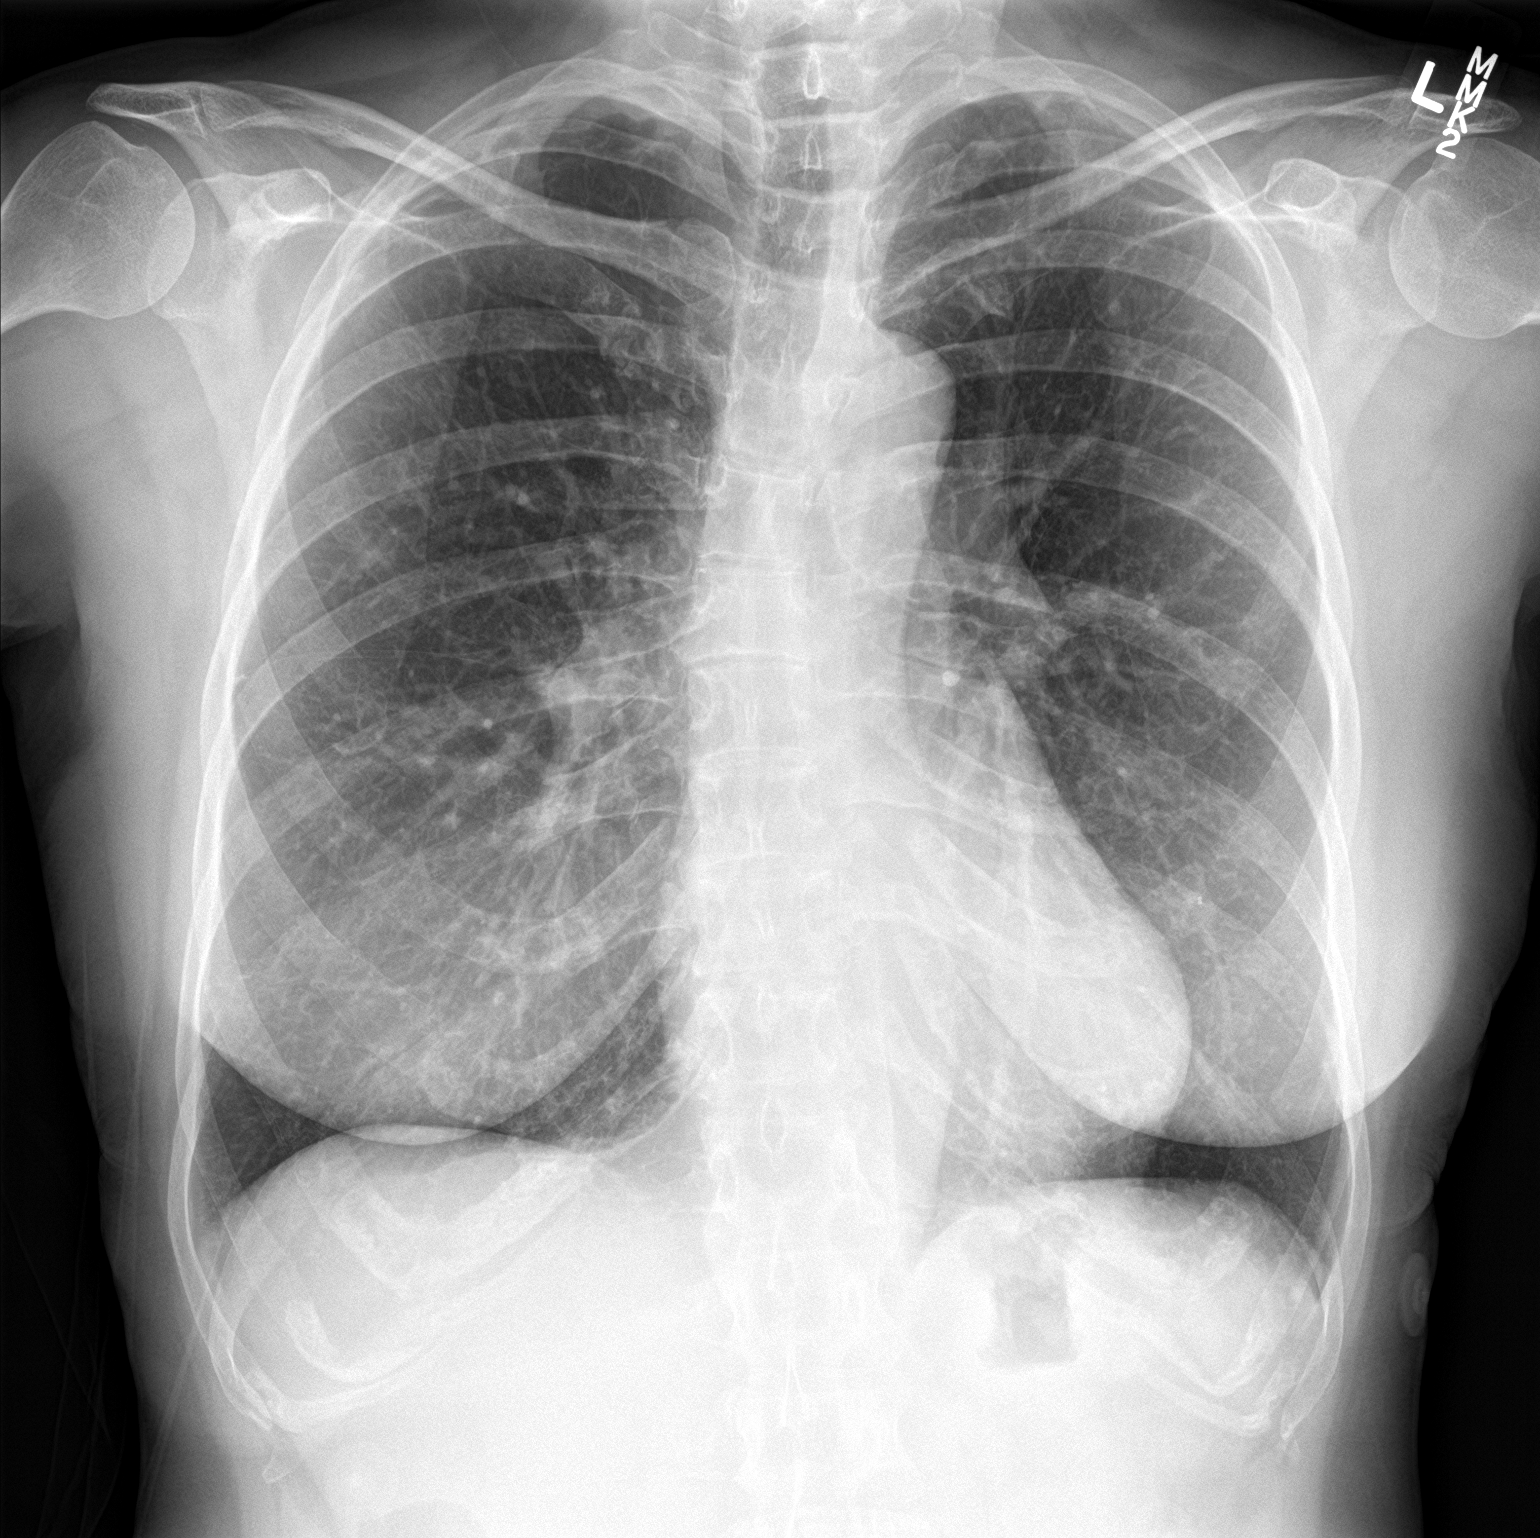

[chest lat]
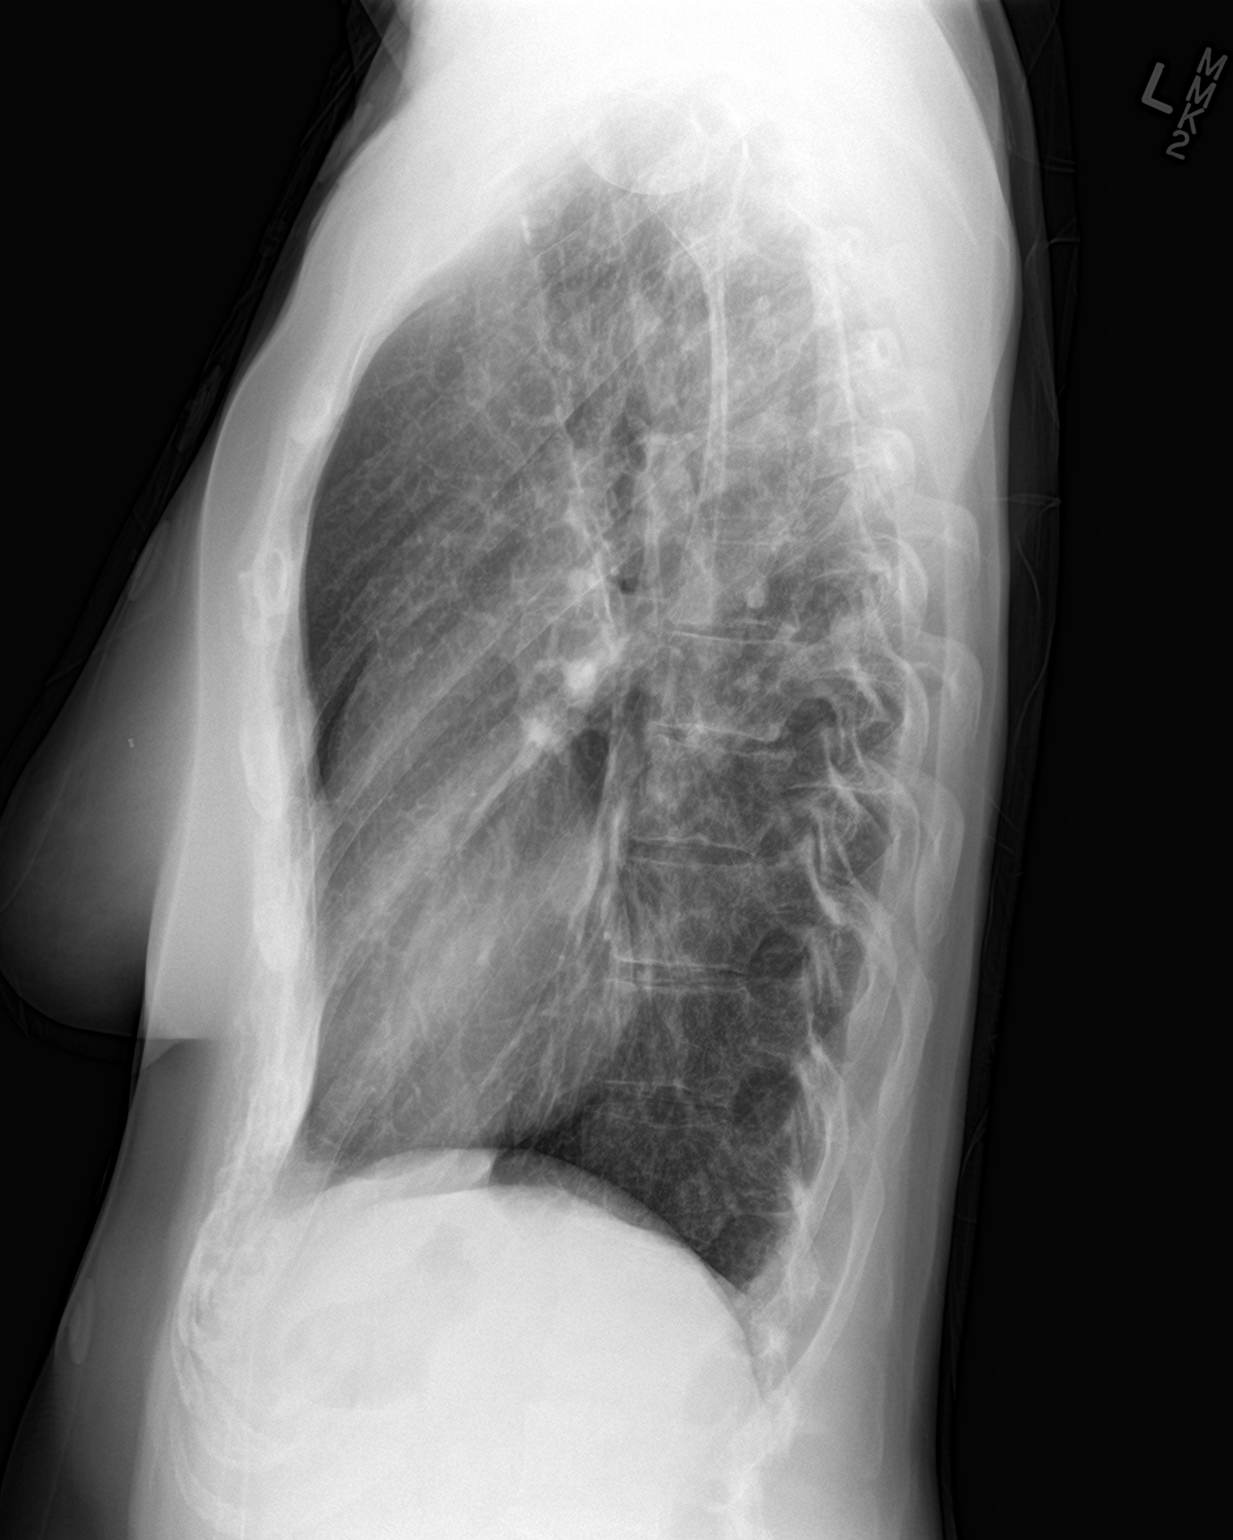

[2 of 2 positions shown; findings below may reference images not displayed]

FINDINGS: Cardiac silhouette is normal in size. No mediastinal or hilar
masses. No evidence of adenopathy.

Lungs are hyperexpanded. There is mild scarring at the apices. No
evidence of pneumonia or pulmonary edema.

No pleural effusion or pneumothorax.

Stable pectus excavatum deformity.  Skeletal structures are intact.
IMPRESSION: No acute cardiopulmonary disease.

## 2019-01-07 ENCOUNTER — Ambulatory Visit (HOSPITAL_COMMUNITY): Payer: PPO

## 2019-01-07 ENCOUNTER — Encounter (HOSPITAL_COMMUNITY): Payer: PPO

## 2019-01-11 ENCOUNTER — Ambulatory Visit (INDEPENDENT_AMBULATORY_CARE_PROVIDER_SITE_OTHER): Payer: PPO | Admitting: Family

## 2019-01-11 ENCOUNTER — Telehealth (HOSPITAL_COMMUNITY): Payer: Self-pay | Admitting: Internal Medicine

## 2019-01-11 ENCOUNTER — Encounter: Payer: Self-pay | Admitting: Family

## 2019-01-11 VITALS — BP 110/74 | HR 96 | Temp 97.8°F | Ht 69.25 in | Wt 153.0 lb

## 2019-01-11 DIAGNOSIS — J019 Acute sinusitis, unspecified: Secondary | ICD-10-CM

## 2019-01-11 DIAGNOSIS — R220 Localized swelling, mass and lump, head: Secondary | ICD-10-CM

## 2019-01-11 MED ORDER — PREDNISONE 20 MG PO TABS: 40.0000 mg | ORAL_TABLET | Freq: Every day | ORAL | 0 refills | Status: DC

## 2019-01-11 MED ORDER — DOXYCYCLINE HYCLATE 100 MG PO TABS: 100.0000 mg | ORAL_TABLET | Freq: Two times a day (BID) | ORAL | 0 refills | Status: DC

## 2019-01-11 NOTE — Progress Notes (Signed)
Danielle Pratt is a 68 y.o. female with the following history as recorded in Epic[employer]:  Patient Active Problem List   Diagnosis Date Noted  . Pain of both elbows 05/15/2018  . Skin abnormality 04/17/2018  . PAH (pulmonary artery hypertension) ([place]) 03/02/2018  . Left leg pain 02/18/2018  . Oropharyngeal dysphagia 01/01/2018  . Hyperphosphatemia 11/14/2017  . Closed fracture of sesamoid bone of foot 06/18/2017  . Postnasal drip 12/02/2016  . Hip dislocation, right ([place]) 02/20/2016  . DVT, RLE 01/31/16 02/20/2016  . Anxiety 02/20/2016  . Pulmonary emboli ([place]) 01/31/2016  . Multiple pigmented nevi 07/28/2015  . Unruptured popliteal cyst 09/07/2013  . HYPERLIPIDEMIA 01/01/2010  . LEFT BUNDLE BRANCH BLOCK 01/01/2010  . OSTEOARTHRITIS 01/01/2010  . Malignant neoplasm of tonsillar fossa ([place]) 12/27/2008  . Hypothyroidism 07/17/2007    Current Outpatient Medications  Medication Sig Dispense Refill  . apixaban (ELIQUIS) 5 MG TABS tablet Take 1 tablet (5 mg total) by mouth 2 (two) times daily. -- Office visit needed for further refills 60 tablet 11  . clobetasol cream (TEMOVATE) 9.41 % Apply 1 application topically at bedtime as needed (for skin irritation/finger).     [name] levothyroxine (SYNTHROID, LEVOTHROID) 50 MCG tablet TAKE 1 AND A HALF TABLETS BY MOUTH DAILY BEFORE BREAKFAST 126 tablet 0  . pilocarpine (SALAGEN) 5 MG tablet Take 5 mg by mouth See admin instructions. Take 1 tablet (5 mg) by mouth 3 times daily - early morning, midday and bedtime, may take an additional tablet during the day as needed for dry mouth.    . tretinoin (RETIN-A) 0.05 % cream Apply 1 application topically at bedtime.    [name] venlafaxine XR (EFFEXOR-XR) 75 MG 24 hr capsule TAKE ONE CAPSULE BY MOUTH EVERY NIGHT AT BEDTIME 30 capsule 0  . doxycycline (VIBRA-TABS) 100 MG tablet Take 1 tablet (100 mg total) by mouth 2 (two) times daily. 20 tablet 0  . fluticasone (FLONASE) 50 MCG/ACT nasal spray Place 2 sprays into  both nostrils daily. (Patient not taking: Reported on 01/11/2019) 16 g 6  . predniSONE (DELTASONE) 20 MG tablet Take 2 tablets (40 mg total) by mouth daily with breakfast. 10 tablet 0   No current facility-administered medications for this visit.     Allergies: Omnicef [cefdinir]  Past Medical History:  Diagnosis Date  . Anxiety   . Arthritis    "hands" (11/12/2017)  . Cataract    extraction surgery  . DVT (deep venous thrombosis) ([place]) 01/2016   LLE  . Fasting hyperglycemia    110 in 01/2011;131 in 05/2007  . Hypothyroidism   . Popliteal cyst   . Pulmonary embolism ([place]) 2017; 11/11/2017   "the one in 2017 was due to LLE DVT; don't why I had the one 11/11/2017"  . Renal insufficiency   . Tonsillar cancer ([place]) 1998    Past Surgical History:  Procedure Laterality Date  . CESAREAN SECTION  1984   G 1 P 2  . COLONOSCOPY  2003   negative; Dr Henrene Pastor  . JOINT REPLACEMENT    . LYMPHADENECTOMY Right 1998   neck  . RIGHT HEART CATH N/A 10/22/2018   Procedure: RIGHT HEART CATH;  Surgeon: Jolaine Artist, MD;  Location: [facility];  Service: Cardiovascular;  Laterality: N/A;  . RIGHT/LEFT HEART CATH AND CORONARY ANGIOGRAPHY N/A 03/02/2018   Procedure: RIGHT/LEFT HEART CATH AND CORONARY ANGIOGRAPHY;  Surgeon: Jolaine Artist, MD;  Location: [facility];  Service: Cardiovascular;  Laterality: N/A;  .  TEMPORARY PACEMAKER N/A 03/02/2018   Procedure: TEMPORARY PACEMAKER;  Surgeon: Jolaine Artist, MD;  Location: [facility];  Service: Cardiovascular;  Laterality: N/A;  . TONSILLECTOMY AND ADENOIDECTOMY Right 1998   R tonsilar malignancy; subsequent LN dissection negative  . TOTAL ABDOMINAL HYSTERECTOMY  1980s   TOTAL ABDOMINAL HYSTERECTOMY W/ BILATERAL SALPINGOOPHORECTOMY [SHX83]; fibroids; Dr. Nori Riis  . TOTAL HIP ARTHROPLASTY Bilateral 2007   Dr Maureen Ralphs, both hips 3 months apart    Family History  Problem Relation Age of Onset  . Hyperlipidemia Mother   .  Hypertension Mother   . Ovarian cancer Mother   . Aneurysm Mother        ascending & descending aorta  . Heart attack Father 52  . Leukemia Paternal Grandmother   . Lung cancer Paternal Grandfather   . Diabetes Neg Hx   . Stroke Neg Hx   . Colon cancer Neg Hx   . Stomach cancer Neg Hx   . Rectal cancer Neg Hx   . Breast cancer Neg Hx     Social History   Tobacco Use  . Smoking status: Never Smoker  . Smokeless tobacco: Never Used  Substance Use Topics  . Alcohol use: Yes    Alcohol/week: 3.0 standard drinks    Types: 3 Cans of beer per week    Subjective:  Patient was seen on 12/30/2018 with suspected sinus infection; was started on Omnicef and by 3-4 days later had to go to [facility] for treatment for swelling in her right cheek; was given steroid injection and steroid pack and responded well to the treatment; was not given 2nd round of antibiotics- only took 4 days of the [facility] before stopping; notes that facial swelling over right cheek has re-started over the weekend; did have a dental crown placed approximately 1 week prior to onset of sinus type symptoms. The crown was done on the upper right side of the mouth- no complications were noted at time of treatment; no fever, no night sweats;     Objective:  Vitals:   01/11/19 0939  BP: 110/74  Pulse: 96  Temp: 97.8 F (36.6 C)  TempSrc: Oral  SpO2: 98%  Weight: 153 lb 0.6 oz (69.4 kg)  Height: 5' 9.25" (1.759 m)    General: Well developed, well nourished, in no acute distress  Skin : Warm and dry.  Head: Normocephalic and atraumatic Facial swelling is noted over right cheek Eyes: Sclera and conjunctiva clear; pupils round and reactive to light; extraocular movements intact  Ears: External normal; canals clear; tympanic membranes normal  Oropharynx: Pink, supple. No suspicious lesions  Neck: Supple without thyromegaly, adenopathy  Lungs: Respirations unlabored;  Neurologic: Alert and oriented; speech intact; face  symmetrical; moves all extremities well; CNII-XII intact without focal deficit   Assessment:  1. Right facial swelling   2. Acute sinusitis, recurrence not specified, unspecified location     Plan:  Am concerned for underlying dental issue causing the sinus symptoms- the localized area of swelling is c/w where she had dental work done 1 week prior to onset of original symptoms last month; will go ahead and treat with Doxycycline and Prednisone; she is encouraged to call her dentist today for re-check however; may also need to consider facial/ sinus imaging; follow-up to be determined.    No follow-ups on file.  No orders of the defined types were placed in this encounter.   Requested Prescriptions   Signed Prescriptions Disp Refills  . doxycycline (VIBRA-TABS) 100  MG tablet 20 tablet 0    Sig: Take 1 tablet (100 mg total) by mouth 2 (two) times daily.  . predniSONE (DELTASONE) 20 MG tablet 10 tablet 0    Sig: Take 2 tablets (40 mg total) by mouth daily with breakfast.

## 2019-01-12 ENCOUNTER — Ambulatory Visit (HOSPITAL_COMMUNITY): Payer: PPO

## 2019-01-12 ENCOUNTER — Encounter (HOSPITAL_COMMUNITY): Payer: PPO

## 2019-01-12 NOTE — Progress Notes (Signed)
Pulmonary Individual Treatment Plan  Patient Details  Name: Danielle Pratt MRN: [id number] Date of Birth: [birth year] Referring Provider:     Pulmonary Rehab Walk Test from 11/19/2018 in [place]  Referring Provider  Dr. Haroldine Laws      Initial Encounter Date:    Pulmonary Rehab Walk Test from 11/19/2018 in [place]  Date  11/19/18      Visit Diagnosis: Chronic thromboembolic pulmonary hypertension ([place])  Patient's Home Medications on Admission:   Current Outpatient Medications:  .  apixaban (ELIQUIS) 5 MG TABS tablet, Take 1 tablet (5 mg total) by mouth 2 (two) times daily. -- Office visit needed for further refills, Disp: 60 tablet, Rfl: 11 .  clobetasol cream (TEMOVATE) 0.86 %, Apply 1 application topically at bedtime as needed (for skin irritation/finger). , Disp: , Rfl:  .  doxycycline (VIBRA-TABS) 100 MG tablet, Take 1 tablet (100 mg total) by mouth 2 (two) times daily., Disp: 20 tablet, Rfl: 0 .  fluticasone (FLONASE) 50 MCG/ACT nasal spray, Place 2 sprays into both nostrils daily. (Patient not taking: Reported on 01/11/2019), Disp: 16 g, Rfl: 6 .  levothyroxine (SYNTHROID, LEVOTHROID) 50 MCG tablet, TAKE 1 AND A HALF TABLETS BY MOUTH DAILY BEFORE BREAKFAST, Disp: 126 tablet, Rfl: 0 .  pilocarpine (SALAGEN) 5 MG tablet, Take 5 mg by mouth See admin instructions. Take 1 tablet (5 mg) by mouth 3 times daily - early morning, midday and bedtime, may take an additional tablet during the day as needed for dry mouth., Disp: , Rfl:  .  predniSONE (DELTASONE) 20 MG tablet, Take 2 tablets (40 mg total) by mouth daily with breakfast., Disp: 10 tablet, Rfl: 0 .  tretinoin (RETIN-A) 0.05 % cream, Apply 1 application topically at bedtime., Disp: , Rfl:  .  venlafaxine XR (EFFEXOR-XR) 75 MG 24 hr capsule, TAKE ONE CAPSULE BY MOUTH EVERY NIGHT AT BEDTIME, Disp: 30 capsule, Rfl: 0  Past Medical History: Past Medical History:    Diagnosis Date  . Anxiety   . Arthritis    "hands" (11/12/2017)  . Cataract    extraction surgery  . DVT (deep venous thrombosis) ([place]) 01/2016   LLE  . Fasting hyperglycemia    110 in 01/2011;131 in 05/2007  . Hypothyroidism   . Popliteal cyst   . Pulmonary embolism ([place]) 2017; 11/11/2017   "the one in 2017 was due to LLE DVT; don't why I had the one 11/11/2017"  . Renal insufficiency   . Tonsillar cancer ([place]) 1998    Tobacco Use: Social History   Tobacco Use  Smoking Status Never Smoker  Smokeless Tobacco Never Used    Labs: Recent Review Flowsheet Data    Labs for ITP Cardiac and Pulmonary Rehab Latest Ref Rng & Units 03/02/2018 03/02/2018 03/02/2018 10/22/2018 10/22/2018   Cholestrol 0 - 200 mg/dL - - - - -   LDLCALC 0 - 99 mg/dL - - - - -   LDLDIRECT mg/dL - - - - -   HDL >39.00 mg/dL - - - - -   Trlycerides 0.0 - 149.0 mg/dL - - - - -   Hemoglobin A1c 4.8 - 5.6 % - - - - -   PHART 7.350 - 7.450 - - 7.335(L) - -   PCO2ART 32.0 - 48.0 mmHg - - 47.9 - -   HCO3 20.0 - 28.0 mmol/L 26.8 27.8 25.5 24.6 28.1(H)   TCO2 22 - 32 mmol/L '28 29 27 26 ' 29  ACIDBASEDEF 0.0 - 2.0 mmol/L - - 1.0 - -   O2SAT % 63.0 61.0 93.0 71.0 67.0      Capillary Blood Glucose: No results found for: GLUCAP   Pulmonary Assessment Scores: Pulmonary Assessment Scores    Row Name 11/16/18 1602         ADL UCSD   ADL Phase  Entry     SOB Score total  3       CAT Score   CAT Score  Pre - 12        Pulmonary Function Assessment:   Exercise Target Goals: Exercise Program Goal: Individual exercise prescription set using results from initial 6 min walk test and THRR while considering  patient's activity barriers and safety.   Exercise Prescription Goal: Initial exercise prescription builds to 30-45 minutes a day of aerobic activity, 2-3 days per week.  Home exercise guidelines will be given to patient during program as part of exercise prescription that the participant will  acknowledge.  Activity Barriers & Risk Stratification: Activity Barriers & Cardiac Risk Stratification - 11/16/18 1422      Activity Barriers & Cardiac Risk Stratification   Activity Barriers  Left Hip Replacement;Right Hip Replacement;Shortness of Breath;Arthritis    Cardiac Risk Stratification  Moderate       6 Minute Walk: 6 Minute Walk    Row Name 11/19/18 1309         6 Minute Walk   Phase  Initial     Distance  1675 feet     Walk Time  6 minutes     # of Rest Breaks  0     MPH  3.17     METS  3.45     RPE  10     Perceived Dyspnea   1     Symptoms  No     Resting HR  101 bpm     Resting BP  116/70     Resting Oxygen Saturation   98 %     Exercise Oxygen Saturation  during 6 min walk  86 %     Max Ex. HR  127 bpm     Max Ex. BP  150/80     2 Minute Post BP  116/76       Interval HR   1 Minute HR  104     2 Minute HR  112     3 Minute HR  117     4 Minute HR  120     5 Minute HR  123     6 Minute HR  127     2 Minute Post HR  116     Interval Heart Rate?  Yes       Interval Oxygen   Interval Oxygen?  Yes     Baseline Oxygen Saturation %  98 %     1 Minute Oxygen Saturation %  97 %     1 Minute Liters of Oxygen  0 L     2 Minute Oxygen Saturation %  92 %     2 Minute Liters of Oxygen  0 L     3 Minute Oxygen Saturation %  89 %     3 Minute Liters of Oxygen  0 L     4 Minute Oxygen Saturation %  88 %     4 Minute Liters of Oxygen  0 L     5 Minute Oxygen Saturation %  86 %  5 Minute Liters of Oxygen  0 L     6 Minute Oxygen Saturation %  86 %     6 Minute Liters of Oxygen  0 L     2 Minute Post Oxygen Saturation %  99 %     2 Minute Post Liters of Oxygen  0 L        Oxygen Initial Assessment: Oxygen Initial Assessment - 11/16/18 1425      Home Oxygen   Home Oxygen Device  None    Sleep Oxygen Prescription  None    Home Exercise Oxygen Prescription  None    Home at Rest Exercise Oxygen Prescription  None      Initial 6 min Walk   Oxygen  Used  None      Program Oxygen Prescription   Program Oxygen Prescription  None      Intervention   Short Term Goals  To learn and exhibit compliance with exercise, home and travel O2 prescription;To learn and understand importance of maintaining oxygen saturations>88%;To learn and demonstrate proper use of respiratory medications;To learn and understand importance of monitoring SPO2 with pulse oximeter and demonstrate accurate use of the pulse oximeter.;To learn and demonstrate proper pursed lip breathing techniques or other breathing techniques.    Long  Term Goals  Exhibits compliance with exercise, home and travel O2 prescription;Verbalizes importance of monitoring SPO2 with pulse oximeter and return demonstration;Maintenance of O2 saturations>88%;Exhibits proper breathing techniques, such as pursed lip breathing or other method taught during program session;Compliance with respiratory medication;Demonstrates proper use of MDI's       Oxygen Re-Evaluation: Oxygen Re-Evaluation    Row Name 11/19/18 1307 12/15/18 1610 01/12/19 0829         Program Oxygen Prescription   Program Oxygen Prescription  None O2 sat dropped to 86 during 37mt. Will need to monitor closely on room air  None  Continuous     Liters per minute  -  -  2     Comments  -  -  Pt desaturated on RA on the Treadmill during her visit. Saturations were maintained on 2L. When pt returns to exercise, we will do another walk test to qualifiy her for home oxygen.        Home Oxygen   Home Oxygen Device  None  None  None     Sleep Oxygen Prescription  None  None  None     Home Exercise Oxygen Prescription  None  None  None     Home at Rest Exercise Oxygen Prescription  None  None  None       Goals/Expected Outcomes   Short Term Goals  To learn and exhibit compliance with exercise, home and travel O2 prescription;To learn and understand importance of maintaining oxygen saturations>88%;To learn and demonstrate proper use of  respiratory medications;To learn and understand importance of monitoring SPO2 with pulse oximeter and demonstrate accurate use of the pulse oximeter.;To learn and demonstrate proper pursed lip breathing techniques or other breathing techniques.  To learn and exhibit compliance with exercise, home and travel O2 prescription;To learn and understand importance of maintaining oxygen saturations>88%;To learn and demonstrate proper use of respiratory medications;To learn and understand importance of monitoring SPO2 with pulse oximeter and demonstrate accurate use of the pulse oximeter.;To learn and demonstrate proper pursed lip breathing techniques or other breathing techniques.  To learn and exhibit compliance with exercise, home and travel O2 prescription;To learn and understand importance of maintaining oxygen saturations>88%;To learn and  demonstrate proper use of respiratory medications;To learn and understand importance of monitoring SPO2 with pulse oximeter and demonstrate accurate use of the pulse oximeter.;To learn and demonstrate proper pursed lip breathing techniques or other breathing techniques.     Long  Term Goals  Exhibits compliance with exercise, home and travel O2 prescription;Verbalizes importance of monitoring SPO2 with pulse oximeter and return demonstration;Maintenance of O2 saturations>88%;Exhibits proper breathing techniques, such as pursed lip breathing or other method taught during program session;Compliance with respiratory medication;Demonstrates proper use of MDI's  Exhibits compliance with exercise, home and travel O2 prescription;Verbalizes importance of monitoring SPO2 with pulse oximeter and return demonstration;Maintenance of O2 saturations>88%;Exhibits proper breathing techniques, such as pursed lip breathing or other method taught during program session;Compliance with respiratory medication;Demonstrates proper use of MDI's  Exhibits compliance with exercise, home and travel O2  prescription;Verbalizes importance of monitoring SPO2 with pulse oximeter and return demonstration;Maintenance of O2 saturations>88%;Exhibits proper breathing techniques, such as pursed lip breathing or other method taught during program session;Compliance with respiratory medication;Demonstrates proper use of MDI's     Goals/Expected Outcomes  compliance  compliance  compliance        Oxygen Discharge (Final Oxygen Re-Evaluation): Oxygen Re-Evaluation - 01/12/19 0829      Program Oxygen Prescription   Program Oxygen Prescription  Continuous    Liters per minute  2    Comments  Pt desaturated on RA on the Treadmill during her visit. Saturations were maintained on 2L. When pt returns to exercise, we will do another walk test to qualifiy her for home oxygen.       Home Oxygen   Home Oxygen Device  None    Sleep Oxygen Prescription  None    Home Exercise Oxygen Prescription  None    Home at Rest Exercise Oxygen Prescription  None      Goals/Expected Outcomes   Short Term Goals  To learn and exhibit compliance with exercise, home and travel O2 prescription;To learn and understand importance of maintaining oxygen saturations>88%;To learn and demonstrate proper use of respiratory medications;To learn and understand importance of monitoring SPO2 with pulse oximeter and demonstrate accurate use of the pulse oximeter.;To learn and demonstrate proper pursed lip breathing techniques or other breathing techniques.    Long  Term Goals  Exhibits compliance with exercise, home and travel O2 prescription;Verbalizes importance of monitoring SPO2 with pulse oximeter and return demonstration;Maintenance of O2 saturations>88%;Exhibits proper breathing techniques, such as pursed lip breathing or other method taught during program session;Compliance with respiratory medication;Demonstrates proper use of MDI's    Goals/Expected Outcomes  compliance       Initial Exercise Prescription: Initial Exercise  Prescription - 11/19/18 1300      Date of Initial Exercise RX and Referring Provider   Date  11/19/18    Referring Provider  Dr. Haroldine Laws      Treadmill   MPH  2    Grade  2    Minutes  17      Bike   Level  0.5    Minutes  17      Recumbant Bike   Level  2    Minutes  17      Prescription Details   Frequency (times per week)  2    Duration  Progress to 45 minutes of aerobic exercise without signs/symptoms of physical distress      Intensity   THRR 40-80% of Max Heartrate  62-123    Ratings of Perceived Exertion  11-13  Perceived Dyspnea  0-4      Progression   Progression  Continue to progress workloads to maintain intensity without signs/symptoms of physical distress.      Resistance Training   Training Prescription  Yes    Weight  orange bands    Reps  10-15       Perform Capillary Blood Glucose checks as needed.  Exercise Prescription Changes:  Exercise Prescription Changes    Row Name 12/24/18 1220             Response to Exercise   Blood Pressure (Admit)  106/70       Blood Pressure (Exercise)  120/74       Blood Pressure (Exit)  98/70       Heart Rate (Admit)  97 bpm       Heart Rate (Exercise)  114 bpm       Heart Rate (Exit)  105 bpm       Oxygen Saturation (Admit)  90 %       Oxygen Saturation (Exercise)  94 %       Oxygen Saturation (Exit)  99 %       Rating of Perceived Exertion (Exercise)  7       Perceived Dyspnea (Exercise)  0       Duration  Progress to 45 minutes of aerobic exercise without signs/symptoms of physical distress       Intensity  - 40-80% HRR         Progression   Progression  Continue to progress workloads to maintain intensity without signs/symptoms of physical distress.         Resistance Training   Training Prescription  Yes       Weight  orange bands       Reps  10-15       Time  10 Minutes         Interval Training   Interval Training  No         Oxygen   Oxygen  Continuous       Liters  2          Treadmill   MPH  2       Grade  2       Minutes  34          Exercise Comments:   Exercise Goals and Review:  Exercise Goals    Row Name 11/16/18 1418             Exercise Goals   Increase Physical Activity  Yes       Intervention  Provide advice, education, support and counseling about physical activity/exercise needs.;Develop an individualized exercise prescription for aerobic and resistive training based on initial evaluation findings, risk stratification, comorbidities and participant's personal goals.       Expected Outcomes  Short Term: Attend rehab on a regular basis to increase amount of physical activity.;Long Term: Exercising regularly at least 3-5 days a week.;Long Term: Add in home exercise to make exercise part of routine and to increase amount of physical activity.       Increase Strength and Stamina  Yes       Intervention  Provide advice, education, support and counseling about physical activity/exercise needs.;Develop an individualized exercise prescription for aerobic and resistive training based on initial evaluation findings, risk stratification, comorbidities and participant's personal goals.       Expected Outcomes  Short Term: Increase workloads from initial exercise prescription for resistance,  speed, and METs.;Short Term: Perform resistance training exercises routinely during rehab and add in resistance training at home;Long Term: Improve cardiorespiratory fitness, muscular endurance and strength as measured by increased METs and functional capacity (6MWT)       Able to understand and use rate of perceived exertion (RPE) scale  Yes       Intervention  Provide education and explanation on how to use RPE scale       Expected Outcomes  Short Term: Able to use RPE daily in rehab to express subjective intensity level;Long Term:  Able to use RPE to guide intensity level when exercising independently       Able to understand and use Dyspnea scale  Yes        Intervention  Provide education and explanation on how to use Dyspnea scale       Expected Outcomes  Short Term: Able to use Dyspnea scale daily in rehab to express subjective sense of shortness of breath during exertion;Long Term: Able to use Dyspnea scale to guide intensity level when exercising independently       Knowledge and understanding of Target Heart Rate Range (THRR)  Yes       Intervention  Provide education and explanation of THRR including how the numbers were predicted and where they are located for reference       Expected Outcomes  Short Term: Able to state/look up THRR;Long Term: Able to use THRR to govern intensity when exercising independently;Short Term: Able to use daily as guideline for intensity in rehab       Understanding of Exercise Prescription  Yes       Intervention  Provide education, explanation, and written materials on patient's individual exercise prescription       Expected Outcomes  Short Term: Able to explain program exercise prescription;Long Term: Able to explain home exercise prescription to exercise independently          Exercise Goals Re-Evaluation : Exercise Goals Re-Evaluation    Row Name 12/15/18 0818 01/12/19 0830           Exercise Goal Re-Evaluation   Exercise Goals Review  Increase Physical Activity;Increase Strength and Stamina;Able to understand and use rate of perceived exertion (RPE) scale;Able to understand and use Dyspnea scale;Knowledge and understanding of Target Heart Rate Range (THRR);Understanding of Exercise Prescription  Increase Physical Activity;Increase Strength and Stamina;Able to understand and use rate of perceived exertion (RPE) scale;Able to understand and use Dyspnea scale;Knowledge and understanding of Target Heart Rate Range (THRR);Understanding of Exercise Prescription      Comments  Pt completed her walk test almost a month ago and has yet to start exercise. Called out for the third week in a row. If pt does not come for  exercise next week she will be dropped.   Pt has came to 2 exercise sessions and has now missed 2 weeks in a row. Pt has had continual absences. Pt has already called out today, and if she is absent on Thursday, I will have to discuss with her the possibility of dropping her from the program. If pt returns, I will need to do another 6MWT on her so that she can qualify for oxygen. Pt has only completed 2 exercise stations, but seemed eager to exercise. Will continue to monitor and progress as able.       Expected Outcomes  Through exercise at rehab and at home, the patient will decrease shortness of breath with daily activities and feel confident in  carrying out an exercise regime at home.   Through exercise at rehab and at home, the patient will decrease shortness of breath with daily activities and feel confident in carrying out an exercise regime at home.          Discharge Exercise Prescription (Final Exercise Prescription Changes): Exercise Prescription Changes - 12/24/18 1220      Response to Exercise   Blood Pressure (Admit)  106/70    Blood Pressure (Exercise)  120/74    Blood Pressure (Exit)  98/70    Heart Rate (Admit)  97 bpm    Heart Rate (Exercise)  114 bpm    Heart Rate (Exit)  105 bpm    Oxygen Saturation (Admit)  90 %    Oxygen Saturation (Exercise)  94 %    Oxygen Saturation (Exit)  99 %    Rating of Perceived Exertion (Exercise)  7    Perceived Dyspnea (Exercise)  0    Duration  Progress to 45 minutes of aerobic exercise without signs/symptoms of physical distress    Intensity  --   40-80% HRR     Progression   Progression  Continue to progress workloads to maintain intensity without signs/symptoms of physical distress.      Resistance Training   Training Prescription  Yes    Weight  orange bands    Reps  10-15    Time  10 Minutes      Interval Training   Interval Training  No      Oxygen   Oxygen  Continuous    Liters  2      Treadmill   MPH  2    Grade  2     Minutes  34       Nutrition:  Target Goals: Understanding of nutrition guidelines, daily intake of sodium <1515m, cholesterol <2063m calories 30% from fat and 7% or less from saturated fats, daily to have 5 or more servings of fruits and vegetables.  Biometrics:    Nutrition Therapy Plan and Nutrition Goals: Nutrition Therapy & Goals - 12/22/18 1213      Nutrition Therapy   Diet  heart healthy, carb modified      Personal Nutrition Goals   Nutrition Goal  Pt to identify and limit food sources of sodium,saturated fat, trans fats and refined carbohydrates    Personal Goal #2  The pt will recognize symptoms that can interfere with adequate oral intake    Personal Goal #3  Identify food quantities necessary to achieve wt loss of  -2# per week to a goal wt loss of 5 lb at graduation from pulmonary rehab    Personal Goal #4  Pt able to name foods that affect blood glucose       Intervention Plan   Intervention  Prescribe, educate and counsel regarding individualized specific dietary modifications aiming towards targeted core components such as weight, hypertension, lipid management, diabetes, heart failure and other comorbidities.    Expected Outcomes  Short Term Goal: Understand basic principles of dietary content, such as calories, fat, sodium, cholesterol and nutrients.;Long Term Goal: Adherence to prescribed nutrition plan.       Nutrition Assessments: Nutrition Assessments - 12/03/18 1417      Rate Your Plate Scores   Pre Score  38       Nutrition Goals Re-Evaluation: Nutrition Goals Re-Evaluation    [place] 12/03/18 1416             Goals   Current  Weight  150 lb 5.7 oz (68.2 kg)          Nutrition Goals Discharge (Final Nutrition Goals Re-Evaluation): Nutrition Goals Re-Evaluation - 12/03/18 1416      Goals   Current Weight  150 lb 5.7 oz (68.2 kg)       Psychosocial: Target Goals: Acknowledge presence or absence of significant depression and/or  stress, maximize coping skills, provide positive support system. Participant is able to verbalize types and ability to use techniques and skills needed for reducing stress and depression.  Initial Review & Psychosocial Screening: Initial Psych Review & Screening - 11/16/18 1443      Initial Review   Current issues with  History of Depression      Family Dynamics   Good Support System?  Yes    Comments  husband and father who is 43 and in good health      Barriers   Psychosocial barriers to participate in program  There are no identifiable barriers or psychosocial needs.;The patient should benefit from training in stress management and relaxation.      Screening Interventions   Interventions  Encouraged to exercise    Expected Outcomes  Long Term goal: The participant improves quality of Life and PHQ9 Scores as seen by post scores and/or verbalization of changes;Short Term goal: Identification and review with participant of any Quality of Life or Depression concerns found by scoring the questionnaire.       Quality of Life Scores:  Scores of 19 and below usually indicate a poorer quality of life in these areas.  A difference of  2-3 points is a clinically meaningful difference.  A difference of 2-3 points in the total score of the Quality of Life Index has been associated with significant improvement in overall quality of life, self-image, physical symptoms, and general health in studies assessing change in quality of life.  PHQ-9: Recent Review Flowsheet Data    Depression screen [facility] 2/9 11/16/2018 11/20/2017 06/09/2013   Decreased Interest 1 0 0   Down, Depressed, Hopeless 1 0 0   PHQ - 2 Score 2 0 0   Altered sleeping 0  - -   Tired, decreased energy 1 - -   Change in appetite 0 - -   Feeling bad or failure about yourself  1  - -   Trouble concentrating 0 - -   Moving slowly or fidgety/restless 0 - -   Suicidal thoughts 0 - -   PHQ-9 Score 4 - -   Difficult doing work/chores Not  difficult at all - -     Interpretation of Total Score  Total Score Depression Severity:  1-4 = Minimal depression, 5-9 = Mild depression, 10-14 = Moderate depression, 15-19 = Moderately severe depression, 20-27 = Severe depression   Psychosocial Evaluation and Intervention: Psychosocial Evaluation - 01/12/19 0830      Psychosocial Evaluation & Interventions   Interventions  Stress management education;Relaxation education;Encouraged to exercise with the program and follow exercise prescription    Comments  Pt is pleasant when she attends rehab. Pt has had to take care of her father and some illness that has hindered her to participate in rehab.    Expected Outcomes  Pt will report having decrease in frequency of blue days. Pt will also more consistantly attend.    Continue Psychosocial Services   Follow up required by staff       Psychosocial Re-Evaluation: Psychosocial Re-Evaluation    [place] Name 12/15/18 [phone]  Psychosocial Re-Evaluation   Current issues with  History of Depression       Comments  Pt admits she does have "blue" days but she takes medication which helps.       Expected Outcomes  Pt will report a decrease in blue days       Continue Psychosocial Services   Follow up required by staff          Psychosocial Discharge (Final Psychosocial Re-Evaluation): Psychosocial Re-Evaluation - 12/15/18 1633      Psychosocial Re-Evaluation   Current issues with  History of Depression    Comments  Pt admits she does have "blue" days but she takes medication which helps.    Expected Outcomes  Pt will report a decrease in blue days    Continue Psychosocial Services   Follow up required by staff       Education: Education Goals: Education classes will be provided on a weekly basis, covering required topics. Participant will state understanding/return demonstration of topics presented.  Learning Barriers/Preferences: Learning Barriers/Preferences - 11/16/18 1445       Learning Barriers/Preferences   Learning Barriers  Sight    Learning Preferences  Group Instruction;Skilled Demonstration;Individual Instruction;Verbal Instruction       Education Topics: Risk Factor Reduction:  -Group instruction that is supported by a PowerPoint presentation. Instructor discusses the definition of a risk factor, different risk factors for pulmonary disease, and how the heart and lungs work together.     Nutrition for Pulmonary Patient:  -Group instruction provided by PowerPoint slides, verbal discussion, and written materials to support subject matter. The instructor gives an explanation and review of healthy diet recommendations, which includes a discussion on weight management, recommendations for fruit and vegetable consumption, as well as protein, fluid, caffeine, fiber, sodium, sugar, and alcohol. Tips for eating when patients are short of breath are discussed.   Pursed Lip Breathing:  -Group instruction that is supported by demonstration and informational handouts. Instructor discusses the benefits of pursed lip and diaphragmatic breathing and detailed demonstration on how to preform both.     Oxygen Safety:  -Group instruction provided by PowerPoint, verbal discussion, and written material to support subject matter. There is an overview of "What is Oxygen" and "Why do we need it".  Instructor also reviews how to create a safe environment for oxygen use, the importance of using oxygen as prescribed, and the risks of noncompliance. There is a brief discussion on traveling with oxygen and resources the patient may utilize.   Oxygen Equipment:  -Group instruction provided by [facility] Staff utilizing handouts, written materials, and equipment demonstrations.   Signs and Symptoms:  -Group instruction provided by written material and verbal discussion to support subject matter. Warning signs and symptoms of infection, stroke, and heart attack are reviewed and  when to call the physician/911 reinforced. Tips for preventing the spread of infection discussed.   Advanced Directives:  -Group instruction provided by verbal instruction and written material to support subject matter. Instructor reviews Advanced Directive laws and proper instruction for filling out document.   Pulmonary Video:  -Group video education that reviews the importance of medication and oxygen compliance, exercise, good nutrition, pulmonary hygiene, and pursed lip and diaphragmatic breathing for the pulmonary patient.   Exercise for the Pulmonary Patient:  -Group instruction that is supported by a PowerPoint presentation. Instructor discusses benefits of exercise, core components of exercise, frequency, duration, and intensity of an exercise routine, importance of utilizing pulse oximetry during exercise, safety while  exercising, and options of places to exercise outside of rehab.     Pulmonary Medications:  -Verbally interactive group education provided by instructor with focus on inhaled medications and proper administration.   Anatomy and Physiology of the Respiratory System and Intimacy:  -Group instruction provided by PowerPoint, verbal discussion, and written material to support subject matter. Instructor reviews respiratory cycle and anatomical components of the respiratory system and their functions. Instructor also reviews differences in obstructive and restrictive respiratory diseases with examples of each. Intimacy, Sex, and Sexuality differences are reviewed with a discussion on how relationships can change when diagnosed with pulmonary disease. Common sexual concerns are reviewed.   MD DAY -A group question and answer session with a medical doctor that allows participants to ask questions that relate to their pulmonary disease state.   OTHER EDUCATION -Group or individual verbal, written, or video instructions that support the educational goals of the pulmonary  rehab program.   PULMONARY REHAB OTHER RESPIRATORY from 12/24/2018 in [place]  Date  12/24/18 [[facility]]  Educator  Iona Beard  Instruction Review Code  1- Verbalizes Understanding      Holiday Eating Survival Tips:  -Group instruction provided by [employer], verbal discussion, and written materials to support subject matter. The instructor gives patients tips, tricks, and techniques to help them not only survive but enjoy the holidays despite the onslaught of food that accompanies the holidays.   Knowledge Questionnaire Score: Knowledge Questionnaire Score - 11/16/18 1447      Knowledge Questionnaire Score   Pre Score  17/18       Core Components/Risk Factors/Patient Goals at Admission: Personal Goals and Risk Factors at Admission - 11/16/18 1448      Core Components/Risk Factors/Patient Goals on Admission    Weight Management  Yes;Weight Loss    Intervention  Weight Management/Obesity: Establish reasonable short term and long term weight goals.;Weight Management: Provide education and appropriate resources to help participant work on and attain dietary goals.;Weight Management: Develop a combined nutrition and exercise program designed to reach desired caloric intake, while maintaining appropriate intake of nutrient and fiber, sodium and fats, and appropriate energy expenditure required for the weight goal.    Admit Weight  150 lb 5.7 oz (68.2 kg)    Goal Weight: Short Term  145 lb (65.8 kg)    Goal Weight: Long Term  140 lb (63.5 kg)    Expected Outcomes  Short Term: Continue to assess and modify interventions until short term weight is achieved;Long Term: Adherence to nutrition and physical activity/exercise program aimed toward attainment of established weight goal;Weight Loss: Understanding of general recommendations for a balanced deficit meal plan, which promotes 1-2 lb weight loss per week and includes a negative energy balance of [phone]  kcal/d;Understanding recommendations for meals to include 15-35% energy as protein, 25-35% energy from fat, 35-60% energy from carbohydrates, less than 283m of dietary cholesterol, 20-35 gm of total fiber daily;Understanding of distribution of calorie intake throughout the day with the consumption of 4-5 meals/snacks    Improve shortness of breath with ADL's  Yes    Intervention  Provide education, individualized exercise plan and daily activity instruction to help decrease symptoms of SOB with activities of daily living.    Expected Outcomes  Short Term: Improve cardiorespiratory fitness to achieve a reduction of symptoms when performing ADLs;Long Term: Be able to perform more ADLs without symptoms or delay the onset of symptoms       Core Components/Risk Factors/Patient Goals  Review:  Goals and Risk Factor Review    Row Name 12/15/18 1634 01/12/19 0832           Core Components/Risk Factors/Patient Goals Review   Personal Goals Review  Weight Management/Obesity;Improve shortness of breath with ADL's;Increase knowledge of respiratory medications and ability to use respiratory devices properly.;Develop more efficient breathing techniques such as purse lipped breathing and diaphragmatic breathing and practicing self-pacing with activity.  Weight Management/Obesity;Improve shortness of breath with ADL's;Increase knowledge of respiratory medications and ability to use respiratory devices properly.;Develop more efficient breathing techniques such as purse lipped breathing and diaphragmatic breathing and practicing self-pacing with activity.      Review  Pt has not come to pulmonary rehab exercise yet. Pt was to start on 11/26/18, but has called multiple times to push back her start date  Pt has completed 2 exercise sessions and 1 education class. Pt attendance has been inconsistent and she has called out of rehab for multiple weeks d/t illness of her father and her self as well as some dental work that  she has had done. She was able to complete exercise with workloads of 2.0/2.0 on the treadmill which she progressed to from the track quickly. Pt also completed workloads of 0.4 on the airdyne and level 2 on the recumbent bike. Will continue to monitor pt for progress in next 30 day review..      Expected Outcomes  Pt will start rehab on 2/11, which is that last date she called to state she should be starting.  see admission goals and outcomes         Core Components/Risk Factors/Patient Goals at Discharge (Final Review):  Goals and Risk Factor Review - 01/12/19 0832      Core Components/Risk Factors/Patient Goals Review   Personal Goals Review  Weight Management/Obesity;Improve shortness of breath with ADL's;Increase knowledge of respiratory medications and ability to use respiratory devices properly.;Develop more efficient breathing techniques such as purse lipped breathing and diaphragmatic breathing and practicing self-pacing with activity.    Review  Pt has completed 2 exercise sessions and 1 education class. Pt attendance has been inconsistent and she has called out of rehab for multiple weeks d/t illness of her father and her self as well as some dental work that she has had done. She was able to complete exercise with workloads of 2.0/2.0 on the treadmill which she progressed to from the track quickly. Pt also completed workloads of 0.4 on the airdyne and level 2 on the recumbent bike. Will continue to monitor pt for progress in next 30 day review..    Expected Outcomes  see admission goals and outcomes       ITP Comments: ITP Comments    Row Name 12/15/18 1632 01/12/19 0830         ITP Comments  Dr. Jennet Maduro, Medical Director, Pulmonary rehab  Dr. Jennet Maduro, Medical Director, Pulmonary rehab         Comments: ITP REVIEW Pt is making expected progress toward personal goals after completing 2 sessions. Recommend continued exercise, life style modification, education, and  utilization of breathing techniques to increase stamina and strength and decrease shortness of breath with exertion.  Joycelyn Man RN, BSN Cardiac and Pulmonary Rehab RN

## 2019-01-14 ENCOUNTER — Encounter (HOSPITAL_COMMUNITY)
Admission: RE | Admit: 2019-01-14 | Discharge: 2019-01-14 | Disposition: A | Discharge status: Home / Self Care | Payer: PPO | Source: Ambulatory Visit | Attending: Internal Medicine | Admitting: Internal Medicine

## 2019-01-14 ENCOUNTER — Ambulatory Visit (HOSPITAL_COMMUNITY): Payer: PPO

## 2019-01-14 DIAGNOSIS — I2724 Chronic thromboembolic pulmonary hypertension: Secondary | ICD-10-CM | POA: Insufficient documentation

## 2019-01-14 NOTE — Progress Notes (Signed)
Daily Session Note  Patient Details  Name: Wladyslawa Disbro MRN: [id number] Date of Birth: 1951/10/09 Referring Provider:     Pulmonary Rehab Walk Test from 11/19/2018 in [place]  Referring Provider  Dr. Haroldine Laws      Encounter Date: 01/14/2019  Check In: Session Check In - 01/14/19 1030      Check-In   Supervising physician immediately available to respond to emergencies  [facility] immediately available    Physician(s)  Dr. Wynelle Cleveland    Location  [facility]    Staff Present  Joycelyn Man RN, BSN;Molly DiVincenzo, MS, ACSM RCEP, Exercise Physiologist;Dalton Kris Mouton, MS, Exercise Physiologist;Janyth Riera Leonia Reeves, RN, Roque Cash, RN    Medication changes reported      No    Fall or balance concerns reported     No    Tobacco Cessation  No Change    Warm-up and Cool-down  Performed as group-led instruction    Resistance Training Performed  Yes    VAD Patient?  No    PAD/SET Patient?  No      Pain Assessment   Currently in Pain?  No/denies    Multiple Pain Sites  No       Capillary Blood Glucose: No results found for this or any previous visit (from the past 24 hour(s)).    Social History   Tobacco Use  Smoking Status Never Smoker  Smokeless Tobacco Never Used    Goals Met:  Proper associated with RPD/PD & O2 Sat Exercise tolerated well Strength training completed today  Goals Unmet:  Not Applicable  Comments: Service time is from 1030 to 1200    Dr. Rush Farmer is Medical Director for Pulmonary Rehab at [facility].

## 2019-01-19 ENCOUNTER — Ambulatory Visit (HOSPITAL_COMMUNITY): Payer: PPO

## 2019-01-19 ENCOUNTER — Encounter (HOSPITAL_COMMUNITY)
Admission: RE | Admit: 2019-01-19 | Discharge: 2019-01-19 | Disposition: A | Discharge status: Home / Self Care | Payer: PPO | Source: Ambulatory Visit | Attending: Internal Medicine | Admitting: Internal Medicine

## 2019-01-19 DIAGNOSIS — I2724 Chronic thromboembolic pulmonary hypertension: Secondary | ICD-10-CM

## 2019-01-19 NOTE — Progress Notes (Signed)
Daily Session Note  Patient Details  Name: Danielle Pratt MRN: [id number] Date of Birth: 10/06/1951 Referring Provider:     Pulmonary Rehab Walk Test from 11/19/2018 in [place]  Referring Provider  Dr. Haroldine Laws      Encounter Date: 01/19/2019  Check In: Session Check In - 01/19/19 1122      Check-In   Supervising physician immediately available to respond to emergencies  [facility] immediately available    Physician(s)  Dr. Jonnie Finner    Location  [facility]    Staff Present  Su Hilt, MS, ACSM RCEP, Exercise Physiologist;Dalton Kris Mouton, MS, Exercise Physiologist;Cali Hope Leonia Reeves, RN, Roque Cash, RN;Other    Medication changes reported      No    Fall or balance concerns reported     No    Tobacco Cessation  No Change    Warm-up and Cool-down  Performed as group-led instruction    Resistance Training Performed  Yes    VAD Patient?  No    PAD/SET Patient?  No      Pain Assessment   Currently in Pain?  No/denies    Multiple Pain Sites  No       Capillary Blood Glucose: No results found for this or any previous visit (from the past 24 hour(s)).    Social History   Tobacco Use  Smoking Status Never Smoker  Smokeless Tobacco Never Used    Goals Met:  Proper associated with RPD/PD & O2 Sat Exercise tolerated well Strength training completed today  Goals Unmet:  Not Applicable  Comments: Service time is from 1030 to [place]    Dr. Rush Farmer is Medical Director for Pulmonary Rehab at [facility].

## 2019-01-21 ENCOUNTER — Other Ambulatory Visit: Payer: Self-pay

## 2019-01-21 ENCOUNTER — Ambulatory Visit (HOSPITAL_COMMUNITY): Payer: PPO

## 2019-01-21 ENCOUNTER — Encounter (HOSPITAL_COMMUNITY)
Admission: RE | Admit: 2019-01-21 | Discharge: 2019-01-21 | Disposition: A | Discharge status: Home / Self Care | Payer: PPO | Source: Ambulatory Visit | Attending: Internal Medicine | Admitting: Internal Medicine

## 2019-01-21 VITALS — Wt 153.0 lb

## 2019-01-21 DIAGNOSIS — I2724 Chronic thromboembolic pulmonary hypertension: Secondary | ICD-10-CM

## 2019-01-21 NOTE — Progress Notes (Signed)
Daily Session Note  Patient Details  Name: Danielle Pratt MRN: [id number] Date of Birth: 1950-11-20 Referring Provider:     Pulmonary Rehab Walk Test from 11/19/2018 in [place]  Referring Provider  Dr. Haroldine Laws      Encounter Date: 01/21/2019  Check In: Session Check In - 01/21/19 1109      Check-In   Supervising physician immediately available to respond to emergencies  [facility] immediately available    Physician(s)  Dr. Cathlean Sauer    Location  [facility]    Staff Present  Su Hilt, MS, ACSM RCEP, Exercise Physiologist;Dalton Kris Mouton, MS, Exercise Physiologist;Lisa Ysidro Evert, RN;Other    Medication changes reported      No    Fall or balance concerns reported     No    Tobacco Cessation  No Change    Warm-up and Cool-down  Performed as group-led instruction    Resistance Training Performed  Yes    VAD Patient?  No    PAD/SET Patient?  No      Pain Assessment   Currently in Pain?  No/denies    Multiple Pain Sites  No       Capillary Blood Glucose: No results found for this or any previous visit (from the past 24 hour(s)).    Social History   Tobacco Use  Smoking Status Never Smoker  Smokeless Tobacco Never Used    Goals Met:  Exercise tolerated well  Goals Unmet:  Not Applicable  Comments: Service time is from 10:30a to 12:05p    Dr. Rush Farmer is Medical Director for Pulmonary Rehab at [facility].

## 2019-01-25 ENCOUNTER — Telehealth (HOSPITAL_COMMUNITY): Payer: Self-pay

## 2019-01-25 NOTE — Telephone Encounter (Signed)
Pt called and message left informing pt of Cardiac and Pulmonary rehab closure for the next 2 weeks.   Caileen Veracruz RN, BSN Cardiac and Pulmonary Rehab RN  

## 2019-01-26 ENCOUNTER — Ambulatory Visit (HOSPITAL_COMMUNITY): Payer: PPO

## 2019-01-26 ENCOUNTER — Encounter (HOSPITAL_COMMUNITY): Payer: PPO

## 2019-01-26 ENCOUNTER — Telehealth (HOSPITAL_COMMUNITY): Payer: Self-pay

## 2019-01-27 ENCOUNTER — Other Ambulatory Visit: Payer: Self-pay | Admitting: Internal Medicine

## 2019-01-28 ENCOUNTER — Encounter (HOSPITAL_COMMUNITY): Payer: PPO

## 2019-01-28 ENCOUNTER — Ambulatory Visit (HOSPITAL_COMMUNITY): Payer: PPO

## 2019-02-01 ENCOUNTER — Encounter (HOSPITAL_COMMUNITY): Payer: Self-pay

## 2019-02-01 NOTE — Progress Notes (Signed)
Pulmonary Individual Treatment Plan  Patient Details  Name: Danielle Pratt MRN: [id number] Date of Birth: 03-Jun-1951 Referring Provider:     Pulmonary Rehab Walk Test from 11/19/2018 in [facility]  Referring Provider  Dr. Haroldine Laws      Initial Encounter Date:    Pulmonary Rehab Walk Test from 11/19/2018 in [facility]  Date  11/19/18      Visit Diagnosis: Chronic thromboembolic pulmonary hypertension ([place])  Patient's Home Medications on Admission:   Current Outpatient Medications:    apixaban (ELIQUIS) 5 MG TABS tablet, Take 1 tablet (5 mg total) by mouth 2 (two) times daily. -- Office visit needed for further refills, Disp: 60 tablet, Rfl: 11   clobetasol cream (TEMOVATE) 3.25 %, Apply 1 application topically at bedtime as needed (for skin irritation/finger). , Disp: , Rfl:    doxycycline (VIBRA-TABS) 100 MG tablet, Take 1 tablet (100 mg total) by mouth 2 (two) times daily., Disp: 20 tablet, Rfl: 0   fluticasone (FLONASE) 50 MCG/ACT nasal spray, Place 2 sprays into both nostrils daily. (Patient not taking: Reported on 01/11/2019), Disp: 16 g, Rfl: 6   levothyroxine (SYNTHROID, LEVOTHROID) 50 MCG tablet, Take 1 and 1/2 tablets by mouth daily before breakfast. Need office visit for more refills., Disp: 44 tablet, Rfl: 0   pilocarpine (SALAGEN) 5 MG tablet, Take 5 mg by mouth See admin instructions. Take 1 tablet (5 mg) by mouth 3 times daily - early morning, midday and bedtime, may take an additional tablet during the day as needed for dry mouth., Disp: , Rfl:    predniSONE (DELTASONE) 20 MG tablet, Take 2 tablets (40 mg total) by mouth daily with breakfast., Disp: 10 tablet, Rfl: 0   tretinoin (RETIN-A) 0.05 % cream, Apply 1 application topically at bedtime., Disp: , Rfl:    venlafaxine XR (EFFEXOR-XR) 75 MG 24 hr capsule, TAKE ONE CAPSULE BY MOUTH EVERY NIGHT AT BEDTIME. Need office visit for more refills., Disp: 30  capsule, Rfl: 0  Past Medical History: Past Medical History:  Diagnosis Date   Anxiety    Arthritis    "hands" (11/12/2017)   Cataract    extraction surgery   DVT (deep venous thrombosis) ([place]) 01/2016   LLE   Fasting hyperglycemia    110 in 01/2011;131 in 05/2007   Hypothyroidism    Popliteal cyst    Pulmonary embolism ([place]) 2017; 11/11/2017   "the one in 2017 was due to LLE DVT; don't why I had the one 11/11/2017"   Renal insufficiency    Tonsillar cancer ([place]) 1998    Tobacco Use: Social History   Tobacco Use  Smoking Status Never Smoker  Smokeless Tobacco Never Used    Labs: Recent Review Flowsheet Data    Labs for ITP Cardiac and Pulmonary Rehab Latest Ref Rng & Units 03/02/2018 03/02/2018 03/02/2018 10/22/2018 10/22/2018   Cholestrol 0 - 200 mg/dL - - - - -   LDLCALC 0 - 99 mg/dL - - - - -   LDLDIRECT mg/dL - - - - -   HDL >39.00 mg/dL - - - - -   Trlycerides 0.0 - 149.0 mg/dL - - - - -   Hemoglobin A1c 4.8 - 5.6 % - - - - -   PHART 7.350 - 7.450 - - 7.335(L) - -   PCO2ART 32.0 - 48.0 mmHg - - 47.9 - -   HCO3 20.0 - 28.0 mmol/L 26.8 27.8 25.5 24.6 28.1(H)  TCO2 22 - 32 mmol/L '28 29 27 26 29   ' ACIDBASEDEF 0.0 - 2.0 mmol/L - - 1.0 - -   O2SAT % 63.0 61.0 93.0 71.0 67.0      Capillary Blood Glucose: No results found for: GLUCAP   Pulmonary Assessment Scores: Pulmonary Assessment Scores    Row Name 11/16/18 1602         ADL UCSD   ADL Phase  Entry     SOB Score total  3       CAT Score   CAT Score  Pre - 12        Pulmonary Function Assessment:   Exercise Target Goals: Exercise Program Goal: Individual exercise prescription set using results from initial 6 min walk test and THRR while considering  patients activity barriers and safety.   Exercise Prescription Goal: Initial exercise prescription builds to 30-45 minutes a day of aerobic activity, 2-3 days per week.  Home exercise guidelines will be given to patient during program as part  of exercise prescription that the participant will acknowledge.  Activity Barriers & Risk Stratification: Activity Barriers & Cardiac Risk Stratification - 11/16/18 1422      Activity Barriers & Cardiac Risk Stratification   Activity Barriers  Left Hip Replacement;Right Hip Replacement;Shortness of Breath;Arthritis    Cardiac Risk Stratification  Moderate       6 Minute Walk: 6 Minute Walk    Row Name 11/19/18 1309 01/19/19 0851       6 Minute Walk   Phase  Initial  Mid Program    Distance  1675 feet  1400 feet    Walk Time  6 minutes  6 minutes    # of Rest Breaks  0  0    MPH  3.17  --    METS  3.45  --    RPE  10  11    Perceived Dyspnea   1  1    Symptoms  No  Yes (comment)    Comments  --  Pt has been desaturating while walking on treadmill at incline. This test was to possibly qualify pt for home oxygen. On flat level ground, pt stays saturating. For the purpose of our program, she will only wear oxygen while walking on an incline on the treadmill.    Resting HR  101 bpm  112 bpm    Resting BP  116/70  122/70    Resting Oxygen Saturation   98 %  95 %    Exercise Oxygen Saturation  during 6 min walk  86 %  89 %    Max Ex. HR  127 bpm  127 bpm    Max Ex. BP  150/80  127/72    2 Minute Post BP  116/76  --      Interval HR   1 Minute HR  104  112    2 Minute HR  112  121    3 Minute HR  117  126    4 Minute HR  120  126    5 Minute HR  123  127    6 Minute HR  127  126    2 Minute Post HR  116  --    Interval Heart Rate?  Yes  Yes      Interval Oxygen   Interval Oxygen?  Yes  Yes    Baseline Oxygen Saturation %  98 %  95 %    1 Minute Oxygen Saturation %  97 %  96 %    1 Minute Liters of Oxygen  0 L  0 L    2 Minute Oxygen Saturation %  92 %  94 %    2 Minute Liters of Oxygen  0 L  0 L    3 Minute Oxygen Saturation %  89 %  90 %    3 Minute Liters of Oxygen  0 L  0 L    4 Minute Oxygen Saturation %  88 %  90 %    4 Minute Liters of Oxygen  0 L  0 L    5  Minute Oxygen Saturation %  86 %  89 %    5 Minute Liters of Oxygen  0 L  0 L    6 Minute Oxygen Saturation %  86 %  90 %    6 Minute Liters of Oxygen  0 L  0 L    2 Minute Post Oxygen Saturation %  99 %  --    2 Minute Post Liters of Oxygen  0 L  --       Oxygen Initial Assessment: Oxygen Initial Assessment - 11/16/18 1425      Home Oxygen   Home Oxygen Device  None    Sleep Oxygen Prescription  None    Home Exercise Oxygen Prescription  None    Home at Rest Exercise Oxygen Prescription  None      Initial 6 min Walk   Oxygen Used  None      Program Oxygen Prescription   Program Oxygen Prescription  None      Intervention   Short Term Goals  To learn and exhibit compliance with exercise, home and travel O2 prescription;To learn and understand importance of maintaining oxygen saturations>88%;To learn and demonstrate proper use of respiratory medications;To learn and understand importance of monitoring SPO2 with pulse oximeter and demonstrate accurate use of the pulse oximeter.;To learn and demonstrate proper pursed lip breathing techniques or other breathing techniques.    Long  Term Goals  Exhibits compliance with exercise, home and travel O2 prescription;Verbalizes importance of monitoring SPO2 with pulse oximeter and return demonstration;Maintenance of O2 saturations>88%;Exhibits proper breathing techniques, such as pursed lip breathing or other method taught during program session;Compliance with respiratory medication;Demonstrates proper use of MDIs       Oxygen Re-Evaluation: Oxygen Re-Evaluation    Row Name 11/19/18 1307 12/15/18 2119 01/12/19 0829 02/01/19 0940       Program Oxygen Prescription   Program Oxygen Prescription  None O2 sat dropped to 86 during 61mt. Will need to monitor closely on room air  None  Continuous  Continuous    Liters per minute  --  --  2  2    Comments  --  --  Pt desaturated on RA on the Treadmill during her visit. Saturations were  maintained on 2L. When pt returns to exercise, we will do another walk test to qualifiy her for home oxygen.   only while walking on treadmill      Home Oxygen   Home Oxygen Device  None  None  None  None    Sleep Oxygen Prescription  None  None  None  None    Home Exercise Oxygen Prescription  None  None  None  None    Home at Rest Exercise Oxygen Prescription  None  None  None  None      Goals/Expected Outcomes   Short Term Goals  To  learn and exhibit compliance with exercise, home and travel O2 prescription;To learn and understand importance of maintaining oxygen saturations>88%;To learn and demonstrate proper use of respiratory medications;To learn and understand importance of monitoring SPO2 with pulse oximeter and demonstrate accurate use of the pulse oximeter.;To learn and demonstrate proper pursed lip breathing techniques or other breathing techniques.  To learn and exhibit compliance with exercise, home and travel O2 prescription;To learn and understand importance of maintaining oxygen saturations>88%;To learn and demonstrate proper use of respiratory medications;To learn and understand importance of monitoring SPO2 with pulse oximeter and demonstrate accurate use of the pulse oximeter.;To learn and demonstrate proper pursed lip breathing techniques or other breathing techniques.  To learn and exhibit compliance with exercise, home and travel O2 prescription;To learn and understand importance of maintaining oxygen saturations>88%;To learn and demonstrate proper use of respiratory medications;To learn and understand importance of monitoring SPO2 with pulse oximeter and demonstrate accurate use of the pulse oximeter.;To learn and demonstrate proper pursed lip breathing techniques or other breathing techniques.  To learn and exhibit compliance with exercise, home and travel O2 prescription;To learn and understand importance of maintaining oxygen saturations>88%;To learn and demonstrate proper use  of respiratory medications;To learn and understand importance of monitoring SPO2 with pulse oximeter and demonstrate accurate use of the pulse oximeter.;To learn and demonstrate proper pursed lip breathing techniques or other breathing techniques.    Long  Term Goals  Exhibits compliance with exercise, home and travel O2 prescription;Verbalizes importance of monitoring SPO2 with pulse oximeter and return demonstration;Maintenance of O2 saturations>88%;Exhibits proper breathing techniques, such as pursed lip breathing or other method taught during program session;Compliance with respiratory medication;Demonstrates proper use of MDIs  Exhibits compliance with exercise, home and travel O2 prescription;Verbalizes importance of monitoring SPO2 with pulse oximeter and return demonstration;Maintenance of O2 saturations>88%;Exhibits proper breathing techniques, such as pursed lip breathing or other method taught during program session;Compliance with respiratory medication;Demonstrates proper use of MDIs  Exhibits compliance with exercise, home and travel O2 prescription;Verbalizes importance of monitoring SPO2 with pulse oximeter and return demonstration;Maintenance of O2 saturations>88%;Exhibits proper breathing techniques, such as pursed lip breathing or other method taught during program session;Compliance with respiratory medication;Demonstrates proper use of MDIs  Exhibits compliance with exercise, home and travel O2 prescription;Verbalizes importance of monitoring SPO2 with pulse oximeter and return demonstration;Maintenance of O2 saturations>88%;Exhibits proper breathing techniques, such as pursed lip breathing or other method taught during program session;Compliance with respiratory medication;Demonstrates proper use of MDIs    Goals/Expected Outcomes  compliance  compliance  compliance  compliance       Oxygen Discharge (Final Oxygen Re-Evaluation): Oxygen Re-Evaluation - 02/01/19 0940       Program Oxygen Prescription   Program Oxygen Prescription  Continuous    Liters per minute  2    Comments  only while walking on treadmill      Home Oxygen   Home Oxygen Device  None    Sleep Oxygen Prescription  None    Home Exercise Oxygen Prescription  None    Home at Rest Exercise Oxygen Prescription  None      Goals/Expected Outcomes   Short Term Goals  To learn and exhibit compliance with exercise, home and travel O2 prescription;To learn and understand importance of maintaining oxygen saturations>88%;To learn and demonstrate proper use of respiratory medications;To learn and understand importance of monitoring SPO2 with pulse oximeter and demonstrate accurate use of the pulse oximeter.;To learn and demonstrate proper pursed lip breathing techniques or other breathing techniques.  Long  Term Goals  Exhibits compliance with exercise, home and travel O2 prescription;Verbalizes importance of monitoring SPO2 with pulse oximeter and return demonstration;Maintenance of O2 saturations>88%;Exhibits proper breathing techniques, such as pursed lip breathing or other method taught during program session;Compliance with respiratory medication;Demonstrates proper use of MDIs    Goals/Expected Outcomes  compliance       Initial Exercise Prescription: Initial Exercise Prescription - 11/19/18 1300      Date of Initial Exercise RX and Referring Provider   Date  11/19/18    Referring Provider  Dr. Haroldine Laws      Treadmill   MPH  2    Grade  2    Minutes  17      Bike   Level  0.5    Minutes  17      Recumbant Bike   Level  2    Minutes  17      Prescription Details   Frequency (times per week)  2    Duration  Progress to 45 minutes of aerobic exercise without signs/symptoms of physical distress      Intensity   THRR 40-80% of Max Heartrate  62-123    Ratings of Perceived Exertion  11-13    Perceived Dyspnea  0-4      Progression   Progression  Continue to progress workloads  to maintain intensity without signs/symptoms of physical distress.      Resistance Training   Training Prescription  Yes    Weight  orange bands    Reps  10-15       Perform Capillary Blood Glucose checks as needed.  Exercise Prescription Changes: Exercise Prescription Changes    Row Name 12/24/18 1220 01/21/19 1516           Response to Exercise   Blood Pressure (Admit)  106/70  120/86      Blood Pressure (Exercise)  120/74  114/68      Blood Pressure (Exit)  98/70  96/64      Heart Rate (Admit)  97 bpm  94 bpm      Heart Rate (Exercise)  114 bpm  111 bpm      Heart Rate (Exit)  105 bpm  98 bpm      Oxygen Saturation (Admit)  90 %  91 %      Oxygen Saturation (Exercise)  94 %  91 %      Oxygen Saturation (Exit)  99 %  93 %      Rating of Perceived Exertion (Exercise)  7  11      Perceived Dyspnea (Exercise)  0  0      Duration  Progress to 45 minutes of aerobic exercise without signs/symptoms of physical distress  Progress to 45 minutes of aerobic exercise without signs/symptoms of physical distress      Intensity  -- 40-80% HRR  THRR unchanged        Progression   Progression  Continue to progress workloads to maintain intensity without signs/symptoms of physical distress.  Continue to progress workloads to maintain intensity without signs/symptoms of physical distress.        Resistance Training   Training Prescription  Yes  Yes      Weight  orange bands  orange bands      Reps  10-15  10-15      Time  10 Minutes  10 Minutes        Interval Training   Interval Training  No  No        Oxygen   Oxygen  Continuous  Continuous      Liters  2  2        Treadmill   MPH  2  --      Grade  2  --      Minutes  34  --        Bike   Level  --  0.6      Minutes  --  17        Recumbant Bike   Level  --  2      Minutes  --  17         Exercise Comments:   Exercise Goals and Review: Exercise Goals    Row Name 11/16/18 1418             Exercise Goals    Increase Physical Activity  Yes       Intervention  Provide advice, education, support and counseling about physical activity/exercise needs.;Develop an individualized exercise prescription for aerobic and resistive training based on initial evaluation findings, risk stratification, comorbidities and participant's personal goals.       Expected Outcomes  Short Term: Attend rehab on a regular basis to increase amount of physical activity.;Long Term: Exercising regularly at least 3-5 days a week.;Long Term: Add in home exercise to make exercise part of routine and to increase amount of physical activity.       Increase Strength and Stamina  Yes       Intervention  Provide advice, education, support and counseling about physical activity/exercise needs.;Develop an individualized exercise prescription for aerobic and resistive training based on initial evaluation findings, risk stratification, comorbidities and participant's personal goals.       Expected Outcomes  Short Term: Increase workloads from initial exercise prescription for resistance, speed, and METs.;Short Term: Perform resistance training exercises routinely during rehab and add in resistance training at home;Long Term: Improve cardiorespiratory fitness, muscular endurance and strength as measured by increased METs and functional capacity (6MWT)       Able to understand and use rate of perceived exertion (RPE) scale  Yes       Intervention  Provide education and explanation on how to use RPE scale       Expected Outcomes  Short Term: Able to use RPE daily in rehab to express subjective intensity level;Long Term:  Able to use RPE to guide intensity level when exercising independently       Able to understand and use Dyspnea scale  Yes       Intervention  Provide education and explanation on how to use Dyspnea scale       Expected Outcomes  Short Term: Able to use Dyspnea scale daily in rehab to express subjective sense of shortness of breath  during exertion;Long Term: Able to use Dyspnea scale to guide intensity level when exercising independently       Knowledge and understanding of Target Heart Rate Range (THRR)  Yes       Intervention  Provide education and explanation of THRR including how the numbers were predicted and where they are located for reference       Expected Outcomes  Short Term: Able to state/look up THRR;Long Term: Able to use THRR to govern intensity when exercising independently;Short Term: Able to use daily as guideline for intensity in rehab       Understanding of Exercise Prescription  Yes  Intervention  Provide education, explanation, and written materials on patient's individual exercise prescription       Expected Outcomes  Short Term: Able to explain program exercise prescription;Long Term: Able to explain home exercise prescription to exercise independently          Exercise Goals Re-Evaluation : Exercise Goals Re-Evaluation    Row Name 12/15/18 0818 01/12/19 0830 02/01/19 0941         Exercise Goal Re-Evaluation   Exercise Goals Review  Increase Physical Activity;Increase Strength and Stamina;Able to understand and use rate of perceived exertion (RPE) scale;Able to understand and use Dyspnea scale;Knowledge and understanding of Target Heart Rate Range (THRR);Understanding of Exercise Prescription  Increase Physical Activity;Increase Strength and Stamina;Able to understand and use rate of perceived exertion (RPE) scale;Able to understand and use Dyspnea scale;Knowledge and understanding of Target Heart Rate Range (THRR);Understanding of Exercise Prescription  Increase Physical Activity;Increase Strength and Stamina;Able to understand and use rate of perceived exertion (RPE) scale;Able to understand and use Dyspnea scale;Knowledge and understanding of Target Heart Rate Range (THRR);Understanding of Exercise Prescription     Comments  Pt completed her walk test almost a month ago and has yet to start  exercise. Called out for the third week in a row. If pt does not come for exercise next week she will be dropped.   Pt has came to 2 exercise sessions and has now missed 2 weeks in a row. Pt has had continual absences. Pt has already called out today, and if she is absent on Thursday, I will have to discuss with her the possibility of dropping her from the program. If pt returns, I will need to do another 6MWT on her so that she can qualify for oxygen. Pt has only completed 2 exercise stations, but seemed eager to exercise. Will continue to monitor and progress as able.   Pt's attendance had been improving. Pt was exercising at 3.1 METs. Pt now uses oxygen only when walking on the treadmill. Our department is currently closed and will be for an unknown period of time. I tried to call pt on 3/16 to go over home exercise that she can be doing during our layoff. I left a VM, and pt never called me back. Will attempt to follow up with patient later this week about home exercise and any updates after I recieve them.      Expected Outcomes  Through exercise at rehab and at home, the patient will decrease shortness of breath with daily activities and feel confident in carrying out an exercise regime at home.   Through exercise at rehab and at home, the patient will decrease shortness of breath with daily activities and feel confident in carrying out an exercise regime at home.   Through exercise at rehab and at home, the patient will decrease shortness of breath with daily activities and feel confident in carrying out an exercise regime at home.         Discharge Exercise Prescription (Final Exercise Prescription Changes): Exercise Prescription Changes - 01/21/19 1516      Response to Exercise   Blood Pressure (Admit)  120/86    Blood Pressure (Exercise)  114/68    Blood Pressure (Exit)  96/64    Heart Rate (Admit)  94 bpm    Heart Rate (Exercise)  111 bpm    Heart Rate (Exit)  98 bpm    Oxygen Saturation  (Admit)  91 %    Oxygen Saturation (Exercise)  91 %  Oxygen Saturation (Exit)  93 %    Rating of Perceived Exertion (Exercise)  11    Perceived Dyspnea (Exercise)  0    Duration  Progress to 45 minutes of aerobic exercise without signs/symptoms of physical distress    Intensity  THRR unchanged      Progression   Progression  Continue to progress workloads to maintain intensity without signs/symptoms of physical distress.      Resistance Training   Training Prescription  Yes    Weight  orange bands    Reps  10-15    Time  10 Minutes      Interval Training   Interval Training  No      Oxygen   Oxygen  Continuous    Liters  2      Bike   Level  0.6    Minutes  17      Recumbant Bike   Level  2    Minutes  17       Nutrition:  Target Goals: Understanding of nutrition guidelines, daily intake of sodium <1544m, cholesterol <2078m calories 30% from fat and 7% or less from saturated fats, daily to have 5 or more servings of fruits and vegetables.  Biometrics:    Nutrition Therapy Plan and Nutrition Goals: Nutrition Therapy & Goals - 12/22/18 1213      Nutrition Therapy   Diet  heart healthy, carb modified      Personal Nutrition Goals   Nutrition Goal  Pt to identify and limit food sources of sodium,saturated fat, trans fats and refined carbohydrates    Personal Goal #2  The pt will recognize symptoms that can interfere with adequate oral intake    Personal Goal #3  Identify food quantities necessary to achieve wt loss of  -2# per week to a goal wt loss of 5 lb at graduation from pulmonary rehab    Personal Goal #4  Pt able to name foods that affect blood glucose       Intervention Plan   Intervention  Prescribe, educate and counsel regarding individualized specific dietary modifications aiming towards targeted core components such as weight, hypertension, lipid management, diabetes, heart failure and other comorbidities.    Expected Outcomes  Short Term Goal:  Understand basic principles of dietary content, such as calories, fat, sodium, cholesterol and nutrients.;Long Term Goal: Adherence to prescribed nutrition plan.       Nutrition Assessments: Nutrition Assessments - 12/03/18 1417      Rate Your Plate Scores   Pre Score  38       Nutrition Goals Re-Evaluation: Nutrition Goals Re-Evaluation    Row Name 12/03/18 1416             Goals   Current Weight  150 lb 5.7 oz (68.2 kg)          Nutrition Goals Discharge (Final Nutrition Goals Re-Evaluation): Nutrition Goals Re-Evaluation - 12/03/18 1416      Goals   Current Weight  150 lb 5.7 oz (68.2 kg)       Psychosocial: Target Goals: Acknowledge presence or absence of significant depression and/or stress, maximize coping skills, provide positive support system. Participant is able to verbalize types and ability to use techniques and skills needed for reducing stress and depression.  Initial Review & Psychosocial Screening: Initial Psych Review & Screening - 11/16/18 1443      Initial Review   Current issues with  History of Depression      Family Dynamics  Good Support System?  Yes    Comments  husband and father who is 57 and in good health      Barriers   Psychosocial barriers to participate in program  There are no identifiable barriers or psychosocial needs.;The patient should benefit from training in stress management and relaxation.      Screening Interventions   Interventions  Encouraged to exercise    Expected Outcomes  Long Term goal: The participant improves quality of Life and PHQ9 Scores as seen by post scores and/or verbalization of changes;Short Term goal: Identification and review with participant of any Quality of Life or Depression concerns found by scoring the questionnaire.       Quality of Life Scores:  Scores of 19 and below usually indicate a poorer quality of life in these areas.  A difference of  2-3 points is a clinically meaningful  difference.  A difference of 2-3 points in the total score of the Quality of Life Index has been associated with significant improvement in overall quality of life, self-image, physical symptoms, and general health in studies assessing change in quality of life.  PHQ-9: Recent Review Flowsheet Data    Depression screen [facility] 2/9 11/16/2018 11/20/2017 06/09/2013   Decreased Interest 1 0 0   Down, Depressed, Hopeless 1 0 0   PHQ - 2 Score 2 0 0   Altered sleeping 0  - -   Tired, decreased energy 1 - -   Change in appetite 0 - -   Feeling bad or failure about yourself  1  - -   Trouble concentrating 0 - -   Moving slowly or fidgety/restless 0 - -   Suicidal thoughts 0 - -   PHQ-9 Score 4 - -   Difficult doing work/chores Not difficult at all - -     Interpretation of Total Score  Total Score Depression Severity:  1-4 = Minimal depression, 5-9 = Mild depression, 10-14 = Moderate depression, 15-19 = Moderately severe depression, 20-27 = Severe depression   Psychosocial Evaluation and Intervention: Psychosocial Evaluation - 01/12/19 0830      Psychosocial Evaluation & Interventions   Interventions  Stress management education;Relaxation education;Encouraged to exercise with the program and follow exercise prescription    Comments  Pt is pleasant when she attends rehab. Pt has had to take care of her father and some illness that has hindered her to participate in rehab.    Expected Outcomes  Pt will report having decrease in frequency of blue days. Pt will also more consistantly attend.    Continue Psychosocial Services   Follow up required by staff       Psychosocial Re-Evaluation: Psychosocial Re-Evaluation    [place] Name 12/15/18 1633 02/01/19 1027 02/01/19 1048         Psychosocial Re-Evaluation   Current issues with  History of Depression  History of Depression  (Pended)   --     Comments  Pt admits she does have "blue" days but she takes medication which helps.  Pt admits she does  have "blue" days but she takes medication which helps.  (Pended)   Pt admits she does have "blue" days but she takes medication which helps. Pt seems to enjoy participating in rehab and is always very pleasant when attending. Pt is socialable with other rehab participants and staff.  Rehab is currently closed and will plan to re-evaulate pt progress upon reopening.      Expected Outcomes  Pt will report a  decrease in blue days  --  Pt will report a decrease in blue days     Continue Psychosocial Services   Follow up required by staff  --  Follow up required by staff        Psychosocial Discharge (Final Psychosocial Re-Evaluation): Psychosocial Re-Evaluation - 02/01/19 1048      Psychosocial Re-Evaluation   Comments  Pt admits she does have "blue" days but she takes medication which helps. Pt seems to enjoy participating in rehab and is always very pleasant when attending. Pt is socialable with other rehab participants and staff.  Rehab is currently closed and will plan to re-evaulate pt progress upon reopening.     Expected Outcomes  Pt will report a decrease in blue days    Continue Psychosocial Services   Follow up required by staff       Education: Education Goals: Education classes will be provided on a weekly basis, covering required topics. Participant will state understanding/return demonstration of topics presented.  Learning Barriers/Preferences: Learning Barriers/Preferences - 11/16/18 1445      Learning Barriers/Preferences   Learning Barriers  Sight    Learning Preferences  Group Instruction;Skilled Demonstration;Individual Instruction;Verbal Instruction       Education Topics: Risk Factor Reduction:  -Group instruction that is supported by a PowerPoint presentation. Instructor discusses the definition of a risk factor, different risk factors for pulmonary disease, and how the heart and lungs work together.     Nutrition for Pulmonary Patient:  -Group instruction  provided by PowerPoint slides, verbal discussion, and written materials to support subject matter. The instructor gives an explanation and review of healthy diet recommendations, which includes a discussion on weight management, recommendations for fruit and vegetable consumption, as well as protein, fluid, caffeine, fiber, sodium, sugar, and alcohol. Tips for eating when patients are short of breath are discussed.   Pursed Lip Breathing:  -Group instruction that is supported by demonstration and informational handouts. Instructor discusses the benefits of pursed lip and diaphragmatic breathing and detailed demonstration on how to preform both.     Oxygen Safety:  -Group instruction provided by PowerPoint, verbal discussion, and written material to support subject matter. There is an overview of What is Oxygen and Why do we need it.  Instructor also reviews how to create a safe environment for oxygen use, the importance of using oxygen as prescribed, and the risks of noncompliance. There is a brief discussion on traveling with oxygen and resources the patient may utilize.   Oxygen Equipment:  -Group instruction provided by [facility] Staff utilizing handouts, written materials, and equipment demonstrations.   Signs and Symptoms:  -Group instruction provided by written material and verbal discussion to support subject matter. Warning signs and symptoms of infection, stroke, and heart attack are reviewed and when to call the physician/911 reinforced. Tips for preventing the spread of infection discussed.   Advanced Directives:  -Group instruction provided by verbal instruction and written material to support subject matter. Instructor reviews Advanced Directive laws and proper instruction for filling out document.   Pulmonary Video:  -Group video education that reviews the importance of medication and oxygen compliance, exercise, good nutrition, pulmonary hygiene, and pursed lip and  diaphragmatic breathing for the pulmonary patient.   Exercise for the Pulmonary Patient:  -Group instruction that is supported by a PowerPoint presentation. Instructor discusses benefits of exercise, core components of exercise, frequency, duration, and intensity of an exercise routine, importance of utilizing pulse oximetry during exercise, safety while exercising, and  options of places to exercise outside of rehab.     Pulmonary Medications:  -Verbally interactive group education provided by instructor with focus on inhaled medications and proper administration.   Anatomy and Physiology of the Respiratory System and Intimacy:  -Group instruction provided by PowerPoint, verbal discussion, and written material to support subject matter. Instructor reviews respiratory cycle and anatomical components of the respiratory system and their functions. Instructor also reviews differences in obstructive and restrictive respiratory diseases with examples of each. Intimacy, Sex, and Sexuality differences are reviewed with a discussion on how relationships can change when diagnosed with pulmonary disease. Common sexual concerns are reviewed.   MD DAY -A group question and answer session with a medical doctor that allows participants to ask questions that relate to their pulmonary disease state.   OTHER EDUCATION -Group or individual verbal, written, or video instructions that support the educational goals of the pulmonary rehab program.   PULMONARY REHAB OTHER RESPIRATORY from 01/21/2019 in [place]  Date  12/24/18 [[facility]]  Educator  Iona Beard  Instruction Review Code  1- Verbalizes Understanding      Holiday Eating Survival Tips:  -Group instruction provided by [employer], verbal discussion, and written materials to support subject matter. The instructor gives patients tips, tricks, and techniques to help them not only survive but enjoy the holidays despite  the onslaught of food that accompanies the holidays.   Knowledge Questionnaire Score: Knowledge Questionnaire Score - 11/16/18 1447      Knowledge Questionnaire Score   Pre Score  17/18       Core Components/Risk Factors/Patient Goals at Admission: Personal Goals and Risk Factors at Admission - 11/16/18 1448      Core Components/Risk Factors/Patient Goals on Admission    Weight Management  Yes;Weight Loss    Intervention  Weight Management/Obesity: Establish reasonable short term and long term weight goals.;Weight Management: Provide education and appropriate resources to help participant work on and attain dietary goals.;Weight Management: Develop a combined nutrition and exercise program designed to reach desired caloric intake, while maintaining appropriate intake of nutrient and fiber, sodium and fats, and appropriate energy expenditure required for the weight goal.    Admit Weight  150 lb 5.7 oz (68.2 kg)    Goal Weight: Short Term  145 lb (65.8 kg)    Goal Weight: Long Term  140 lb (63.5 kg)    Expected Outcomes  Short Term: Continue to assess and modify interventions until short term weight is achieved;Long Term: Adherence to nutrition and physical activity/exercise program aimed toward attainment of established weight goal;Weight Loss: Understanding of general recommendations for a balanced deficit meal plan, which promotes 1-2 lb weight loss per week and includes a negative energy balance of [phone] kcal/d;Understanding recommendations for meals to include 15-35% energy as protein, 25-35% energy from fat, 35-60% energy from carbohydrates, less than 265m of dietary cholesterol, 20-35 gm of total fiber daily;Understanding of distribution of calorie intake throughout the day with the consumption of 4-5 meals/snacks    Improve shortness of breath with ADL's  Yes    Intervention  Provide education, individualized exercise plan and daily activity instruction to help decrease symptoms of  SOB with activities of daily living.    Expected Outcomes  Short Term: Improve cardiorespiratory fitness to achieve a reduction of symptoms when performing ADLs;Long Term: Be able to perform more ADLs without symptoms or delay the onset of symptoms       Core Components/Risk Factors/Patient Goals Review:  Goals and Risk Factor Review    [place] Name 12/15/18 1634 01/12/19 0832 02/01/19 1051         Core Components/Risk Factors/Patient Goals Review   Personal Goals Review  Weight Management/Obesity;Improve shortness of breath with ADL's;Increase knowledge of respiratory medications and ability to use respiratory devices properly.;Develop more efficient breathing techniques such as purse lipped breathing and diaphragmatic breathing and practicing self-pacing with activity.  Weight Management/Obesity;Improve shortness of breath with ADL's;Increase knowledge of respiratory medications and ability to use respiratory devices properly.;Develop more efficient breathing techniques such as purse lipped breathing and diaphragmatic breathing and practicing self-pacing with activity.  Weight Management/Obesity;Improve shortness of breath with ADL's;Increase knowledge of respiratory medications and ability to use respiratory devices properly.;Develop more efficient breathing techniques such as purse lipped breathing and diaphragmatic breathing and practicing self-pacing with activity.     Review  Pt has not come to pulmonary rehab exercise yet. Pt was to start on 11/26/18, but has called multiple times to push back her start date  Pt has completed 2 exercise sessions and 1 education class. Pt attendance has been inconsistent and she has called out of rehab for multiple weeks d/t illness of her father and her self as well as some dental work that she has had done. She was able to complete exercise with workloads of 2.0/2.0 on the treadmill which she progressed to from the track quickly. Pt also completed workloads of 0.4  on the airdyne and level 2 on the recumbent bike. Will continue to monitor pt for progress in next 30 day review..  Pt has completed 5 exercise sessions and 3 education class. Attendance has been more consistent. She was able to complete exercise with workloads of 2.0/2.0 on the treadmill,increased to 0.6 on the airdyne and level 2 on the recumbent bike. Rehab is currently closed and will plan to re-evaulate pt progress upon reopening     Expected Outcomes  Pt will start rehab on 2/11, which is that last date she called to state she should be starting.  see admission goals and outcomes  see admission goals and outcomes        Core Components/Risk Factors/Patient Goals at Discharge (Final Review):  Goals and Risk Factor Review - 02/01/19 1051      Core Components/Risk Factors/Patient Goals Review   Personal Goals Review  Weight Management/Obesity;Improve shortness of breath with ADL's;Increase knowledge of respiratory medications and ability to use respiratory devices properly.;Develop more efficient breathing techniques such as purse lipped breathing and diaphragmatic breathing and practicing self-pacing with activity.    Review  Pt has completed 5 exercise sessions and 3 education class. Attendance has been more consistent. She was able to complete exercise with workloads of 2.0/2.0 on the treadmill,increased to 0.6 on the airdyne and level 2 on the recumbent bike. Rehab is currently closed and will plan to re-evaulate pt progress upon reopening    Expected Outcomes  see admission goals and outcomes       ITP Comments: ITP Comments    Row Name 12/15/18 1632 01/12/19 0830 02/01/19 1026       ITP Comments  Dr. Jennet Maduro, Medical Director, Pulmonary rehab  Dr. Jennet Maduro, Medical Director, Pulmonary rehab  Dr. Jennet Maduro, Medical Director, Pulmonary rehab        Comments: ITP REVIEW Pt is making expected progress toward personal goals after completing 5 sessions. .  Rehab is currently  closed and will plan to re-evaulate pt progress upon reopening.  Joycelyn Man RN,  BSN Cardiac and Pulmonary Rehab RN

## 2019-02-02 ENCOUNTER — Ambulatory Visit (HOSPITAL_COMMUNITY): Payer: PPO

## 2019-02-02 ENCOUNTER — Telehealth (HOSPITAL_COMMUNITY): Payer: Self-pay

## 2019-02-02 ENCOUNTER — Encounter (HOSPITAL_COMMUNITY): Payer: PPO

## 2019-02-04 ENCOUNTER — Ambulatory Visit (HOSPITAL_COMMUNITY): Payer: PPO

## 2019-02-04 ENCOUNTER — Encounter (HOSPITAL_COMMUNITY): Payer: PPO

## 2019-02-09 ENCOUNTER — Encounter (HOSPITAL_COMMUNITY): Payer: PPO

## 2019-02-09 ENCOUNTER — Ambulatory Visit (HOSPITAL_COMMUNITY): Payer: PPO

## 2019-02-11 ENCOUNTER — Encounter (HOSPITAL_COMMUNITY): Payer: PPO

## 2019-02-11 ENCOUNTER — Ambulatory Visit (HOSPITAL_COMMUNITY): Payer: PPO

## 2019-02-16 ENCOUNTER — Encounter (HOSPITAL_COMMUNITY): Payer: PPO

## 2019-02-18 ENCOUNTER — Encounter (HOSPITAL_COMMUNITY): Payer: PPO

## 2019-02-23 ENCOUNTER — Encounter (HOSPITAL_COMMUNITY): Payer: PPO

## 2019-02-23 ENCOUNTER — Telehealth (HOSPITAL_COMMUNITY): Payer: Self-pay | Admitting: *Deleted

## 2019-02-25 ENCOUNTER — Encounter (HOSPITAL_COMMUNITY): Payer: PPO

## 2019-02-26 ENCOUNTER — Telehealth: Payer: Self-pay | Admitting: Internal Medicine

## 2019-02-26 NOTE — Telephone Encounter (Signed)
Left mess for patient to call back. Needs virtual visit with PCP. Last OV with PCP was 11/2017.

## 2019-03-01 NOTE — Progress Notes (Signed)
Virtual Visit via Video Note  I connected with Danielle Pratt on 03/01/19 at 10:00 AM EDT by a video enabled telemedicine application and verified that I am speaking with the correct person using two identifiers.   I discussed the limitations of evaluation and management by telemedicine and the availability of in person appointments. The patient expressed understanding and agreed to proceed.  The patient is currently at home and I am in the office.    No referring provider.    History of Present Illness: She is here for follow up of her chronic medical conditions.   Anxiety: She is taking her medication daily as prescribed. She denies any side effects from the medication. She feels her anxiety is well controlled and she is happy with her current dose of medication.   Hypothyroidism:  She is taking her medication daily.  She denies any recent changes in energy or weight that are unexplained.  She feels like her thyroid medication dose is good.  Chronic pulmonary thromboembolic disease, pulmonary hypertension, h/o RLE DVT:  She is taking Eliquis daily.  In June she had a pulmonary endarterectomy at [facility].  She is no longer on any oxygen therapy.  She did start pulmonary rehab 2 months ago, but was not able to continue due to the coronavirus situation.  She does get short of breath and her oxygen level dropped when she was walking on an incline.  She is able to do all of her household and around the house/yard work activities.  She typically only gets short of breath with walking up an incline.  She does not have any wheezing, fevers or chills.  She has an occasional dry cough that is likely from her lung surgery.  She has no other concerns.  Medications reviewed and updated.  Refill sent into pharmacy.   Review of Systems  Constitutional: Negative for chills and fever.  Respiratory: Positive for shortness of breath (with incline only). Negative for cough and wheezing.   Cardiovascular:  Negative for chest pain, palpitations and leg swelling.  Neurological: Negative for headaches.     Social History   Socioeconomic History  . Marital status: Married    Spouse name: Not on file  . Number of children: Not on file  . Years of education: Not on file  . Highest education level: Not on file  Occupational History  . Not on file  Social Needs  . Financial resource strain: Not on file  . Food insecurity:    Worry: Not on file    Inability: Not on file  . Transportation needs:    Medical: Not on file    Non-medical: Not on file  Tobacco Use  . Smoking status: Never Smoker  . Smokeless tobacco: Never Used  Substance and Sexual Activity  . Alcohol use: Yes    Alcohol/week: 3.0 standard drinks    Types: 3 Cans of beer per week  . Drug use: No  . Sexual activity: Yes  Lifestyle  . Physical activity:    Days per week: Not on file    Minutes per session: Not on file  . Stress: Not on file  Relationships  . Social connections:    Talks on phone: Not on file    Gets together: Not on file    Attends religious service: Not on file    Active member of club or organization: Not on file    Attends meetings of clubs or organizations: Not on file  Relationship status: Not on file  Other Topics Concern  . Not on file  Social History Narrative  . Not on file     Observations/Objective: Appears well in NAD No shortness of breath. Normal affect and mood  At home vitals: Temperature 96.3, blood pressure 105/80, heart rate 107, oxygenation 95% on room air   Lab Results  Component Value Date   WBC 5.0 10/22/2018   HGB 12.8 10/22/2018   HCT 40.6 10/22/2018   PLT 238 10/22/2018   GLUCOSE 99 10/22/2018   CHOL 215 (H) 07/28/2015   TRIG 85.0 07/28/2015   HDL 82.90 07/28/2015   LDLDIRECT 161.1 06/09/2013   LDLCALC 115 (H) 07/28/2015   ALT 14 12/29/2017   AST 20 12/29/2017   NA 140 10/22/2018   K 4.4 10/22/2018   CL 103 10/22/2018   CREATININE 0.96 10/22/2018    BUN 18 10/22/2018   CO2 26 10/22/2018   TSH 3.42 02/17/2018   INR 1.15 02/25/2018   HGBA1C 5.6 02/01/2016    Assessment and Plan:  See Problem List for Assessment and Plan of chronic medical problems.   Follow Up Instructions:    I discussed the assessment and treatment plan with the patient. The patient was provided an opportunity to ask questions and all were answered. The patient agreed with the plan and demonstrated an understanding of the instructions.   The patient was advised to call back or seek an in-person evaluation if the symptoms worsen or if the condition fails to improve as anticipated.  Follow-up with me in the office in 1 year.  Advised she should get blood work over the summer to recheck her thyroid and get basic blood work,  she agrees  Binnie Rail, MD

## 2019-03-01 NOTE — Telephone Encounter (Signed)
Appointment made for tomorrow

## 2019-03-01 NOTE — Telephone Encounter (Signed)
Pt returning call to [facility]. Attempted to contact the office 3x please follow up with pt directly regarding a virtual visit with pcp to refill medications.

## 2019-03-02 ENCOUNTER — Encounter: Payer: Self-pay | Admitting: Internal Medicine

## 2019-03-02 ENCOUNTER — Encounter (HOSPITAL_COMMUNITY): Payer: PPO

## 2019-03-02 ENCOUNTER — Ambulatory Visit (INDEPENDENT_AMBULATORY_CARE_PROVIDER_SITE_OTHER): Payer: PPO | Admitting: Internal Medicine

## 2019-03-02 DIAGNOSIS — E039 Hypothyroidism, unspecified: Secondary | ICD-10-CM

## 2019-03-02 DIAGNOSIS — I2721 Secondary pulmonary arterial hypertension: Secondary | ICD-10-CM

## 2019-03-02 DIAGNOSIS — F419 Anxiety disorder, unspecified: Secondary | ICD-10-CM | POA: Diagnosis not present

## 2019-03-02 MED ORDER — LEVOTHYROXINE SODIUM 50 MCG PO TABS: ORAL_TABLET | ORAL | 3 refills | Status: DC

## 2019-03-02 MED ORDER — VENLAFAXINE HCL ER 75 MG PO CP24: ORAL_CAPSULE | ORAL | 3 refills | Status: DC

## 2019-03-02 NOTE — Assessment & Plan Note (Signed)
Following with Dr. Haroldine Laws Taking Eliquis twice daily as prescribed without side effects Had started pulmonary rehab in February, but had to stop due to coronavirus situation She is very active at home Shortness of breath with incline only Oxygenation at home this morning 95% on room air

## 2019-03-02 NOTE — Assessment & Plan Note (Signed)
Clinically euthyroid Will check TSH over the summer when the coronavirus situation has resolved Will continue current dose of levothyroxine

## 2019-03-02 NOTE — Assessment & Plan Note (Signed)
Controlled, stable Continue current dose of medication-Effexor 75 mg daily 

## 2019-03-04 ENCOUNTER — Encounter (HOSPITAL_COMMUNITY): Payer: PPO

## 2019-03-09 ENCOUNTER — Encounter (HOSPITAL_COMMUNITY): Payer: PPO

## 2019-03-11 ENCOUNTER — Encounter (HOSPITAL_COMMUNITY): Payer: PPO

## 2019-03-16 ENCOUNTER — Encounter (HOSPITAL_COMMUNITY): Payer: PPO

## 2019-03-24 ENCOUNTER — Telehealth (HOSPITAL_COMMUNITY): Payer: Self-pay | Admitting: *Deleted

## 2019-05-10 DIAGNOSIS — L820 Inflamed seborrheic keratosis: Secondary | ICD-10-CM | POA: Diagnosis not present

## 2019-05-10 DIAGNOSIS — D2239 Melanocytic nevi of other parts of face: Secondary | ICD-10-CM | POA: Diagnosis not present

## 2019-05-10 DIAGNOSIS — D1801 Hemangioma of skin and subcutaneous tissue: Secondary | ICD-10-CM | POA: Diagnosis not present

## 2019-05-10 DIAGNOSIS — L814 Other melanin hyperpigmentation: Secondary | ICD-10-CM | POA: Diagnosis not present

## 2019-05-10 DIAGNOSIS — L720 Epidermal cyst: Secondary | ICD-10-CM | POA: Diagnosis not present

## 2019-05-10 DIAGNOSIS — L821 Other seborrheic keratosis: Secondary | ICD-10-CM | POA: Diagnosis not present

## 2019-05-10 DIAGNOSIS — D2272 Melanocytic nevi of left lower limb, including hip: Secondary | ICD-10-CM | POA: Diagnosis not present

## 2019-05-10 DIAGNOSIS — L308 Other specified dermatitis: Secondary | ICD-10-CM | POA: Diagnosis not present

## 2019-05-10 DIAGNOSIS — D225 Melanocytic nevi of trunk: Secondary | ICD-10-CM | POA: Diagnosis not present

## 2019-05-28 ENCOUNTER — Telehealth (HOSPITAL_COMMUNITY): Payer: Self-pay

## 2019-08-17 DIAGNOSIS — L7211 Pilar cyst: Secondary | ICD-10-CM | POA: Diagnosis not present

## 2019-08-17 DIAGNOSIS — D2239 Melanocytic nevi of other parts of face: Secondary | ICD-10-CM | POA: Diagnosis not present

## 2019-08-19 NOTE — Progress Notes (Signed)
Discharge Progress Report  Patient Details  Name: Pennie Shaw MRN: [id number] Date of Birth: 1951-05-08 Referring Provider:     Pulmonary Rehab Walk Test from 11/19/2018 in [place]  Referring Provider  Dr. Haroldine Laws       Number of Visits: 5 Reason for Discharge:  Early Exit:  due to deparment closure with COVID-19 precautions and she did not return when department reopened  Smoking History:  Social History   Tobacco Use  Smoking Status Never Smoker  Smokeless Tobacco Never Used    Diagnosis:  Chronic thromboembolic pulmonary hypertension ([place])  ADL [facility]:   Initial Exercise Prescription:   Discharge Exercise Prescription (Final Exercise Prescription Changes):   Functional Capacity:   Psychological, QOL, Others - Outcomes: PHQ 2/9: Depression screen [facility] 2/9 11/16/2018 11/20/2017 06/09/2013  Decreased Interest 1 0 0  Down, Depressed, Hopeless 1 0 0  PHQ - 2 Score 2 0 0  Altered sleeping 0 - -  Tired, decreased energy 1 - -  Change in appetite 0 - -  Feeling bad or failure about yourself  1 - -  Trouble concentrating 0 - -  Moving slowly or fidgety/restless 0 - -  Suicidal thoughts 0 - -  PHQ-9 Score 4 - -  Difficult doing work/chores Not difficult at all - -    Quality of Life:   Personal Goals: Goals established at orientation with interventions provided to work toward goal.    Personal Goals Discharge:   Exercise Goals and Review:   Exercise Goals Re-Evaluation:   Nutrition & Weight - Outcomes:    Nutrition:   Nutrition Discharge:   Education Questionnaire Score:   Goals reviewed with patient; copy given to patient.

## 2019-08-19 NOTE — Addendum Note (Signed)
Encounter addended by: Lance Morin, RN on: 08/19/2019 4:35 PM  Actions taken: Clinical Note Signed, Episode resolved

## 2019-10-09 ENCOUNTER — Other Ambulatory Visit (HOSPITAL_COMMUNITY): Payer: Self-pay | Admitting: Internal Medicine

## 2019-10-22 ENCOUNTER — Other Ambulatory Visit (INDEPENDENT_AMBULATORY_CARE_PROVIDER_SITE_OTHER): Payer: PPO

## 2019-10-22 ENCOUNTER — Ambulatory Visit (HOSPITAL_COMMUNITY)
Admission: RE | Admit: 2019-10-22 | Discharge: 2019-10-22 | Disposition: A | Discharge status: Home / Self Care | Payer: PPO | Source: Ambulatory Visit | Attending: Cardiovascular Disease | Admitting: Cardiovascular Disease

## 2019-10-22 ENCOUNTER — Encounter: Payer: Self-pay | Admitting: Internal Medicine

## 2019-10-22 ENCOUNTER — Other Ambulatory Visit: Payer: Self-pay

## 2019-10-22 ENCOUNTER — Ambulatory Visit (INDEPENDENT_AMBULATORY_CARE_PROVIDER_SITE_OTHER): Payer: PPO | Admitting: Internal Medicine

## 2019-10-22 VITALS — BP 112/78 | HR 96 | Temp 97.7°F | Ht 69.25 in | Wt 154.0 lb

## 2019-10-22 DIAGNOSIS — I8002 Phlebitis and thrombophlebitis of superficial vessels of left lower extremity: Secondary | ICD-10-CM | POA: Diagnosis not present

## 2019-10-22 DIAGNOSIS — E039 Hypothyroidism, unspecified: Secondary | ICD-10-CM

## 2019-10-22 LAB — TSH: TSH: 8.57 u[IU]/mL — ABNORMAL HIGH (ref 0.35–4.50)

## 2019-10-22 MED ORDER — APIXABAN 5 MG PO TABS: ORAL_TABLET | ORAL | 10 refills | Status: DC

## 2019-10-22 NOTE — Assessment & Plan Note (Signed)
Clinically euthyroid Check tsh  Titrate med dose if needed  

## 2019-10-22 NOTE — Progress Notes (Signed)
Subjective:    Patient ID: Danielle Pratt, female    DOB: 1951/10/11, 68 y.o.   MRN: [id number]  HPI The patient is here for an acute visit.  She hit her leg medial leg in the last two weeks.  She has a bruise there, but denied any other injury.  Last night she noticed a lump on her medial, proximal left lower leg.  It is tender, slightly red and there is bruising that extends distally from it.  She wondered if this was from hitting her leg within the past couple weeks.  She has a known Baker's cyst in the left popliteal area and it feels tight, but she denies any pain there.  She denies tenderness in the calf.  She denies left lower extremity swelling.  She does have history of a DVT, PE and is taking Eliquis.  She has been taking Eliquis daily as prescribed.   Hypothyroidism: She is taking her medication daily.  She is due to have her level rechecked and wondered if she can do that today.    Medications and allergies reviewed with patient and updated if appropriate.  Patient Active Problem List   Diagnosis Date Noted  . Thrombophlebitis of superficial veins of left lower extremity 10/22/2019  . Pain of both elbows 05/15/2018  . Skin abnormality 04/17/2018  . PAH (pulmonary artery hypertension) ([place]) 03/02/2018  . Left leg pain 02/18/2018  . Oropharyngeal dysphagia 01/01/2018  . Hyperphosphatemia 11/14/2017  . Closed fracture of sesamoid bone of foot 06/18/2017  . Postnasal drip 12/02/2016  . Hip dislocation, right ([place]) 02/20/2016  . DVT, RLE 01/31/16 02/20/2016  . Anxiety 02/20/2016  . Pulmonary emboli ([place]) 01/31/2016  . Multiple pigmented nevi 07/28/2015  . Unruptured popliteal cyst 09/07/2013  . HYPERLIPIDEMIA 01/01/2010  . LEFT BUNDLE BRANCH BLOCK 01/01/2010  . OSTEOARTHRITIS 01/01/2010  . Malignant neoplasm of tonsillar fossa ([place]) 12/27/2008  . Hypothyroidism 07/17/2007    Current Outpatient Medications on File Prior to Visit  Medication Sig Dispense  Refill  . clobetasol cream (TEMOVATE) [id number] % Apply 1 application topically at bedtime as needed (for skin irritation/finger).     [name] FLUZONE HIGH-DOSE QUADRIVALENT 0.7 ML SUSY     . levothyroxine (SYNTHROID) 50 MCG tablet TAKE 1 AND 1/2 TABLETS BY MOUTH EVERY MORNING BEFORE BREAKFAST. 132 tablet 3  . pilocarpine (SALAGEN) 5 MG tablet Take 5 mg by mouth See admin instructions. Take 1 tablet (5 mg) by mouth 3 times daily - early morning, midday and bedtime, may take an additional tablet during the day as needed for dry mouth.    . tretinoin (RETIN-A) 0.05 % cream Apply 1 application topically at bedtime.    [name] venlafaxine XR (EFFEXOR-XR) 75 MG 24 hr capsule TAKE ONE CAPSULE BY MOUTH EVERY NIGHT AT BEDTIME 90 capsule 3   No current facility-administered medications on file prior to visit.    Past Medical History:  Diagnosis Date  . Anxiety   . Arthritis    "hands" (11/12/2017)  . Cataract    extraction surgery  . DVT (deep venous thrombosis) ([place]) 01/2016   LLE  . Fasting hyperglycemia    110 in 01/2011;131 in 05/2007  . Hypothyroidism   . Popliteal cyst   . Pulmonary embolism ([place]) 2017; 11/11/2017   "the one in 2017 was due to LLE DVT; don't why I had the one 11/11/2017"  . Renal insufficiency   . Tonsillar cancer ([place]) 1998    Past Surgical History:  Procedure  Laterality Date  . CESAREAN SECTION  1984   G 1 P 2  . COLONOSCOPY  2003   negative; Dr Henrene Pastor  . JOINT REPLACEMENT    . LYMPHADENECTOMY Right 1998   neck  . RIGHT HEART CATH N/A 10/22/2018   Procedure: RIGHT HEART CATH;  Surgeon: Jolaine Artist, MD;  Location: [facility];  Service: Cardiovascular;  Laterality: N/A;  . RIGHT/LEFT HEART CATH AND CORONARY ANGIOGRAPHY N/A 03/02/2018   Procedure: RIGHT/LEFT HEART CATH AND CORONARY ANGIOGRAPHY;  Surgeon: Jolaine Artist, MD;  Location: [facility];  Service: Cardiovascular;  Laterality: N/A;  . TEMPORARY PACEMAKER N/A 03/02/2018   Procedure: TEMPORARY  PACEMAKER;  Surgeon: Jolaine Artist, MD;  Location: [facility];  Service: Cardiovascular;  Laterality: N/A;  . TONSILLECTOMY AND ADENOIDECTOMY Right 1998   R tonsilar malignancy; subsequent LN dissection negative  . TOTAL ABDOMINAL HYSTERECTOMY  1980s   TOTAL ABDOMINAL HYSTERECTOMY W/ BILATERAL SALPINGOOPHORECTOMY [SHX83]; fibroids; Dr. Nori Riis  . TOTAL HIP ARTHROPLASTY Bilateral 2007   Dr Maureen Ralphs, both hips 3 months apart    Social History   Socioeconomic History  . Marital status: Married    Spouse name: Not on file  . Number of children: Not on file  . Years of education: Not on file  . Highest education level: Not on file  Occupational History  . Not on file  Tobacco Use  . Smoking status: Never Smoker  . Smokeless tobacco: Never Used  Substance and Sexual Activity  . Alcohol use: Yes    Alcohol/week: 3.0 standard drinks    Types: 3 Cans of beer per week  . Drug use: No  . Sexual activity: Yes  Other Topics Concern  . Not on file  Social History Narrative  . Not on file   Social Determinants of Health   Financial Resource Strain:   . Difficulty of Paying Living Expenses: Not on file  Food Insecurity:   . Worried About Charity fundraiser in the Last Year: Not on file  . Ran Out of Food in the Last Year: Not on file  Transportation Needs:   . Lack of Transportation (Medical): Not on file  . Lack of Transportation (Non-Medical): Not on file  Physical Activity:   . Days of Exercise per Week: Not on file  . Minutes of Exercise per Session: Not on file  Stress:   . Feeling of Stress : Not on file  Social Connections:   . Frequency of Communication with Friends and Family: Not on file  . Frequency of Social Gatherings with Friends and Family: Not on file  . Attends Religious Services: Not on file  . Active Member of Clubs or Organizations: Not on file  . Attends Archivist Meetings: Not on file  . Marital Status: Not on file    Family History    Problem Relation Age of Onset  . Hyperlipidemia Mother   . Hypertension Mother   . Ovarian cancer Mother   . Aneurysm Mother        ascending & descending aorta  . Heart attack Father 37  . Leukemia Paternal Grandmother   . Lung cancer Paternal Grandfather   . Diabetes Neg Hx   . Stroke Neg Hx   . Colon cancer Neg Hx   . Stomach cancer Neg Hx   . Rectal cancer Neg Hx   . Breast cancer Neg Hx     Review of Systems  Constitutional: Negative  for chills and fever.  Respiratory: Negative for chest tightness and shortness of breath.   Cardiovascular: Negative for chest pain, palpitations and leg swelling.  Skin: Positive for color change. Negative for wound.       Objective:   Vitals:   10/22/19 1007  BP: 112/78  Pulse: 96  Temp: 97.7 F (36.5 C)  SpO2: 97%   BP Readings from Last 3 Encounters:  10/22/19 112/78  01/11/19 110/74  12/30/18 106/70   Wt Readings from Last 3 Encounters:  10/22/19 154 lb 0.6 oz (69.9 kg)  01/28/19 153 lb (69.4 kg)  01/11/19 153 lb 0.6 oz (69.4 kg)   Body mass index is 22.58 kg/m.   Physical Exam Constitutional:      General: She is not in acute distress.    Appearance: Normal appearance. She is not ill-appearing.  HENT:     Head: Normocephalic and atraumatic.  Musculoskeletal:     Comments: Left lower leg on medial, proximal leg grape sized lump that is tender with mild overlying erythema, lump is firm, no fluctuation.  Slight cord extending distally, bruising in area that extends distally.  No calf tenderness, no left lower extremity edema.  Probable superficial thrombophlebitis  Skin:    General: Skin is warm and dry.  Neurological:     General: No focal deficit present.     Mental Status: She is alert.     Sensory: No sensory deficit.            Assessment & Plan:    See Problem List for Assessment and Plan of chronic medical problems.    This visit occurred during the SARS-CoV-2 public health emergency.  Safety  protocols were in place, including screening questions prior to the visit, additional usage of staff PPE, and extensive cleaning of exam room while observing appropriate contact time as indicated for disinfecting solutions.

## 2019-10-22 NOTE — Patient Instructions (Signed)
  Tests ordered today. Your results will be released to [place] (or called to you) after review.  If any changes need to be made, you will be notified at that same time.  An ultrasound of your left leg was ordered.     We will call you with the results of the ultrasound today.

## 2019-10-22 NOTE — Assessment & Plan Note (Signed)
Exam consistent with superficial left lower extremity thrombophlebitis Doubt DVT, but given her history we will go ahead and get an ultrasound She is on Eliquis and has been taking as prescribed Ultrasound today-treatment depending on results

## 2019-10-25 ENCOUNTER — Encounter: Payer: Self-pay | Admitting: Internal Medicine

## 2019-10-25 ENCOUNTER — Ambulatory Visit: Payer: PPO | Admitting: Internal Medicine

## 2019-10-25 DIAGNOSIS — E039 Hypothyroidism, unspecified: Secondary | ICD-10-CM

## 2019-10-26 ENCOUNTER — Other Ambulatory Visit: Payer: Self-pay | Admitting: Obstetrics & Gynecology

## 2019-10-26 DIAGNOSIS — Z1231 Encounter for screening mammogram for malignant neoplasm of breast: Secondary | ICD-10-CM

## 2019-10-26 MED ORDER — LEVOTHYROXINE SODIUM 88 MCG PO TABS: 88.0000 ug | ORAL_TABLET | Freq: Every day | ORAL | 3 refills | Status: DC

## 2019-11-25 ENCOUNTER — Other Ambulatory Visit (HOSPITAL_COMMUNITY): Payer: Self-pay | Admitting: Internal Medicine

## 2019-12-02 ENCOUNTER — Ambulatory Visit: Payer: PPO | Attending: Internal Medicine

## 2019-12-02 DIAGNOSIS — Z23 Encounter for immunization: Secondary | ICD-10-CM | POA: Insufficient documentation

## 2019-12-02 NOTE — Progress Notes (Signed)
   Covid-19 Vaccination Clinic  Name:  Danielle Pratt    MRN: [id number] DOB: Jan 11, 1951  12/02/2019  [name] was observed post Covid-19 immunization for 15 minutes without incidence. She was provided with Vaccine Information Sheet and instruction to access the V-Safe system.   [name] was instructed to call 911 with any severe reactions post vaccine: [name] Difficulty breathing  . Swelling of your face and throat  . A fast heartbeat  . A bad rash all over your body  . Dizziness and weakness    Immunizations Administered    Name Date Dose VIS Date Route   Pfizer COVID-19 Vaccine 12/02/2019  5:16 PM 0.3 mL 10/22/2019 Intramuscular   Manufacturer: [place]   Lot: [id number]   [place]: [id number]

## 2019-12-22 ENCOUNTER — Ambulatory Visit: Payer: PPO

## 2019-12-23 ENCOUNTER — Ambulatory Visit: Payer: PPO | Attending: Internal Medicine

## 2019-12-23 DIAGNOSIS — Z23 Encounter for immunization: Secondary | ICD-10-CM | POA: Insufficient documentation

## 2019-12-23 NOTE — Progress Notes (Signed)
   Covid-19 Vaccination Clinic  Name:  Danielle Pratt    MRN: [id number] DOB: 08/24/1951  12/23/2019  [name] was observed post Covid-19 immunization for 15 minutes without incidence. She was provided with Vaccine Information Sheet and instruction to access the V-Safe system.   [name] was instructed to call 911 with any severe reactions post vaccine: [name] Difficulty breathing  . Swelling of your face and throat  . A fast heartbeat  . A bad rash all over your body  . Dizziness and weakness    Immunizations Administered    Name Date Dose VIS Date Route   Pfizer COVID-19 Vaccine 12/23/2019 10:02 AM 0.3 mL 10/22/2019 Intramuscular   Manufacturer: [employer]   Lot: [id number]   [place]: [id number]

## 2019-12-25 ENCOUNTER — Other Ambulatory Visit (HOSPITAL_COMMUNITY): Payer: Self-pay | Admitting: Internal Medicine

## 2019-12-31 ENCOUNTER — Ambulatory Visit: Payer: PPO

## 2020-01-11 ENCOUNTER — Telehealth: Payer: Self-pay

## 2020-01-11 NOTE — Telephone Encounter (Signed)
Needs an appointment.

## 2020-01-11 NOTE — Telephone Encounter (Signed)
  New message    The patient calls want to know can she come by the office for a stool kit.   The patient is asking for herself something seems off

## 2020-01-12 NOTE — Telephone Encounter (Signed)
Addendum   Call patient left a message on home voicemail per nurse recommendation to call the office to set up an appt.

## 2020-01-22 ENCOUNTER — Other Ambulatory Visit: Payer: Self-pay | Admitting: Internal Medicine

## 2020-01-25 NOTE — Progress Notes (Signed)
Subjective:    Patient ID: Danielle Pratt, female    DOB: 18-Aug-1951, 69 y.o.   MRN: [id number]  HPI The patient is here for an acute visit.  She is having bowel issues.  It started a couple of months ago.  Prior to the change in stool she was having a formed bowel movement every day to 2 days and that was normal for her.  She does not have watery diarrhea.  She is having less formed stool.  It is semiformed or very soft and she is having more frequent bowel movements.  She does not always have the sensation that she has to go the bathroom and has less control of the stool and has not made it to the bathroom.  She is wearing a pad because of that.  The leakage of stool is very concerning to her.  She thinks she has some hemorrhoids because she has had a few specks of blood on the toilet paper when she wipes.  She states that area is irritated because of the more frequent bowel movements.  She has not seen any blood in the stool.  She denies any nausea, rectal pain or abdominal pain.  She denies any changes in medications, supplements or diet that could have caused this.  She does eat a lot of vegetables and there has been no major changes in that.  Medications and allergies reviewed with patient and updated if appropriate.  Patient Active Problem List   Diagnosis Date Noted  . Thrombophlebitis of superficial veins of left lower extremity 10/22/2019  . Pain of both elbows 05/15/2018  . Skin abnormality 04/17/2018  . PAH (pulmonary artery hypertension) ([place]) 03/02/2018  . Left leg pain 02/18/2018  . Oropharyngeal dysphagia 01/01/2018  . Hyperphosphatemia 11/14/2017  . Closed fracture of sesamoid bone of foot 06/18/2017  . Postnasal drip 12/02/2016  . Hip dislocation, right ([place]) 02/20/2016  . DVT, RLE 01/31/16 02/20/2016  . Anxiety 02/20/2016  . Pulmonary emboli ([place]) 01/31/2016  . Multiple pigmented nevi 07/28/2015  . Unruptured popliteal cyst 09/07/2013  . HYPERLIPIDEMIA  01/01/2010  . LEFT BUNDLE BRANCH BLOCK 01/01/2010  . OSTEOARTHRITIS 01/01/2010  . Malignant neoplasm of tonsillar fossa ([place]) 12/27/2008  . Hypothyroidism 07/17/2007    Current Outpatient Medications on File Prior to Visit  Medication Sig Dispense Refill  . apixaban (ELIQUIS) 5 MG TABS tablet Take 1 tablet (5 mg total) by mouth 2 (two) times daily. Last refill without office visit. Please call [phone] 60 tablet 0  . clobetasol cream (TEMOVATE) [id number] % Apply 1 application topically at bedtime as needed (for skin irritation/finger).     [name] FLUZONE HIGH-DOSE QUADRIVALENT 0.7 ML SUSY     . levothyroxine (SYNTHROID) 88 MCG tablet Take 1 tablet (88 mcg total) by mouth daily. 90 tablet 3  . pilocarpine (SALAGEN) 5 MG tablet Take 5 mg by mouth See admin instructions. Take 1 tablet (5 mg) by mouth 3 times daily - early morning, midday and bedtime, may take an additional tablet during the day as needed for dry mouth.    . tretinoin (RETIN-A) 0.05 % cream Apply 1 application topically at bedtime.    [name] venlafaxine XR (EFFEXOR-XR) 75 MG 24 hr capsule TAKE ONE CAPSULE BY MOUTH EVERY NIGHT AT BEDTIME 90 capsule 3   No current facility-administered medications on file prior to visit.    Past Medical History:  Diagnosis Date  . Anxiety   . Arthritis    "hands" (11/12/2017)  .  Cataract    extraction surgery  . DVT (deep venous thrombosis) ([place]) 01/2016   LLE  . Fasting hyperglycemia    110 in 01/2011;131 in 05/2007  . Hypothyroidism   . Popliteal cyst   . Pulmonary embolism ([place]) 2017; 11/11/2017   "the one in 2017 was due to LLE DVT; don't why I had the one 11/11/2017"  . Renal insufficiency   . Tonsillar cancer ([place]) 1998    Past Surgical History:  Procedure Laterality Date  . CESAREAN SECTION  1984   G 1 P 2  . COLONOSCOPY  2003   negative; Dr Henrene Pastor  . JOINT REPLACEMENT    . LYMPHADENECTOMY Right 1998   neck  . RIGHT HEART CATH N/A 10/22/2018   Procedure: RIGHT HEART CATH;  Surgeon:  Jolaine Artist, MD;  Location: [facility];  Service: Cardiovascular;  Laterality: N/A;  . RIGHT/LEFT HEART CATH AND CORONARY ANGIOGRAPHY N/A 03/02/2018   Procedure: RIGHT/LEFT HEART CATH AND CORONARY ANGIOGRAPHY;  Surgeon: Jolaine Artist, MD;  Location: [facility];  Service: Cardiovascular;  Laterality: N/A;  . TEMPORARY PACEMAKER N/A 03/02/2018   Procedure: TEMPORARY PACEMAKER;  Surgeon: Jolaine Artist, MD;  Location: [facility];  Service: Cardiovascular;  Laterality: N/A;  . TONSILLECTOMY AND ADENOIDECTOMY Right 1998   R tonsilar malignancy; subsequent LN dissection negative  . TOTAL ABDOMINAL HYSTERECTOMY  1980s   TOTAL ABDOMINAL HYSTERECTOMY W/ BILATERAL SALPINGOOPHORECTOMY [SHX83]; fibroids; Dr. Nori Riis  . TOTAL HIP ARTHROPLASTY Bilateral 2007   Dr Maureen Ralphs, both hips 3 months apart    Social History   Socioeconomic History  . Marital status: Married    Spouse name: Not on file  . Number of children: Not on file  . Years of education: Not on file  . Highest education level: Not on file  Occupational History  . Not on file  Tobacco Use  . Smoking status: Never Smoker  . Smokeless tobacco: Never Used  Substance and Sexual Activity  . Alcohol use: Yes    Alcohol/week: 3.0 standard drinks    Types: 3 Cans of beer per week  . Drug use: No  . Sexual activity: Yes  Other Topics Concern  . Not on file  Social History Narrative  . Not on file   Social Determinants of Health   Financial Resource Strain:   . Difficulty of Paying Living Expenses:   Food Insecurity:   . Worried About Charity fundraiser in the Last Year:   . Arboriculturist in the Last Year:   Transportation Needs:   . Film/video editor (Medical):   [name] Lack of Transportation (Non-Medical):   Physical Activity:   . Days of Exercise per Week:   . Minutes of Exercise per Session:   Stress:   . Feeling of Stress :   Social Connections:   . Frequency of Communication with  Friends and Family:   . Frequency of Social Gatherings with Friends and Family:   . Attends Religious Services:   . Active Member of Clubs or Organizations:   . Attends Archivist Meetings:   [name] Marital Status:     Family History  Problem Relation Age of Onset  . Hyperlipidemia Mother   . Hypertension Mother   . Ovarian cancer Mother   . Aneurysm Mother        ascending & descending aorta  . Heart attack Father 7  . Leukemia Paternal Grandmother   . Lung  cancer Paternal Grandfather   . Diabetes Neg Hx   . Stroke Neg Hx   . Colon cancer Neg Hx   . Stomach cancer Neg Hx   . Rectal cancer Neg Hx   . Breast cancer Neg Hx     Review of Systems  Constitutional: Negative for chills, fatigue and fever.  Gastrointestinal: Positive for anal bleeding. Negative for abdominal pain, blood in stool, nausea and rectal pain.  Neurological: Negative for light-headedness and headaches.       Objective:   Vitals:   01/26/20 1333  BP: 134/76  Pulse: 97  Resp: 16  Temp: 97.9 F (36.6 C)  SpO2: 98%   BP Readings from Last 3 Encounters:  01/26/20 134/76  10/22/19 112/78  01/11/19 110/74   Wt Readings from Last 3 Encounters:  01/26/20 153 lb 9.6 oz (69.7 kg)  10/22/19 154 lb 0.6 oz (69.9 kg)  01/28/19 153 lb (69.4 kg)   Body mass index is 22.52 kg/m.   Physical Exam Constitutional:      General: She is not in acute distress.    Appearance: Normal appearance. She is not ill-appearing.  HENT:     Head: Normocephalic and atraumatic.  Abdominal:     General: Abdomen is flat. There is no distension.     Palpations: Abdomen is soft. There is no mass.     Tenderness: There is no abdominal tenderness. There is no guarding or rebound.     Hernia: No hernia is present.  Genitourinary:    Rectum: Normal.     Comments: No external hemorrhoids.  Slight skin irritation around the anus likely from wiping.  Rectal tone normal.  No anal fissures.  Minimal brown stool in rectal  vault Skin:    General: Skin is warm and dry.  Neurological:     Mental Status: She is alert.            Assessment & Plan:    See Problem List for Assessment and Plan of chronic medical problems.    This visit occurred during the SARS-CoV-2 public health emergency.  Safety protocols were in place, including screening questions prior to the visit, additional usage of staff PPE, and extensive cleaning of exam room while observing appropriate contact time as indicated for disinfecting solutions.

## 2020-01-26 ENCOUNTER — Ambulatory Visit (INDEPENDENT_AMBULATORY_CARE_PROVIDER_SITE_OTHER): Payer: PPO | Admitting: Internal Medicine

## 2020-01-26 ENCOUNTER — Other Ambulatory Visit: Payer: Self-pay

## 2020-01-26 ENCOUNTER — Encounter: Payer: Self-pay | Admitting: Internal Medicine

## 2020-01-26 VITALS — BP 134/76 | HR 97 | Temp 97.9°F | Resp 16 | Ht 69.25 in | Wt 153.6 lb

## 2020-01-26 DIAGNOSIS — Z23 Encounter for immunization: Secondary | ICD-10-CM

## 2020-01-26 DIAGNOSIS — R195 Other fecal abnormalities: Secondary | ICD-10-CM | POA: Insufficient documentation

## 2020-01-26 NOTE — Patient Instructions (Signed)
Start taking metamucil 1-2 times a day for soft but formed stool.   A referral was ordered for GI.

## 2020-01-26 NOTE — Assessment & Plan Note (Signed)
Acute Started 2 months ago for no apparent reason-no change in medications, diet, activity Having soft-semiformed stools more frequently, loss of control and some fecal incontinence Specks of blood on tissue from wiping-likely related to irritation of the external skin No blood in the stool, abdominal pain, weight loss Start Metamucil 1-2 times daily to obtain formed stool Advised that she will need to drink plenty of water Will refer to GI.  She is due for colonoscopy later this year

## 2020-01-28 ENCOUNTER — Ambulatory Visit: Payer: PPO

## 2020-02-01 ENCOUNTER — Other Ambulatory Visit: Payer: Self-pay | Admitting: Internal Medicine

## 2020-02-01 ENCOUNTER — Other Ambulatory Visit (HOSPITAL_COMMUNITY): Payer: Self-pay | Admitting: Internal Medicine

## 2020-02-15 ENCOUNTER — Other Ambulatory Visit: Payer: Self-pay

## 2020-02-15 ENCOUNTER — Ambulatory Visit
Admission: RE | Admit: 2020-02-15 | Discharge: 2020-02-15 | Disposition: A | Discharge status: Home / Self Care | Payer: PPO | Source: Ambulatory Visit | Attending: Obstetrics & Gynecology | Admitting: Obstetrics & Gynecology

## 2020-02-15 DIAGNOSIS — Z1231 Encounter for screening mammogram for malignant neoplasm of breast: Secondary | ICD-10-CM | POA: Diagnosis not present

## 2020-02-17 ENCOUNTER — Telehealth: Payer: Self-pay | Admitting: Internal Medicine

## 2020-02-17 ENCOUNTER — Encounter: Payer: Self-pay | Admitting: Internal Medicine

## 2020-02-17 DIAGNOSIS — E034 Atrophy of thyroid (acquired): Secondary | ICD-10-CM

## 2020-02-17 NOTE — Telephone Encounter (Signed)
Ordered please schedule.

## 2020-02-17 NOTE — Telephone Encounter (Signed)
New message:   Pt is calling and states she needs some labs to check her thyroid levels. She states she just wants to make sure her medication is at the right dosage. Please advise.

## 2020-02-18 NOTE — Telephone Encounter (Signed)
Notified pt w/MD response. Made lab appt for Monday 02/21/20.Marland KitchenJohny Pratt

## 2020-02-21 ENCOUNTER — Other Ambulatory Visit: Payer: PPO

## 2020-02-29 ENCOUNTER — Other Ambulatory Visit (HOSPITAL_COMMUNITY): Payer: Self-pay | Admitting: Internal Medicine

## 2020-02-29 ENCOUNTER — Other Ambulatory Visit: Payer: Self-pay | Admitting: Internal Medicine

## 2020-03-15 IMAGING — RF DG ESOPHAGUS
6 series · 14 of 22 positions shown · non-contrast
Comparison: CT neck 12/31/2017, CT chest 11/13/2017 and esophagram
09/05/2006.

CLINICAL DATA: Dysphagia with right-sided food sticking. History of
tonsillar cancer with tonsillectomy and adenoidectomy in 6556. Cor
pulmonale.

EXAM:
ESOPHOGRAM / BARIUM SWALLOW / BARIUM TABLET STUDY
TECHNIQUE: Combined double contrast and single contrast examination performed
using effervescent crystals, thick barium liquid, and thin barium
liquid. The patient was observed with fluoroscopy swallowing a 13 mm
barium sulphate tablet.
FLUOROSCOPY TIME:  Fluoroscopy Time: 1 minutes and 18 seconds of
low-dose pulsed fluoro
Radiation Exposure Index (if provided by the fluoroscopic device):
23 mGy
Number of Acquired Spot Images: 2 rapid sequence series of the
cervical esophagus.

[Series 1: one shot · 2 of 3 slices shown (1 of 3)]
[im 1/3]
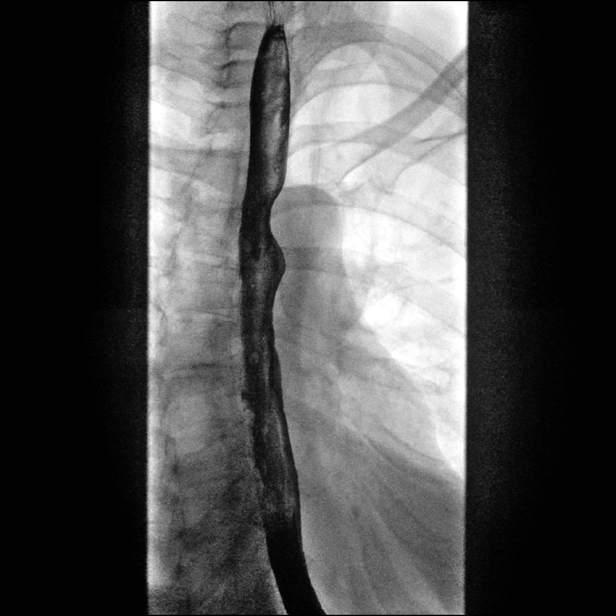
[im 3/3]
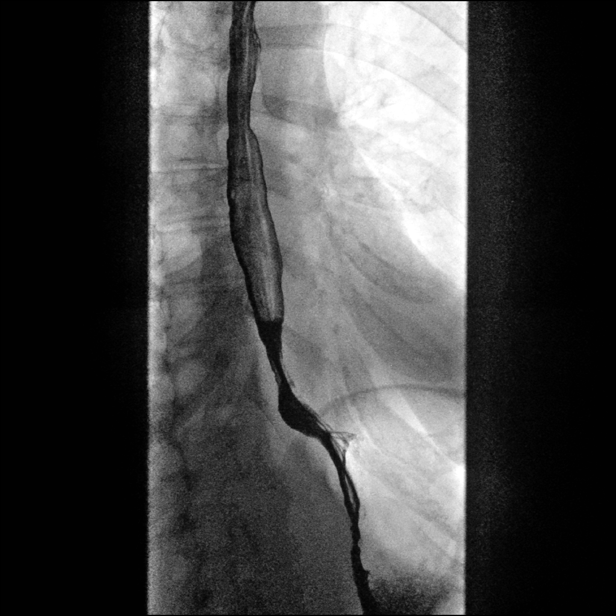

[Series 2: sequence · 2 of 10 frames shown (1 of 3)]
[frame 2/10]
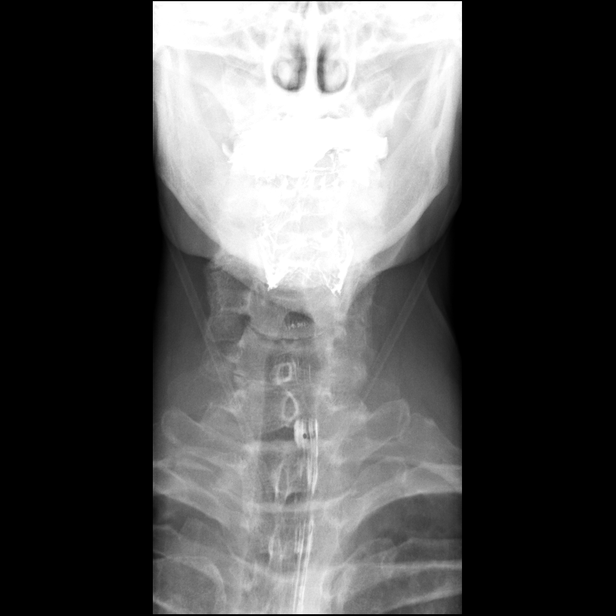
[frame 6/10]
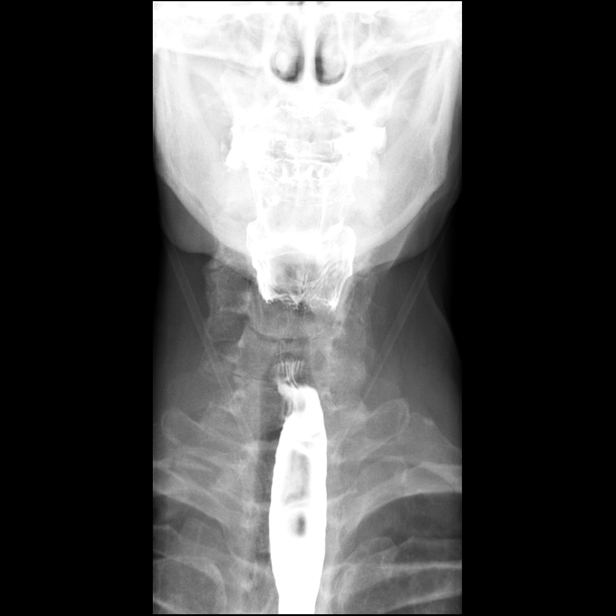

[Series 3: sequence · 3 of 14 frames shown (2 of 3)]
[frame 3/14]
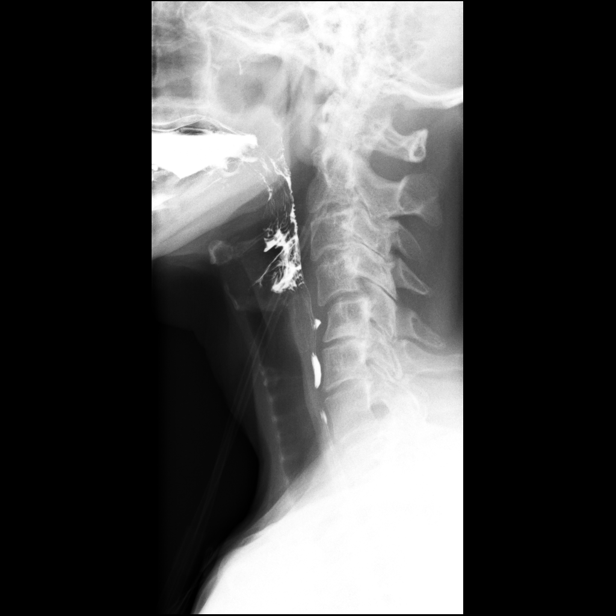
[frame 8/14]
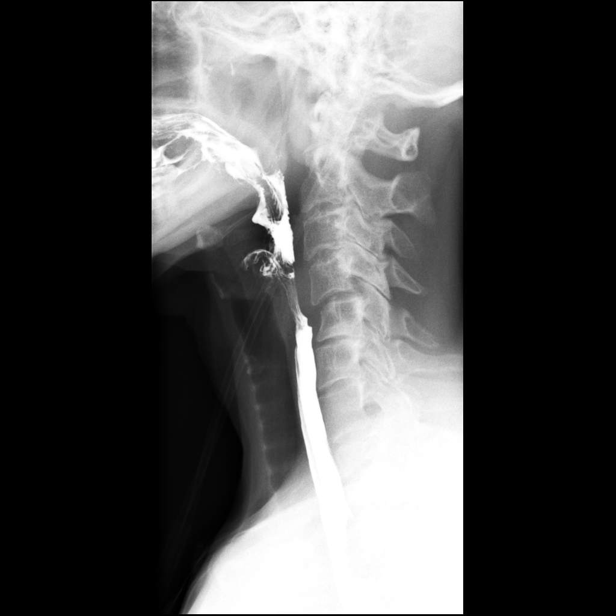
[frame 13/14]
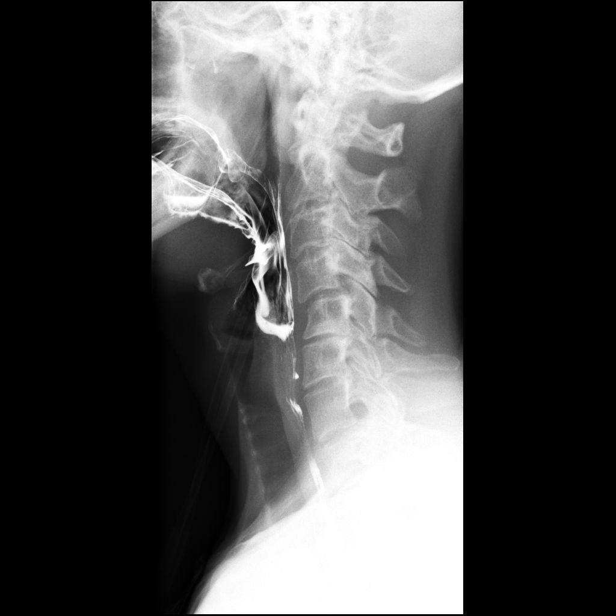

[Series 4: one shot · 1 of 1 slices shown (2 of 3)]
[im 1/1]
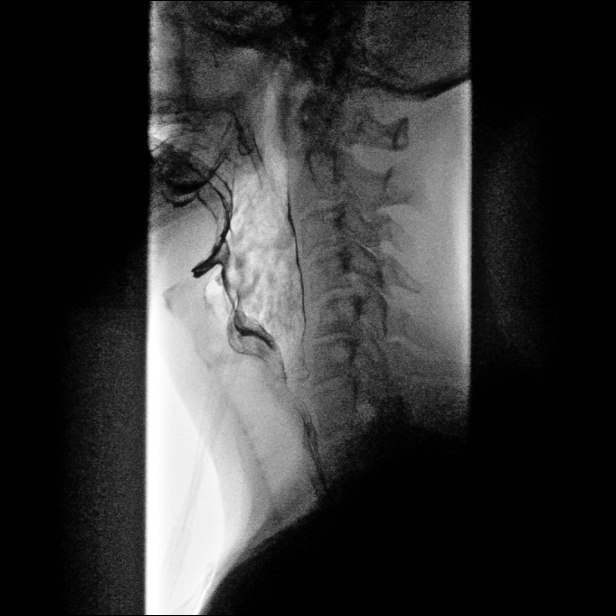

[Series 5: sequence · 2 of 21 frames shown (3 of 3)]
[frame 11/21]
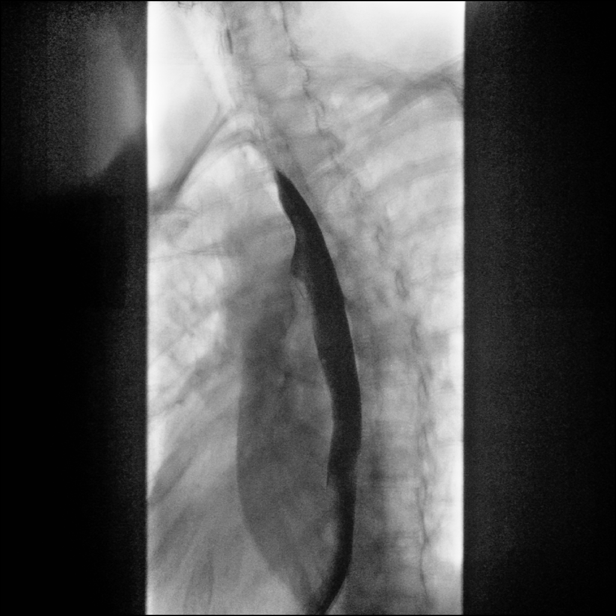
[frame 18/21]
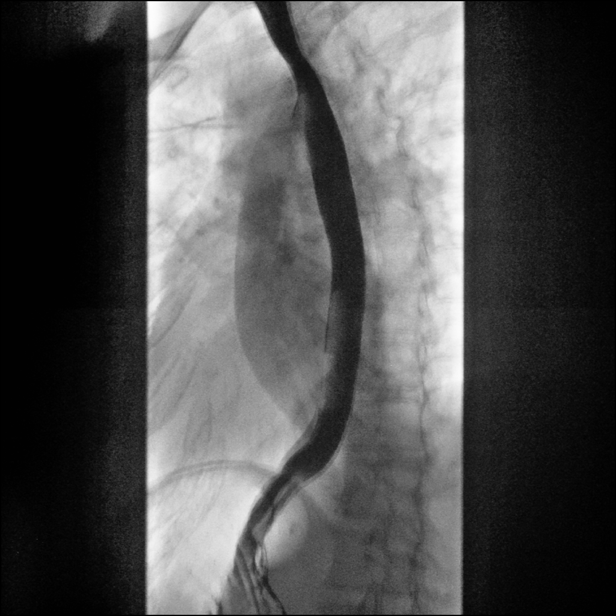

[Series 6: one shot · 4 of 7 slices shown (3 of 3)]
[im 2/7]
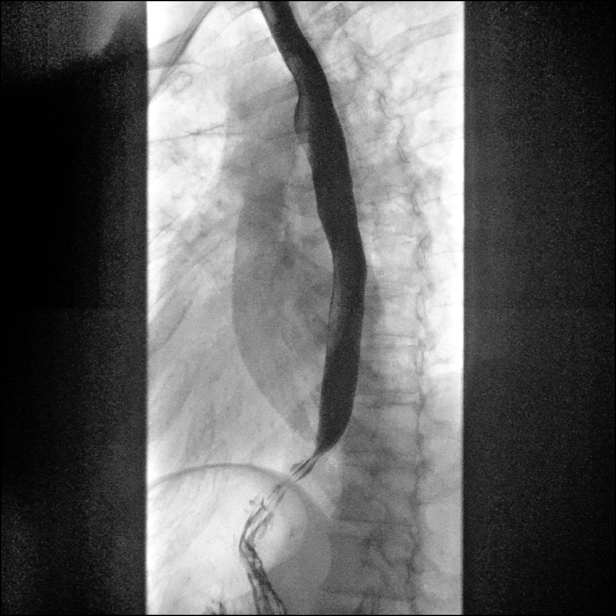
[im 4/7]
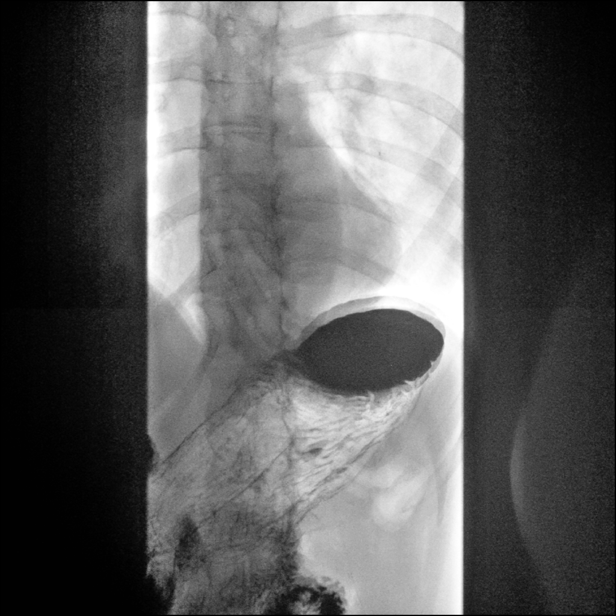
[im 5/7]
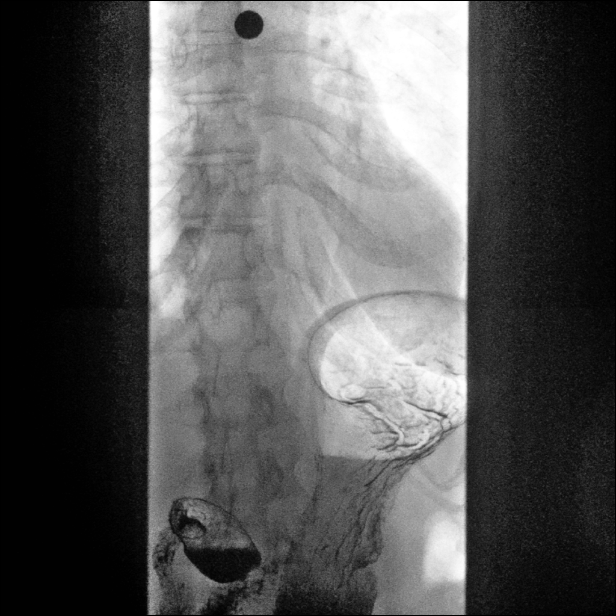
[im 7/7]
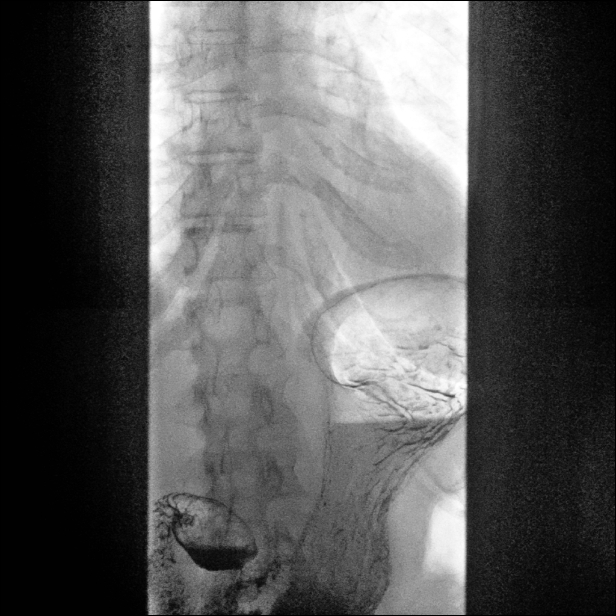

[14 of 22 positions shown; findings below may reference images not displayed]

FINDINGS: The esophageal motility appears normal. There is no evidence of
stricture, mass, ulceration or hiatal hernia.

Rapid sequence imaging of the pharynx in the AP and lateral
projections demonstrates no mucosal abnormality. There was trace
laryngeal penetration, but no aspiration. Mild cervical spondylosis
is present without significant impression on the posterior cervical
esophagus.

No reflux was demonstrated with the water siphon test. A 13 mm
barium tablet was administered and passed without delay into the
stomach.
IMPRESSION: 1. Flash laryngeal penetration.
2. Otherwise normal study. No stricture or other mucosal abnormality
identified.

## 2020-03-28 ENCOUNTER — Other Ambulatory Visit: Payer: Self-pay

## 2020-03-28 ENCOUNTER — Ambulatory Visit (HOSPITAL_COMMUNITY)
Admission: RE | Admit: 2020-03-28 | Discharge: 2020-03-28 | Disposition: A | Discharge status: Home / Self Care | Payer: PPO | Source: Ambulatory Visit | Attending: Internal Medicine | Admitting: Internal Medicine

## 2020-03-28 ENCOUNTER — Encounter (HOSPITAL_COMMUNITY): Payer: Self-pay | Admitting: Internal Medicine

## 2020-03-28 VITALS — BP 134/98 | HR 94

## 2020-03-28 DIAGNOSIS — Z801 Family history of malignant neoplasm of trachea, bronchus and lung: Secondary | ICD-10-CM | POA: Diagnosis not present

## 2020-03-28 DIAGNOSIS — Z86711 Personal history of pulmonary embolism: Secondary | ICD-10-CM | POA: Diagnosis not present

## 2020-03-28 DIAGNOSIS — I2721 Secondary pulmonary arterial hypertension: Secondary | ICD-10-CM | POA: Diagnosis not present

## 2020-03-28 DIAGNOSIS — I2724 Chronic thromboembolic pulmonary hypertension: Secondary | ICD-10-CM | POA: Diagnosis not present

## 2020-03-28 DIAGNOSIS — J9611 Chronic respiratory failure with hypoxia: Secondary | ICD-10-CM | POA: Diagnosis not present

## 2020-03-28 DIAGNOSIS — M1990 Unspecified osteoarthritis, unspecified site: Secondary | ICD-10-CM | POA: Insufficient documentation

## 2020-03-28 DIAGNOSIS — Z79899 Other long term (current) drug therapy: Secondary | ICD-10-CM | POA: Diagnosis not present

## 2020-03-28 DIAGNOSIS — F419 Anxiety disorder, unspecified: Secondary | ICD-10-CM | POA: Insufficient documentation

## 2020-03-28 DIAGNOSIS — Z85818 Personal history of malignant neoplasm of other sites of lip, oral cavity, and pharynx: Secondary | ICD-10-CM | POA: Insufficient documentation

## 2020-03-28 DIAGNOSIS — Z7901 Long term (current) use of anticoagulants: Secondary | ICD-10-CM | POA: Diagnosis not present

## 2020-03-28 DIAGNOSIS — I447 Left bundle-branch block, unspecified: Secondary | ICD-10-CM | POA: Insufficient documentation

## 2020-03-28 DIAGNOSIS — Z881 Allergy status to other antibiotic agents status: Secondary | ICD-10-CM | POA: Insufficient documentation

## 2020-03-28 DIAGNOSIS — I10 Essential (primary) hypertension: Secondary | ICD-10-CM | POA: Diagnosis not present

## 2020-03-28 DIAGNOSIS — E039 Hypothyroidism, unspecified: Secondary | ICD-10-CM | POA: Insufficient documentation

## 2020-03-28 DIAGNOSIS — Z86718 Personal history of other venous thrombosis and embolism: Secondary | ICD-10-CM | POA: Insufficient documentation

## 2020-03-28 DIAGNOSIS — Z8349 Family history of other endocrine, nutritional and metabolic diseases: Secondary | ICD-10-CM | POA: Insufficient documentation

## 2020-03-28 DIAGNOSIS — Z8249 Family history of ischemic heart disease and other diseases of the circulatory system: Secondary | ICD-10-CM | POA: Insufficient documentation

## 2020-03-28 MED ORDER — APIXABAN 5 MG PO TABS: 5.0000 mg | ORAL_TABLET | Freq: Two times a day (BID) | ORAL | 11 refills | Status: DC

## 2020-03-28 NOTE — Patient Instructions (Signed)
Your physician has requested that you have an echocardiogram. Echocardiography is a painless test that uses sound waves to create images of your heart. It provides your doctor with information about the size and shape of your heart and how well your heart's chambers and valves are working. This procedure takes approximately one hour. There are no restrictions for this procedure.  Your physician recommends that you schedule a follow-up appointment in: 4 weeks  If you have any questions or concerns before your next appointment please send [place] a message through [place] or call our office at [phone].

## 2020-03-28 NOTE — Addendum Note (Signed)
Encounter addended by: Scarlette Calico, RN on: 03/28/2020 11:30 AM  Actions taken: Pharmacy for encounter modified, Order list changed, Diagnosis association updated, Clinical Note Signed

## 2020-03-28 NOTE — Progress Notes (Signed)
Advanced Heart Failure Clinic Note   03/28/2020 Danielle Pratt   03/01/1951  QN:[id number]  Primary Physician Binnie Rail, MD Primary Cardiologist: Lorretta Harp MD [name], [place], [place]  HPI:  Danielle Pratt is a 69 y.o. thin and fit-appearing woman referred by Dr. 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 She is here with her husband Rett Saslow.   She has also been followed by Dr. Lebron Conners , hematology oncology, for evaluation and treatment of chronic pulmonary thromboembolic diseases.   Denies any known h/o cardiac disease. 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 Smoked a little bit as a teenager but none since. Reports a superficial LLE clot in 1994. In March 2017 was diagnosed with a DVT and PE  that occurred in association with decreased mobility due to a brace applied for dislocation of an implanted hip. She was on oral anticoagulation for 6 months after that.   She had recurrent shortness of breath and fatigue in January 2019. Presented to ED and found to have bilateral segmental and subsegmental PE. No evidence of significant RV strain so not given thrombolytics. LE dopplers were negative. Started on Eliquis. Hypercoag w/u by Dr. Lebron Conners has been negative. She denies FHX of hypercoag disorders, PAH or CTEPH. No h/o CTD.   Observed 4/22 - 03/03/18 s/p cath. Cath showed cors and very mild PAH. Had iLBBB on baseline ECG but complete LBBB on ECG from am of procedure. During manipulation of the pigtail in her RV she developed CHB with no ventricular escape. TVP wire placed immediately but patient recovered spontaneously. Kept  for observation.  CTA chest 03/03/18 showed "Stable appearance of probable chronic bilateral pulmonary emboli are noted. No new or acute pulmonary emboli are noted. RV LV ratio is measured at 1.2 suggesting right heart strain. No other abnormality seen in the chest.  Underwent successful pulmonary thrombo-embolectomy with Dr.  Lincoln Brigham at [facility] in June 2019.  She returns today for 2 year f/u. Overall feeling quite well. No SOB, orthopnea or PND. No bleeding on blood thinners. Notes that over past few months BP has been running ab it high at doctor's office with SBP 130-140s. No change in meds or diets. Has gained about 8-10 pounds over the past year. Not as active as she was before   Cardiac studies:  Echo 12/19 personally reviewed LVEF 55% RV ok with mild septal flattening Personally reviewed   RHC 10/22/18 RA = 1 RV = 26/2 PA = 29/7 (17) PCW = 3 Fick cardiac output/index = 4.7/2.6 PVR = 3.0 WU Ao sat = 99% PA sat = 67%, 71%   R/LHC 03/02/18 Ao =118/70 (91) LV = 127/12 RA = 2 RV = 47/3 PA = 46/17 (28) PCW = 16 Fick cardiac output/index = 4.5/2.4 PVR = 2.6 WU FA sat = 93% on 3L PA sat = 61%, 63%  Assessment: 1. Normal coronary arteries with left-dominant system 2. Normal LV function by echo 3. Very mild PAH due to CTEPH 4. Development of CHB with attempts to place pigtail catheter for pulmonary angiography - resolved spontaneously  VQ scan  02/13/18 1. High probability study for pulmonary thromboembolism. Multiple bilateral segmental perfusion defects, on the left partly matched with ventilatory defects, and with no corresponding chest radiographic abnormality.  Echo 2/19 LVEF 50-55% RV mildly dialted but systolic function is normal. Trivial TR. IVC ok   Review of systems complete and found to be negative unless listed in HPI.   Past Medical  History:  Diagnosis Date  . Anxiety   . Arthritis    "hands" (11/12/2017)  . Cataract    extraction surgery  . DVT (deep venous thrombosis) ([place]) 01/2016   LLE  . Fasting hyperglycemia    110 in 01/2011;131 in 05/2007  . Hypothyroidism   . Popliteal cyst   . Pulmonary embolism ([place]) 2017; 11/11/2017   "the one in 2017 was due to LLE DVT; don't why I had the one 11/11/2017"  . Renal insufficiency   . Tonsillar cancer ([place]) 1998     Current Meds   Medication Sig  . clobetasol cream (TEMOVATE) [id number] % Apply 1 application topically at bedtime as needed (for skin irritation/finger).   [name] ELIQUIS 5 MG TABS tablet TAKE ONE TABLET BY MOUTH TWICE A DAY  . levothyroxine (SYNTHROID) 88 MCG tablet Take 1 tablet (88 mcg total) by mouth daily.  . pilocarpine (SALAGEN) 5 MG tablet Take 5 mg by mouth See admin instructions. Take 1 tablet (5 mg) by mouth 3 times daily - early morning, midday and bedtime, may take an additional tablet during the day as needed for dry mouth.  . tretinoin (RETIN-A) 0.05 % cream Apply 1 application topically at bedtime.  [name] venlafaxine XR (EFFEXOR-XR) 75 MG 24 hr capsule TAKE ONE CAPSULE BY MOUTH EVERY NIGHT AT BEDTIME     Allergies  Allergen Reactions  . Omnicef [Cefdinir]     Facial swelling    Social History   Socioeconomic History  . Marital status: Married    Spouse name: Not on file  . Number of children: Not on file  . Years of education: Not on file  . Highest education level: Not on file  Occupational History  . Not on file  Tobacco Use  . Smoking status: Never Smoker  . Smokeless tobacco: Never Used  Substance and Sexual Activity  . Alcohol use: Yes    Alcohol/week: 3.0 standard drinks    Types: 3 Cans of beer per week  . Drug use: No  . Sexual activity: Yes  Other Topics Concern  . Not on file  Social History Narrative  . Not on file   Social Determinants of Health   Financial Resource Strain:   . Difficulty of Paying Living Expenses:   Food Insecurity:   . Worried About Charity fundraiser in the Last Year:   . Arboriculturist in the Last Year:   Transportation Needs:   . Film/video editor (Medical):   [name] Lack of Transportation (Non-Medical):   Physical Activity:   . Days of Exercise per Week:   . Minutes of Exercise per Session:   Stress:   . Feeling of Stress :   Social Connections:   . Frequency of Communication with Friends and Family:   . Frequency of Social Gatherings  with Friends and Family:   . Attends Religious Services:   . Active Member of Clubs or Organizations:   . Attends Archivist Meetings:   [name] Marital Status:   Intimate Partner Violence:   . Fear of Current or Ex-Partner:   . Emotionally Abused:   [name] Physically Abused:   . Sexually Abused:     Family History  Problem Relation Age of Onset  . Hyperlipidemia Mother   . Hypertension Mother   . Ovarian cancer Mother   . Aneurysm Mother        ascending & descending aorta  . Heart attack Father 52  . Leukemia Paternal  Grandmother   . Lung cancer Paternal Grandfather   . Diabetes Neg Hx   . Stroke Neg Hx   . Colon cancer Neg Hx   . Stomach cancer Neg Hx   . Rectal cancer Neg Hx   . Breast cancer Neg Hx    Vitals:   03/28/20 1028  BP: (!) 134/98  Pulse: 94  SpO2: 97%   Wt Readings from Last 3 Encounters:  01/26/20 69.7 kg (153 lb 9.6 oz)  10/22/19 69.9 kg (154 lb 0.6 oz)  01/28/19 69.4 kg (153 lb)    Physical exam  General:  Well appearing. No resp difficulty HEENT: normal Neck: supple. no JVD. Carotids 2+ bilat; no bruits. No lymphadenopathy or thryomegaly appreciated. Cor: PMI nondisplaced. Regular rate & rhythm. No rubs, gallops or murmurs. Lungs: clear Abdomen: soft, nontender, nondistended. No hepatosplenomegaly. No bruits or masses. Good bowel sounds. Extremities: no cyanosis, clubbing, rash, edema Neuro: alert & orientedx3, cranial nerves grossly intact. moves all 4 extremities w/o difficulty. Affect pleasant  ECG: NSR 88 biatrial enlargement LVH IVCD Personally reviewed  ASSESSMENT AND PLAN:   1. PAH due Chronic pulmonary emboli with CTEPH - RHC 03/02/18 with normal coronaries, normal LV function, very mild PAH due to CTEPH.  - Minimal RV strain on echo and lack of significant TR jet.  - s/p pulmonary thrombo-embolectomy with Dr. Lincoln Brigham at [facility] in June 2019. - On eliquis 5 mg BID - RHC 12/19 with completely normalized pressures - Echo 12/19 with EF 55%  RV mildly dilated with mild septal flattening  - On echo there is mild septal flattening but pressures normal by cath. Some of septal abnormality likely due to LBBB - Repeat echo - Continue Eliquis   2. Chronic hypoxic respiratory failure due to #1 - Off O2 after thrombo-embolectomy  3. LBBB with intra-procedural CHB during attempted pulmonary angiography with pigtail catheter -stable  4. Essential HTN - this is new diagnosis - will have her f/u her BP daily for 4 weeks - televist in 4 weeks and can add anti-HTN med as needed  Glori Bickers, MD  11:07 AM

## 2020-03-30 ENCOUNTER — Other Ambulatory Visit (HOSPITAL_COMMUNITY): Payer: Self-pay | Admitting: Internal Medicine

## 2020-04-01 ENCOUNTER — Other Ambulatory Visit: Payer: Self-pay | Admitting: Internal Medicine

## 2020-04-06 ENCOUNTER — Ambulatory Visit (HOSPITAL_COMMUNITY)
Admission: RE | Admit: 2020-04-06 | Discharge: 2020-04-06 | Disposition: A | Discharge status: Home / Self Care | Payer: PPO | Source: Ambulatory Visit | Attending: Internal Medicine | Admitting: Internal Medicine

## 2020-04-06 ENCOUNTER — Other Ambulatory Visit: Payer: Self-pay

## 2020-04-06 DIAGNOSIS — I77810 Thoracic aortic ectasia: Secondary | ICD-10-CM | POA: Diagnosis not present

## 2020-04-06 DIAGNOSIS — Z86711 Personal history of pulmonary embolism: Secondary | ICD-10-CM | POA: Diagnosis not present

## 2020-04-06 DIAGNOSIS — I2721 Secondary pulmonary arterial hypertension: Secondary | ICD-10-CM | POA: Insufficient documentation

## 2020-04-06 DIAGNOSIS — Z86718 Personal history of other venous thrombosis and embolism: Secondary | ICD-10-CM | POA: Diagnosis not present

## 2020-04-06 DIAGNOSIS — E039 Hypothyroidism, unspecified: Secondary | ICD-10-CM | POA: Diagnosis not present

## 2020-04-06 NOTE — Progress Notes (Signed)
  Echocardiogram 2D Echocardiogram has been performed.  [name] 04/06/2020, 10:16 AM

## 2020-04-24 NOTE — Progress Notes (Signed)
Heart Failure TeleHealth Note  Due to national recommendations of social distancing due to [place] 19, Audio/video telehealth visit is felt to be most appropriate for this patient at this time.  See MyChart message from today for patient consent regarding telehealth for [facility]. The patient was identified personally using two identifiers.   Date:  04/24/2020   ID:  Danielle Pratt, DOB Jul 18, 1951, MRN [id number]  Location: Home  Provider location: [facility] Type of Visit: Established patient  PCP:  Binnie Rail, MD  Cardiologist:  No primary care provider on file. Primary HF: Kieran Arreguin  Chief Complaint: Heart Failure follow-up   History of Present Illness:  HPI:  Danielle Pratt is a 69 y.o. thin and fit-appearing woman referred by Dr. 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 She is here with her husband Rett Saslow.   She has also been followed by Dr. Lebron Conners, hematology oncology, for evaluation and treatment of chronic pulmonary thromboembolic diseases.   Denies any known h/o cardiac disease. 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 Smoked a little bit as a teenager but none since. Reports a superficial LLE clot in 1994. In March 2017 was diagnosed with a DVT and PE  that occurred in association with decreased mobility due to a brace applied for dislocation of an implanted hip. She was on oral anticoagulation for 6 months after that.   She had recurrent shortness of breath and fatigue in January 2019. Presented to ED and found to have bilateral segmental and subsegmental PE. No evidence of significant RV strain so not given thrombolytics. LE dopplers were negative. Started on Eliquis. Hypercoag w/u by Dr. Lebron Conners has been negative. She denies FHX of hypercoag disorders, PAH or CTEPH. No h/o CTD.   Observed 4/22 - 03/03/18 s/p cath. Cath showed cors and very mild PAH. Had iLBBB on baseline ECG but complete LBBB  on ECG fromam of procedure.During manipulation of the pigtail in her RV she developed CHB with no ventricular escape. TVP wire placed immediately but patient recovered spontaneously. Kept  forobservation.  CTA chest 03/03/18 showed"Stable appearance of probable chronic bilateral pulmonary emboli are noted. No new or acute pulmonary emboli are noted. RV LV ratio is measured at 1.2 suggesting right heart strain. No other abnormality seen in the chest.  Underwent successful pulmonary thrombo-embolectomy with Dr. Lincoln Brigham at [facility] in June 2019.  I saw her on 03/28/20 for 2 year f/u. Was doing well. F/u echo ordered.   Echo 04/06/20: EF 55-60% Normal RV. TR jet inadequate to estimate PAP. Mild AoRoot dilation 3.8 cm Personally reviewed  She presents via Engineer, civil (consulting) for a telehealth visit today.  She says she feels great. No SOB, edema, orthopnea or PND. Says BP is "perfect". Only 3 readings > 130 (highest 135/82). Had one reading 81/59 (felt a little dizzy on standing). Most readings 110-120/60-70 range.   Echo 04/06/20 EF 55-60% Grade I DD. RV normal.   Cardiac studies:  Echo 12/19 LVEF 55% RV ok with mild septal flattening Personally reviewed   RHC 10/22/18 RA = 1 RV = 26/2 PA = 29/7 (17) PCW = 3 Fick cardiac output/index = 4.7/2.6 PVR = 3.0 WU Ao sat = 99% PA sat = 67%, 71%   R/LHC 03/02/18 Ao =118/70 (91) LV = 127/12 RA = 2 RV = 47/3 PA = 46/17 (28) PCW = 16 Fick cardiac output/index = 4.5/2.4 PVR = 2.6 WU FA sat = 93%  on 3L PA sat = 61%, 63%  Assessment: 1. Normal coronary arteries with left-dominant system 2. Normal LV function by echo 3. Very mild PAH due to CTEPH 4. Development of CHB with attempts to place pigtail catheter for pulmonary angiography - resolved spontaneously  VQ scan  02/13/18 1. High probability study for pulmonary thromboembolism. Multiple bilateral segmental perfusion defects, on the left partly matched with ventilatory  defects, and with no corresponding chest radiographic abnormality.  Echo 2/19 LVEF 50-55% RV mildly dialted but systolic function is normal. Trivial TR. IVC ok    Danielle Pratt denies symptoms worrisome for COVID 19.   Past Medical History:  Diagnosis Date  . Anxiety   . Arthritis    "hands" (11/12/2017)  . Cataract    extraction surgery  . DVT (deep venous thrombosis) ([place]) 01/2016   LLE  . Fasting hyperglycemia    110 in 01/2011;131 in 05/2007  . Hypothyroidism   . Popliteal cyst   . Pulmonary embolism ([place]) 2017; 11/11/2017   "the one in 2017 was due to LLE DVT; don't why I had the one 11/11/2017"  . Renal insufficiency   . Tonsillar cancer ([place]) 1998   Past Surgical History:  Procedure Laterality Date  . CESAREAN SECTION  1984   G 1 P 2  . COLONOSCOPY  2003   negative; Dr Henrene Pastor  . JOINT REPLACEMENT    . LYMPHADENECTOMY Right 1998   neck  . RIGHT HEART CATH N/A 10/22/2018   Procedure: RIGHT HEART CATH;  Surgeon: Jolaine Artist, MD;  Location: [place] [facility];  Service: Cardiovascular;  Laterality: N/A;  . RIGHT/LEFT HEART CATH AND CORONARY ANGIOGRAPHY N/A 03/02/2018   Procedure: RIGHT/LEFT HEART CATH AND CORONARY ANGIOGRAPHY;  Surgeon: Jolaine Artist, MD;  Location: [place] [facility];  Service: Cardiovascular;  Laterality: N/A;  . TEMPORARY PACEMAKER N/A 03/02/2018   Procedure: TEMPORARY PACEMAKER;  Surgeon: Jolaine Artist, MD;  Location: [place] [facility];  Service: Cardiovascular;  Laterality: N/A;  . TONSILLECTOMY AND ADENOIDECTOMY Right 1998   R tonsilar malignancy; subsequent LN dissection negative  . TOTAL ABDOMINAL HYSTERECTOMY  1980s   TOTAL ABDOMINAL HYSTERECTOMY W/ BILATERAL SALPINGOOPHORECTOMY [SHX83]; fibroids; Dr. Nori Riis  . TOTAL HIP ARTHROPLASTY Bilateral 2007   Dr Maureen Ralphs, both hips 3 months apart     Current Outpatient Medications  Medication Sig Dispense Refill  . apixaban (ELIQUIS) 5 MG TABS tablet Take 1 tablet (5 mg total) by  mouth 2 (two) times daily. 60 tablet 11  . clobetasol cream (TEMOVATE) 1.02 % Apply 1 application topically at bedtime as needed (for skin irritation/finger).     [name] levothyroxine (SYNTHROID) 88 MCG tablet Take 1 tablet (88 mcg total) by mouth daily. 90 tablet 3  . pilocarpine (SALAGEN) 5 MG tablet Take 5 mg by mouth See admin instructions. Take 1 tablet (5 mg) by mouth 3 times daily - early morning, midday and bedtime, may take an additional tablet during the day as needed for dry mouth.    . tretinoin (RETIN-A) 0.05 % cream Apply 1 application topically at bedtime.    [name] venlafaxine XR (EFFEXOR-XR) 75 MG 24 hr capsule TAKE ONE CAPSULE BY MOUTH EVERY NIGHT AT BEDTIME. Need office visit for more refills. 30 capsule 0   No current facility-administered medications for this encounter.    Allergies:   Omnicef [cefdinir]   Social History:  The patient  reports that she has never smoked. She has never used smokeless tobacco. She reports  current alcohol use of about 3.0 standard drinks of alcohol per week. She reports that she does not use drugs.   Family History:  The patient's family history includes Aneurysm in her mother; Heart attack (age of onset: 42) in her father; Hyperlipidemia in her mother; Hypertension in her mother; Leukemia in her paternal grandmother; Lung cancer in her paternal grandfather; Ovarian cancer in her mother.   ROS:  Please see the history of present illness.   All other systems are personally reviewed and negative.   Exam:  (Video/Tele Health Call; Exam is subjective and or/visual.) General:  Speaks in full sentences. No resp difficulty. Lungs: Normal respiratory effort with conversation.  Abdomen: Non-distended per patient report Extremities: Pt denies edema. Neuro: Alert & oriented x 3.   Recent Labs: 10/22/2019: TSH 8.57  Personally reviewed   Wt Readings from Last 3 Encounters:  01/26/20 69.7 kg (153 lb 9.6 oz)  10/22/19 69.9 kg (154 lb 0.6 oz)  01/28/19 69.4  kg (153 lb)      ASSESSMENT AND PLAN:  1. PAH due Chronic pulmonary emboli with CTEPH - RHC 03/02/18 with normal coronaries, normal LV function, very mild PAH due to CTEPH.  - Minimal RV strain on echo and lack of significant TR jet.  - s/p pulmonary thrombo-embolectomy with Dr. Lincoln [facility]igham at [facility] in June 2019. - On eliquis 5 mg BID - RHC 12/19 with completely normalized pressures - Echo 12/19 with EF 55% RV mildly dilated with mild septal flattening  - On echo there is mild septal flattening but pressures normal by cath. Some of septal abnormality likely due to LBBB - Echo 5/21: Echo 04/06/20: EF 55-60% Normal RV. TR jet inadequate to estimate PAP. Mild AoRoot dilation 3.8 cm Personally reviewed - Continue Eliquis   2. Chronic hypoxic respiratory failure due to #1 - Off O2 after thrombo-embolectomy  3. LBBB with intra-procedural CHB during attempted pulmonary angiography with pigtail catheter -stable  4. Essential HTN - this is new diagnosis - BP looks good at home  - does not need anti-HTN med at this point    Her echo is stable. No evidence of ongoing PAH. BP looks great. Can f/u with Dr. Gwenlyn Found. We will be happy to see her back as needed.   COVID screen The patient does not have any symptoms that suggest any further testing/ screening at this time.  Social distancing reinforced today.  Recommended follow-up:  As above  Relevant cardiac medications were reviewed at length with the patient today.   The patient does not have concerns regarding their medications at this time.   The following changes were made today:  As above  Today, I have spent 12 minutes with the patient with telehealth technology discussing the above issues .    Signed, Glori Bickers, MD  04/24/2020 8:07 PM  Advanced Heart Failure [place] [address] Heart and [place] [address] [phone] (office) [phone] (fax)

## 2020-04-25 ENCOUNTER — Ambulatory Visit (HOSPITAL_COMMUNITY)
Admission: RE | Admit: 2020-04-25 | Discharge: 2020-04-25 | Disposition: A | Discharge status: Home / Self Care | Payer: PPO | Source: Ambulatory Visit | Attending: Internal Medicine | Admitting: Internal Medicine

## 2020-04-25 ENCOUNTER — Other Ambulatory Visit: Payer: Self-pay

## 2020-04-25 DIAGNOSIS — I2724 Chronic thromboembolic pulmonary hypertension: Secondary | ICD-10-CM | POA: Diagnosis not present

## 2020-04-25 DIAGNOSIS — I447 Left bundle-branch block, unspecified: Secondary | ICD-10-CM

## 2020-04-25 DIAGNOSIS — I2721 Secondary pulmonary arterial hypertension: Secondary | ICD-10-CM | POA: Diagnosis not present

## 2020-04-25 DIAGNOSIS — I10 Essential (primary) hypertension: Secondary | ICD-10-CM

## 2020-04-25 NOTE — Addendum Note (Signed)
Encounter addended by: Scarlette Calico, RN on: 04/25/2020 3:36 PM  Actions taken: Order list changed, Diagnosis association updated, Clinical Note Signed

## 2020-04-25 NOTE — Progress Notes (Signed)
Referral placed to Dr Kennon Holter office, AVS sent via mychart.

## 2020-04-25 NOTE — Patient Instructions (Signed)
Congratulations, you have graduated from our clinic  You have been referred back to Dr Gwenlyn Found at [facility], his office will call you for an appointment

## 2020-04-27 ENCOUNTER — Other Ambulatory Visit: Payer: Self-pay | Admitting: Internal Medicine

## 2020-05-23 DIAGNOSIS — L812 Freckles: Secondary | ICD-10-CM | POA: Diagnosis not present

## 2020-05-23 DIAGNOSIS — D225 Melanocytic nevi of trunk: Secondary | ICD-10-CM | POA: Diagnosis not present

## 2020-05-23 DIAGNOSIS — D2239 Melanocytic nevi of other parts of face: Secondary | ICD-10-CM | POA: Diagnosis not present

## 2020-05-23 DIAGNOSIS — D1801 Hemangioma of skin and subcutaneous tissue: Secondary | ICD-10-CM | POA: Diagnosis not present

## 2020-05-23 DIAGNOSIS — L821 Other seborrheic keratosis: Secondary | ICD-10-CM | POA: Diagnosis not present

## 2020-05-23 DIAGNOSIS — D2272 Melanocytic nevi of left lower limb, including hip: Secondary | ICD-10-CM | POA: Diagnosis not present

## 2020-05-23 DIAGNOSIS — D2271 Melanocytic nevi of right lower limb, including hip: Secondary | ICD-10-CM | POA: Diagnosis not present

## 2020-05-29 DIAGNOSIS — Z6821 Body mass index (BMI) 21.0-21.9, adult: Secondary | ICD-10-CM | POA: Diagnosis not present

## 2020-05-29 DIAGNOSIS — N958 Other specified menopausal and perimenopausal disorders: Secondary | ICD-10-CM | POA: Diagnosis not present

## 2020-05-29 DIAGNOSIS — M8588 Other specified disorders of bone density and structure, other site: Secondary | ICD-10-CM | POA: Diagnosis not present

## 2020-05-29 DIAGNOSIS — Z124 Encounter for screening for malignant neoplasm of cervix: Secondary | ICD-10-CM | POA: Diagnosis not present

## 2020-05-30 DIAGNOSIS — Z124 Encounter for screening for malignant neoplasm of cervix: Secondary | ICD-10-CM | POA: Diagnosis not present

## 2020-06-25 ENCOUNTER — Other Ambulatory Visit: Payer: Self-pay | Admitting: Internal Medicine

## 2020-08-07 ENCOUNTER — Other Ambulatory Visit: Payer: PPO

## 2020-08-07 DIAGNOSIS — Z20822 Contact with and (suspected) exposure to covid-19: Secondary | ICD-10-CM | POA: Diagnosis not present

## 2020-08-08 ENCOUNTER — Ambulatory Visit: Payer: PPO

## 2020-08-08 LAB — SARS-COV-2, NAA 2 DAY TAT

## 2020-08-08 LAB — NOVEL CORONAVIRUS, NAA: SARS-CoV-2, NAA: DETECTED — AB

## 2020-08-09 ENCOUNTER — Other Ambulatory Visit: Payer: Self-pay | Admitting: Nurse Practitioner

## 2020-08-09 ENCOUNTER — Encounter: Payer: Self-pay | Admitting: Nurse Practitioner

## 2020-08-09 DIAGNOSIS — I82441 Acute embolism and thrombosis of right tibial vein: Secondary | ICD-10-CM

## 2020-08-09 DIAGNOSIS — I2721 Secondary pulmonary arterial hypertension: Secondary | ICD-10-CM

## 2020-08-09 DIAGNOSIS — E034 Atrophy of thyroid (acquired): Secondary | ICD-10-CM

## 2020-08-09 DIAGNOSIS — U071 COVID-19: Secondary | ICD-10-CM

## 2020-08-09 DIAGNOSIS — I2609 Other pulmonary embolism with acute cor pulmonale: Secondary | ICD-10-CM

## 2020-08-09 NOTE — Progress Notes (Signed)
I connected by phone with Danielle Pratt on 08/09/2020 at 3:49 PM to discuss the potential use of a new treatment for mild to moderate COVID-19 viral infection in non-hospitalized patients.  This patient is a 69 y.o. female that meets the FDA criteria for Emergency Use Authorization of COVID monoclonal antibody casirivimab/imdevimab or bamlanivimab/eteseviamb.  Has a (+) direct SARS-CoV-2 viral test result  Has mild or moderate COVID-19   Is NOT hospitalized due to COVID-19  Is within 10 days of symptom onset  Has at least one of the high risk factor(s) for progression to severe COVID-19 and/or hospitalization as defined in EUA.  Specific high risk criteria : Older age (>/= 68 yo) and Cardiovascular disease or hypertension   I have spoken and communicated the following to the patient or parent/caregiver regarding COVID monoclonal antibody treatment:  1. FDA has authorized the emergency use for the treatment of mild to moderate COVID-19 in adults and pediatric patients with positive results of direct SARS-CoV-2 viral testing who are 42 years of age and older weighing at least 40 kg, and who are at high risk for progressing to severe COVID-19 and/or hospitalization.  2. The significant known and potential risks and benefits of COVID monoclonal antibody, and the extent to which such potential risks and benefits are unknown.  3. Information on available alternative treatments and the risks and benefits of those alternatives, including clinical trials.  4. Patients treated with COVID monoclonal antibody should continue to self-isolate and use infection control measures (e.g., wear mask, isolate, social distance, avoid sharing personal items, clean and disinfect "high touch" surfaces, and frequent handwashing) according to CDC guidelines.   5. The patient or parent/caregiver has the option to accept or refuse COVID monoclonal antibody treatment.  After reviewing this information with the  patient, the patient has agreed to receive one of the available covid 19 monoclonal antibodies and will be provided an appropriate fact sheet prior to infusion.   Orma Render, NP 08/09/2020 3:49 PM

## 2020-08-10 ENCOUNTER — Ambulatory Visit (HOSPITAL_COMMUNITY)
Admission: RE | Admit: 2020-08-10 | Discharge: 2020-08-10 | Disposition: A | Discharge status: Home / Self Care | Payer: Medicare Other | Source: Ambulatory Visit | Attending: Pulmonary Disease | Admitting: Pulmonary Disease

## 2020-08-10 DIAGNOSIS — I2609 Other pulmonary embolism with acute cor pulmonale: Secondary | ICD-10-CM | POA: Insufficient documentation

## 2020-08-10 DIAGNOSIS — I82441 Acute embolism and thrombosis of right tibial vein: Secondary | ICD-10-CM | POA: Diagnosis present

## 2020-08-10 DIAGNOSIS — U071 COVID-19: Secondary | ICD-10-CM | POA: Diagnosis present

## 2020-08-10 DIAGNOSIS — I2782 Chronic pulmonary embolism: Secondary | ICD-10-CM | POA: Diagnosis present

## 2020-08-10 DIAGNOSIS — Z23 Encounter for immunization: Secondary | ICD-10-CM | POA: Diagnosis not present

## 2020-08-10 DIAGNOSIS — I2721 Secondary pulmonary arterial hypertension: Secondary | ICD-10-CM | POA: Diagnosis present

## 2020-08-10 DIAGNOSIS — E034 Atrophy of thyroid (acquired): Secondary | ICD-10-CM | POA: Insufficient documentation

## 2020-08-10 MED ORDER — ALBUTEROL SULFATE HFA 108 (90 BASE) MCG/ACT IN AERS: 2.0000 | INHALATION_SPRAY | Freq: Once | RESPIRATORY_TRACT | Status: DC | PRN

## 2020-08-10 MED ORDER — DIPHENHYDRAMINE HCL 50 MG/ML IJ SOLN: 50.0000 mg | Freq: Once | INTRAMUSCULAR | Status: DC | PRN

## 2020-08-10 MED ORDER — SODIUM CHLORIDE 0.9 % IV SOLN: INTRAVENOUS | Status: DC | PRN

## 2020-08-10 MED ORDER — FAMOTIDINE IN NACL 20-0.9 MG/50ML-% IV SOLN: 20.0000 mg | Freq: Once | INTRAVENOUS | Status: DC | PRN

## 2020-08-10 MED ORDER — METHYLPREDNISOLONE SODIUM SUCC 125 MG IJ SOLR: 125.0000 mg | Freq: Once | INTRAMUSCULAR | Status: DC | PRN

## 2020-08-10 MED ORDER — EPINEPHRINE 0.3 MG/0.3ML IJ SOAJ: 0.3000 mg | Freq: Once | INTRAMUSCULAR | Status: DC | PRN

## 2020-08-10 MED ORDER — SODIUM CHLORIDE 0.9 % IV SOLN
1200.0000 mg | Freq: Once | INTRAVENOUS | Status: AC
  Administered 2020-08-10: 1200 mg via INTRAVENOUS

## 2020-08-10 NOTE — Progress Notes (Signed)
  Diagnosis: COVID-19  Physician: Dr. Asencion Noble  Procedure: Covid Infusion Clinic Med: casirivimab\imdevimab infusion - Provided patient with casirivimab\imdevimab fact sheet for patients, parents and caregivers prior to infusion.  Complications: No immediate complications noted.  Discharge: Discharged home   Danielle Pratt 08/10/2020

## 2020-08-10 NOTE — Discharge Instructions (Signed)

## 2020-08-10 NOTE — Progress Notes (Signed)
  Diagnosis: COVID-19  Physician: Dr. Joya Gaskins  Procedure: Covid Infusion Clinic Med: casirivimab\imdevimab infusion - Provided patient with casirivimab\imdevimab fact sheet for patients, parents and caregivers prior to infusion.  Complications: No immediate complications noted.  Discharge: Discharged home   [place] 08/10/2020

## 2020-08-17 ENCOUNTER — Telehealth: Payer: Self-pay | Admitting: Internal Medicine

## 2020-08-17 NOTE — Progress Notes (Signed)
  Chronic Care Management   Outreach Note  08/17/2020 Name: Danielle Pratt MRN: [id number] DOB: 1951-06-10  Referred by: Binnie Rail, MD Reason for referral : No chief complaint on file.   An unsuccessful telephone outreach was attempted today. The patient was referred to the pharmacist for assistance with care management and care coordination.   Follow Up Plan:   Carley Perdue [employer] Scheduler

## 2020-08-23 ENCOUNTER — Telehealth: Payer: Self-pay | Admitting: Internal Medicine

## 2020-08-23 NOTE — Progress Notes (Signed)
  Chronic Care Management   Outreach Note  08/23/2020 Name: Danielle Pratt MRN: [id number] DOB: 05/06/1951  Referred by: Binnie Rail, MD Reason for referral : No chief complaint on file.   A second unsuccessful telephone outreach was attempted today. The patient was referred to pharmacist for assistance with care management and care coordination.  Follow Up Plan:   Carley Perdue [employer] Scheduler

## 2020-08-25 ENCOUNTER — Telehealth: Payer: Self-pay | Admitting: Internal Medicine

## 2020-08-25 NOTE — Progress Notes (Signed)
°  Chronic Care Management   Note  08/25/2020 Name: Danielle Pratt MRN: [id number] DOB: 24-Sep-1951  Danielle Pratt is a 69 y.o. year old female who is a primary care patient of Burns, Claudina Lick, MD. I reached out to Danielle Pratt by phone today in response to a referral sent by Danielle Pratt's PCP, Binnie Rail, MD.   Danielle Pratt was given information about Chronic Care Management services today including:  1. CCM service includes personalized support from designated clinical staff supervised by her physician, including individualized plan of care and coordination with other care providers 2. 24/7 contact phone numbers for assistance for urgent and routine care needs. 3. Service will only be billed when office clinical staff spend 20 minutes or more in a month to coordinate care. 4. Only one practitioner may furnish and bill the service in a calendar month. 5. The patient may stop CCM services at any time (effective at the end of the month) by phone call to the office staff.   Patient agreed to services and verbal consent obtained.   Follow up plan:   Danielle Pratt [employer] Scheduler

## 2020-10-11 NOTE — Patient Instructions (Addendum)
Blood work was ordered.     Medications changes include :   none  Your prescription(s) have been submitted to your pharmacy. Please take as directed and contact our office if you believe you are having problem(s) with the medication(s).    Please followup in 1 year     Health Maintenance, Female Adopting a healthy lifestyle and getting preventive care are important in promoting health and wellness. Ask your health care provider about:  The right schedule for you to have regular tests and exams.  Things you can do on your own to prevent diseases and keep yourself healthy. What should I know about diet, weight, and exercise? Eat a healthy diet   Eat a diet that includes plenty of vegetables, fruits, low-fat dairy products, and lean protein.  Do not eat a lot of foods that are high in solid fats, added sugars, or sodium. Maintain a healthy weight Body mass index (BMI) is used to identify weight problems. It estimates body fat based on height and weight. Your health care provider can help determine your BMI and help you achieve or maintain a healthy weight. Get regular exercise Get regular exercise. This is one of the most important things you can do for your health. Most adults should:  Exercise for at least 150 minutes each week. The exercise should increase your heart rate and make you sweat (moderate-intensity exercise).  Do strengthening exercises at least twice a week. This is in addition to the moderate-intensity exercise.  Spend less time sitting. Even light physical activity can be beneficial. Watch cholesterol and blood lipids Have your blood tested for lipids and cholesterol at 68 years of age, then have this test every 5 years. Have your cholesterol levels checked more often if:  Your lipid or cholesterol levels are high.  You are older than 68 years of age.  You are at high risk for heart disease. What should I know about cancer screening? Depending on your  health history and family history, you may need to have cancer screening at various ages. This may include screening for:  Breast cancer.  Cervical cancer.  Colorectal cancer.  Skin cancer.  Lung cancer. What should I know about heart disease, diabetes, and high blood pressure? Blood pressure and heart disease  High blood pressure causes heart disease and increases the risk of stroke. This is more likely to develop in people who have high blood pressure readings, are of African descent, or are overweight.  Have your blood pressure checked: ? Every 3-5 years if you are 19-68 years of age. ? Every year if you are 97 years old or older. Diabetes Have regular diabetes screenings. This checks your fasting blood sugar level. Have the screening done:  Once every three years after age 68 if you are at a normal weight and have a low risk for diabetes.  More often and at a younger age if you are overweight or have a high risk for diabetes. What should I know about preventing infection? Hepatitis B If you have a higher risk for hepatitis B, you should be screened for this virus. Talk with your health care provider to find out if you are at risk for hepatitis B infection. Hepatitis C Testing is recommended for:  Everyone born from [birth year] through 1965.  Anyone with known risk factors for hepatitis C. Sexually transmitted infections (STIs)  Get screened for STIs, including gonorrhea and chlamydia, if: ? You are sexually active and are younger than 68 years of  age. ? You are older than 68 years of age and your health care provider tells you that you are at risk for this type of infection. ? Your sexual activity has changed since you were last screened, and you are at increased risk for chlamydia or gonorrhea. Ask your health care provider if you are at risk.  Ask your health care provider about whether you are at high risk for HIV. Your health care provider may recommend a prescription  medicine to help prevent HIV infection. If you choose to take medicine to prevent HIV, you should first get tested for HIV. You should then be tested every 3 months for as long as you are taking the medicine. Pregnancy  If you are about to stop having your period (premenopausal) and you may become pregnant, seek counseling before you get pregnant.  Take 400 to 800 micrograms (mcg) of folic acid every day if you become pregnant.  Ask for birth control (contraception) if you want to prevent pregnancy. Osteoporosis and menopause Osteoporosis is a disease in which the bones lose minerals and strength with aging. This can result in bone fractures. If you are 90 years old or older, or if you are at risk for osteoporosis and fractures, ask your health care provider if you should:  Be screened for bone loss.  Take a calcium or vitamin D supplement to lower your risk of fractures.  Be given hormone replacement therapy (HRT) to treat symptoms of menopause. Follow these instructions at home: Lifestyle  Do not use any products that contain nicotine or tobacco, such as cigarettes, e-cigarettes, and chewing tobacco. If you need help quitting, ask your health care provider.  Do not use street drugs.  Do not share needles.  Ask your health care provider for help if you need support or information about quitting drugs. Alcohol use  Do not drink alcohol if: ? Your health care provider tells you not to drink. ? You are pregnant, may be pregnant, or are planning to become pregnant.  If you drink alcohol: ? Limit how much you use to 0-1 drink a day. ? Limit intake if you are breastfeeding.  Be aware of how much alcohol is in your drink. In the [place] one drink equals one 12 oz bottle of beer (355 mL), one 5 oz glass of wine (148 mL), or one 1 oz glass of hard liquor (44 mL). General instructions  Schedule regular health, dental, and eye exams.  Stay current with your vaccines.  Tell your health  care provider if: ? You often feel depressed. ? You have ever been abused or do not feel safe at home. Summary  Adopting a healthy lifestyle and getting preventive care are important in promoting health and wellness.  Follow your health care provider's instructions about healthy diet, exercising, and getting tested or screened for diseases.  Follow your health care provider's instructions on monitoring your cholesterol and blood pressure. This information is not intended to replace advice given to you by your health care provider. Make sure you discuss any questions you have with your health care provider. Document Revised: 10/21/2018 Document Reviewed: 10/21/2018 Elsevier Patient Education  2020 [employer].

## 2020-10-11 NOTE — Progress Notes (Addendum)
Subjective:    Patient ID: Danielle Pratt, female    DOB: 12-Mar-1951, 69 y.o.   MRN: [id number]  HPI   She is here for a physical exam and follow up of their chronic medical problems, including hypothyroidism, anxiety, h/o RLE DVT/ PE, PAH  She feels good overall and has no concerns.   Medications and allergies reviewed with patient and updated if appropriate.  Patient Active Problem List   Diagnosis Date Noted  . Change in stool 01/26/2020  . Thrombophlebitis of superficial veins of left lower extremity 10/22/2019  . Pain of both elbows 05/15/2018  . Skin abnormality 04/17/2018  . PAH (pulmonary artery hypertension) ([facility]) 03/02/2018  . Left leg pain 02/18/2018  . Oropharyngeal dysphagia 01/01/2018  . Hyperphosphatemia 11/14/2017  . Closed fracture of sesamoid bone of foot 06/18/2017  . Postnasal drip 12/02/2016  . Hip dislocation, right ([facility]) 02/20/2016  . DVT, RLE 01/31/16 02/20/2016  . Anxiety 02/20/2016  . Pulmonary emboli ([facility]) 01/31/2016  . Multiple pigmented nevi 07/28/2015  . Unruptured popliteal cyst 09/07/2013  . HYPERLIPIDEMIA 01/01/2010  . LEFT BUNDLE BRANCH BLOCK 01/01/2010  . OSTEOARTHRITIS 01/01/2010  . Malignant neoplasm of tonsillar fossa ([facility]) 12/27/2008  . Hypothyroidism 07/17/2007    Current Outpatient Medications on File Prior to Visit  Medication Sig Dispense Refill  . apixaban (ELIQUIS) 5 MG TABS tablet Take 1 tablet (5 mg total) by mouth 2 (two) times daily. 60 tablet 11  . clobetasol cream (TEMOVATE) 7.89 % Apply 1 application topically at bedtime as needed (for skin irritation/finger).     [name] FLUZONE HIGH-DOSE QUADRIVALENT 0.7 ML SUSY     . levothyroxine (SYNTHROID) 88 MCG tablet Take 1 tablet (88 mcg total) by mouth daily. 90 tablet 3  . pilocarpine (SALAGEN) 5 MG tablet Take 5 mg by mouth See admin instructions. Take 1 tablet (5 mg) by mouth 3 times daily - early morning, midday and bedtime, may take an additional tablet during the day as  needed for dry mouth.    . tretinoin (RETIN-A) 0.05 % cream Apply 1 application topically at bedtime.    [name] venlafaxine XR (EFFEXOR-XR) 75 MG 24 hr capsule TAKE ONE CAPSULE BY MOUTH EVERY NIGHT AT BEDTIME 90 capsule 1   No current facility-administered medications on file prior to visit.    Past Medical History:  Diagnosis Date  . Anxiety   . Arthritis    "hands" (11/12/2017)  . Cataract    extraction surgery  . DVT (deep venous thrombosis) ([facility]) 01/2016   LLE  . Fasting hyperglycemia    110 in 01/2011;131 in 05/2007  . Hypothyroidism   . Popliteal cyst   . Pulmonary embolism ([facility]) 2017; 11/11/2017   "the one in 2017 was due to LLE DVT; don't why I had the one 11/11/2017"  . Renal insufficiency   . Tonsillar cancer ([facility]) 1998    Past Surgical History:  Procedure Laterality Date  . CESAREAN SECTION  1984   G 1 P 2  . COLONOSCOPY  2003   negative; Dr Henrene Pastor  . JOINT REPLACEMENT    . LYMPHADENECTOMY Right 1998   neck  . RIGHT HEART CATH N/A 10/22/2018   Procedure: RIGHT HEART CATH;  Surgeon: Jolaine Artist, MD;  Location: [facility];  Service: Cardiovascular;  Laterality: N/A;  . RIGHT/LEFT HEART CATH AND CORONARY ANGIOGRAPHY N/A 03/02/2018   Procedure: RIGHT/LEFT HEART CATH AND CORONARY ANGIOGRAPHY;  Surgeon: Jolaine Artist, MD;  Location: [facility];  Service: Cardiovascular;  Laterality: N/A;  . TEMPORARY PACEMAKER N/A 03/02/2018   Procedure: TEMPORARY PACEMAKER;  Surgeon: Jolaine Artist, MD;  Location: [facility];  Service: Cardiovascular;  Laterality: N/A;  . TONSILLECTOMY AND ADENOIDECTOMY Right 1998   R tonsilar malignancy; subsequent LN dissection negative  . TOTAL ABDOMINAL HYSTERECTOMY  1980s   TOTAL ABDOMINAL HYSTERECTOMY W/ BILATERAL SALPINGOOPHORECTOMY [SHX83]; fibroids; Dr. Nori Riis  . TOTAL HIP ARTHROPLASTY Bilateral 2007   Dr Maureen Ralphs, both hips 3 months apart    Social History   Socioeconomic History  . Marital status: Married      Spouse name: Not on file  . Number of children: Not on file  . Years of education: Not on file  . Highest education level: Not on file  Occupational History  . Not on file  Tobacco Use  . Smoking status: Never Smoker  . Smokeless tobacco: Never Used  Vaping Use  . Vaping Use: Never used  Substance and Sexual Activity  . Alcohol use: Yes    Alcohol/week: 3.0 standard drinks    Types: 3 Cans of beer per week  . Drug use: No  . Sexual activity: Yes  Other Topics Concern  . Not on file  Social History Narrative  . Not on file   Social Determinants of Health   Financial Resource Strain:   . Difficulty of Paying Living Expenses: Not on file  Food Insecurity:   . Worried About Charity fundraiser in the Last Year: Not on file  . Ran Out of Food in the Last Year: Not on file  Transportation Needs:   . Lack of Transportation (Medical): Not on file  . Lack of Transportation (Non-Medical): Not on file  Physical Activity:   . Days of Exercise per Week: Not on file  . Minutes of Exercise per Session: Not on file  Stress:   . Feeling of Stress : Not on file  Social Connections:   . Frequency of Communication with Friends and Family: Not on file  . Frequency of Social Gatherings with Friends and Family: Not on file  . Attends Religious Services: Not on file  . Active Member of Clubs or Organizations: Not on file  . Attends Archivist Meetings: Not on file  . Marital Status: Not on file    Family History  Problem Relation Age of Onset  . Hyperlipidemia Mother   . Hypertension Mother   . Ovarian cancer Mother   . Aneurysm Mother        ascending & descending aorta  . Heart attack Father 65  . Leukemia Paternal Grandmother   . Lung cancer Paternal Grandfather   . Diabetes Neg Hx   . Stroke Neg Hx   . Colon cancer Neg Hx   . Stomach cancer Neg Hx   . Rectal cancer Neg Hx   . Breast cancer Neg Hx     Review of Systems  Constitutional: Negative for chills and  fever.  Respiratory: Positive for cough (chronic dry cough) and shortness of breath (with strenuous exercise). Negative for wheezing.   Cardiovascular: Negative for chest pain, palpitations and leg swelling.  Gastrointestinal: Negative for abdominal pain, constipation, diarrhea and nausea.  Genitourinary: Negative for dysuria.  Musculoskeletal: Positive for arthralgias.  Neurological: Positive for light-headedness (occ with standing quickly). Negative for headaches.       Objective:   Vitals:   10/12/20 0959  BP: 116/72  Pulse: 80  Temp: 98 F (36.7  C)  SpO2: 96%   BP Readings from Last 3 Encounters:  10/12/20 116/72  08/10/20 116/84  03/28/20 (!) 134/98   Wt Readings from Last 3 Encounters:  10/12/20 151 lb (68.5 kg)  01/26/20 153 lb 9.6 oz (69.7 kg)  10/22/19 154 lb 0.6 oz (69.9 kg)   Body mass index is 22.14 kg/m.   Physical Exam    Constitutional: She appears well-developed and well-nourished. No distress.  HENT:  Head: Normocephalic and atraumatic.  Mouth/Throat: Oropharynx is clear and moist.  Eyes: Conjunctivae and EOM are normal.  Neck: Neck supple. No tracheal deviation present. No thyromegaly present.  No carotid bruit  Cardiovascular: Normal rate, regular rhythm and normal heart sounds.   No murmur heard.  No edema. Pulmonary/Chest: Effort normal and breath sounds normal. No respiratory distress. She has no wheezes. She has no rales.  Abdominal: Soft. She exhibits no distension. There is no tenderness.  Lymphadenopathy: She has no cervical adenopathy.  Skin: Skin is warm and dry. She is not diaphoretic.  Psychiatric: She has a normal mood and affect. Her behavior is normal.       Assessment & Plan:    Physical exam: Screening blood work ordered Immunizations    Had flu vaccine, discussed shingrix, others up to date Colonoscopy   Due this month Mammogram  Up to date  Gyn   Up to date  Dexa  Done by gyn Eye exams  Up to date  Exercise  None -  encouraged regular exercise Weight good  Substance   none  See Problem List for Assessment and Plan of chronic medical problems.    See Problem List for Assessment and Plan of chronic medical problems.    This visit occurred during the SARS-CoV-2 public health emergency.  Safety protocols were in place, including screening questions prior to the visit, additional usage of staff PPE, and extensive cleaning of exam room while observing appropriate contact time as indicated for disinfecting solutions.

## 2020-10-12 ENCOUNTER — Ambulatory Visit (INDEPENDENT_AMBULATORY_CARE_PROVIDER_SITE_OTHER): Payer: PPO | Admitting: Internal Medicine

## 2020-10-12 ENCOUNTER — Other Ambulatory Visit: Payer: Self-pay

## 2020-10-12 ENCOUNTER — Encounter: Payer: Self-pay | Admitting: Internal Medicine

## 2020-10-12 VITALS — BP 116/72 | HR 80 | Temp 98.0°F | Ht 69.25 in | Wt 151.0 lb

## 2020-10-12 DIAGNOSIS — E034 Atrophy of thyroid (acquired): Secondary | ICD-10-CM

## 2020-10-12 DIAGNOSIS — Z0000 Encounter for general adult medical examination without abnormal findings: Secondary | ICD-10-CM | POA: Diagnosis not present

## 2020-10-12 DIAGNOSIS — E559 Vitamin D deficiency, unspecified: Secondary | ICD-10-CM | POA: Diagnosis not present

## 2020-10-12 DIAGNOSIS — F419 Anxiety disorder, unspecified: Secondary | ICD-10-CM | POA: Diagnosis not present

## 2020-10-12 LAB — CBC WITH DIFFERENTIAL/PLATELET
Basophils Absolute: 0 10*3/uL (ref 0.0–0.1)
Basophils Relative: 0.7 % (ref 0.0–3.0)
Eosinophils Absolute: 0.1 10*3/uL (ref 0.0–0.7)
Eosinophils Relative: 2.1 % (ref 0.0–5.0)
HCT: 41.8 % (ref 36.0–46.0)
Hemoglobin: 13.8 g/dL (ref 12.0–15.0)
Lymphocytes Relative: 21.5 % (ref 12.0–46.0)
Lymphs Abs: 1.1 10*3/uL (ref 0.7–4.0)
MCHC: 33 g/dL (ref 30.0–36.0)
MCV: 91.5 fl (ref 78.0–100.0)
Monocytes Absolute: 0.4 10*3/uL (ref 0.1–1.0)
Monocytes Relative: 7.9 % (ref 3.0–12.0)
Neutro Abs: 3.5 10*3/uL (ref 1.4–7.7)
Neutrophils Relative %: 67.8 % (ref 43.0–77.0)
Platelets: 281 10*3/uL (ref 150.0–400.0)
RBC: 4.57 Mil/uL (ref 3.87–5.11)
RDW: 14.2 % (ref 11.5–15.5)
WBC: 5.1 10*3/uL (ref 4.0–10.5)

## 2020-10-12 LAB — LIPID PANEL
Cholesterol: 245 mg/dL — ABNORMAL HIGH (ref 0–200)
HDL: 67.8 mg/dL (ref >39.00)
LDL Cholesterol: 164 mg/dL — ABNORMAL HIGH (ref 0–99)
NonHDL: 177.36
Total CHOL/HDL Ratio: 4
Triglycerides: 67 mg/dL (ref 0.0–149.0)
VLDL: 13.4 mg/dL (ref 0.0–40.0)

## 2020-10-12 LAB — COMPREHENSIVE METABOLIC PANEL
ALT: 12 U/L (ref 0–35)
AST: 19 U/L (ref 0–37)
Albumin: 4.5 g/dL (ref 3.5–5.2)
Alkaline Phosphatase: 80 U/L (ref 39–117)
BUN: 21 mg/dL (ref 6–23)
CO2: 31 mEq/L (ref 19–32)
Calcium: 9.5 mg/dL (ref 8.4–10.5)
Chloride: 99 mEq/L (ref 96–112)
Creatinine, Ser: 0.98 mg/dL (ref 0.40–1.20)
GFR: 59.39 mL/min — ABNORMAL LOW (ref >60.00)
Glucose, Bld: 80 mg/dL (ref 70–99)
Potassium: 4.6 mEq/L (ref 3.5–5.1)
Sodium: 136 mEq/L (ref 135–145)
Total Bilirubin: 0.5 mg/dL (ref 0.2–1.2)
Total Protein: 7.2 g/dL (ref 6.0–8.3)

## 2020-10-12 LAB — VITAMIN D 25 HYDROXY (VIT D DEFICIENCY, FRACTURES): VITD: 75.36 ng/mL (ref 30.00–100.00)

## 2020-10-12 LAB — TSH: TSH: 5.23 u[IU]/mL — ABNORMAL HIGH (ref 0.35–4.50)

## 2020-10-12 MED ORDER — VENLAFAXINE HCL ER 75 MG PO CP24: ORAL_CAPSULE | ORAL | 3 refills | Status: DC

## 2020-10-12 MED ORDER — LEVOTHYROXINE SODIUM 100 MCG PO TABS: 100.0000 ug | ORAL_TABLET | Freq: Every day | ORAL | 3 refills | Status: DC

## 2020-10-12 NOTE — Assessment & Plan Note (Signed)
Chronic  Clinically euthyroid Currently taking levothyroxine 88 mcg daily Check tsh  Titrate med dose if needed  

## 2020-10-12 NOTE — Assessment & Plan Note (Signed)
Chronic Controlled, stable Continue effexor 75 mg daily  

## 2020-10-12 NOTE — Addendum Note (Signed)
Addended by: Binnie Rail on: 10/12/2020 09:03 PM   Modules accepted: Orders

## 2020-10-12 NOTE — Assessment & Plan Note (Signed)
Chronic Taking vitamin D daily Check vitamin D level  

## 2020-10-18 NOTE — Addendum Note (Signed)
Addended by: Binnie Rail on: 10/18/2020 09:25 PM   Modules accepted: Level of Service

## 2020-10-27 ENCOUNTER — Ambulatory Visit: Payer: PPO | Admitting: Cardiovascular Disease

## 2020-11-01 ENCOUNTER — Encounter: Payer: Self-pay | Admitting: Nurse Practitioner

## 2020-11-13 ENCOUNTER — Other Ambulatory Visit: Payer: PPO

## 2020-11-13 DIAGNOSIS — Z20822 Contact with and (suspected) exposure to covid-19: Secondary | ICD-10-CM

## 2020-11-14 LAB — NOVEL CORONAVIRUS, NAA: SARS-CoV-2, NAA: NOT DETECTED

## 2020-11-14 LAB — SARS-COV-2, NAA 2 DAY TAT

## 2020-11-16 ENCOUNTER — Telehealth: Payer: Self-pay | Admitting: Pharmacist

## 2020-11-16 NOTE — Progress Notes (Signed)
Chronic Care Management Pharmacy Assistant   Name: Danielle Pratt  MRN: [id number] DOB: 08/28/1951  Reason for Encounter: Initial Questions   PCP : Pincus Sanes, MD  Allergies:   Allergies  Allergen Reactions   Omnicef [Cefdinir]     Facial swelling    Medications: Outpatient Encounter Medications as of 11/16/2020  Medication Sig Note   apixaban (ELIQUIS) 5 MG TABS tablet Take 1 tablet (5 mg total) by mouth 2 (two) times daily.    clobetasol cream (TEMOVATE) 0.05 % Apply 1 application topically at bedtime as needed (for skin irritation/finger).     FLUZONE HIGH-DOSE QUADRIVALENT 0.7 ML SUSY     levothyroxine (SYNTHROID) 100 MCG tablet Take 1 tablet (100 mcg total) by mouth daily.    pilocarpine (SALAGEN) 5 MG tablet Take 5 mg by mouth See admin instructions. Take 1 tablet (5 mg) by mouth 3 times daily - early morning, midday and bedtime, may take an additional tablet during the day as needed for dry mouth. 06/09/2013: For xerostomia post radiation, Dr Margot Ables, DDS   tretinoin (RETIN-A) 0.05 % cream Apply 1 application topically at bedtime.    venlafaxine XR (EFFEXOR-XR) 75 MG 24 hr capsule TAKE ONE CAPSULE BY MOUTH EVERY NIGHT AT BEDTIME    No facility-administered encounter medications on file as of 11/16/2020.    Current Diagnosis: Patient Active Problem List   Diagnosis Date Noted   Vitamin D deficiency 10/12/2020   Change in stool 01/26/2020   Thrombophlebitis of superficial veins of left lower extremity 10/22/2019   Pain of both elbows 05/15/2018   Skin abnormality 04/17/2018   PAH (pulmonary artery hypertension) ([facility]) 03/02/2018   Oropharyngeal dysphagia 01/01/2018   Hyperphosphatemia 11/14/2017   Closed fracture of sesamoid bone of foot 06/18/2017   Postnasal drip 12/02/2016   Hip dislocation, right ([facility]) 02/20/2016   DVT, RLE 01/31/16 02/20/2016   Anxiety 02/20/2016   Pulmonary emboli ([facility]) 01/31/2016   Multiple pigmented nevi  07/28/2015   Unruptured popliteal cyst 09/07/2013   HYPERLIPIDEMIA 01/01/2010   LEFT BUNDLE BRANCH BLOCK 01/01/2010   OSTEOARTHRITIS 01/01/2010   Malignant neoplasm of tonsillar fossa ([facility]) 12/27/2008   Hypothyroidism 07/17/2007    Goals Addressed   None     Follow-Up:  Pharmacist Review   Have you seen any other providers since your last visit? The patient states that she has not seen any other provider since Dr. Lawerance Bach  Any changes in your medications or health? The patient stated that Dr. Lawerance Bach increased her Levothyroxine to 100 mg  Any side effects from any medications? The patient is not having any side effects from medications  Do you have an symptoms or problems not managed by your medications? The patient states that she does not have any symptoms or problems that are not managed by her medications  Any concerns about your health right now? The patient states that she does not have any concerns about her health at this time  Has your provider asked that you check blood pressure, blood sugar, or follow special diet at home? The patient states that she has not been asked to check her blood pressure or sugar  Do you get any type of exercise on a regular basis? The patient states that she is active around the house  Can you think of a goal you would like to reach for your health? The patient states that her goal is to be more active  Do you have any problems getting your medications?  The patient states that she does not have any problems with getting her medications from the pharmacy  Is there anything that you would like to discuss during the appointment? The patient states that she is not at home and can't think of anything at this time  Please bring medications and supplements to appointment   Horald Pollen, Clinical Pharmacist Assistant [facility] [phone]

## 2020-11-17 ENCOUNTER — Other Ambulatory Visit: Payer: Self-pay

## 2020-11-17 ENCOUNTER — Ambulatory Visit: Payer: PPO | Admitting: Pharmacist

## 2020-11-17 DIAGNOSIS — I2609 Other pulmonary embolism with acute cor pulmonale: Secondary | ICD-10-CM

## 2020-11-17 DIAGNOSIS — E034 Atrophy of thyroid (acquired): Secondary | ICD-10-CM

## 2020-11-17 DIAGNOSIS — E782 Mixed hyperlipidemia: Secondary | ICD-10-CM

## 2020-11-17 DIAGNOSIS — I2782 Chronic pulmonary embolism: Secondary | ICD-10-CM

## 2020-11-17 NOTE — Patient Instructions (Addendum)
Visit Information  Phone number for Pharmacist: [phone]  Thank you for meeting with me to discuss your medications! I look forward to working with you to achieve your health care goals. Below is a summary of what we talked about during the visit:  Goals Addressed            This Visit's Progress   . Pharmacy Care Plan       CARE PLAN ENTRY (see longitudinal plan of care for additional care plan information)  Current Barriers:  . Chronic Disease Management support, education, and care coordination needs related to Hyperlipidemia, recurrent PE, hypothyroidism   Hyperlipidemia Lab Results  Component Value Date/Time   LDLCALC 164 (H) 10/12/2020 10:53 AM   LDLDIRECT 161.1 06/09/2013 04:03 PM .  Pharmacist Clinical Goal(s): o Over the next 365 days, patient will work with PharmD and providers to achieve LDL goal 30% reduction (~110) . Current regimen:  o No medication indicated . Interventions: o Discussed cholesterol goals and benefits of diet/exercise for prevention of heart attack / stroke . Patient self care activities - Over the next 365 days, patient will: o Reduce cholesterol in diet and increase exercise  Hx Recurrent PE . Pharmacist Clinical Goal(s) o Over the next 365 days, patient will work with PharmD and providers to optimize therapy . Current regimen:  o Eliquis 5 mg twice a day . Interventions: o Discussed indication and benefits of Eliquis . Patient self care activities - Over the next 365 days, patient will: o Monitor for signs/symptoms of bleeding  Hypothyroidism . Pharmacist Clinical Goal(s) o Over the next 365 days, patient will work with PharmD and providers to optimize therapy . Current regimen:  o Levothyroxine 100 mcg daily . Interventions: o Advised to repeat TSH in first week of February . Patient self care activities - Over the next 365 days, patient will: o Contact office to schedule repeat TSH (already ordered)  Medication  management . Pharmacist Clinical Goal(s): o Over the next 365 days, patient will work with PharmD and providers to maintain optimal medication adherence . [facility] . Interventions o Comprehensive medication review performed. o Continue current medication management strategy . Patient self care activities - Over the next 365 days, patient will: o Focus on medication adherence by fill date o Take medications as prescribed o Report any questions or concerns to PharmD and/or provider(s)  Initial goal documentation      Danielle Pratt was given information about Chronic Care Management services today including:  1. CCM service includes personalized support from designated clinical staff supervised by her physician, including individualized plan of care and coordination with other care providers 2. 24/7 contact phone numbers for assistance for urgent and routine care needs. 3. Standard insurance, coinsurance, copays and deductibles apply for chronic care management only during months in which we provide at least 20 minutes of these services. Most insurances cover these services at 100%, however patients may be responsible for any copay, coinsurance and/or deductible if applicable. This service may help you avoid the need for more expensive face-to-face services. 4. Only one practitioner may furnish and bill the service in a calendar month. 5. The patient may stop CCM services at any time (effective at the end of the month) by phone call to the office staff.  Patient agreed to services and verbal consent obtained.   The patient verbalized understanding of instructions, educational materials, and care plan provided today and agreed to receive a mailed copy of patient instructions,  educational materials, and care plan.  Telephone follow up appointment with pharmacy team member scheduled for:  Charlene Brooke, PharmD, BCACP Clinical Pharmacist [facility] [phone]  Cholesterol Content in Foods Cholesterol is a waxy, fat-like substance that helps to carry fat in the blood. The body needs cholesterol in small amounts, but too much cholesterol can cause damage to the arteries and heart. Most people should eat less than 200 milligrams (mg) of cholesterol a day. Foods with cholesterol  Cholesterol is found in animal-based foods, such as meat, seafood, and dairy. Generally, low-fat dairy and lean meats have less cholesterol than full-fat dairy and fatty meats. The milligrams of cholesterol per serving (mg per serving) of common cholesterol-containing foods are listed below. Meat and other proteins  Egg - one large whole egg has 186 mg.  Veal shank - 4 oz has 141 mg.  Lean ground [place] (93% lean) - 4 oz has 118 mg.  Fat-trimmed lamb loin - 4 oz has 106 mg.  Lean ground beef (90% lean) - 4 oz has 100 mg.  Lobster - 3.5 oz has 90 mg.  Pork loin chops - 4 oz has 86 mg.  Canned salmon - 3.5 oz has 83 mg.  Fat-trimmed beef top loin - 4 oz has 78 mg.  Frankfurter - 1 frank (3.5 oz) has 77 mg.  Crab - 3.5 oz has 71 mg.  Roasted chicken without skin, white meat - 4 oz has 66 mg.  Light bologna - 2 oz has 45 mg.  Deli-cut [place] - 2 oz has 31 mg.  Canned tuna - 3.5 oz has 31 mg.  Bacon - 1 oz has 29 mg.  Oysters and mussels (raw) - 3.5 oz has 25 mg.  Mackerel - 1 oz has 22 mg.  Trout - 1 oz has 20 mg.  Pork sausage - 1 link (1 oz) has 17 mg.  Salmon - 1 oz has 16 mg.  Tilapia - 1 oz has 14 mg. Dairy  Soft-serve ice cream -  cup (4 oz) has 103 mg.  Whole-milk yogurt - 1 cup (8 oz) has 29 mg.  Cheddar cheese - 1 oz has 28 mg.  American cheese - 1 oz has 28 mg.  Whole milk - 1 cup (8 oz) has 23 mg.  2% milk - 1 cup (8 oz) has 18 mg.  Cream cheese - 1 tablespoon (Tbsp) has 15 mg.  Cottage cheese -  cup (4 oz) has 14 mg.  Low-fat (1%) milk - 1 cup (8 oz) has 10 mg.  Sour cream - 1 Tbsp has 8.5  mg.  Low-fat yogurt - 1 cup (8 oz) has 8 mg.  Nonfat Greek yogurt - 1 cup (8 oz) has 7 mg.  Half-and-half cream - 1 Tbsp has 5 mg. Fats and oils  Cod liver oil - 1 tablespoon (Tbsp) has 82 mg.  Butter - 1 Tbsp has 15 mg.  Lard - 1 Tbsp has 14 mg.  Bacon grease - 1 Tbsp has 14 mg.  Mayonnaise - 1 Tbsp has 5-10 mg.  Margarine - 1 Tbsp has 3-10 mg. Exact amounts of cholesterol in these foods may vary depending on specific ingredients and brands. Foods without cholesterol Most plant-based foods do not have cholesterol unless you combine them with a food that has cholesterol. Foods without cholesterol include:  Grains and cereals.  Vegetables.  Fruits.  Vegetable oils, such as olive, canola, and sunflower oil.  Legumes, such as peas,  beans, and lentils.  Nuts and seeds.  Egg whites. Summary  The body needs cholesterol in small amounts, but too much cholesterol can cause damage to the arteries and heart.  Most people should eat less than 200 milligrams (mg) of cholesterol a day. This information is not intended to replace advice given to you by your health care provider. Make sure you discuss any questions you have with your health care provider. Document Revised: 10/10/2017 Document Reviewed: 06/24/2017 Elsevier Patient Education  [place].

## 2020-11-17 NOTE — Chronic Care Management (AMB) (Signed)
Chronic Care Management Pharmacy  Name: Danielle Pratt  MRN: [id number] DOB: 1951/05/01   Chief Complaint/ HPI  Danielle Pratt,  70 y.o. , female presents for her Initial CCM visit with the clinical pharmacist via telephone due to COVID-19 Pandemic.  PCP : Binnie Rail, MD Patient Care Team: Binnie Rail, MD as PCP - General (Internal Medicine) Charlton Haws, [facility] as Pharmacist (Pharmacist)  Patient's chronic conditions include: Hyperlipidemia, Hypothyroidism, Anxiety and Osteoarthritis, recurrent PE (1994, 2017, 2019), [place]  Office Visits: 10/12/20 Dr Quay Burow OV: chronic f/u , TSH elevated, increased levothyroxine to 100 mcg. Needs repeat TSH ~February  Consult Visit: 08/10/20 cov antibody infusion  04/25/20 Dr Haroldine Laws (HF): f/u for chronic PE. Underwent successful thrombo-embolectomy June 2019 @ [facility]. New dx HTN, BP normal at home, no med needed.  Subjective: Patient lives at home with spouse.   Objective: Allergies  Allergen Reactions  . Omnicef [Cefdinir]     Facial swelling    Medications: Outpatient Encounter Medications as of 11/17/2020  Medication Sig Note  . apixaban (ELIQUIS) 5 MG TABS tablet Take 1 tablet (5 mg total) by mouth 2 (two) times daily.   . clobetasol cream (TEMOVATE) 9.23 % Apply 1 application topically at bedtime as needed (for skin irritation/finger).    [name] FLUZONE HIGH-DOSE QUADRIVALENT 0.7 ML SUSY    . levothyroxine (SYNTHROID) 100 MCG tablet Take 1 tablet (100 mcg total) by mouth daily.   . pilocarpine (SALAGEN) 5 MG tablet Take 5 mg by mouth See admin instructions. Take 1 tablet (5 mg) by mouth 3 times daily - early morning, midday and bedtime, may take an additional tablet during the day as needed for dry mouth. 06/09/2013: For xerostomia post radiation, Dr Barbaraann Faster, DDS  . tretinoin (RETIN-A) 0.05 % cream Apply 1 application topically at bedtime.   [name] venlafaxine XR (EFFEXOR-XR) 75 MG 24 hr capsule TAKE ONE CAPSULE BY MOUTH EVERY  NIGHT AT BEDTIME    No facility-administered encounter medications on file as of 11/17/2020.    Wt Readings from Last 3 Encounters:  10/12/20 151 lb (68.5 kg)  01/26/20 153 lb 9.6 oz (69.7 kg)  10/22/19 154 lb 0.6 oz (69.9 kg)    Lab Results  Component Value Date   CREATININE 0.98 10/12/2020   BUN 21 10/12/2020   GFR 59.39 (L) 10/12/2020   GFRNONAA >60 10/22/2018   GFRAA >60 10/22/2018   NA 136 10/12/2020   K 4.6 10/12/2020   CALCIUM 9.5 10/12/2020   CO2 31 10/12/2020     Current Diagnosis/Assessment:    Goals Addressed   None     Recurrent PE   PE 1994, 2017, 2019 Underwent successful thrombo-embolectomy June 2019 @ [place]  Patient has tried/failed these meds in past: warfarin, Xarelto Patient is currently controlled on the following medications:  . Eliquis 5 mg BID    We discussed: indications and benefits of Eliquis; pt denies bleeding, bruising  Plan  Continue current medications  Hypothyroidism   Lab Results  Component Value Date/Time   TSH 5.23 (H) 10/12/2020 10:53 AM   TSH 8.57 (H) 10/22/2019 10:51 AM   FREET4 0.5 (L) 01/21/2007 12:00 AM   Patient has failed these meds in past: n/a Patient is currently controlled on the following medications:  . Levothyroxine 100 mcg daily  We discussed:  Recent increase in dose; pt is due for repeat TSH in first week of February  Plan  Continue current medications  Depression / Anxiety   Depression  screen [facility] 2/9 01/26/2020 11/16/2018 11/20/2017  Decreased Interest 0 1 0  Down, Depressed, Hopeless 0 1 0  PHQ - 2 Score 0 2 0  Altered sleeping - 0 -  Tired, decreased energy - 1 -  Change in appetite - 0 -  Feeling bad or failure about yourself  - 1 -  Trouble concentrating - 0 -  Moving slowly or fidgety/restless - 0 -  Suicidal thoughts - 0 -  PHQ-9 Score - 4 -  Difficult doing work/chores - Not difficult at all -  Some recent data might be hidden   No flowsheet data found.   Patient has failed  these meds in past: n/a Patient is currently controlled on the following medications:  . Venlafaxine XR 75 mg HS  We discussed: pt reports mood is good, she is happy with current dose; she does report she gets "brain zaps" if she forgets a dose; advised if she ever wants to stop medication to contact the office for long taper strategy  Plan  Continue current medications  Post radiation xerostomia   Hx tonsillar carcinoma s/p radiation (1997)  Patient has failed these meds in past: n/a Patient is currently controlled on the following medications:  . Pilocarpine 5 mg TID prn  We discussed: Pt reports improvement in dry mouth with medication, although not completely resolved  Plan  Continue current medications  Hyperlipidemia   LDL goal 30% reduction (<110)  Last lipids Lab Results  Component Value Date   CHOL 245 (H) 10/12/2020   HDL 67.80 10/12/2020   LDLCALC 164 (H) 10/12/2020   LDLDIRECT 161.1 06/09/2013   TRIG 67.0 10/12/2020   CHOLHDL 4 10/12/2020   Hepatic Function Latest Ref Rng & Units 10/12/2020 12/29/2017 11/20/2017  Total Protein 6.0 - 8.3 g/dL 7.2 7.7 7.5  Albumin 3.5 - 5.2 g/dL 4.5 4.4 4.5  AST 0 - 37 U/L '19 20 25  ' ALT 0 - 35 U/L '12 14 30  ' Alk Phosphatase 39 - 117 U/L 80 95 81  Total Bilirubin 0.2 - 1.2 mg/dL 0.5 0.5 0.5  Bilirubin, Direct 0.0 - 0.3 mg/dL - - -    The 10-year ASCVD risk score Mikey Bussing DC Jr., et al., 2013) is: 6.5%   Values used to calculate the score:     Age: 17 years     Sex: Female     Is Non-Hispanic African American: No     Diabetic: No     Tobacco smoker: No     Systolic Blood Pressure: 102 mmHg     Is BP treated: No     HDL Cholesterol: 67.8 mg/dL     Total Cholesterol: 245 mg/dL   Patient has failed these meds in past: none Patient is currently uncontrolled on the following medications:  . No medication indicated  We discussed:  diet and exercise extensively  Plan  Continue control with diet and exercise  Vaccines    Reviewed and discussed patient's vaccination history.    Immunization History  Administered Date(s) Administered  . Influenza, High Dose Seasonal PF 09/03/2018, 08/17/2019  . Influenza, Quadrivalent, Recombinant, Inj, Pf 09/10/2017  . Influenza,inj,Quad PF,6+ Mos 09/05/2014, 07/28/2015, 09/05/2016  . Influenza-Unspecified 09/05/2014, 07/28/2015, 09/05/2016, 08/11/2017  . PFIZER SARS-COV-2 Vaccination 12/02/2019, 12/23/2019  . Pneumococcal Conjugate-13 01/26/2020  . Tdap 11/11/2010, 10/12/2017, 11/11/2018   [facility] shot at [facility] Oct/Nov Covid booster yesterday  Plan   Recommended patient receive Shingrix vaccine in pharmacy and PPSV23 after 01/25/2021 in office  Health Maintenance  Patient is currently controlled on the following medications:  [name] Metamucil capsules - 3 caps daily . Vitamin D 5000 IU daily . Vitamin B12 2500 mcg  . Multivitamin   We discussed:  Patient is satisfied with current regimen and denies issues  Plan  Continue current medications  Medication Management   Patient's preferred pharmacy is:  [facility] [address] [place] [address] Phone: [phone] Fax: [phone]  Uses pill box? No - prefers bottles Pt endorses 100% compliance  We discussed: Discussed benefits of medication synchronization, packaging and delivery as well as enhanced pharmacist oversight with [facility]. Pt is happy with [facility] and will continue there  Plan  Continue current medication management strategy    Follow up: 12 month phone visit  Charlene Brooke, PharmD, BCACP Clinical Pharmacist [facility] [phone]

## 2020-11-23 ENCOUNTER — Encounter: Payer: Self-pay | Admitting: Nurse Practitioner

## 2020-11-23 ENCOUNTER — Ambulatory Visit: Payer: PPO | Admitting: Nurse Practitioner

## 2020-11-23 ENCOUNTER — Telehealth: Payer: Self-pay

## 2020-11-23 VITALS — BP 110/70 | HR 66 | Ht 69.25 in | Wt 150.6 lb

## 2020-11-23 DIAGNOSIS — Z1211 Encounter for screening for malignant neoplasm of colon: Secondary | ICD-10-CM | POA: Diagnosis not present

## 2020-11-23 DIAGNOSIS — Z7901 Long term (current) use of anticoagulants: Secondary | ICD-10-CM | POA: Diagnosis not present

## 2020-11-23 MED ORDER — SUPREP BOWEL PREP KIT 17.5-3.13-1.6 GM/177ML PO SOLN: 1.0000 | ORAL | 0 refills | Status: DC

## 2020-11-23 NOTE — Telephone Encounter (Signed)
Patient with diagnosis of DVT (2017) and PE (1994, 2017, 2019) on Eliquis for anticoagulation. Also has history of chronic thromboembolic hypertension.  Procedure: colonoscopy Date of procedure: 12/26/20  CrCl 33mL/min Platelet count 281K  Per office protocol, patient can hold Eliquis for 1 day prior to procedure given history of recurrent VTE. Will need to resume as soon as safely possible afterwards.

## 2020-11-23 NOTE — Telephone Encounter (Signed)
Request for surgical clearance:     Endoscopy Procedure  What type of surgery is being performed?     Colonoscopy  When is this surgery scheduled?     12/26/20  What type of clearance is required ?   Pharmacy  Are there any medications that need to be held prior to surgery and how long? 2 day hold Eliquis  Practice name and name of physician performing surgery?      [facility]  What is your office phone and fax number?      Phone- [phone]  [phone]  Anesthesia type (None, local, MAC, general) ?       MAC

## 2020-11-23 NOTE — Progress Notes (Signed)
ASSESSMENT AND PLAN    # 70 yo of female with history of a small adenomatous colon polyp on last colonoscopy in 2016. She is here to discuss 5 year recall colonoscopy. Based on update polyp surveillance guidleines we could probably extend next colonoscopy out to 7 years. However she does report occasional rectal bleeding with BMs so not unreasonable to proceed with originally planned 5 year recall. --Patient will be scheduled for a colonoscopy off Eliquis. The risks and benefits of colonoscopy with possible polypectomy / biopsies were discussed and the patient agrees to proceed.   # Chronic anticoagulation. Hx of recurrent PE,s/p thrombo-embolectomy in 2019. On Eliquis. Hold Eliquis for 2 days before procedure - will instruct when and how to resume after procedure. Patient understands that there is a low but real risk of cardiovascular event such as heart attack, stroke, or embolism /  thrombosis while off blood thinner. The patient consents to proceed. Will communicate by phone or EMR with patient's prescribing provider to confirm that holding Eliquis is reasonable in this case.        HISTORY OF PRESENT ILLNESS     Primary Gastroenterologist : Scarlette Shorts, MD   Chief Complaint : hx of colon polyps  Danielle Pratt is a 70 y.o. female with PMH / [place] significant for,  but not necessarily limited to: adenomatous colon polyps. COVID19, DVT, recurrent PE on Eliquis. Hypothyroidism, hysterectomy  Patient known to Dr. Henrene Pastor for history of adenomatous colon polyps in 2016. She comes in to arrange for recommended 5 year follow up colonoscopy. Several months ago she developed problems with fecal incontinence. She started Metamucil and problem resolved. She does endorse occassional rectal bleeding with bowel movements. No other Gi complaints . Recent hgb normal at 13.   Patient has a history f recurrent PE, she underwent thrombo-embolectomy at [facility] in 2019   Previous Endoscopic Evaluations  / Pertinent Studies:   December 2016 colonoscopy Single 5 mm polyp was found in the sigmoid colon; polypectomy was performed with a cold snare.  The examination was otherwise normal. Path c/w tubular adenoma  Data Review:  10/12/20 Hgb 13.8 CMP normal  Past Medical History:  Diagnosis Date  . Anxiety   . Arthritis    "hands" (11/12/2017)  . Cataract    extraction surgery  . DVT (deep venous thrombosis) ([place]) 01/2016   LLE  . Fasting hyperglycemia    110 in 01/2011;131 in 05/2007  . Hypothyroidism   . Popliteal cyst   . Pulmonary embolism ([place]) 2017; 11/11/2017   "the one in 2017 was due to LLE DVT; don't why I had the one 11/11/2017"  . Renal insufficiency   . Tonsillar cancer ([place]) 1998     Past Surgical History:  Procedure Laterality Date  . CESAREAN SECTION  1984   G 1 P 2  . COLONOSCOPY  2003   negative; Dr Henrene Pastor  . JOINT REPLACEMENT    . LYMPHADENECTOMY Right 1998   neck  . RIGHT HEART CATH N/A 10/22/2018   Procedure: RIGHT HEART CATH;  Surgeon: Jolaine Artist, MD;  Location: [facility] [facility];  Service: Cardiovascular;  Laterality: N/A;  . RIGHT/LEFT HEART CATH AND CORONARY ANGIOGRAPHY N/A 03/02/2018   Procedure: RIGHT/LEFT HEART CATH AND CORONARY ANGIOGRAPHY;  Surgeon: Jolaine Artist, MD;  Location: [place] [facility];  Service: Cardiovascular;  Laterality: N/A;  . TEMPORARY PACEMAKER N/A 03/02/2018   Procedure: TEMPORARY PACEMAKER;  Surgeon: Jolaine Artist, MD;  Location:  [place] [facility];  Service: Cardiovascular;  Laterality: N/A;  . TONSILLECTOMY AND ADENOIDECTOMY Right 1998   R tonsilar malignancy; subsequent LN dissection negative  . TOTAL ABDOMINAL HYSTERECTOMY  1980s   TOTAL ABDOMINAL HYSTERECTOMY W/ BILATERAL SALPINGOOPHORECTOMY [SHX83]; fibroids; Dr. Nori Riis  . TOTAL HIP ARTHROPLASTY Bilateral 2007   Dr Maureen Ralphs, both hips 3 months apart   Family History  Problem Relation Age of Onset  . Hyperlipidemia Mother   . Hypertension Mother   .  Ovarian cancer Mother   . Aneurysm Mother        ascending & descending aorta  . Heart attack Father 47  . Leukemia Paternal Grandmother   . Lung cancer Paternal Grandfather   . Diabetes Neg Hx   . Stroke Neg Hx   . Colon cancer Neg Hx   . Stomach cancer Neg Hx   . Rectal cancer Neg Hx   . Breast cancer Neg Hx    Social History   Tobacco Use  . Smoking status: Never Smoker  . Smokeless tobacco: Never Used  Vaping Use  . Vaping Use: Never used  Substance Use Topics  . Alcohol use: Yes    Alcohol/week: 3.0 standard drinks    Types: 3 Cans of beer per week  . Drug use: No   Current Outpatient Medications  Medication Sig Dispense Refill  . apixaban (ELIQUIS) 5 MG TABS tablet Take 1 tablet (5 mg total) by mouth 2 (two) times daily. 60 tablet 11  . clobetasol cream (TEMOVATE) 1.61 % Apply 1 application topically at bedtime as needed (for skin irritation/finger).     [name] levothyroxine (SYNTHROID) 100 MCG tablet Take 1 tablet (100 mcg total) by mouth daily. 90 tablet 3  . pilocarpine (SALAGEN) 5 MG tablet Take 5 mg by mouth See admin instructions. Take 1 tablet (5 mg) by mouth 3 times daily - early morning, midday and bedtime, may take an additional tablet during the day as needed for dry mouth.    . tretinoin (RETIN-A) 0.05 % cream Apply 1 application topically at bedtime.    [name] venlafaxine XR (EFFEXOR-XR) 75 MG 24 hr capsule TAKE ONE CAPSULE BY MOUTH EVERY NIGHT AT BEDTIME 90 capsule 3   No current facility-administered medications for this visit.   Allergies  Allergen Reactions  . Omnicef [Cefdinir]     Facial swelling     Review of Systems: All systems reviewed and negative except where noted in HPI.   PHYSICAL EXAM :    Wt Readings from Last 3 Encounters:  11/23/20 150 lb 9.6 oz (68.3 kg)  10/12/20 151 lb (68.5 kg)  01/26/20 153 lb 9.6 oz (69.7 kg)    Ht 5' 9.25" (1.759 m)   Wt 150 lb 9.6 oz (68.3 kg)   BMI 22.08 kg/m  Constitutional:  Pleasant female in no acute  distress. Psychiatric: Normal mood and affect. Behavior is normal. EENT: Pupils normal.  Conjunctivae are normal. No scleral icterus. Neck supple.  Cardiovascular: Normal rate, regular rhythm. No edema Pulmonary/chest: Effort normal and breath sounds normal. No wheezing, rales or rhonchi. Abdominal: Soft, nondistended, nontender. Bowel sounds active throughout. There are no masses palpable. No hepatomegaly. Neurological: Alert and oriented to person place and time. Skin: Skin is warm and dry. No rashes noted.  Tye Savoy, NP  11/23/2020, 8:41 AM

## 2020-11-23 NOTE — Patient Instructions (Signed)
If you are age 69 or older, your body mass index should be between 23-30. Your Body mass index is 22.08 kg/m. If this is out of the aforementioned range listed, please consider follow up with your Primary Care Provider.  If you are age 4 or younger, your body mass index should be between 19-25. Your Body mass index is 22.08 kg/m. If this is out of the aformentioned range listed, please consider follow up with your Primary Care Provider.    You have been scheduled for a colonoscopy. Please follow written instructions given to you at your visit today.  Please pick up your prep supplies at the pharmacy within the next 1-3 days. If you use inhalers (even only as needed), please bring them with you on the day of your procedure.    It was great seeing you today!  Thank you for entrusting me with your care and choosing [facility] .  Tye Savoy, NP

## 2020-11-23 NOTE — Telephone Encounter (Signed)
Clinical pharmacist to review Eliquis 

## 2020-11-26 ENCOUNTER — Encounter: Payer: Self-pay | Admitting: Nurse Practitioner

## 2020-11-26 NOTE — Progress Notes (Signed)
Assessment and plan as outlined noted.

## 2020-12-19 ENCOUNTER — Telehealth: Payer: Self-pay | Admitting: *Deleted

## 2020-12-19 NOTE — Telephone Encounter (Signed)
Forwarding after hours call note to radiation oncology office on-call provider.   Danielle Pratt reported to Periodontist she received radiation 25 years ago, in [place], [place] under [facility] radiation group.   Peridontist requesting approval for needed extraction of bad tooth due to patient's history of tonsillar cancer, resection and radiation.  Notes within current EMR in 1997 or 1998 right Tonsillectomy and Adenoidectomy surgery and radiation per Dr. Barrie Folk, ENT.  Pilocarpine 5 mg tablet for post radiation xerostomia historical medication noted on medication list

## 2020-12-26 ENCOUNTER — Encounter: Payer: PPO | Admitting: Internal Medicine

## 2021-01-10 ENCOUNTER — Ambulatory Visit (AMBULATORY_SURGERY_CENTER): Payer: PPO | Admitting: Internal Medicine

## 2021-01-10 ENCOUNTER — Encounter: Payer: Self-pay | Admitting: Internal Medicine

## 2021-01-10 ENCOUNTER — Other Ambulatory Visit: Payer: Self-pay

## 2021-01-10 VITALS — BP 127/75 | HR 80 | Temp 97.1°F | Resp 17 | Ht 69.0 in | Wt 150.0 lb

## 2021-01-10 DIAGNOSIS — Z1211 Encounter for screening for malignant neoplasm of colon: Secondary | ICD-10-CM | POA: Diagnosis not present

## 2021-01-10 DIAGNOSIS — D123 Benign neoplasm of transverse colon: Secondary | ICD-10-CM

## 2021-01-10 DIAGNOSIS — D125 Benign neoplasm of sigmoid colon: Secondary | ICD-10-CM | POA: Diagnosis not present

## 2021-01-10 DIAGNOSIS — Z860100 Personal history of colon polyps, unspecified: Secondary | ICD-10-CM

## 2021-01-10 DIAGNOSIS — D120 Benign neoplasm of cecum: Secondary | ICD-10-CM | POA: Diagnosis not present

## 2021-01-10 MED ORDER — SODIUM CHLORIDE 0.9 % IV SOLN: 500.0000 mL | Freq: Once | INTRAVENOUS | Status: DC

## 2021-01-10 NOTE — Op Note (Signed)
[place] Patient Name: Danielle Pratt Procedure Date: 01/10/2021 1:27 PM MRN: [id number] Endoscopist: Docia Chuck. Henrene Pastor , MD Age: 70 Referring MD:  Date of Birth: 11-Sep-1951 Gender: Female Account #: [id number] Procedure:                Colonoscopy with cold snare polypectomy x 3 Indications:              High risk colon cancer surveillance: Personal                            history of non-advanced adenoma. Previous                            examinations 2006 (negative for neoplasia); 2016 Medicines:                Monitored Anesthesia Care Procedure:                Pre-Anesthesia Assessment:                           - Prior to the procedure, a History and Physical                            was performed, and patient medications and                            allergies were reviewed. The patient's tolerance of                            previous anesthesia was also reviewed. The risks                            and benefits of the procedure and the sedation                            options and risks were discussed with the patient.                            All questions were answered, and informed consent                            was obtained. Prior Anticoagulants: The patient has                            taken Eliquis (apixaban), last dose was 1 day prior                            to procedure. ASA Grade Assessment: III - A patient                            with severe systemic disease. After reviewing the                            risks and benefits, the patient was deemed in  satisfactory condition to undergo the procedure.                           After obtaining informed consent, the colonoscope                            was passed under direct vision. Throughout the                            procedure, the patient's blood pressure, pulse, and                            oxygen saturations were monitored continuously. The                             Olympus CF-HQ190 [id number]) Colonoscope was                            introduced through the anus and advanced to the the                            cecum, identified by appendiceal orifice and                            ileocecal valve. The ileocecal valve, appendiceal                            orifice, and rectum were photographed. The quality                            of the bowel preparation was excellent. The                            colonoscopy was performed without difficulty. The                            patient tolerated the procedure well. The bowel                            preparation used was SUPREP via split dose                            instruction. Scope In: 1:48:41 PM Scope Out: 2:07:13 PM Scope Withdrawal Time: 0 hours 10 minutes 56 seconds  Total Procedure Duration: 0 hours 18 minutes 32 seconds  Findings:                 Three polyps were found in the sigmoid colon,                            transverse colon and cecum. The polyps were 1 to 3                            mm in size. These polyps were removed with a cold  snare. Resection and retrieval were complete.                           The exam was otherwise without abnormality on                            direct and retroflexion views. There was acute                            angulation in the rectosigmoid region Complications:            No immediate complications. Estimated blood loss:                            None. Estimated Blood Loss:     Estimated blood loss: none. Impression:               - Three 1 to 3 mm polyps in the sigmoid colon, in                            the transverse colon and in the cecum, removed with                            a cold snare. Resected and retrieved.                           - The examination was otherwise normal on direct                            and retroflexion views. It was acute angulation in                             the rectosigmoid region. Recommendation:           - Repeat colonoscopy in 5-10 years for                            surveillance, based on final pathology. PEDIATRIC                            SCOPE                           - Resume Eliquis (apixaban) today at prior dose.                           - Patient has a contact number available for                            emergencies. The signs and symptoms of potential                            delayed complications were discussed with the                            patient.  Return to normal activities tomorrow.                            Written discharge instructions were provided to the                            patient.                           - Resume previous diet.                           - Continue present medications.                           - Await pathology results. Docia Chuck. Henrene Pastor, MD 01/10/2021 2:16:38 PM This report has been signed electronically.

## 2021-01-10 NOTE — Patient Instructions (Signed)
Please read handouts provided. Continue present medications. Await pathology results. Resume Eliquis ( apixaban ) today at prior dose.     YOU HAD AN ENDOSCOPIC PROCEDURE TODAY AT [facility]:   Refer to the procedure report that was given to you for any specific questions about what was found during the examination.  If the procedure report does not answer your questions, please call your gastroenterologist to clarify.  If you requested that your care partner not be given the details of your procedure findings, then the procedure report has been included in a sealed envelope for you to review at your convenience later.  YOU SHOULD EXPECT: Some feelings of bloating in the abdomen. Passage of more gas than usual.  Walking can help get rid of the air that was put into your GI tract during the procedure and reduce the bloating. If you had a lower endoscopy (such as a colonoscopy or flexible sigmoidoscopy) you may notice spotting of blood in your stool or on the toilet paper. If you underwent a bowel prep for your procedure, you may not have a normal bowel movement for a few days.  Please Note:  You might notice some irritation and congestion in your nose or some drainage.  This is from the oxygen used during your procedure.  There is no need for concern and it should clear up in a day or so.  SYMPTOMS TO REPORT IMMEDIATELY:   Following lower endoscopy (colonoscopy or flexible sigmoidoscopy):  Excessive amounts of blood in the stool  Significant tenderness or worsening of abdominal pains  Swelling of the abdomen that is new, acute  Fever of 100F or higher   For urgent or emergent issues, a gastroenterologist can be reached at any hour by calling [phone]. Do not use MyChart messaging for urgent concerns.    DIET:  We do recommend a small meal at first, but then you may proceed to your regular diet.  Drink plenty of fluids but you should avoid alcoholic beverages for 24  hours.  ACTIVITY:  You should plan to take it easy for the rest of today and you should NOT DRIVE or use heavy machinery until tomorrow (because of the sedation medicines used during the test).    FOLLOW UP: Our staff will call the number listed on your records 48-72 hours following your procedure to check on you and address any questions or concerns that you may have regarding the information given to you following your procedure. If we do not reach you, we will leave a message.  We will attempt to reach you two times.  During this call, we will ask if you have developed any symptoms of COVID 19. If you develop any symptoms (ie: fever, flu-like symptoms, shortness of breath, cough etc.) before then, please call [phone].  If you test positive for Covid 19 in the 2 weeks post procedure, please call and report this information to [place].    If any biopsies were taken you will be contacted by phone or by letter within the next 1-3 weeks.  Please call us at [phone] if you have not heard about the biopsies in 3 weeks.    SIGNATURES/CONFIDENTIALITY: You and/or your care partner have signed paperwork which will be entered into your electronic medical record.  These signatures attest to the fact that that the information above on your After Visit Summary has been reviewed and is understood.  Full responsibility of the confidentiality of this discharge information lies  with you and/or your care-partner.

## 2021-01-10 NOTE — Progress Notes (Signed)
VS by CW  I have reviewed the patient's medical history in detail and updated the computerized patient record.  

## 2021-01-10 NOTE — Progress Notes (Signed)
Report to PACU, RN, vss, BBS= Clear.  

## 2021-01-12 ENCOUNTER — Telehealth: Payer: Self-pay

## 2021-01-12 NOTE — Telephone Encounter (Signed)
Second post procedure follow up call, no answer 

## 2021-01-12 NOTE — Telephone Encounter (Signed)
First post procedure follow up call, no answer 

## 2021-01-18 ENCOUNTER — Encounter: Payer: Self-pay | Admitting: Internal Medicine

## 2021-01-19 ENCOUNTER — Telehealth: Payer: Self-pay | Admitting: Pharmacist

## 2021-01-19 NOTE — Chronic Care Management (AMB) (Signed)
    Chronic Care Management Pharmacy Assistant   Name: Danielle Pratt  MRN: [id number] DOB: Jun 22, 1951  Reason for Encounter: Disease State-General    Medications: Outpatient Encounter Medications as of 01/19/2021  Medication Sig Note  . apixaban (ELIQUIS) 5 MG TABS tablet Take 1 tablet (5 mg total) by mouth 2 (two) times daily.   . Cholecalciferol (VITAMIN D3) 125 MCG (5000 UT) CAPS Take 1 capsule by mouth daily.   . clobetasol cream (TEMOVATE) 8.11 % Apply 1 application topically at bedtime as needed (for skin irritation/finger).    . Cyanocobalamin (VITAMIN B-12 PO) Take 2,500 mcg by mouth daily.   [name] levothyroxine (SYNTHROID) 100 MCG tablet Take 1 tablet (100 mcg total) by mouth daily.   . pilocarpine (SALAGEN) 5 MG tablet Take 5 mg by mouth See admin instructions. Take 1 tablet (5 mg) by mouth 3 times daily - early morning, midday and bedtime, may take an additional tablet during the day as needed for dry mouth. 06/09/2013: For xerostomia post radiation, Dr Barbaraann Faster, DDS  . tretinoin (RETIN-A) 0.05 % cream Apply 1 application topically at bedtime.   [name] venlafaxine XR (EFFEXOR-XR) 75 MG 24 hr capsule TAKE ONE CAPSULE BY MOUTH EVERY NIGHT AT BEDTIME    No facility-administered encounter medications on file as of 01/19/2021.      Star Rating Drugs: N/A   Have you had any problems recently with your health? Patient states she has not had any recent health concerns.  Have you had any problems with your pharmacy? Patient states she does not have any problems with her pharmacy  What issues or side effects are you having with your medications? Patient states she is not having any side effects.  What would you like me to pass along to [facility] for them to help you with?  Patient states she does not have any concerns at this time.  What can we do to take care of you better? Patient states there is nothing at this time.  Patient requested to be called back by a  scheduler to schedule her thyroid labs.   Georgiana Shore ,[place] Pharmacist Assistant [phone]

## 2021-01-24 NOTE — Telephone Encounter (Signed)
Appointment made

## 2021-01-26 ENCOUNTER — Other Ambulatory Visit: Payer: Self-pay

## 2021-01-26 ENCOUNTER — Other Ambulatory Visit (INDEPENDENT_AMBULATORY_CARE_PROVIDER_SITE_OTHER): Payer: PPO

## 2021-01-26 ENCOUNTER — Ambulatory Visit: Payer: PPO | Admitting: Cardiovascular Disease

## 2021-01-26 DIAGNOSIS — E034 Atrophy of thyroid (acquired): Secondary | ICD-10-CM | POA: Diagnosis not present

## 2021-01-26 LAB — TSH: TSH: 1.07 u[IU]/mL (ref 0.35–4.50)

## 2021-01-29 ENCOUNTER — Other Ambulatory Visit: Payer: Self-pay | Admitting: Obstetrics & Gynecology

## 2021-01-29 DIAGNOSIS — Z1231 Encounter for screening mammogram for malignant neoplasm of breast: Secondary | ICD-10-CM

## 2021-02-14 ENCOUNTER — Ambulatory Visit (INDEPENDENT_AMBULATORY_CARE_PROVIDER_SITE_OTHER): Payer: Self-pay | Admitting: Dentistry

## 2021-02-14 ENCOUNTER — Other Ambulatory Visit: Payer: Self-pay

## 2021-02-14 VITALS — BP 134/78 | HR 87 | Temp 98.2°F

## 2021-02-14 DIAGNOSIS — K029 Dental caries, unspecified: Secondary | ICD-10-CM

## 2021-02-14 DIAGNOSIS — Z0120 Encounter for dental examination and cleaning without abnormal findings: Secondary | ICD-10-CM

## 2021-02-14 DIAGNOSIS — Z8719 Personal history of other diseases of the digestive system: Secondary | ICD-10-CM

## 2021-02-14 DIAGNOSIS — Z7901 Long term (current) use of anticoagulants: Secondary | ICD-10-CM

## 2021-02-14 DIAGNOSIS — C090 Malignant neoplasm of tonsillar fossa: Secondary | ICD-10-CM

## 2021-02-14 DIAGNOSIS — K08109 Complete loss of teeth, unspecified cause, unspecified class: Secondary | ICD-10-CM

## 2021-02-14 NOTE — Progress Notes (Signed)
Department of Dental Medicine     LIMITED EXAM  Service Date:   02/14/2021  Patient Name:  Danielle Pratt Date of Birth:   [birth year] Medical Record Number: [id number]  Referring Provider:              Eppie Gibson, MD   PLAN & RECOMMENDATIONS  RECOMMENDATIONS > Discussion and consultation completed today regarding the patient's history of head and neck radiation per medical team's request. >> Recommend the patient return to her periodontist for extraction of tooth #30.  I plan to send their office all of the information from today's visit and our discussion.  Explained to the patient that I am happy to speak with her periodontist if they have any remaining concerns or questions after receiving this information.  >>  Discussed in detail all treatment options with the patient and they are agreeable to the plan.   Thank you for consulting with Hospital Dentistry and for the opportunity to participate in this patient's treatment.  Should you have any questions or concerns, please contact the [facility] at [phone].  02/14/2021      PROGRESS NOTE   COVID 19 SCREENING: The patient denies symptoms concerning for COVID-19 infection including fever, chills, cough, or newly developed shortness of breath.   HISTORY OF PRESENT ILLNESS: > Danielle Pratt is a very pleasant 70 y.o. female with h/o anxiety disorder, DVT, long-term anticoagulation (on Eliquis), osteoarthritis, hyperlipidemia, vitamin D deficiency, hypothyroidism and tonsil cancer treated with radiation therapy who presents today for a limited exam to evaluate risks and management options for the patient who is anticipating extraction of lower right molar by her periodontist.  >> Unfortunately, the patient has been unable to obtain a copy of her medical records from when she completed radiation therapy for tonsil cancer from 10/1996-12/1996, and her dentist is requesting more information prior to extracting  her tooth.  Dr. Isidore Moos, MD and Dr. Sondra Come, MD (radiation oncologists) have provided their opinion on the patient's treatment plan based on her diagnosis and the timeline she received radiation of a dose of at least 63 Gray bilaterally to posterior teeth roots in the mandible.  DENTAL HISTORY: > The patient reports that she has a Pharmacist, community and a periodontist that she sees regularly.  She reports needing a tooth on the lower right extracted (points to tooth #30), but due to her h/o head and neck radiation in 1997 her periodontist will not pull the tooth until having more information on her history.  The patient states that she has been having trouble with the hospital in locating her medical records and is unsure how to proceed.  Hospital dentistry was contacted to see if we were somehow able to assist the patient after having a discussion with Drs. Isidore Moos and [place] about their prediction on the amount of radiation and where it was received.   >> The patient currently denies any dental/oral pain or sensitivity, however she expresses desire in having her infected tooth out before it becomes painful or more problematic.   CHIEF COMPLAINT:  Here for a dental consultation regarding previous radiation to the head and neck.   Patient Active Problem List   Diagnosis Date Noted  . Vitamin D deficiency 10/12/2020  . Change in stool 01/26/2020  . Thrombophlebitis of superficial veins of left lower extremity 10/22/2019  . Pain of both elbows 05/15/2018  . Skin abnormality 04/17/2018  . PAH (pulmonary artery hypertension) ([place]) 03/02/2018  . Oropharyngeal dysphagia 01/01/2018  .  Hyperphosphatemia 11/14/2017  . Closed fracture of sesamoid bone of foot 06/18/2017  . Postnasal drip 12/02/2016  . Hip dislocation, right ([place]) 02/20/2016  . DVT, RLE 01/31/16 02/20/2016  . Anxiety 02/20/2016  . Pulmonary emboli ([place]) 01/31/2016  . Multiple pigmented nevi 07/28/2015  . Unruptured popliteal cyst 09/07/2013  .  Hyperlipidemia, mixed 01/01/2010  . LEFT BUNDLE BRANCH BLOCK 01/01/2010  . OSTEOARTHRITIS 01/01/2010  . Malignant neoplasm of tonsillar fossa ([place]) 12/27/2008  . Hypothyroidism 07/17/2007   Past Medical History:  Diagnosis Date  . Anxiety   . Arthritis    "hands" (11/12/2017)  . Cataract    extraction surgery  . DVT (deep venous thrombosis) ([place]) 01/2016   LLE  . Fasting hyperglycemia    110 in 01/2011;131 in 05/2007  . Hypothyroidism   . Popliteal cyst   . Pulmonary embolism ([place]) 2017; 11/11/2017   "the one in 2017 was due to LLE DVT; don't why I had the one 11/11/2017"  . Renal insufficiency   . Tonsillar cancer ([place]) 1998   Past Surgical History:  Procedure Laterality Date  . CESAREAN SECTION  1984   G 1 P 2  . COLONOSCOPY  2003   negative; Dr Henrene Pastor  . LYMPHADENECTOMY Right 1998   neck  . PULMONARY EMBOLISM SURGERY    . RIGHT HEART CATH N/A 10/22/2018   Procedure: RIGHT HEART CATH;  Surgeon: Jolaine Artist, MD;  Location: [facility];  Service: Cardiovascular;  Laterality: N/A;  . RIGHT/LEFT HEART CATH AND CORONARY ANGIOGRAPHY N/A 03/02/2018   Procedure: RIGHT/LEFT HEART CATH AND CORONARY ANGIOGRAPHY;  Surgeon: Jolaine Artist, MD;  Location: [facility];  Service: Cardiovascular;  Laterality: N/A;  . TEMPORARY PACEMAKER N/A 03/02/2018   Procedure: TEMPORARY PACEMAKER;  Surgeon: Jolaine Artist, MD;  Location: [facility];  Service: Cardiovascular;  Laterality: N/A;  . TONSILLECTOMY AND ADENOIDECTOMY Right 1998   R tonsilar malignancy; subsequent LN dissection negative  . TOTAL ABDOMINAL HYSTERECTOMY  1980s   TOTAL ABDOMINAL HYSTERECTOMY W/ BILATERAL SALPINGOOPHORECTOMY [SHX83]; fibroids; Dr. Nori Riis  . TOTAL HIP ARTHROPLASTY Bilateral 2007   Dr Maureen Ralphs, both hips 3 months apart   Allergies  Allergen Reactions  . Omnicef [Cefdinir]     Facial swelling   Current Outpatient Medications  Medication Sig Dispense Refill  . apixaban (ELIQUIS) 5  MG TABS tablet Take 1 tablet (5 mg total) by mouth 2 (two) times daily. 60 tablet 11  . Cholecalciferol (VITAMIN D3) 125 MCG (5000 UT) CAPS Take 1 capsule by mouth daily.    . clobetasol cream (TEMOVATE) 4.13 % Apply 1 application topically at bedtime as needed (for skin irritation/finger).     . Cyanocobalamin (VITAMIN B-12 PO) Take 2,500 mcg by mouth daily.    [name] levothyroxine (SYNTHROID) 100 MCG tablet Take 1 tablet (100 mcg total) by mouth daily. 90 tablet 3  . pilocarpine (SALAGEN) 5 MG tablet Take 5 mg by mouth See admin instructions. Take 1 tablet (5 mg) by mouth 3 times daily - early morning, midday and bedtime, may take an additional tablet during the day as needed for dry mouth.    . tretinoin (RETIN-A) 0.05 % cream Apply 1 application topically at bedtime.    [name] venlafaxine XR (EFFEXOR-XR) 75 MG 24 hr capsule TAKE ONE CAPSULE BY MOUTH EVERY NIGHT AT BEDTIME 90 capsule 3   No current facility-administered medications for this visit.    LABS: Lab Results  Component Value Date   WBC 5.1  10/12/2020   HGB 13.8 10/12/2020   HCT 41.8 10/12/2020   MCV 91.5 10/12/2020   PLT 281.0 10/12/2020      Component Value Date/Time   NA 136 10/12/2020 1053   K 4.6 10/12/2020 1053   CL 99 10/12/2020 1053   CO2 31 10/12/2020 1053   GLUCOSE 80 10/12/2020 1053   BUN 21 10/12/2020 1053   CREATININE 0.98 10/12/2020 1053   CREATININE 0.93 12/29/2017 1523   CALCIUM 9.5 10/12/2020 1053   GFRNONAA >60 10/22/2018 0913   GFRNONAA >60 12/29/2017 1523   GFRAA >60 10/22/2018 0913   GFRAA >60 12/29/2017 1523   Lab Results  Component Value Date   INR 1.15 02/25/2018   INR 1.12 11/12/2017   INR 1.8 (H) 06/06/2007   No results found for: PTT  Social History   Socioeconomic History  . Marital status: Married    Spouse name: Not on file  . Number of children: Not on file  . Years of education: Not on file  . Highest education level: Not on file  Occupational History  . Not on file  Tobacco Use   . Smoking status: Never Smoker  . Smokeless tobacco: Never Used  Vaping Use  . Vaping Use: Never used  Substance and Sexual Activity  . Alcohol use: Yes    Alcohol/week: 3.0 standard drinks    Types: 3 Cans of beer per week  . Drug use: No  . Sexual activity: Yes  Other Topics Concern  . Not on file  Social History Narrative  . Not on file   Social Determinants of Health   Financial Resource Strain: Low Risk   . Difficulty of Paying Living Expenses: Not hard at all  Food Insecurity: Not on file  Transportation Needs: Not on file  Physical Activity: Not on file  Stress: Not on file  Social Connections: Not on file  Intimate Partner Violence: Not on file   Family History  Problem Relation Age of Onset  . Hyperlipidemia Mother   . Hypertension Mother   . Ovarian cancer Mother   . Aneurysm Mother        ascending & descending aorta  . Heart attack Father 51  . Leukemia Paternal Grandmother   . Lung cancer Paternal Grandfather   . Diabetes Neg Hx   . Stroke Neg Hx   . Colon cancer Neg Hx   . Stomach cancer Neg Hx   . Rectal cancer Neg Hx   . Breast cancer Neg Hx     REVIEW OF SYSTEMS: Reviewed with the patient as per HPI. PSYCH: Patient denies having dental phobia.   VITAL SIGNS: BP 134/78 (BP Location: Right Arm)   Pulse 87   Temp 98.2 F (36.8 C) (Oral)    PHYSICAL EXAM: >> General:  Well-developed, comfortable and in no apparent distress. >> Neurological:  Alert and oriented to person, place and  time. >> Extraoral:  Facial symmetry present without any edema or erythema.  No swelling or lymphadenopathy.  TMJ asymptomatic without clicks or crepitations. >> Intraoral:  Soft tissues appear well-perfused and mucous membranes moist.  FOM and vestibules soft and not raised. Oral cavity without mass or lesion. No signs of infection, parulis, sinus tract, edema or erythema evident upon exam.    DENTAL EXAM: Limited hard tissue exam completed and charted (lower  right quadrant).  >> Dentition:  Overall good remaining dentition.  Missing teeth, caries, existing restorations.   >> The patient is maintaining good oral hygiene.   >>  Periodontal: Pink, healthy gingival tissue with blunted papilla.  Localized gingival recession. >> Caries: #30 distal root caries extending from buccal to lingual >> Defective Restorations: #30 existing crown with recurrent decay under margins >> Occlusion: Unable to assess molar occlusion.   RADIOGRAPHIC EXAM: No radiographs were taken at today's visit.   ASSESSMENT:  1. H/o tonsil cancer with prior radiation therapy 2. Dental consultation  3. Long-term use of anticoagulation (on Eliquis) 4. Missing teeth 5. Caries 6. Periodontal disease 7. Postoperative bleeding risk   PLAN AND RECOMMENDATIONS: >  I discussed the risks, benefits, and complications of various scenarios with the patient in relationship to their medical and dental conditions.  I explained that per my discussion with Dr. Isidore Moos and Dr. Sondra Come, who are the radiation oncologists we work closely with, that based on her diagnosis of tonsil cancer she was likely treated with 63 Gray to both sides of her posterior mandible in 1997.  Although this is only an educated guess, she should be okay to proceed with single extraction of tooth #30 with low risk of postoperative complications (osteoradionecrosis) since it has been several years since she completed radiation.  I explained that there will always be a risk for ORN in her lifetime with her history, but that risk is very low.  The patient verbalized understanding of discussion, and recommendations. >>  We then discussed various treatment options to include no treatment, referral to an oral surgeon for extraction of tooth #30 or return to her periodontist with referral/updated information we discussed today for the extraciton.  The patient verbalized understanding of all options, and currently wishes to proceed  with returning to her periodontist.  I told the patient that I will send their office everything we have discussed today, and if her doctor has any questions or concerns prior to extracting the tooth I am happy to answer them and discuss further with him.  She is agreeable to this plan. >>>  Plan to discuss all findings and recommendations with medical team and coordinate future care as needed.   <> The patient tolerated today's visit well.  All questions and concerns were addressed and answered, and the patient departed in stable condition.  Reasnor Benson [place], D.M.D.

## 2021-03-02 ENCOUNTER — Telehealth: Payer: Self-pay

## 2021-03-02 NOTE — Progress Notes (Addendum)
    Chronic Care Management Pharmacy Assistant   Name: Danielle Pratt   MRN: [id number] DOB: [birth year]   Reason for Encounter: Disease State - General Adherence  Recent Office visits: None noted  Recent consult visits: 02/14/21 [name] ([place]) - Malignant neoplasm of tonsillar fossa  11/23/20 [name]) - Colon Cancer screening. Start Na Sulfate-K 17.5-3.13-1.6 gm. Colonoscopy scheduled.   Medications: Outpatient Encounter Medications as of 03/02/2021  Medication Sig Note   apixaban (ELIQUIS) 5 MG TABS tablet Take 1 tablet (5 mg total) by mouth 2 (two) times daily.    Cholecalciferol (VITAMIN D3) 125 MCG (5000 UT) CAPS Take 1 capsule by mouth daily.    clobetasol cream (TEMOVATE) 0.97 % Apply 1 application topically at bedtime as needed (for skin irritation/finger).     Cyanocobalamin (VITAMIN B-12 PO) Take 2,500 mcg by mouth daily.    levothyroxine (SYNTHROID) 100 MCG tablet Take 1 tablet (100 mcg total) by mouth daily.    pilocarpine (SALAGEN) 5 MG tablet Take 5 mg by mouth See admin instructions. Take 1 tablet (5 mg) by mouth 3 times daily - early morning, midday and bedtime, may take an additional tablet during the day as needed for dry mouth. 06/09/2013: For xerostomia post radiation, Dr Barbaraann Faster, DDS   tretinoin (RETIN-A) 0.05 % cream Apply 1 application topically at bedtime.    venlafaxine XR (EFFEXOR-XR) 75 MG 24 hr capsule TAKE ONE CAPSULE BY MOUTH EVERY NIGHT AT BEDTIME    No facility-administered encounter medications on file as of 03/02/2021.   Attempted to reach patient to review medications and assess health state. Left voicemial for patient to return call. Unable to reach patient.  Star Rating Drugs: None noted  [name], [place] Clinical Pharmacists Assistant [phone]

## 2021-03-16 ENCOUNTER — Ambulatory Visit: Payer: PPO | Admitting: Cardiovascular Disease

## 2021-03-16 ENCOUNTER — Encounter: Payer: Self-pay | Admitting: Cardiovascular Disease

## 2021-03-16 ENCOUNTER — Other Ambulatory Visit: Payer: Self-pay

## 2021-03-16 VITALS — BP 132/82 | HR 83 | Ht 69.0 in | Wt 149.4 lb

## 2021-03-16 DIAGNOSIS — E782 Mixed hyperlipidemia: Secondary | ICD-10-CM | POA: Diagnosis not present

## 2021-03-16 DIAGNOSIS — I447 Left bundle-branch block, unspecified: Secondary | ICD-10-CM

## 2021-03-16 DIAGNOSIS — I10 Essential (primary) hypertension: Secondary | ICD-10-CM | POA: Diagnosis not present

## 2021-03-16 DIAGNOSIS — I2721 Secondary pulmonary arterial hypertension: Secondary | ICD-10-CM | POA: Diagnosis not present

## 2021-03-16 LAB — LIPID PANEL
Chol/HDL Ratio: 3.4 ratio (ref 0.0–4.4)
Cholesterol, Total: 250 mg/dL — ABNORMAL HIGH (ref 100–199)
HDL: 74 mg/dL (ref >39)
LDL Chol Calc (NIH): 162 mg/dL — ABNORMAL HIGH (ref 0–99)
Triglycerides: 81 mg/dL (ref 0–149)
VLDL Cholesterol Cal: 14 mg/dL (ref 5–40)

## 2021-03-16 LAB — HEPATIC FUNCTION PANEL
ALT: 12 IU/L (ref 0–32)
AST: 17 IU/L (ref 0–40)
Albumin: 5.1 g/dL — ABNORMAL HIGH (ref 3.8–4.8)
Alkaline Phosphatase: 110 IU/L (ref 44–121)
Bilirubin Total: 0.3 mg/dL (ref 0.0–1.2)
Bilirubin, Direct: <0.1 mg/dL (ref 0.00–0.40)
Total Protein: 7.3 g/dL (ref 6.0–8.5)

## 2021-03-16 NOTE — Progress Notes (Signed)
03/16/2021 Danielle Pratt   12-04-1950  [id number]  Primary Physician Quay Burow, Claudina Lick, MD Primary Cardiologist: Lorretta Harp MD [name], [place], [place]  HPI:  Danielle Pratt is a 70 y.o. thin and fit-appearing married Caucasian female mother of 2, grandmother and 2 grandchildren who I last saw in the office 02/04/2018.  He was referred by Dr. Lebron Conners , hematology oncology, for evaluation and treatment of chronic pulmonary thromboembolic disease.she has no cardiac risk factors. She had her first episode of pulmonary emboli back in 1994 and her second episode March 2017 that it occurred in association with decreased mobility due to a brace applied for dislocation of an implanted hip. She was on oral anticoagulation for 6 months after that. She did have tibial DVT as well. She had recurrent shortness of breath and recent admission in January demonstrating increased pulmonary embolic load although her RV LV ratio did not warrant local thrombolytic therapy. She is on chronic O2 to keep recess greater than 90%. Should she has mild limitations regarding dyspnea. 2-D echo revealed normal LV systolic function, mildly dilated and hypokinetic right ventricle but her pulmonary pressures were not elevated. She is on Eliquis at this time. Venous Dopplers were negative. She apparently has undergone a thrombophilia workup as well.  I referred her to Dr. Haroldine Laws who did a right left heart cath revealing normal coronary arteries.  When he put a pigtail catheter in her LV she developed complete heart block which ultimately resolved spontaneously.  He did refer her to Dr. Lincoln Brigham at [facility] who did a pulmonary embolectomy on her 6/19 resulting in marked improvement in her clinical status.  She no longer is on oxygen feet.  She denies chest pain or shortness of breath.  Her last 2D echo performed 04/06/2020 revealed normal LV systolic function and normal RV systolic function.   Current Meds  Medication Sig  .  Cholecalciferol (VITAMIN D3) 125 MCG (5000 UT) CAPS Take 1 capsule by mouth daily.  . clobetasol cream (TEMOVATE) 7.16 % Apply 1 application topically at bedtime as needed (for skin irritation/finger).   . Cyanocobalamin (VITAMIN B-12 PO) Take 2,500 mcg by mouth daily.  [name] levothyroxine (SYNTHROID) 100 MCG tablet Take 1 tablet (100 mcg total) by mouth daily.  . pilocarpine (SALAGEN) 5 MG tablet Take 5 mg by mouth See admin instructions. Take 1 tablet (5 mg) by mouth 3 times daily - early morning, midday and bedtime, may take an additional tablet during the day as needed for dry mouth.  . tretinoin (RETIN-A) 0.05 % cream Apply 1 application topically at bedtime.  [name] venlafaxine XR (EFFEXOR-XR) 75 MG 24 hr capsule TAKE ONE CAPSULE BY MOUTH EVERY NIGHT AT BEDTIME  . [DISCONTINUED] apixaban (ELIQUIS) 5 MG TABS tablet Take 1 tablet (5 mg total) by mouth 2 (two) times daily.     Allergies  Allergen Reactions  . Omnicef [Cefdinir]     Facial swelling    Social History   Socioeconomic History  . Marital status: Married    Spouse name: Not on file  . Number of children: Not on file  . Years of education: Not on file  . Highest education level: Not on file  Occupational History  . Not on file  Tobacco Use  . Smoking status: Never Smoker  . Smokeless tobacco: Never Used  Vaping Use  . Vaping Use: Never used  Substance and Sexual Activity  . Alcohol use: Yes    Alcohol/week: 3.0 standard drinks  Types: 3 Cans of beer per week  . Drug use: No  . Sexual activity: Yes  Other Topics Concern  . Not on file  Social History Narrative  . Not on file   Social Determinants of Health   Financial Resource Strain: Low Risk   . Difficulty of Paying Living Expenses: Not hard at all  Food Insecurity: Not on file  Transportation Needs: Not on file  Physical Activity: Not on file  Stress: Not on file  Social Connections: Not on file  Intimate Partner Violence: Not on file     Review of  Systems: General: negative for chills, fever, night sweats or weight changes.  Cardiovascular: negative for chest pain, dyspnea on exertion, edema, orthopnea, palpitations, paroxysmal nocturnal dyspnea or shortness of breath Dermatological: negative for rash Respiratory: negative for cough or wheezing Urologic: negative for hematuria Abdominal: negative for nausea, vomiting, diarrhea, bright red blood per rectum, melena, or hematemesis Neurologic: negative for visual changes, syncope, or dizziness All other systems reviewed and are otherwise negative except as noted above.    Blood pressure 132/82, pulse 83, height 5\' 9"  (1.753 m), weight 149 lb 6.4 oz (67.8 kg), SpO2 97 %.  General appearance: alert and no distress Neck: no adenopathy, no carotid bruit, no JVD, supple, symmetrical, trachea midline and thyroid not enlarged, symmetric, no tenderness/mass/nodules Lungs: clear to auscultation bilaterally Heart: regular rate and rhythm, S1, S2 normal, no murmur, click, rub or gallop Extremities: extremities normal, atraumatic, no cyanosis or edema Pulses: 2+ and symmetric Skin: Skin color, texture, turgor normal. No rashes or lesions Neurologic: Alert and oriented X 3, normal strength and tone. Normal symmetric reflexes. Normal coordination and gait  EKG sinus rhythm at 83 with left bundle branch block.  I personally reviewed this EKG.  ASSESSMENT AND PLAN:   PAH (pulmonary artery hypertension) ([place]) History of chronic pulmonary thromboembolic disease/pulm hypertension status post embolectomy at [facility] by Dr. Lincoln Brigham June 2019.  Since that time she has been off oxygen and feels clinically back to normal.  Her last 2D echocardiogram performed 04/06/2020 revealed normal LV systolic function, normal RV systolic function.  Her TR signal was inadequate versus assessing PA pressures.  She is on chronic Eliquis oral anticoagulation.  Hyperlipidemia, mixed History of hyperlipidemia not on statin therapy  with lipid profile performed 10/12/2020 revealing total cholesterol 245, LDL of 164 and HDL of 67.  She is not interested in starting statin.  She says she will start exercising more but I reinforced the fact that this is unlikely to reduce her lipid profile significantly.  We will recheck a lipid liver profile today  LEFT BUNDLE [place] Chronic      Lorretta Harp MD [facility], [facility] 03/16/2021 9:26 AM

## 2021-03-16 NOTE — Assessment & Plan Note (Signed)
History of hyperlipidemia not on statin therapy with lipid profile performed 10/12/2020 revealing total cholesterol 245, LDL of 164 and HDL of 67.  She is not interested in starting statin.  She says she will start exercising more but I reinforced the fact that this is unlikely to reduce her lipid profile significantly.  We will recheck a lipid liver profile today

## 2021-03-16 NOTE — Patient Instructions (Signed)

## 2021-03-16 NOTE — Assessment & Plan Note (Signed)
History of chronic pulmonary thromboembolic disease/pulm hypertension status post embolectomy at [facility] by Dr. Lincoln Brigham June 2019.  Since that time she has been off oxygen and feels clinically back to normal.  Her last 2D echocardiogram performed 04/06/2020 revealed normal LV systolic function, normal RV systolic function.  Her TR signal was inadequate versus assessing PA pressures.  She is on chronic Eliquis oral anticoagulation.

## 2021-03-16 NOTE — Assessment & Plan Note (Signed)
Chronic. 

## 2021-03-20 ENCOUNTER — Other Ambulatory Visit: Payer: Self-pay

## 2021-03-20 DIAGNOSIS — E782 Mixed hyperlipidemia: Secondary | ICD-10-CM

## 2021-03-22 ENCOUNTER — Ambulatory Visit: Payer: PPO

## 2021-03-22 ENCOUNTER — Other Ambulatory Visit (HOSPITAL_COMMUNITY): Payer: Self-pay | Admitting: Internal Medicine

## 2021-04-16 ENCOUNTER — Telehealth: Payer: Self-pay | Admitting: Pharmacist

## 2021-04-16 NOTE — Progress Notes (Addendum)
    Chronic Care Management Pharmacy Assistant   Name: Danielle Pratt  MRN: [id number] DOB: 1951/10/04   Reason for Encounter: Disease State General Call    Recent office visits:  None ID  Recent consult visits:  03/16/21 Dr. Quay Burow Cardiology  [facility] visits:  None in previous 6 months  Medications: Outpatient Encounter Medications as of 04/16/2021  Medication Sig Note   Cholecalciferol (VITAMIN D3) 125 MCG (5000 UT) CAPS Take 1 capsule by mouth daily.    clobetasol cream (TEMOVATE) 5.62 % Apply 1 application topically at bedtime as needed (for skin irritation/finger).     Cyanocobalamin (VITAMIN B-12 PO) Take 2,500 mcg by mouth daily.    ELIQUIS 5 MG TABS tablet TAKE ONE TABLET BY MOUTH TWICE A DAY    levothyroxine (SYNTHROID) 100 MCG tablet Take 1 tablet (100 mcg total) by mouth daily.    pilocarpine (SALAGEN) 5 MG tablet Take 5 mg by mouth See admin instructions. Take 1 tablet (5 mg) by mouth 3 times daily - early morning, midday and bedtime, may take an additional tablet during the day as needed for dry mouth. 06/09/2013: For xerostomia post radiation, Dr Barbaraann Faster, DDS   tretinoin (RETIN-A) 0.05 % cream Apply 1 application topically at bedtime.    venlafaxine XR (EFFEXOR-XR) 75 MG 24 hr capsule TAKE ONE CAPSULE BY MOUTH EVERY NIGHT AT BEDTIME    No facility-administered encounter medications on file as of 04/16/2021.  Pharmacist Review  Have you had any problems recently with your health? Patient states that she is not having new health issues at this time  Have you had any problems with your pharmacy? Patient states that she does not have any problems with getting medications or the cost of medications from the pharmacy  What issues or side effects are you having with your medications? Patient states that she does not have any side effects from medications  What would you like me to pass along to [facility], CPP for them to help you with?  Patient states  that she is doing well and does not have any concerns about her health or medications at this time  What can we do to take care of you better? Patient states if any changes in health she will contact Dr. Quay Burow office  Star Rating Drugs: None ID  Ethelene Hal Clinical Pharmacist Assistant [phone]  Time spent:20

## 2021-04-24 ENCOUNTER — Ambulatory Visit: Payer: PPO

## 2021-05-17 ENCOUNTER — Other Ambulatory Visit: Payer: Self-pay

## 2021-05-17 ENCOUNTER — Ambulatory Visit
Admission: RE | Admit: 2021-05-17 | Discharge: 2021-05-17 | Disposition: A | Discharge status: Home / Self Care | Payer: PPO | Source: Ambulatory Visit | Attending: Obstetrics & Gynecology | Admitting: Obstetrics & Gynecology

## 2021-05-17 DIAGNOSIS — Z1231 Encounter for screening mammogram for malignant neoplasm of breast: Secondary | ICD-10-CM

## 2021-05-24 DIAGNOSIS — D225 Melanocytic nevi of trunk: Secondary | ICD-10-CM | POA: Diagnosis not present

## 2021-05-24 DIAGNOSIS — L7211 Pilar cyst: Secondary | ICD-10-CM | POA: Diagnosis not present

## 2021-05-24 DIAGNOSIS — D1801 Hemangioma of skin and subcutaneous tissue: Secondary | ICD-10-CM | POA: Diagnosis not present

## 2021-05-24 DIAGNOSIS — L821 Other seborrheic keratosis: Secondary | ICD-10-CM | POA: Diagnosis not present

## 2021-05-31 DIAGNOSIS — Z01419 Encounter for gynecological examination (general) (routine) without abnormal findings: Secondary | ICD-10-CM | POA: Diagnosis not present

## 2021-05-31 DIAGNOSIS — Z6822 Body mass index (BMI) 22.0-22.9, adult: Secondary | ICD-10-CM | POA: Diagnosis not present

## 2021-06-12 ENCOUNTER — Ambulatory Visit: Payer: PPO

## 2021-06-13 ENCOUNTER — Other Ambulatory Visit (HOSPITAL_BASED_OUTPATIENT_CLINIC_OR_DEPARTMENT_OTHER): Payer: Self-pay

## 2021-06-13 ENCOUNTER — Other Ambulatory Visit: Payer: Self-pay

## 2021-06-13 ENCOUNTER — Ambulatory Visit: Payer: PPO | Attending: Internal Medicine

## 2021-06-13 DIAGNOSIS — Z23 Encounter for immunization: Secondary | ICD-10-CM

## 2021-06-13 MED ORDER — COVID-19 MRNA VACC (MODERNA) 100 MCG/0.5ML IM SUSP
INTRAMUSCULAR | 0 refills | Status: DC
  Filled 2021-06-13: qty 0.25, 1d supply, fill #0

## 2021-06-13 NOTE — Progress Notes (Signed)
   Covid-19 Vaccination Clinic  Name:  Charika Schiele    MRN: [id number] DOB: [birth year]  06/13/2021  Ms. Wiegman was observed post Covid-19 immunization for 15 minutes without incident. She was provided with Vaccine Information Sheet and instruction to access the V-Safe system.   Ms. Ferner was instructed to call 911 with any severe reactions post vaccine: Difficulty breathing  Swelling of face and throat  A fast heartbeat  A bad rash all over body  Dizziness and weakness   Immunizations Administered     Name Date Dose VIS Date Route   Moderna Covid-19 Booster Vaccine 06/13/2021 11:10 AM 0.25 mL 08/30/2020 Intramuscular   Manufacturer: Moderna   Lot: [id number]   [id number]

## 2021-09-18 ENCOUNTER — Ambulatory Visit: Payer: PPO

## 2021-10-14 NOTE — Progress Notes (Signed)
Subjective:    Patient ID: Danielle Pratt, female    DOB: [birth year], 70 y.o.   MRN: [id number]   This visit occurred during the SARS-CoV-2 public health emergency.  Safety protocols were in place, including screening questions prior to the visit, additional usage of staff PPE, and extensive cleaning of exam room while observing appropriate contact time as indicated for disinfecting solutions.    HPI She is here for a physical exam.   Overall she feels less stressed because she has a little bit less responsibility help care for her mother-in-law.  Her 59 year old father is living with her, but he is fairly independent and doing well.  Denies any major changes since she was here last and is happy with her current medications.  Medications and allergies reviewed with patient and updated if appropriate.  Patient Active Problem List   Diagnosis Date Noted   Vitamin D deficiency 10/12/2020   Change in stool 01/26/2020   Thrombophlebitis of superficial veins of left lower extremity 10/22/2019   Pain of both elbows 05/15/2018   PAH (pulmonary artery hypertension) ([place]) 03/02/2018   Oropharyngeal dysphagia 01/01/2018   Hyperphosphatemia 11/14/2017   Closed fracture of sesamoid bone of foot 06/18/2017   Postnasal drip 12/02/2016   Hip dislocation, right ([place]) 02/20/2016   DVT, RLE 01/31/16 02/20/2016   Anxiety 02/20/2016   Pulmonary emboli ([place]) 01/31/2016   Unruptured popliteal cyst 09/07/2013   Hyperlipidemia, mixed 01/01/2010   LEFT BUNDLE BRANCH BLOCK 01/01/2010   OSTEOARTHRITIS 01/01/2010   Malignant neoplasm of tonsillar fossa ([place]) 12/27/2008   Hypothyroidism 07/17/2007    Current Outpatient Medications on File Prior to Visit  Medication Sig Dispense Refill   Cholecalciferol (VITAMIN D3) 125 MCG (5000 UT) CAPS Take 1 capsule by mouth daily.     clobetasol cream (TEMOVATE) 2.40 % Apply 1 application topically at bedtime as needed (for skin irritation/finger).       COVID-19 mRNA vaccine, Moderna, 100 MCG/0.5ML injection Inject into the muscle. 0.25 mL 0   Cyanocobalamin (VITAMIN B-12 PO) Take 2,500 mcg by mouth daily.     ELIQUIS 5 MG TABS tablet TAKE ONE TABLET BY MOUTH TWICE A DAY 60 tablet 8   pilocarpine (SALAGEN) 5 MG tablet Take 5 mg by mouth See admin instructions. Take 1 tablet (5 mg) by mouth 3 times daily - early morning, midday and bedtime, may take an additional tablet during the day as needed for dry mouth.     tretinoin (RETIN-A) 0.05 % cream Apply 1 application topically at bedtime.     No current facility-administered medications on file prior to visit.    Past Medical History:  Diagnosis Date   Anxiety    Arthritis    "hands" (11/12/2017)   Cataract    extraction surgery   DVT (deep venous thrombosis) ([place]) 01/2016   LLE   Fasting hyperglycemia    110 in 01/2011;131 in 05/2007   Hypothyroidism    Popliteal cyst    Pulmonary embolism ([place]) 2017; 11/11/2017   "the one in 2017 was due to LLE DVT; don't why I had the one 11/11/2017"   Renal insufficiency    Tonsillar cancer ([place]) 1998    Past Surgical History:  Procedure Laterality Date   CESAREAN SECTION  1984   G 1 P 2   COLONOSCOPY  2003   negative; Dr Henrene Pastor   LYMPHADENECTOMY Right 1998   neck   PULMONARY EMBOLISM SURGERY     RIGHT HEART CATH N/A 10/22/2018  Procedure: RIGHT HEART CATH;  Surgeon: Jolaine Artist, MD;  Location: [facility];  Service: Cardiovascular;  Laterality: N/A;   RIGHT/LEFT HEART CATH AND CORONARY ANGIOGRAPHY N/A 03/02/2018   Procedure: RIGHT/LEFT HEART CATH AND CORONARY ANGIOGRAPHY;  Surgeon: Jolaine Artist, MD;  Location: [facility];  Service: Cardiovascular;  Laterality: N/A;   TEMPORARY PACEMAKER N/A 03/02/2018   Procedure: TEMPORARY PACEMAKER;  Surgeon: Jolaine Artist, MD;  Location: [facility];  Service: Cardiovascular;  Laterality: N/A;   TONSILLECTOMY AND ADENOIDECTOMY Right 1998   R tonsilar malignancy;  subsequent LN dissection negative   TOTAL ABDOMINAL HYSTERECTOMY  1980s   TOTAL ABDOMINAL HYSTERECTOMY W/ BILATERAL SALPINGOOPHORECTOMY [SHX83]; fibroids; Dr. Nori Riis   TOTAL HIP ARTHROPLASTY Bilateral 2007   Dr Maureen Ralphs, both hips 3 months apart    Social History   Socioeconomic History   Marital status: Married    Spouse name: Not on file   Number of children: Not on file   Years of education: Not on file   Highest education level: Not on file  Occupational History   Not on file  Tobacco Use   Smoking status: Never   Smokeless tobacco: Never  Vaping Use   Vaping Use: Never used  Substance and Sexual Activity   Alcohol use: Yes    Alcohol/week: 3.0 standard drinks    Types: 3 Cans of beer per week   Drug use: No   Sexual activity: Yes  Other Topics Concern   Not on file  Social History Narrative   Not on file   Social Determinants of Health   Financial Resource Strain: Low Risk    Difficulty of Paying Living Expenses: Not hard at all  Food Insecurity: Not on file  Transportation Needs: Not on file  Physical Activity: Not on file  Stress: Not on file  Social Connections: Not on file    Family History  Problem Relation Age of Onset   Hyperlipidemia Mother    Hypertension Mother    Ovarian cancer Mother    Aneurysm Mother        ascending & descending aorta   Heart attack Father 36   Leukemia Paternal Grandmother    Lung cancer Paternal Grandfather    Diabetes Neg Hx    Stroke Neg Hx    Colon cancer Neg Hx    Stomach cancer Neg Hx    Rectal cancer Neg Hx    Breast cancer Neg Hx     Review of Systems  Constitutional:  Negative for chills and fever.  HENT:  Positive for postnasal drip and trouble swallowing.   Eyes:  Negative for visual disturbance.  Respiratory:  Positive for cough (dry). Negative for shortness of breath and wheezing.   Cardiovascular:  Negative for chest pain, palpitations and leg swelling.  Gastrointestinal:  Negative for abdominal pain,  blood in stool, constipation, diarrhea and nausea.       No gerd  Genitourinary:  Negative for dysuria.  Musculoskeletal:  Positive for myalgias. Negative for arthralgias and back pain.  Skin:  Negative for color change and rash.  Neurological:  Negative for light-headedness and headaches.  Psychiatric/Behavioral:  Negative for dysphoric mood. The patient is nervous/anxious.       Objective:   Vitals:   10/15/21 0921  BP: 118/76  Pulse: 76  Temp: 98.2 F (36.8 C)  SpO2: 96%   Filed Weights   10/15/21 0921  Weight: 153 lb (69.4 kg)   Body mass  index is 22.59 kg/m.  BP Readings from Last 3 Encounters:  10/15/21 118/76  03/16/21 132/82  02/14/21 134/78    Wt Readings from Last 3 Encounters:  10/15/21 153 lb (69.4 kg)  03/16/21 149 lb 6.4 oz (67.8 kg)  01/10/21 150 lb (68 kg)    Depression screen [facility] 2/9 10/15/2021 01/26/2020 11/16/2018 11/20/2017 06/09/2013  Decreased Interest 1 0 1 0 0  Down, Depressed, Hopeless 0 0 1 0 0  PHQ - 2 Score 1 0 2 0 0  Altered sleeping 0 - 0 - -  Tired, decreased energy 1 - 1 - -  Change in appetite 0 - 0 - -  Feeling bad or failure about yourself  1 - 1 - -  Trouble concentrating 0 - 0 - -  Moving slowly or fidgety/restless 0 - 0 - -  Suicidal thoughts 0 - 0 - -  PHQ-9 Score 3 - 4 - -  Difficult doing work/chores Not difficult at all - Not difficult at all - -  Some recent data might be hidden     GAD 7 : Generalized Anxiety Score 10/15/2021  Nervous, Anxious, on Edge 0  Control/stop worrying 0  Worry too much - different things 0  Trouble relaxing 0  Restless 0  Easily annoyed or irritable 0  Afraid - awful might happen 0  Total GAD 7 Score 0       Physical Exam Constitutional: She appears well-developed and well-nourished. No distress.  HENT:  Head: Normocephalic and atraumatic.  Right Ear: External ear normal. Normal ear canal and TM Left Ear: External ear normal.  Normal ear canal and TM Mouth/Throat: Oropharynx is  clear and moist.  Eyes: Conjunctivae and EOM are normal.  Neck: Neck supple. No tracheal deviation present. No thyromegaly present.  No carotid bruit  Cardiovascular: Normal rate, regular rhythm and normal heart sounds.   No murmur heard.  No edema. Pulmonary/Chest: Effort normal and breath sounds normal. No respiratory distress. She has no wheezes. She has no rales.  Breast: deferred   Abdominal: Soft. She exhibits no distension. There is no tenderness.  Lymphadenopathy: She has no cervical adenopathy.  Skin: Skin is warm and dry. She is not diaphoretic.  Psychiatric: She has a normal mood and affect. Her behavior is normal.     Lab Results  Component Value Date   WBC 5.1 10/12/2020   HGB 13.8 10/12/2020   HCT 41.8 10/12/2020   PLT 281.0 10/12/2020   GLUCOSE 80 10/12/2020   CHOL 250 (H) 03/16/2021   TRIG 81 03/16/2021   HDL 74 03/16/2021   LDLDIRECT 161.1 06/09/2013   LDLCALC 162 (H) 03/16/2021   ALT 12 03/16/2021   AST 17 03/16/2021   NA 136 10/12/2020   K 4.6 10/12/2020   CL 99 10/12/2020   CREATININE 0.98 10/12/2020   BUN 21 10/12/2020   CO2 31 10/12/2020   TSH 1.07 01/26/2021   INR 1.15 02/25/2018   HGBA1C 5.6 02/01/2016         Assessment & Plan:   Physical exam: Screening blood work  ordered Exercise  not regular Weight  normal Substance abuse  none   Screened for depression using the PHQ 9 scale.  No evidence of depression.    Reviewed recommended immunizations.  Pneumo 23 today   Health Maintenance  Topic Date Due   Pneumonia Vaccine 16+ Years old (2 - PPSV23 if available, else PCV20) 01/25/2021   COVID-19 Vaccine (4 - Booster for [employer] series)  08/08/2021   Zoster Vaccines- Shingrix (2 of 2) 08/30/2021   DEXA SCAN  10/15/2022 (Originally 06/18/2017)   MAMMOGRAM  05/18/2023   COLONOSCOPY (Pts 45-31yrs Insurance coverage will need to be confirmed)  01/10/2026   TETANUS/TDAP  11/11/2028   INFLUENZA VACCINE  Completed   Hepatitis C Screening   Completed   HPV VACCINES  Aged Out          See Problem List for Assessment and Plan of chronic medical problems.

## 2021-10-14 NOTE — Patient Instructions (Addendum)
Blood work was ordered.     Medications changes include :   none  Your prescription(s) have been submitted to your pharmacy. Please take as directed and contact our office if you believe you are having problem(s) with the medication(s).   A referral was ordered for [facility].      Someone from their office will call you to schedule an appointment.    Please followup in 1 year   Health Maintenance, Female Adopting a healthy lifestyle and getting preventive care are important in promoting health and wellness. Ask your health care provider about: The right schedule for you to have regular tests and exams. Things you can do on your own to prevent diseases and keep yourself healthy. What should I know about diet, weight, and exercise? Eat a healthy diet  Eat a diet that includes plenty of vegetables, fruits, low-fat dairy products, and lean protein. Do not eat a lot of foods that are high in solid fats, added sugars, or sodium. Maintain a healthy weight Body mass index (BMI) is used to identify weight problems. It estimates body fat based on height and weight. Your health care provider can help determine your BMI and help you achieve or maintain a healthy weight. Get regular exercise Get regular exercise. This is one of the most important things you can do for your health. Most adults should: Exercise for at least 150 minutes each week. The exercise should increase your heart rate and make you sweat (moderate-intensity exercise). Do strengthening exercises at least twice a week. This is in addition to the moderate-intensity exercise. Spend less time sitting. Even light physical activity can be beneficial. Watch cholesterol and blood lipids Have your blood tested for lipids and cholesterol at 69 years of age, then have this test every 5 years. Have your cholesterol levels checked more often if: Your lipid or cholesterol levels are high. You are older than 69 years of age. You are  at high risk for heart disease. What should I know about cancer screening? Depending on your health history and family history, you may need to have cancer screening at various ages. This may include screening for: Breast cancer. Cervical cancer. Colorectal cancer. Skin cancer. Lung cancer. What should I know about heart disease, diabetes, and high blood pressure? Blood pressure and heart disease High blood pressure causes heart disease and increases the risk of stroke. This is more likely to develop in people who have high blood pressure readings or are overweight. Have your blood pressure checked: Every 3-5 years if you are 51-45 years of age. Every year if you are 8 years old or older. Diabetes Have regular diabetes screenings. This checks your fasting blood sugar level. Have the screening done: Once every three years after age 44 if you are at a normal weight and have a low risk for diabetes. More often and at a younger age if you are overweight or have a high risk for diabetes. What should I know about preventing infection? Hepatitis B If you have a higher risk for hepatitis B, you should be screened for this virus. Talk with your health care provider to find out if you are at risk for hepatitis B infection. Hepatitis C Testing is recommended for: Everyone born from [birth year] through 1964. Anyone with known risk factors for hepatitis C. Sexually transmitted infections (STIs) Get screened for STIs, including gonorrhea and chlamydia, if: You are sexually active and are younger than 69 years of age. You are older than  69 years of age and your health care provider tells you that you are at risk for this type of infection. Your sexual activity has changed since you were last screened, and you are at increased risk for chlamydia or gonorrhea. Ask your health care provider if you are at risk. Ask your health care provider about whether you are at high risk for HIV. Your health care provider  may recommend a prescription medicine to help prevent HIV infection. If you choose to take medicine to prevent HIV, you should first get tested for HIV. You should then be tested every 3 months for as long as you are taking the medicine. Pregnancy If you are about to stop having your period (premenopausal) and you may become pregnant, seek counseling before you get pregnant. Take 400 to 800 micrograms (mcg) of folic acid every day if you become pregnant. Ask for birth control (contraception) if you want to prevent pregnancy. Osteoporosis and menopause Osteoporosis is a disease in which the bones lose minerals and strength with aging. This can result in bone fractures. If you are 51 years old or older, or if you are at risk for osteoporosis and fractures, ask your health care provider if you should: Be screened for bone loss. Take a calcium or vitamin D supplement to lower your risk of fractures. Be given hormone replacement therapy (HRT) to treat symptoms of menopause. Follow these instructions at home: Alcohol use Do not drink alcohol if: Your health care provider tells you not to drink. You are pregnant, may be pregnant, or are planning to become pregnant. If you drink alcohol: Limit how much you have to: 0-1 drink a day. Know how much alcohol is in your drink. In the [place] one drink equals one 12 oz bottle of beer (355 mL), one 5 oz glass of wine (148 mL), or one 1 oz glass of hard liquor (44 mL). Lifestyle Do not use any products that contain nicotine or tobacco. These products include cigarettes, chewing tobacco, and vaping devices, such as e-cigarettes. If you need help quitting, ask your health care provider. Do not use street drugs. Do not share needles. Ask your health care provider for help if you need support or information about quitting drugs. General instructions Schedule regular health, dental, and eye exams. Stay current with your vaccines. Tell your health care provider  if: You often feel depressed. You have ever been abused or do not feel safe at home. Summary Adopting a healthy lifestyle and getting preventive care are important in promoting health and wellness. Follow your health care provider's instructions about healthy diet, exercising, and getting tested or screened for diseases. Follow your health care provider's instructions on monitoring your cholesterol and blood pressure. This information is not intended to replace advice given to you by your health care provider. Make sure you discuss any questions you have with your health care provider. Document Revised: 03/19/2021 Document Reviewed: 03/19/2021 Elsevier Patient Education  [place].

## 2021-10-15 ENCOUNTER — Other Ambulatory Visit: Payer: Self-pay

## 2021-10-15 ENCOUNTER — Encounter: Payer: Self-pay | Admitting: Internal Medicine

## 2021-10-15 ENCOUNTER — Ambulatory Visit (INDEPENDENT_AMBULATORY_CARE_PROVIDER_SITE_OTHER): Payer: PPO | Admitting: Internal Medicine

## 2021-10-15 VITALS — BP 118/76 | HR 76 | Temp 98.2°F | Ht 69.0 in | Wt 153.0 lb

## 2021-10-15 DIAGNOSIS — C090 Malignant neoplasm of tonsillar fossa: Secondary | ICD-10-CM

## 2021-10-15 DIAGNOSIS — F419 Anxiety disorder, unspecified: Secondary | ICD-10-CM | POA: Diagnosis not present

## 2021-10-15 DIAGNOSIS — E034 Atrophy of thyroid (acquired): Secondary | ICD-10-CM

## 2021-10-15 DIAGNOSIS — R1312 Dysphagia, oropharyngeal phase: Secondary | ICD-10-CM

## 2021-10-15 DIAGNOSIS — Z1331 Encounter for screening for depression: Secondary | ICD-10-CM

## 2021-10-15 DIAGNOSIS — Z0000 Encounter for general adult medical examination without abnormal findings: Secondary | ICD-10-CM

## 2021-10-15 DIAGNOSIS — Z23 Encounter for immunization: Secondary | ICD-10-CM | POA: Diagnosis not present

## 2021-10-15 LAB — LIPID PANEL
Cholesterol: 219 mg/dL — ABNORMAL HIGH (ref 0–200)
HDL: 64.6 mg/dL (ref >39.00)
LDL Cholesterol: 137 mg/dL — ABNORMAL HIGH (ref 0–99)
NonHDL: 154.47
Total CHOL/HDL Ratio: 3
Triglycerides: 88 mg/dL (ref 0.0–149.0)
VLDL: 17.6 mg/dL (ref 0.0–40.0)

## 2021-10-15 LAB — CBC WITH DIFFERENTIAL/PLATELET
Basophils Absolute: 0 10*3/uL (ref 0.0–0.1)
Basophils Relative: 0.9 % (ref 0.0–3.0)
Eosinophils Absolute: 0.3 10*3/uL (ref 0.0–0.7)
Eosinophils Relative: 6.6 % — ABNORMAL HIGH (ref 0.0–5.0)
HCT: 42.4 % (ref 36.0–46.0)
Hemoglobin: 13.7 g/dL (ref 12.0–15.0)
Lymphocytes Relative: 29.2 % (ref 12.0–46.0)
Lymphs Abs: 1.4 10*3/uL (ref 0.7–4.0)
MCHC: 32.4 g/dL (ref 30.0–36.0)
MCV: 92.6 fl (ref 78.0–100.0)
Monocytes Absolute: 0.5 10*3/uL (ref 0.1–1.0)
Monocytes Relative: 10.2 % (ref 3.0–12.0)
Neutro Abs: 2.5 10*3/uL (ref 1.4–7.7)
Neutrophils Relative %: 53.1 % (ref 43.0–77.0)
Platelets: 253 10*3/uL (ref 150.0–400.0)
RBC: 4.58 Mil/uL (ref 3.87–5.11)
RDW: 14 % (ref 11.5–15.5)
WBC: 4.7 10*3/uL (ref 4.0–10.5)

## 2021-10-15 LAB — COMPREHENSIVE METABOLIC PANEL
ALT: 13 U/L (ref 0–35)
AST: 19 U/L (ref 0–37)
Albumin: 4.4 g/dL (ref 3.5–5.2)
Alkaline Phosphatase: 80 U/L (ref 39–117)
BUN: 17 mg/dL (ref 6–23)
CO2: 31 mEq/L (ref 19–32)
Calcium: 9.7 mg/dL (ref 8.4–10.5)
Chloride: 102 mEq/L (ref 96–112)
Creatinine, Ser: 0.93 mg/dL (ref 0.40–1.20)
GFR: 62.79 mL/min (ref >60.00)
Glucose, Bld: 82 mg/dL (ref 70–99)
Potassium: 4.5 mEq/L (ref 3.5–5.1)
Sodium: 139 mEq/L (ref 135–145)
Total Bilirubin: 0.7 mg/dL (ref 0.2–1.2)
Total Protein: 7.1 g/dL (ref 6.0–8.3)

## 2021-10-15 LAB — TSH: TSH: 0.84 u[IU]/mL (ref 0.35–5.50)

## 2021-10-15 MED ORDER — VENLAFAXINE HCL ER 75 MG PO CP24: ORAL_CAPSULE | ORAL | 3 refills | Status: DC

## 2021-10-15 MED ORDER — LEVOTHYROXINE SODIUM 100 MCG PO TABS: 100.0000 ug | ORAL_TABLET | Freq: Every day | ORAL | 3 refills | Status: DC

## 2021-10-15 NOTE — Assessment & Plan Note (Signed)
H/o tonsillar fossa neoplasm Will refer to ENT for follow up

## 2021-10-15 NOTE — Addendum Note (Signed)
Addended by: Marcina Millard on: 10/15/2021 02:24 PM   Modules accepted: Orders

## 2021-10-15 NOTE — Assessment & Plan Note (Signed)
Chronic  Clinically euthyroid Currently taking levothyroxine 100 mcg daily Check tsh  Titrate med dose if needed  

## 2021-10-15 NOTE — Assessment & Plan Note (Addendum)
Chronic Controlled, stable GAD7 screening shows score of 0 - anxiety well controlled Continue effexor 75 mg daily

## 2021-10-15 NOTE — Assessment & Plan Note (Signed)
Chronic, but worsening H/o tonsillar fossa neoplasm s/p radiation Will refer to ENT for further evaluation

## 2021-11-16 ENCOUNTER — Telehealth: Payer: PPO

## 2021-11-20 ENCOUNTER — Ambulatory Visit: Payer: PPO | Attending: Internal Medicine

## 2021-11-20 ENCOUNTER — Other Ambulatory Visit (HOSPITAL_BASED_OUTPATIENT_CLINIC_OR_DEPARTMENT_OTHER): Payer: Self-pay

## 2021-11-20 DIAGNOSIS — Z23 Encounter for immunization: Secondary | ICD-10-CM

## 2021-11-20 MED ORDER — MODERNA COVID-19 BIVAL BOOSTER 50 MCG/0.5ML IM SUSP
INTRAMUSCULAR | 0 refills | Status: DC
  Filled 2021-11-20: qty 0.3, 1d supply, fill #0

## 2021-11-21 NOTE — Progress Notes (Signed)
° °  Covid-19 Vaccination Clinic  Name:  Danielle Pratt    MRN: [id number] DOB: 1951-05-02  11/21/2021  [name] was observed post Covid-19 immunization for 15 minutes without incident. She was provided with Vaccine Information Sheet and instruction to access the V-Safe system.   [name] was instructed to call 911 with any severe reactions post vaccine: Difficulty breathing  Swelling of face and throat  A fast heartbeat  A bad rash all over body  Dizziness and weakness   Immunizations Administered     Name Date Dose VIS Date Route   Moderna Covid-19 vaccine Bivalent Booster 11/20/2021  4:03 PM 0.5 mL 06/23/2021 Intramuscular   Manufacturer: Moderna   Lot: [id number]   [place]iron: 30051-102-11

## 2021-12-17 ENCOUNTER — Other Ambulatory Visit (HOSPITAL_COMMUNITY): Payer: Self-pay | Admitting: Internal Medicine

## 2022-01-02 ENCOUNTER — Ambulatory Visit (INDEPENDENT_AMBULATORY_CARE_PROVIDER_SITE_OTHER): Payer: PPO

## 2022-01-02 DIAGNOSIS — E034 Atrophy of thyroid (acquired): Secondary | ICD-10-CM

## 2022-01-02 DIAGNOSIS — E782 Mixed hyperlipidemia: Secondary | ICD-10-CM

## 2022-01-02 NOTE — Progress Notes (Signed)
Chronic Care Management Pharmacy Note  01/02/2022 Name:  Danielle Pratt MRN:  [id number] DOB:  09/26/1951  Summary: -Patient reports compliance to current medications, denies any issues or concerns with medications at this time  -Declines to start statin at this time, reviewed with patient risks of elevated LDL and benefits of statin, plans to discuss statin therapy with PCP at next visit, plans to continue dietary and lifestyle changes to better control LDL for now   Recommendations/Changes made from today's visit: -Recommending no changes to medications, patient to reach out with any medication issues or concerns - will reach out should she decide she would like to start statin therapy   Plan: -F/u in 8-10 months   Subjective: Danielle Pratt is an 71 y.o. year old female who is a primary patient of Burns, Claudina Lick, MD.  The CCM team was consulted for assistance with disease management and care coordination needs.    Engaged with patient by telephone for follow up visit in response to provider referral for pharmacy case management and/or care coordination services.   Consent to Services:  The patient was given the following information about Chronic Care Management services today, agreed to services, and gave verbal consent: 1. CCM service includes personalized support from designated clinical staff supervised by the primary care provider, including individualized plan of care and coordination with other care providers 2. 24/7 contact phone numbers for assistance for urgent and routine care needs. 3. Service will only be billed when office clinical staff spend 20 minutes or more in a month to coordinate care. 4. Only one practitioner may furnish and bill the service in a calendar month. 5.The patient may stop CCM services at any time (effective at the end of the month) by phone call to the office staff. 6. The patient will be responsible for cost sharing (co-pay) of up to 20% of the  service fee (after annual deductible is met). Patient agreed to services and consent obtained.  Patient Care Team: Binnie Rail, MD as PCP - General (Internal Medicine) Tomasa Blase, [facility] (Pharmacist)  Recent office visits: 10/15/2021 - Dr. Quay Burow - referred to [facility] - no changes to medications - f/u in 1 year   Recent consult visits: None in previous 6 months   Hospital visits: None in previous 6 months  Objective:  Lab Results  Component Value Date   CREATININE 0.93 10/15/2021   BUN 17 10/15/2021   GFR 62.79 10/15/2021   GFRNONAA >60 10/22/2018   GFRAA >60 10/22/2018   NA 139 10/15/2021   K 4.5 10/15/2021   CALCIUM 9.7 10/15/2021   CO2 31 10/15/2021   GLUCOSE 82 10/15/2021    Lab Results  Component Value Date/Time   HGBA1C 5.6 02/01/2016 03:49 AM   GFR 62.79 10/15/2021 10:27 AM   GFR 59.39 (L) 10/12/2020 10:53 AM    Last diabetic Eye exam:  No results found for: HMDIABEYEEXA  Last diabetic Foot exam:  No results found for: HMDIABFOOTEX   Lab Results  Component Value Date   CHOL 219 (H) 10/15/2021   HDL 64.60 10/15/2021   LDLCALC 137 (H) 10/15/2021   LDLDIRECT 161.1 06/09/2013   TRIG 88.0 10/15/2021   CHOLHDL 3 10/15/2021    Hepatic Function Latest Ref Rng & Units 10/15/2021 03/16/2021 10/12/2020  Total Protein 6.0 - 8.3 g/dL 7.1 7.3 7.2  Albumin 3.5 - 5.2 g/dL 4.4 5.1(H) 4.5  AST 0 - 37 U/L '19 17 19  ' ALT 0 -  35 U/L '13 12 12  ' Alk Phosphatase 39 - 117 U/L 80 110 80  Total Bilirubin 0.2 - 1.2 mg/dL 0.7 0.3 0.5  Bilirubin, Direct 0.00 - 0.40 mg/dL - <0.10 -    Lab Results  Component Value Date/Time   TSH 0.84 10/15/2021 10:27 AM   TSH 1.07 01/26/2021 08:43 AM   FREET4 0.5 (L) 01/21/2007 12:00 AM    CBC Latest Ref Rng & Units 10/15/2021 10/12/2020 10/22/2018  WBC 4.0 - 10.5 K/uL 4.7 5.1 5.0  Hemoglobin 12.0 - 15.0 g/dL 13.7 13.8 12.8  Hematocrit 36.0 - 46.0 % 42.4 41.8 40.6  Platelets 150.0 - 400.0 K/uL 253.0 281.0 238    Lab Results   Component Value Date/Time   VD25OH 75.36 10/12/2020 10:53 AM    Clinical ASCVD: No  The 10-year ASCVD risk score (Arnett DK, et al., 2019) is: 7.3%   Values used to calculate the score:     Age: 89 years     Sex: Female     Is Non-Hispanic African American: No     Diabetic: No     Tobacco smoker: No     Systolic Blood Pressure: 532 mmHg     Is BP treated: No     HDL Cholesterol: 64.6 mg/dL     Total Cholesterol: 219 mg/dL    Depression screen [facility] 2/9 10/15/2021 01/26/2020 11/16/2018  Decreased Interest 1 0 1  Down, Depressed, Hopeless 0 0 1  PHQ - 2 Score 1 0 2  Altered sleeping 0 - 0  Tired, decreased energy 1 - 1  Change in appetite 0 - 0  Feeling bad or failure about yourself  1 - 1  Trouble concentrating 0 - 0  Moving slowly or fidgety/restless 0 - 0  Suicidal thoughts 0 - 0  PHQ-9 Score 3 - 4  Difficult doing work/chores Not difficult at all - Not difficult at all  Some recent data might be hidden     Social History   Tobacco Use  Smoking Status Never  Smokeless Tobacco Never   BP Readings from Last 3 Encounters:  10/15/21 118/76  03/16/21 132/82  02/14/21 134/78   Pulse Readings from Last 3 Encounters:  10/15/21 76  03/16/21 83  02/14/21 87   Wt Readings from Last 3 Encounters:  10/15/21 153 lb (69.4 kg)  03/16/21 149 lb 6.4 oz (67.8 kg)  01/10/21 150 lb (68 kg)   BMI Readings from Last 3 Encounters:  10/15/21 22.59 kg/m  03/16/21 22.06 kg/m  01/10/21 22.15 kg/m    Assessment/Interventions: Review of patient past medical history, allergies, medications, health status, including review of consultants reports, laboratory and other test data, was performed as part of comprehensive evaluation and provision of chronic care management services.   SDOH:  (Social Determinants of Health) assessments and interventions performed: Yes  SDOH Screenings   Alcohol Screen: Not on file  Depression (PHQ2-9): Low Risk    PHQ-2 Score: 3  Financial Resource  Strain: Not on file  Food Insecurity: Not on file  Housing: Not on file  Physical Activity: Not on file  Social Connections: Not on file  Stress: Not on file  Tobacco Use: Low Risk    Smoking Tobacco Use: Never   Smokeless Tobacco Use: Never   Passive Exposure: Not on file  Transportation Needs: Not on file    CCM Care Plan  Allergies  Allergen Reactions   Omnicef [Cefdinir]     Facial swelling    Medications Reviewed Today  Reviewed by Binnie Rail, MD (Physician) on 10/15/21 at 0734  Med List Status: <None>   Medication Order Taking? Sig Documenting Provider Last Dose Status Informant  Cholecalciferol (VITAMIN D3) 125 MCG (5000 UT) CAPS [id number]  Take 1 capsule by mouth daily. [provider]  Active   clobetasol cream (TEMOVATE) 0.05 % [id number]  Apply 1 application topically at bedtime as needed (for skin irritation/finger).  [provider]  Active Self  COVID-19 mRNA vaccine, Moderna, 100 MCG/0.5ML injection [id number]  Inject into the muscle. Carlyle Basques, MD  Active   Cyanocobalamin (VITAMIN B-12 PO) [id number]  Take 2,500 mcg by mouth daily. [provider]  Active   ELIQUIS 5 MG TABS tablet [id number]  TAKE ONE TABLET BY MOUTH TWICE A DAY Bensimhon, Shaune Pascal, MD  Active   levothyroxine (SYNTHROID) 100 MCG tablet [id number]  Take 1 tablet (100 mcg total) by mouth daily. Binnie Rail, MD  Active   pilocarpine (SALAGEN) 5 MG tablet [id number]  Take 5 mg by mouth See admin instructions. Take 1 tablet (5 mg) by mouth 3 times daily - early morning, midday and bedtime, may take an additional tablet during the day as needed for dry mouth. [provider]  Active Self           Med Note (HOPPER, Berna Bue Jun 09, 2013  3:14 PM) For xerostomia post radiation, Dr Barbaraann Faster, DDS  tretinoin (RETIN-A) 0.05 % cream [id number]  Apply 1 application topically at bedtime. [provider]  Active Self  venlafaxine XR (EFFEXOR-XR) 75 MG 24  hr capsule [id number]  TAKE ONE CAPSULE BY MOUTH EVERY NIGHT AT BEDTIME Binnie Rail, MD  Active             Patient Active Problem List   Diagnosis Date Noted   Vitamin D deficiency 10/12/2020   Change in stool 01/26/2020   Thrombophlebitis of superficial veins of left lower extremity 10/22/2019   Pain of both elbows 05/15/2018   PAH (pulmonary artery hypertension) ([place]) 03/02/2018   Oropharyngeal dysphagia 01/01/2018   Closed fracture of sesamoid bone of foot 06/18/2017   Postnasal drip 12/02/2016   Hip dislocation, right ([place]) 02/20/2016   DVT, RLE 01/31/16 02/20/2016   Anxiety 02/20/2016   Pulmonary emboli ([place]) 01/31/2016   Unruptured popliteal cyst 09/07/2013   Hyperlipidemia, mixed 01/01/2010   LEFT BUNDLE BRANCH BLOCK 01/01/2010   OSTEOARTHRITIS 01/01/2010   Malignant neoplasm of tonsillar fossa ([place]) 12/27/2008   Hypothyroidism 07/17/2007    Immunization History  Administered Date(s) Administered   Influenza, High Dose Seasonal PF 09/03/2018, 08/17/2019, 08/25/2020   Influenza, Quadrivalent, Recombinant, Inj, Pf 09/10/2017   Influenza,inj,Quad PF,6+ Mos 09/05/2014, 07/28/2015, 09/05/2016   Influenza-Unspecified 09/05/2014, 07/28/2015, 09/05/2016, 08/11/2017, 09/05/2021   Moderna Covid-19 Vaccine Bivalent Booster 73yr & up 11/20/2021   Moderna SARS-COV2 Booster Vaccination 06/13/2021   PFIZER(Purple Top)SARS-COV-2 Vaccination 12/02/2019, 12/23/2019, 11/16/2020   Pneumococcal Conjugate-13 01/26/2020   Pneumococcal Polysaccharide-23 10/15/2021   Tdap 11/11/2010, 10/12/2017, 11/11/2018   Zoster Recombinat (Shingrix) 07/05/2021, 09/15/2021    Conditions to be addressed/monitored:  Hyperlipidemia, Hypothyroidism, and History of PE   Care Plan : [place] Updates made by STomasa Blase RPH since 01/02/2022 12:00 AM     Problem: HLD, Hypothyroidism, Hx of PE   Priority: High  Onset Date: 01/02/2022     Long-Range Goal: Disease Management    Start Date: 01/02/2022  Expected End Date: 01/02/2023  This Visit's Progress: On track  Priority: High  Note:   Current Barriers:  Chronic Disease Management support, education, and care coordination needs related to Hyperlipidemia, recurrent PE, hypothyroidism   Hyperlipidemia Stable - LDL remains slightly elevated - but latest LDL has improved from previous result  Lab Results  Component Value Date   LDLCALC 137 (H) 10/15/2021  Pharmacist Clinical Goal(s): Patient will work with PharmD and providers to achieve LDL goal 30% reduction Current regimen:  No medication indicated Interventions: Discussed cholesterol goals and benefits of diet/exercise for prevention of heart attack / stroke as well as benefits of statin therapy - patient declined at this time  Patient self care activities - Patient will: Reduce cholesterol in diet and increase exercise - discuss statin therapy with PCP at next visit   Hx Recurrent PE Controlled  Pharmacist Clinical Goal(s) Patient will work with PharmD and providers to optimize therapy Current regimen:  Eliquis 5 mg twice a day Interventions: Discussed indication and benefits of Eliquis Patient self care activities -Patient will: Monitor for signs/symptoms of bleeding  Hypothyroidism Controlled  Lab Results  Component Value Date   TSH 0.84 10/15/2021  Pharmacist Clinical Goal(s) Patient will work with PharmD and providers to optimize therapy Current regimen:  Levothyroxine 100 mcg daily Interventions: Advised to repeat TSH in first week of February Patient self care activities - Patient will: Contact office to schedule repeat TSH (already ordered)  Medication management Pharmacist Clinical Goal(s): Patient will work with PharmD and providers to maintain optimal medication adherence [facility] Interventions Comprehensive medication review performed. Continue current medication management strategy Patient self care  activities - Patient will: Focus on medication adherence by fill date Take medications as prescribed Report any questions or concerns to PharmD and/or provider(s)       Medication Assistance: None required.  Patient affirms current coverage meets needs.  Care Gaps: Shingles Vaccine   Patient's preferred pharmacy is:  [facility] [id number] - [address], [place] [address] Phone: [phone] Fax: [phone]   Uses pill box? No - prefers bottles  Pt endorses 100% compliance  Care Plan and Follow Up Patient Decision:  Patient agrees to Care Plan and Follow-up.  Plan: Telephone follow up appointment with care management team member scheduled for:  8-10 months  The patient has been provided with contact information for the care management team and has been advised to call with any health related questions or concerns.   Tomasa Blase, PharmD Clinical Pharmacist, [place]

## 2022-01-02 NOTE — Patient Instructions (Signed)
Visit Information  Following are the goals we discussed today:   Manage My Medicine   Timeframe:  Long-Range Goal Priority:  Medium Start Date:   01/02/2022                          Expected End Date:  01/02/2023                     Follow Up Date 08/2022   - call for medicine refill 2 or 3 days before it runs out - call if I am sick and can't take my medicine - keep a list of all the medicines I take; vitamins and herbals too - learn to read medicine labels    Why is this important?   These steps will help you keep on track with your medicines.  Plan: Telephone follow up appointment with care management team member scheduled for:  8-10 months  The patient has been provided with contact information for the care management team and has been advised to call with any health related questions or concerns.   Tomasa Blase, PharmD Clinical Pharmacist, [facility]   Please call the care guide team at [phone] if you need to cancel or reschedule your appointment.   Patient verbalizes understanding of instructions and care plan provided today and agrees to view in [facility]. Active MyChart status confirmed with patient.

## 2022-01-08 DIAGNOSIS — E034 Atrophy of thyroid (acquired): Secondary | ICD-10-CM | POA: Diagnosis not present

## 2022-01-08 DIAGNOSIS — E782 Mixed hyperlipidemia: Secondary | ICD-10-CM | POA: Diagnosis not present

## 2022-01-15 ENCOUNTER — Telehealth: Payer: Self-pay | Admitting: Internal Medicine

## 2022-01-15 NOTE — Telephone Encounter (Signed)
LVM for pt to rtn my call to schedule AWV-I with [facility]. Please schedule this appt if pt calls the office.  ?

## 2022-01-16 ENCOUNTER — Other Ambulatory Visit (HOSPITAL_COMMUNITY): Payer: Self-pay | Admitting: Internal Medicine

## 2022-02-12 DIAGNOSIS — Z923 Personal history of irradiation: Secondary | ICD-10-CM | POA: Diagnosis not present

## 2022-02-12 DIAGNOSIS — Z85818 Personal history of malignant neoplasm of other sites of lip, oral cavity, and pharynx: Secondary | ICD-10-CM | POA: Diagnosis not present

## 2022-02-12 DIAGNOSIS — R1314 Dysphagia, pharyngoesophageal phase: Secondary | ICD-10-CM | POA: Diagnosis not present

## 2022-02-13 ENCOUNTER — Other Ambulatory Visit: Payer: Self-pay | Admitting: Otolaryngology

## 2022-02-13 DIAGNOSIS — Z923 Personal history of irradiation: Secondary | ICD-10-CM

## 2022-02-13 DIAGNOSIS — R1314 Dysphagia, pharyngoesophageal phase: Secondary | ICD-10-CM

## 2022-02-13 DIAGNOSIS — Z85818 Personal history of malignant neoplasm of other sites of lip, oral cavity, and pharynx: Secondary | ICD-10-CM

## 2022-02-15 ENCOUNTER — Other Ambulatory Visit (HOSPITAL_COMMUNITY): Payer: Self-pay | Admitting: Internal Medicine

## 2022-02-27 NOTE — Therapy (Signed)
?OUTPATIENT SPEECH LANGUAGE PATHOLOGY SWALLOW EVALUATION ? ? ?Patient Name: Danielle Pratt ?MRN: [id number] ?DOB:[birth year], 71 y.o., female ?Today's Date: 03/01/2022 ? ?PCP: Binnie Rail, MD ?REFERRING PROVIDER: Izora Gala, MD ? ? End of Session - 02/28/22 1522   ? ? Visit Number 1   ? Number of Visits 5   ? Date for SLP Re-Evaluation 04/11/22   ? Authorization Type [employer]   ? SLP Start Time 1445   ? SLP Stop Time  1523   ? SLP Time Calculation (min) 38 min   ? Activity Tolerance Patient tolerated treatment well   ? ?  ?  ? ?  ? ? ?Past Medical History:  ?Diagnosis Date  ? Anxiety   ? Arthritis   ? "hands" (11/12/2017)  ? Cataract   ? extraction surgery  ? DVT (deep venous thrombosis) ([place]) 01/2016  ? LLE  ? Fasting hyperglycemia   ? 110 in 01/2011;131 in 05/2007  ? Hypothyroidism   ? Popliteal cyst   ? Pulmonary embolism ([place]) 2017; 11/11/2017  ? "the one in 2017 was due to LLE DVT; don't why I had the one 11/11/2017"  ? Renal insufficiency   ? Tonsillar cancer ([place]) 1998  ? ?Past Surgical History:  ?Procedure Laterality Date  ? [place]  ? G 1 P 2  ? COLONOSCOPY  2003  ? negative; Dr Henrene Pastor  ? LYMPHADENECTOMY Right 1998  ? neck  ? PULMONARY EMBOLISM SURGERY    ? RIGHT HEART CATH N/A 10/22/2018  ? Procedure: RIGHT HEART CATH;  Surgeon: Jolaine Artist, MD;  Location: [facility];  Service: Cardiovascular;  Laterality: N/A;  ? RIGHT/LEFT HEART CATH AND CORONARY ANGIOGRAPHY N/A 03/02/2018  ? Procedure: RIGHT/LEFT HEART CATH AND CORONARY ANGIOGRAPHY;  Surgeon: Jolaine Artist, MD;  Location: [facility];  Service: Cardiovascular;  Laterality: N/A;  ? TEMPORARY PACEMAKER N/A 03/02/2018  ? Procedure: TEMPORARY PACEMAKER;  Surgeon: Jolaine Artist, MD;  Location: [facility];  Service: Cardiovascular;  Laterality: N/A;  ? TONSILLECTOMY AND ADENOIDECTOMY Right 1998  ? R tonsilar malignancy; subsequent LN dissection negative  ? TOTAL ABDOMINAL HYSTERECTOMY  1980s   ? TOTAL ABDOMINAL HYSTERECTOMY W/ BILATERAL SALPINGOOPHORECTOMY [SHX83]; fibroids; Dr. Nori Riis  ? TOTAL HIP ARTHROPLASTY Bilateral 2007  ? Dr Maureen Ralphs, both hips 3 months apart  ? ?Patient Active Problem List  ? Diagnosis Date Noted  ? Vitamin D deficiency 10/12/2020  ? Change in stool 01/26/2020  ? Thrombophlebitis of superficial veins of left lower extremity 10/22/2019  ? Pain of both elbows 05/15/2018  ? PAH (pulmonary artery hypertension) ([place]) 03/02/2018  ? Oropharyngeal dysphagia 01/01/2018  ? Closed fracture of sesamoid bone of foot 06/18/2017  ? Postnasal drip 12/02/2016  ? Hip dislocation, right ([place]) 02/20/2016  ? DVT, RLE 01/31/16 02/20/2016  ? Anxiety 02/20/2016  ? Pulmonary emboli ([place]) 01/31/2016  ? Unruptured popliteal cyst 09/07/2013  ? Hyperlipidemia, mixed 01/01/2010  ? LEFT BUNDLE BRANCH BLOCK 01/01/2010  ? OSTEOARTHRITIS 01/01/2010  ? Malignant neoplasm of tonsillar fossa ([place]) 12/27/2008  ? Hypothyroidism 07/17/2007  ? ? ?ONSET DATE: 3+ years, referral received on 02/15/2022  ? ?REFERRING DIAG: R13.14 (ICD-10-CM) - Dysphagia, pharyngoesophageal phase  ? ?THERAPY DIAG:  ?Dysphagia, unspecified type ? ?SUBJECTIVE:  ? ?SUBJECTIVE STATEMENT: ?"I think this has something to do with the radiation I got years ago" ?Pt accompanied by: self ? ?PERTINENT HISTORY: History of oropharyngeal cancer 25 or so years ago treated with radiation and  neck dissection. She thinks she may have been having some more difficulty with her swallowing over the past couple of years. Sometimes she gets choked when she is eating or drinking.  ? ?PAIN:  ?Are you having pain? No ? ?FALLS: Has patient fallen in last 6 months?  No ? ?LIVING ENVIRONMENT: ?Lives with: lives with their spouse ?Lives in: House/apartment ? ?PLOF:  ?Level of assistance: Independent with IADLs ?Employment: Retired ? ? ?PATIENT GOALS "see what's wrong with my swallowing" ? ?OBJECTIVE:  ? ?COGNITION: ?Overall cognitive status: Within functional limits for tasks  assessed ? ?CLINICAL SWALLOW ASSESSMENT:   ?Current diet: regular and current diet modifications: avoiding meat, sandwiches, crunchy foods, popcorn ?Dentition: adequate natural dentition ?Patient directly observed with POs: Yes: thin liquids via sequential sips ?Feeding: able to feed self ?Liquids provided by: cup ?Oral phase signs and symptoms:  n/a ?Pharyngeal phase signs and symptoms: delayed throat clear and complaints of residue ? ? ? PATIENT REPORTED OUTCOME MEASURES (PROM): ?EAT-10: to be completed next session ? ? ?TODAY'S TREATMENT:  ?Education provided on evaluation results, ST observations and recommendations, POC. Provide patient with general aspiration precautions and compensations recommended to address pt's current reports of difficulties with swallow. Recommend pt have MBSS to objectively assess the swallow. Explain procedure, answer all pt's questions. Pt in agreement with MBSS at [facility].  ? ? ?PATIENT EDUCATION: ?Education details: see above ?Person educated: Patient ?Education method: Explanation, Demonstration, and Handouts ?Education comprehension: verbalized understanding and returned demonstration ? ? ?ASSESSMENT: ? ?CLINICAL IMPRESSION: ?Patient is a 71 y.o. F who was seen today for dysphagia evaluation. Pt reports coughing with liquids, having difficulty swallowing dry foods, and frequent globus sensation. Has altered diet to avoid foods which are problematic or altering presentation (e.g. adding gravy) to enable success. Reports x2 occurences in last month of coffee coming through nose. Adminstered thin liquids via sequential sips, pt without overt s/sx of aspiration. Frequent throat clears noted throughout session, pt reports d/t feeling "phlegmy." X17 observed over 20 minute period. Pt endorses this occurring throughout the day. Could be 2/2 residue if pt has pharyngeal weakness and is not completely clearing boluses during initial or subsequent swallows. Pt also with dry mouth, is on  medications for such w/o much subjective improvement. I recommend objective swallow study and initiation of dysphagia therapy to strengthen swallowing musculature to improve pt's swallow function.  ? ?OBJECTIVE IMPAIRMENTS include dysphagia. These impairments are limiting patient from safety when swallowing. ?No overt factors affecting potential to achieve goals and functional outcomes. Patient will benefit from skilled SLP services to address above impairments and improve overall function. ? ?REHAB POTENTIAL: Good ? ? ?GOALS: ?Goals reviewed with patient? Yes ? ?SHORT TERM GOALS = LONG TERM GOALS D/T LENGTH POC ? ?LONG TERM GOALS: Target date: 04/12/2022 ? ?Pt will report compliance with swallowing HEP exercises and repetitions over 1 week period with mod-I ?Baseline:  ?Goal status: INITIAL ? ?2.  Pt will teach back diet modifications and swallow compensations with rare min-A over 3 sessions.  ?Baseline:  ?Goal status: INITIAL ? ?3.  Pt will report subjective improvement in swallow function by d/c.  ?Baseline:  ?Goal status: INITIAL ? ?4.  Pt will complete MBSS to objectively assess swallow function and safety  ?Baseline:  ?Goal status: INITIAL ?PLAN: ?SLP FREQUENCY: 1x/week ? ?SLP DURATION: 6 weeks ? ?PLANNED INTERVENTIONS: Aspiration precaution training, Pharyngeal strengthening exercises, SLP instruction and feedback, Compensatory strategies, and Patient/family education ? ? ? ?Su Monks, CCC-SLP ?03/01/2022, 8:13  AM ? ? ? ?  ?

## 2022-02-28 ENCOUNTER — Ambulatory Visit: Payer: PPO | Attending: Otolaryngology | Admitting: Speech Pathology

## 2022-02-28 DIAGNOSIS — R1310 Dysphagia, unspecified: Secondary | ICD-10-CM | POA: Insufficient documentation

## 2022-02-28 NOTE — Patient Instructions (Signed)
I am referring you for MBSS at [facility] ? ?They will call you to schedule after Dr. Constance Holster signs the order. ? ?After your study, please call [phone] to schedule f/u appointment with Janett Billow or Wendelyn Breslow  ?

## 2022-03-04 ENCOUNTER — Telehealth (HOSPITAL_COMMUNITY): Payer: Self-pay

## 2022-03-04 NOTE — Telephone Encounter (Signed)
Attempted to contact patient to schedule OP MBS - left voicemail. ?

## 2022-03-07 ENCOUNTER — Other Ambulatory Visit (HOSPITAL_COMMUNITY): Payer: Self-pay

## 2022-03-07 DIAGNOSIS — R1310 Dysphagia, unspecified: Secondary | ICD-10-CM

## 2022-03-15 ENCOUNTER — Ambulatory Visit (HOSPITAL_COMMUNITY)
Admission: RE | Admit: 2022-03-15 | Discharge: 2022-03-15 | Disposition: A | Discharge status: Home / Self Care | Payer: PPO | Source: Ambulatory Visit | Attending: Otolaryngology | Admitting: Otolaryngology

## 2022-03-15 DIAGNOSIS — C090 Malignant neoplasm of tonsillar fossa: Secondary | ICD-10-CM | POA: Insufficient documentation

## 2022-03-15 DIAGNOSIS — R1310 Dysphagia, unspecified: Secondary | ICD-10-CM

## 2022-03-15 DIAGNOSIS — R1312 Dysphagia, oropharyngeal phase: Secondary | ICD-10-CM | POA: Diagnosis not present

## 2022-03-15 NOTE — Progress Notes (Signed)
Objective Swallowing Evaluation: Type of Study: MBS-Modified Barium Swallow Study ?  ?Patient Details  ?Name: Danielle Pratt ?MRN: [id number] ?Date of Birth: July 31, 1951 ? ?Today's Date: 03/15/2022 ?Time: SLP Start Time (ACUTE ONLY): 7893 ?-SLP Stop Time (ACUTE ONLY): 8101 ? ?SLP Time Calculation (min) (ACUTE ONLY): 27 min ? ? ?Past Medical History:  ?Past Medical History:  ?Diagnosis Date  ? Anxiety   ? Arthritis   ? "hands" (11/12/2017)  ? Cataract   ? extraction surgery  ? DVT (deep venous thrombosis) ([place]) 01/2016  ? LLE  ? Fasting hyperglycemia   ? 110 in 01/2011;131 in 05/2007  ? Hypothyroidism   ? Popliteal cyst   ? Pulmonary embolism ([place]) 2017; 11/11/2017  ? "the one in 2017 was due to LLE DVT; don't why I had the one 11/11/2017"  ? Renal insufficiency   ? Tonsillar cancer ([place]) 1998  ? ?Past Surgical History:  ?Past Surgical History:  ?Procedure Laterality Date  ? [place]  ? G 1 P 2  ? COLONOSCOPY  2003  ? negative; Dr Henrene Pastor  ? LYMPHADENECTOMY Right 1998  ? neck  ? PULMONARY EMBOLISM SURGERY    ? RIGHT HEART CATH N/A 10/22/2018  ? Procedure: RIGHT HEART CATH;  Surgeon: Jolaine Artist, MD;  Location: [facility];  Service: Cardiovascular;  Laterality: N/A;  ? RIGHT/LEFT HEART CATH AND CORONARY ANGIOGRAPHY N/A 03/02/2018  ? Procedure: RIGHT/LEFT HEART CATH AND CORONARY ANGIOGRAPHY;  Surgeon: Jolaine Artist, MD;  Location: [facility];  Service: Cardiovascular;  Laterality: N/A;  ? TEMPORARY PACEMAKER N/A 03/02/2018  ? Procedure: TEMPORARY PACEMAKER;  Surgeon: Jolaine Artist, MD;  Location: [place];  Service: Cardiovascular;  Laterality: N/A;  ? TONSILLECTOMY AND ADENOIDECTOMY Right 1998  ? R tonsilar malignancy; subsequent LN dissection negative  ? TOTAL ABDOMINAL HYSTERECTOMY  1980s  ? TOTAL ABDOMINAL HYSTERECTOMY W/ BILATERAL SALPINGOOPHORECTOMY [SHX83]; fibroids; Dr. Nori Riis  ? TOTAL HIP ARTHROPLASTY Bilateral 2007  ? Dr Maureen Ralphs, both hips 3 months apart  ? ?HPI:  Pt is a 71 y.o. female who presents for OP MBS per OP SLP recommendation. Pt reports more difficulty swallowing over the past couple of years. Sometimes she gets choked when eating or drinking. She also reports whole pills sticking in throat. PMH:  tonsillar cancer 25 or so years ago s/p radiation and neck dissection, anxiety, DVT, hypothyroidism. ?  ?No data recorded ? ? Recommendations for follow up therapy are one component of a multi-disciplinary discharge planning process, led by the attending physician.  Recommendations may be updated based on patient status, additional functional criteria and insurance authorization. ? ?Assessment / Plan / Recommendation ? ? ?  03/15/2022  ?  1:23 PM  ?Clinical Impressions  ?Clinical Impression Pt presents with mild pharyngeal dysphagia marked by reduced hyolaryngeal excursion, base of tongue retraction, pharyngeal peristalsis and shortened opening of the UES resulting in mild pharyngeal residuals across consistencies. Flash penetration (PAS 2) of thin liquids noted in majority of trials and material was cleared from airway with strength of laryngeal vestibule closure. No aspiration observed. Residuals on posterior pharyngeal wall, vallecular space and pyriforms (less so) increased with solid POs, but were able to be cleared with cued effortful swallows and liquid wash. Pill with thin barium was hung in vallecular space despite repeated washes, ultimately cleared with bite of puree. Note that pt presented with frequent throat clearing prior to testing which persisted throughout and post testing. While no aspiration was noted  via fluoro and residue was minimal-none post testing, pt complained of something sticking in her throat. Question if ENT consult needs to be considered. At this time, recommend continue regular, thin liquid diet with use of repeat effortful swallow and liquid wash to increase clearance of pharyngeal residuals. All other universal swallow precautions also  recommended. Will defer further treatment to OP SLP. ?  ?SLP Visit Diagnosis Dysphagia, pharyngeal phase (R13.13)  ?Impact on safety and function Mild aspiration risk  ? ?   ? ?  03/15/2022  ?  1:23 PM  ?Treatment Recommendations  ?Treatment Recommendations Defer treatment plan to f/u with SLP  ?   ? ?  03/15/2022  ?  1:23 PM  ?Prognosis  ?Prognosis for Safe Diet Advancement Good  ?Barriers to Reach Goals Time post onset  ? ? ? ?  03/15/2022  ?  1:23 PM  ?Diet Recommendations  ?SLP Diet Recommendations Regular solids;Thin liquid  ?Liquid Administration via Cup;Straw  ?Medication Administration Whole meds with liquid  ?Compensations Small sips/bites;Slow rate;Follow solids with liquid;Multiple dry swallows after each bite/sip  ?Postural Changes Seated upright at 90 degrees  ?   ? ? ?  03/15/2022  ?  1:23 PM  ?Other Recommendations  ?Oral Care Recommendations Oral care BID  ?Follow Up Recommendations Outpatient SLP  ?Assistance recommended at discharge PRN  ?Functional Status Assessment Patient has had a recent decline in their functional status and demonstrates the ability to make significant improvements in function in a reasonable and predictable amount of time.  ? ? ?   ? View : No data to display.  ?  ?  ?  ?   ? ? ?  03/15/2022  ?  1:23 PM  ?Oral Phase  ?Oral Phase WFL  ?  ? ?  03/15/2022  ?  1:23 PM  ?Pharyngeal Phase  ?Pharyngeal Phase Impaired  ?Pharyngeal- Thin Cup Reduced pharyngeal peristalsis;Reduced anterior laryngeal mobility;Reduced tongue base retraction;Penetration/Aspiration during swallow;Pharyngeal residue - valleculae;Pharyngeal residue - posterior pharnyx;Pharyngeal residue - pyriform  ?Pharyngeal- Thin Straw Reduced pharyngeal peristalsis;Reduced anterior laryngeal mobility;Reduced tongue base retraction;Penetration/Aspiration during swallow;Pharyngeal residue - valleculae;Pharyngeal residue - posterior pharnyx;Pharyngeal residue - pyriform  ?Pharyngeal- Puree Reduced anterior laryngeal mobility;Reduced  pharyngeal peristalsis;Reduced tongue base retraction;Pharyngeal residue - valleculae  ?Pharyngeal- Regular Reduced anterior laryngeal mobility;Reduced pharyngeal peristalsis;Reduced tongue base retraction;Pharyngeal residue - valleculae  ?Pharyngeal- Pill Pharyngeal residue - valleculae;Reduced pharyngeal peristalsis;Reduced anterior laryngeal mobility;Reduced tongue base retraction  ?  ? ? ?  03/15/2022  ?  1:23 PM  ?Cervical Esophageal Phase   ?Cervical Esophageal Phase WFL  ? ? ? ? ?Ellwood Dense, MA, CCC-SLP ?Acute Rehabilitation Services ?Office Number: [phone] ? ?Acie Fredrickson ?03/15/2022, 1:56 PM ?  ? ?   ?   ?   ?   ?   ? ?  ? ?  ?  ?   ?  ? ? ?  ? ? ?

## 2022-03-20 ENCOUNTER — Other Ambulatory Visit (HOSPITAL_COMMUNITY): Payer: Self-pay | Admitting: Internal Medicine

## 2022-03-25 ENCOUNTER — Other Ambulatory Visit: Payer: Self-pay

## 2022-03-25 MED ORDER — APIXABAN 5 MG PO TABS: 5.0000 mg | ORAL_TABLET | Freq: Two times a day (BID) | ORAL | 0 refills | Status: DC

## 2022-04-22 ENCOUNTER — Ambulatory Visit: Payer: PPO

## 2022-04-26 ENCOUNTER — Ambulatory Visit (INDEPENDENT_AMBULATORY_CARE_PROVIDER_SITE_OTHER): Payer: PPO

## 2022-04-26 DIAGNOSIS — Z780 Asymptomatic menopausal state: Secondary | ICD-10-CM | POA: Diagnosis not present

## 2022-04-26 DIAGNOSIS — Z0000 Encounter for general adult medical examination without abnormal findings: Secondary | ICD-10-CM | POA: Diagnosis not present

## 2022-04-26 NOTE — Patient Instructions (Signed)
Danielle Pratt , Thank you for taking time to come for your Medicare Wellness Visit. I appreciate your ongoing commitment to your health goals. Please review the following plan we discussed and let me know if I can assist you in the future.   Screening recommendations/referrals: Colonoscopy: 01/10/2021 Mammogram: 05/17/2021 Bone Density: referral 04/26/2022 Recommended yearly ophthalmology/optometry visit for glaucoma screening and checkup Recommended yearly dental visit for hygiene and checkup  Vaccinations: Influenza vaccine: completed  Pneumococcal vaccine: completed  Tdap vaccine: 11/11/2018 Shingles vaccine: completed     Advanced directives: yes  Conditions/risks identified: none   Next appointment: none    Preventive Care 20 Years and Older, Female Preventive care refers to lifestyle choices and visits with your health care provider that can promote health and wellness. What does preventive care include? A yearly physical exam. This is also called an annual well check. Dental exams once or twice a year. Routine eye exams. Ask your health care provider how often you should have your eyes checked. Personal lifestyle choices, including: Daily care of your teeth and gums. Regular physical activity. Eating a healthy diet. Avoiding tobacco and drug use. Limiting alcohol use. Practicing safe sex. Taking low-dose aspirin every day. Taking vitamin and mineral supplements as recommended by your health care provider. What happens during an annual well check? The services and screenings done by your health care provider during your annual well check will depend on your age, overall health, lifestyle risk factors, and family history of disease. Counseling  Your health care provider may ask you questions about your: Alcohol use. Tobacco use. Drug use. Emotional well-being. Home and relationship well-being. Sexual activity. Eating habits. History of falls. Memory and ability to  understand (cognition). Work and work Statistician. Reproductive health. Screening  You may have the following tests or measurements: Height, weight, and BMI. Blood pressure. Lipid and cholesterol levels. These may be checked every 5 years, or more frequently if you are over 24 years old. Skin check. Lung cancer screening. You may have this screening every year starting at age 62 if you have a 30-pack-year history of smoking and currently smoke or have quit within the past 15 years. Fecal occult blood test (FOBT) of the stool. You may have this test every year starting at age 60. Flexible sigmoidoscopy or colonoscopy. You may have a sigmoidoscopy every 5 years or a colonoscopy every 10 years starting at age 62. Hepatitis C blood test. Hepatitis B blood test. Sexually transmitted disease (STD) testing. Diabetes screening. This is done by checking your blood sugar (glucose) after you have not eaten for a while (fasting). You may have this done every 1-3 years. Bone density scan. This is done to screen for osteoporosis. You may have this done starting at age 63. Mammogram. This may be done every 1-2 years. Talk to your health care provider about how often you should have regular mammograms. Talk with your health care provider about your test results, treatment options, and if necessary, the need for more tests. Vaccines  Your health care provider may recommend certain vaccines, such as: Influenza vaccine. This is recommended every year. Tetanus, diphtheria, and acellular pertussis (Tdap, Td) vaccine. You may need a Td booster every 10 years. Zoster vaccine. You may need this after age 54. Pneumococcal 13-valent conjugate (PCV13) vaccine. One dose is recommended after age 92. Pneumococcal polysaccharide (PPSV23) vaccine. One dose is recommended after age 68. Talk to your health care provider about which screenings and vaccines you need and how often you  need them. This information is not  intended to replace advice given to you by your health care provider. Make sure you discuss any questions you have with your health care provider. Document Released: 11/24/2015 Document Revised: 07/17/2016 Document Reviewed: 08/29/2015 Elsevier Interactive Patient Education  2017 [place] Prevention in the Home Falls can cause injuries. They can happen to people of all ages. There are many things you can do to make your home safe and to help prevent falls. What can I do on the outside of my home? Regularly fix the edges of walkways and driveways and fix any cracks. Remove anything that might make you trip as you walk through a door, such as a raised step or threshold. Trim any bushes or trees on the path to your home. Use bright outdoor lighting. Clear any walking paths of anything that might make someone trip, such as rocks or tools. Regularly check to see if handrails are loose or broken. Make sure that both sides of any steps have handrails. Any raised decks and porches should have guardrails on the edges. Have any leaves, snow, or ice cleared regularly. Use sand or salt on walking paths during winter. Clean up any spills in your garage right away. This includes oil or grease spills. What can I do in the bathroom? Use night lights. Install grab bars by the toilet and in the tub and shower. Do not use towel bars as grab bars. Use non-skid mats or decals in the tub or shower. If you need to sit down in the shower, use a plastic, non-slip stool. Keep the floor dry. Clean up any water that spills on the floor as soon as it happens. Remove soap buildup in the tub or shower regularly. Attach bath mats securely with double-sided non-slip rug tape. Do not have throw rugs and other things on the floor that can make you trip. What can I do in the bedroom? Use night lights. Make sure that you have a light by your bed that is easy to reach. Do not use any sheets or blankets that are  too big for your bed. They should not hang down onto the floor. Have a firm chair that has side arms. You can use this for support while you get dressed. Do not have throw rugs and other things on the floor that can make you trip. What can I do in the kitchen? Clean up any spills right away. Avoid walking on wet floors. Keep items that you use a lot in easy-to-reach places. If you need to reach something above you, use a strong step stool that has a grab bar. Keep electrical cords out of the way. Do not use floor polish or wax that makes floors slippery. If you must use wax, use non-skid floor wax. Do not have throw rugs and other things on the floor that can make you trip. What can I do with my stairs? Do not leave any items on the stairs. Make sure that there are handrails on both sides of the stairs and use them. Fix handrails that are broken or loose. Make sure that handrails are as long as the stairways. Check any carpeting to make sure that it is firmly attached to the stairs. Fix any carpet that is loose or worn. Avoid having throw rugs at the top or bottom of the stairs. If you do have throw rugs, attach them to the floor with carpet tape. Make sure that you have a light switch  at the top of the stairs and the bottom of the stairs. If you do not have them, ask someone to add them for you. What else can I do to help prevent falls? Wear shoes that: Do not have high heels. Have rubber bottoms. Are comfortable and fit you well. Are closed at the toe. Do not wear sandals. If you use a stepladder: Make sure that it is fully opened. Do not climb a closed stepladder. Make sure that both sides of the stepladder are locked into place. Ask someone to hold it for you, if possible. Clearly mark and make sure that you can see: Any grab bars or handrails. First and last steps. Where the edge of each step is. Use tools that help you move around (mobility aids) if they are needed. These  include: Canes. Walkers. Scooters. Crutches. Turn on the lights when you go into a dark area. Replace any light bulbs as soon as they burn out. Set up your furniture so you have a clear path. Avoid moving your furniture around. If any of your floors are uneven, fix them. If there are any pets around you, be aware of where they are. Review your medicines with your doctor. Some medicines can make you feel dizzy. This can increase your chance of falling. Ask your doctor what other things that you can do to help prevent falls. This information is not intended to replace advice given to you by your health care provider. Make sure you discuss any questions you have with your health care provider. Document Released: 08/24/2009 Document Revised: 04/04/2016 Document Reviewed: 12/02/2014 Elsevier Interactive Patient Education  2017 [employer].

## 2022-04-26 NOTE — Progress Notes (Signed)
Subjective:   Danielle Pratt is a 71 y.o. female who presents for Medicare Annual (Subsequent) preventive examination.   I connected with [name] today by telephone and verified that I am speaking with the correct person using two identifiers. Location patient: home Location provider: work Persons participating in the virtual visit: patient, provider.   I discussed the limitations, risks, security and privacy concerns of performing an evaluation and management service by telephone and the availability of in person appointments. I also discussed with the patient that there may be a patient responsible charge related to this service. The patient expressed understanding and verbally consented to this telephonic visit.    Interactive audio and video telecommunications were attempted between this provider and patient, however failed, due to patient having technical difficulties OR patient did not have access to video capability.  We continued and completed visit with audio only.    Review of Systems     Cardiac Risk Factors include: advanced age (>40mn, >>5women)     Objective:    Today's Vitals   There is no height or weight on file to calculate BMI.     04/26/2022    8:59 AM 02/28/2022    3:22 PM 11/16/2018    2:42 PM 10/22/2018    9:08 AM 03/03/2018    1:00 AM 03/02/2018    6:05 AM 01/12/2018    3:56 PM  Advanced Directives  Does Patient Have a Medical Advance Directive? Yes No Yes Yes Yes Yes Yes  Type of AParamedicof [place]ivers[place] will  Healthcare Power of [place] will Healthcare Power of AMercedLiving will [place]Living will  Does patient want to make changes to medical advance directive?   No - Patient declined  No - Patient declined    Copy of H[place]in Chart? No - copy requested  No - copy requested  No - copy requested No -  copy requested   Would patient like information on creating a medical advance directive?  No - Patient declined         Current Medications (verified) Outpatient Encounter Medications as of 04/26/2022  Medication Sig   apixaban (ELIQUIS) 5 MG TABS tablet Take 1 tablet (5 mg total) by mouth 2 (two) times daily. NEEDS FOLLOW UP APPOINTMENT FOR MORE REFILLS   Cholecalciferol (VITAMIN D3) 125 MCG (5000 UT) CAPS Take 1 capsule by mouth daily.   clobetasol cream (TEMOVATE) 02.83% Apply 1 application topically at bedtime as needed (for skin irritation/finger).    Cyanocobalamin (VITAMIN B-12 PO) Take 2,500 mcg by mouth daily.   levothyroxine (SYNTHROID) 100 MCG tablet Take 1 tablet (100 mcg total) by mouth daily.   METAMUCIL FIBER PO Take 3-4 capsules by mouth daily as needed.   pilocarpine (SALAGEN) 5 MG tablet Take 5 mg by mouth See admin instructions. Take 1 tablet (5 mg) by mouth 3 times daily - early morning, midday and bedtime, may take an additional tablet during the day as needed for dry mouth.   tretinoin (RETIN-A) 0.05 % cream Apply 1 application topically at bedtime.   venlafaxine XR (EFFEXOR-XR) 75 MG 24 hr capsule TAKE ONE CAPSULE BY MOUTH EVERY NIGHT AT BEDTIME   No facility-administered encounter medications on file as of 04/26/2022.    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   Past Surgical History:  Procedure Laterality Date   CESAREAN SECTION  1984   G 1 P 2   COLONOSCOPY  2003   negative; Dr Henrene Pastor   LYMPHADENECTOMY Right 1998    neck   PULMONARY EMBOLISM SURGERY     RIGHT HEART CATH N/A 10/22/2018   Procedure: RIGHT HEART CATH;  Surgeon: Jolaine Artist, MD;  Location: [facility];  Service: Cardiovascular;  Laterality: N/A;   RIGHT/LEFT HEART CATH AND CORONARY ANGIOGRAPHY N/A 03/02/2018   Procedure: RIGHT/LEFT HEART CATH AND CORONARY ANGIOGRAPHY;  Surgeon: Jolaine Artist, MD;  Location: [facility];  Service: Cardiovascular;  Laterality: N/A;   TEMPORARY PACEMAKER N/A 03/02/2018   Procedure: TEMPORARY PACEMAKER;  Surgeon: Jolaine Artist, MD;  Location: [place] [facility];  Service: Cardiovascular;  Laterality: N/A;   TONSILLECTOMY AND ADENOIDECTOMY Right 1998   R tonsilar malignancy; subsequent LN dissection negative   TOTAL ABDOMINAL HYSTERECTOMY  1980s   TOTAL ABDOMINAL HYSTERECTOMY W/ BILATERAL SALPINGOOPHORECTOMY [SHX83]; fibroids; Dr. Nori Riis   TOTAL HIP ARTHROPLASTY Bilateral 2007   Dr Maureen Ralphs, both hips 3 months apart   Family History  Problem Relation Age of Onset   Hyperlipidemia Mother    Hypertension Mother    Ovarian cancer Mother    Aneurysm Mother        ascending & descending aorta   Heart attack Father 97   Leukemia Paternal Grandmother    Lung cancer Paternal Grandfather    Diabetes Neg Hx    Stroke Neg Hx    Colon cancer Neg Hx    Stomach cancer Neg Hx    Rectal cancer Neg Hx    Breast cancer Neg Hx    Social History   Socioeconomic History   Marital status: Married    Spouse name: Not on file   Number of children: Not on file   Years of education: Not on file   Highest education level: Not on file  Occupational History   Not on file  Tobacco Use   Smoking status: Never   Smokeless tobacco: Never  Vaping Use   Vaping Use: Never used  Substance and Sexual Activity   Alcohol use: Yes    Alcohol/week: 3.0 standard drinks of alcohol    Types: 3 Cans of beer per week   Drug use: No   Sexual activity: Yes  Other Topics Concern   Not on file  Social  History Narrative   Not on file   Social Determinants of Health   Financial Resource Strain: Low Risk  (04/26/2022)   Overall Financial Resource Strain (CARDIA)    Difficulty of Paying Living Expenses: Not hard at all  Food Insecurity: No Food Insecurity (04/26/2022)   Hunger Vital Sign    Worried About Running Out of Food in the Last Year: Never true    Ran Out of Food in the Last Year: Never true  Transportation Needs: No Transportation Needs (04/26/2022)   PRAPARE - Hydrologist (Medical): No  Lack of Transportation (Non-Medical): No  Physical Activity: Insufficiently Active (04/26/2022)   Exercise Vital Sign    Days of Exercise per Week: 3 days    Minutes of Exercise per Session: 30 min  Stress: No Stress Concern Present (04/26/2022)   [place]    Feeling of Stress : Only a little  Social Connections: Moderately Integrated (04/26/2022)   Social Connection and Isolation Panel [NHANES]    Frequency of Communication with Friends and Family: Three times a week    Frequency of Social Gatherings with Friends and Family: Three times a week    Attends Religious Services: 1 to 4 times per year    Active Member of Clubs or Organizations: No    Attends Archivist Meetings: Never    Marital Status: Married    Tobacco Counseling Counseling given: Not Answered   Clinical Intake:  Pre-visit preparation completed: Yes  Pain : No/denies pain     Nutritional Risks: None Diabetes: No  How often do you need to have someone help you when you read instructions, pamphlets, or other written materials from your doctor or pharmacy?: 1 - Never What is the last grade level you completed in school?: college  Diabetic?no   Interpreter Needed?: No  Information entered by :: [employer]    04/26/2022    9:03 AM 10/15/2021    9:24 AM  In your present state  of health, do you have any difficulty performing the following activities:  Hearing? 0 0  Vision? 0 0  Difficulty concentrating or making decisions? 0 0  Walking or climbing stairs? 0 0  Dressing or bathing? 0 0  Doing errands, shopping? 0 0  Preparing Food and eating ? N   Using the Toilet? N   In the past six months, have you accidently leaked urine? N   Do you have problems with loss of bowel control? N   Managing your Medications? N   Managing your Finances? N   Housekeeping or managing your Housekeeping? N     Patient Care Team: Binnie Rail, MD as PCP - General (Internal Medicine) Delice Bison, Darnelle Maffucci, [facility] (Inactive) (Pharmacist)  Indicate any recent Medical Services you may have received from other than [facility] providers in the past year (date may be approximate).     Assessment:   This is a routine wellness examination for [facility].  Hearing/Vision screen Vision Screening - Comments:: Annual eye exams wears glasses   Dietary issues and exercise activities discussed: Current Exercise Habits: Home exercise routine, Type of exercise: walking, Time (Minutes): 30, Frequency (Times/Week): 3, Weekly Exercise (Minutes/Week): 90, Intensity: Mild, Exercise limited by: None identified   Goals Addressed   None    Depression Screen    04/26/2022    8:59 AM 04/26/2022    8:57 AM 10/15/2021   12:47 PM 01/26/2020    1:35 PM 11/16/2018    2:35 PM 11/20/2017   10:20 AM 06/09/2013    2:50 PM  PHQ 2/9 Scores  PHQ - 2 Score 0 0 1 0 2 0 0  PHQ- 9 Score   3  4      Fall Risk    04/26/2022    8:59 AM 10/15/2021    9:23 AM 01/26/2020    1:35 PM 11/16/2018    2:34 PM 11/20/2017   10:20 AM  Fall Risk   Falls in the past year? 0 0 0 0  No  Number falls in past yr: 0 0 0    Injury with Fall? 0 0 0    Risk for fall due to :  No Fall Risks     Follow up Falls evaluation completed;Education provided Falls evaluation completed       [place]:  Any stairs in or  around the home? Yes  If so, are there any without handrails? No  Home free of loose throw rugs in walkways, pet beds, electrical cords, etc? Yes  Adequate lighting in your home to reduce risk of falls? Yes   ASSISTIVE DEVICES UTILIZED TO PREVENT FALLS:  Life alert? No  Use of a cane, walker or w/c? No  Grab bars in the bathroom? No  Shower chair or bench in shower? No  Elevated toilet seat or a handicapped toilet? No     Cognitive Function:  Normal cognitive status assessed by telephone conversation by this Nurse Health Advisor. No abnormalities found.        Immunizations Immunization History  Administered Date(s) Administered   Influenza, High Dose Seasonal PF 09/03/2018, 08/17/2019, 08/25/2020   Influenza, Quadrivalent, Recombinant, Inj, Pf 09/10/2017   Influenza,inj,Quad PF,6+ Mos 09/05/2014, 07/28/2015, 09/05/2016   Influenza-Unspecified 09/05/2014, 07/28/2015, 09/05/2016, 08/11/2017, 09/05/2021   Moderna Covid-19 Vaccine Bivalent Booster 49yr & up 11/20/2021   Moderna SARS-COV2 Booster Vaccination 06/13/2021   PFIZER(Purple Top)SARS-COV-2 Vaccination 12/02/2019, 12/23/2019, 11/16/2020   Pneumococcal Conjugate-13 01/26/2020   Pneumococcal Polysaccharide-23 10/15/2021   Tdap 11/11/2010, 10/12/2017, 11/11/2018   Zoster Recombinat (Shingrix) 07/05/2021, 09/15/2021    TDAP status: Up to date  Flu Vaccine status: Up to date  Pneumococcal vaccine status: Up to date  Covid-19 vaccine status: Completed vaccines  Qualifies for Shingles Vaccine? Yes   Zostavax completed Yes   Shingrix Completed?: Yes  Screening Tests Health Maintenance  Topic Date Due   DEXA SCAN  10/15/2022 (Originally 06/18/2017)   MAMMOGRAM  05/17/2022   INFLUENZA VACCINE  06/11/2022   COLONOSCOPY (Pts 45-446yrInsurance coverage will need to be confirmed)  01/10/2026   TETANUS/TDAP  11/11/2028   Pneumonia Vaccine 6559Years old  Completed   COVID-19 Vaccine  Completed   Hepatitis C Screening   Completed   Zoster Vaccines- Shingrix  Completed   HPV VACCINES  Aged Out    Health Maintenance  There are no preventive care reminders to display for this patient.  Colorectal cancer screening: Type of screening: Colonoscopy. Completed 01/10/2021. Repeat every 5 years  Mammogram status: Completed 05/17/2021. Repeat every year  Bone Density status: Ordered 04/26/2022. Pt provided with contact info and advised to call to schedule appt.  Lung Cancer Screening: (Low Dose CT Chest recommended if Age 478-80ears, 30 pack-year currently smoking OR have quit w/in 15years.) does not qualify.   Lung Canc/aer Screening Referral:    Additional Screening:  Hepatitis C Screening: does not qualify; Completed 12/02/2016  Vision Screening: Recommended annual ophthalmology exams for early detection of glaucoma and other disorders of the eye. Is the patient up to date with their annual eye exam?  Yes  Who is the provider or what is the name of the office in which the patient attends annual eye exams? Dr.Oman  If pt is not established with a provider, would they like to be referred to a provider to establish care? No .   Dental Screening: Recommended annual dental exams for proper oral hygiene  Community Resource Referral / Chronic Care Management: CRR required this visit?  No  CCM required this visit?  No      Plan:     I have personally reviewed and noted the following in the patient's chart:   Medical and social history Use of alcohol, tobacco or illicit drugs  Current medications and supplements including opioid prescriptions.  Functional ability and status Nutritional status Physical activity Advanced directives List of other physicians Hospitalizations, surgeries, and ER visits in previous 12 months Vitals Screenings to include cognitive, depression, and falls Referrals and appointments  In addition, I have reviewed and discussed with patient certain preventive protocols,  quality metrics, and best practice recommendations. A written personalized care plan for preventive services as well as general preventive health recommendations were provided to patient.     Randel Pigg, LPN   [id number]   Nurse Notes: none

## 2022-05-06 ENCOUNTER — Other Ambulatory Visit: Payer: Self-pay | Admitting: Internal Medicine

## 2022-05-06 DIAGNOSIS — Z1231 Encounter for screening mammogram for malignant neoplasm of breast: Secondary | ICD-10-CM

## 2022-05-21 ENCOUNTER — Other Ambulatory Visit: Payer: Self-pay

## 2022-05-27 DIAGNOSIS — L720 Epidermal cyst: Secondary | ICD-10-CM | POA: Diagnosis not present

## 2022-05-27 DIAGNOSIS — L821 Other seborrheic keratosis: Secondary | ICD-10-CM | POA: Diagnosis not present

## 2022-05-27 DIAGNOSIS — L7211 Pilar cyst: Secondary | ICD-10-CM | POA: Diagnosis not present

## 2022-05-27 DIAGNOSIS — D225 Melanocytic nevi of trunk: Secondary | ICD-10-CM | POA: Diagnosis not present

## 2022-05-27 DIAGNOSIS — L723 Sebaceous cyst: Secondary | ICD-10-CM | POA: Diagnosis not present

## 2022-05-27 DIAGNOSIS — D1801 Hemangioma of skin and subcutaneous tissue: Secondary | ICD-10-CM | POA: Diagnosis not present

## 2022-05-30 ENCOUNTER — Telehealth: Payer: Self-pay | Admitting: Internal Medicine

## 2022-05-30 NOTE — Telephone Encounter (Signed)
Caller & Relationship to patient: Danielle Pratt  Call back number: [phone]  Date of last office visit: 10/15/21  Date of next office visit:   Medication(s) to be refilled: apixaban (ELIQUIS) 5 MG TABS tablet   Preferred Pharmacy:  [id number] - [place], [address] Phone:  [phone]  Fax:  [phone]

## 2022-05-31 NOTE — Telephone Encounter (Signed)
Ok to refill 

## 2022-06-03 ENCOUNTER — Other Ambulatory Visit: Payer: Self-pay

## 2022-06-03 MED ORDER — APIXABAN 5 MG PO TABS: 5.0000 mg | ORAL_TABLET | Freq: Two times a day (BID) | ORAL | 2 refills | Status: DC

## 2022-06-03 NOTE — Telephone Encounter (Signed)
Sent in today 

## 2022-06-13 ENCOUNTER — Ambulatory Visit
Admission: RE | Admit: 2022-06-13 | Discharge: 2022-06-13 | Disposition: A | Discharge status: Home / Self Care | Payer: PPO | Source: Ambulatory Visit | Attending: Internal Medicine | Admitting: Internal Medicine

## 2022-06-13 DIAGNOSIS — Z780 Asymptomatic menopausal state: Secondary | ICD-10-CM | POA: Diagnosis not present

## 2022-06-13 DIAGNOSIS — M8588 Other specified disorders of bone density and structure, other site: Secondary | ICD-10-CM | POA: Diagnosis not present

## 2022-06-13 DIAGNOSIS — Z1231 Encounter for screening mammogram for malignant neoplasm of breast: Secondary | ICD-10-CM | POA: Diagnosis not present

## 2022-06-13 DIAGNOSIS — M810 Age-related osteoporosis without current pathological fracture: Secondary | ICD-10-CM | POA: Diagnosis not present

## 2022-06-13 LAB — HM DEXA SCAN

## 2022-06-16 ENCOUNTER — Encounter: Payer: Self-pay | Admitting: Internal Medicine

## 2022-06-16 DIAGNOSIS — M810 Age-related osteoporosis without current pathological fracture: Secondary | ICD-10-CM | POA: Insufficient documentation

## 2022-06-18 ENCOUNTER — Encounter: Payer: Self-pay | Admitting: Internal Medicine

## 2022-06-18 NOTE — Progress Notes (Signed)
Outside notes received. Information abstracted. Notes sent to scan.  

## 2022-07-10 DIAGNOSIS — H43813 Vitreous degeneration, bilateral: Secondary | ICD-10-CM | POA: Diagnosis not present

## 2022-08-13 ENCOUNTER — Other Ambulatory Visit (HOSPITAL_BASED_OUTPATIENT_CLINIC_OR_DEPARTMENT_OTHER): Payer: Self-pay

## 2022-08-13 MED ORDER — AREXVY 120 MCG/0.5ML IM SUSR
0.5000 mL | Freq: Once | INTRAMUSCULAR | 0 refills | Status: AC
  Filled 2022-08-13: qty 0.5, 1d supply, fill #0

## 2022-08-13 MED ORDER — INFLUENZA VAC A&B SA ADJ QUAD 0.5 ML IM PRSY
PREFILLED_SYRINGE | INTRAMUSCULAR | 0 refills | Status: DC
  Filled 2022-08-13: qty 0.5, 1d supply, fill #0

## 2022-09-04 ENCOUNTER — Other Ambulatory Visit: Payer: Self-pay | Admitting: Internal Medicine

## 2022-09-27 ENCOUNTER — Other Ambulatory Visit (HOSPITAL_BASED_OUTPATIENT_CLINIC_OR_DEPARTMENT_OTHER): Payer: Self-pay

## 2022-09-27 MED ORDER — COVID-19 MRNA VAC-TRIS(PFIZER) 30 MCG/0.3ML IM SUSY
0.3000 mL | PREFILLED_SYRINGE | Freq: Once | INTRAMUSCULAR | 0 refills | Status: AC
  Filled 2022-09-27: qty 0.3, 1d supply, fill #0

## 2022-10-11 ENCOUNTER — Other Ambulatory Visit (HOSPITAL_BASED_OUTPATIENT_CLINIC_OR_DEPARTMENT_OTHER): Payer: Self-pay

## 2022-10-14 ENCOUNTER — Other Ambulatory Visit: Payer: Self-pay | Admitting: Internal Medicine

## 2022-10-30 ENCOUNTER — Telehealth: Payer: PPO

## 2022-12-12 ENCOUNTER — Other Ambulatory Visit: Payer: Self-pay | Admitting: Internal Medicine

## 2022-12-17 ENCOUNTER — Encounter: Payer: Self-pay | Admitting: Internal Medicine

## 2022-12-17 NOTE — Patient Instructions (Addendum)
Blood work was ordered.   The lab is on the first floor.    Medications changes include :   decrease eliquis to 2.5 mg     Return in about 1 year (around 12/19/2023) for Physical Exam.   Health Maintenance, Female Adopting a healthy lifestyle and getting preventive care are important in promoting health and wellness. Ask your health care provider about: The right schedule for you to have regular tests and exams. Things you can do on your own to prevent diseases and keep yourself healthy. What should I know about diet, weight, and exercise? Eat a healthy diet  Eat a diet that includes plenty of vegetables, fruits, low-fat dairy products, and lean protein. Do not eat a lot of foods that are high in solid fats, added sugars, or sodium. Maintain a healthy weight Body mass index (BMI) is used to identify weight problems. It estimates body fat based on height and weight. Your health care provider can help determine your BMI and help you achieve or maintain a healthy weight. Get regular exercise Get regular exercise. This is one of the most important things you can do for your health. Most adults should: Exercise for at least 150 minutes each week. The exercise should increase your heart rate and make you sweat (moderate-intensity exercise). Do strengthening exercises at least twice a week. This is in addition to the moderate-intensity exercise. Spend less time sitting. Even light physical activity can be beneficial. Watch cholesterol and blood lipids Have your blood tested for lipids and cholesterol at 71 years of age, then have this test every 5 years. Have your cholesterol levels checked more often if: Your lipid or cholesterol levels are high. You are older than 71 years of age. You are at high risk for heart disease. What should I know about cancer screening? Depending on your health history and family history, you may need to have cancer screening at various ages. This may  include screening for: Breast cancer. Cervical cancer. Colorectal cancer. Skin cancer. Lung cancer. What should I know about heart disease, diabetes, and high blood pressure? Blood pressure and heart disease High blood pressure causes heart disease and increases the risk of stroke. This is more likely to develop in people who have high blood pressure readings or are overweight. Have your blood pressure checked: Every 3-5 years if you are 15-27 years of age. Every year if you are 61 years old or older. Diabetes Have regular diabetes screenings. This checks your fasting blood sugar level. Have the screening done: Once every three years after age 20 if you are at a normal weight and have a low risk for diabetes. More often and at a younger age if you are overweight or have a high risk for diabetes. What should I know about preventing infection? Hepatitis B If you have a higher risk for hepatitis B, you should be screened for this virus. Talk with your health care provider to find out if you are at risk for hepatitis B infection. Hepatitis C Testing is recommended for: Everyone born from [birth year] through 1964. Anyone with known risk factors for hepatitis C. Sexually transmitted infections (STIs) Get screened for STIs, including gonorrhea and chlamydia, if: You are sexually active and are younger than 71 years of age. You are older than 71 years of age and your health care provider tells you that you are at risk for this type of infection. Your sexual activity has changed since you were last screened,  and you are at increased risk for chlamydia or gonorrhea. Ask your health care provider if you are at risk. Ask your health care provider about whether you are at high risk for HIV. Your health care provider may recommend a prescription medicine to help prevent HIV infection. If you choose to take medicine to prevent HIV, you should first get tested for HIV. You should then be tested every 3 months  for as long as you are taking the medicine. Pregnancy If you are about to stop having your period (premenopausal) and you may become pregnant, seek counseling before you get pregnant. Take 400 to 800 micrograms (mcg) of folic acid every day if you become pregnant. Ask for birth control (contraception) if you want to prevent pregnancy. Osteoporosis and menopause Osteoporosis is a disease in which the bones lose minerals and strength with aging. This can result in bone fractures. If you are 83 years old or older, or if you are at risk for osteoporosis and fractures, ask your health care provider if you should: Be screened for bone loss. Take a calcium or vitamin D supplement to lower your risk of fractures. Be given hormone replacement therapy (HRT) to treat symptoms of menopause. Follow these instructions at home: Alcohol use Do not drink alcohol if: Your health care provider tells you not to drink. You are pregnant, may be pregnant, or are planning to become pregnant. If you drink alcohol: Limit how much you have to: 0-1 drink a day. Know how much alcohol is in your drink. In the [place] one drink equals one 12 oz bottle of beer (355 mL), one 5 oz glass of wine (148 mL), or one 1 oz glass of hard liquor (44 mL). Lifestyle Do not use any products that contain nicotine or tobacco. These products include cigarettes, chewing tobacco, and vaping devices, such as e-cigarettes. If you need help quitting, ask your health care provider. Do not use street drugs. Do not share needles. Ask your health care provider for help if you need support or information about quitting drugs. General instructions Schedule regular health, dental, and eye exams. Stay current with your vaccines. Tell your health care provider if: You often feel depressed. You have ever been abused or do not feel safe at home. Summary Adopting a healthy lifestyle and getting preventive care are important in promoting health and  wellness. Follow your health care provider's instructions about healthy diet, exercising, and getting tested or screened for diseases. Follow your health care provider's instructions on monitoring your cholesterol and blood pressure. This information is not intended to replace advice given to you by your health care provider. Make sure you discuss any questions you have with your health care provider. Document Revised: 03/19/2021 Document Reviewed: 03/19/2021 Elsevier Patient Education  2023 [employer].

## 2022-12-17 NOTE — Progress Notes (Unsigned)
Subjective:    Patient ID: Danielle Pratt, female    DOB: 06/09/1951, 72 y.o.   MRN: [id number]      HPI Danielle Pratt is here for a Physical exam.   Doing well overall.  Her father died this past year and that was difficult, but she thinks she is dealing with it well and has turned the corner.  Medications and allergies reviewed with patient and updated if appropriate.  Current Outpatient Medications on File Prior to Visit  Medication Sig Dispense Refill   Cholecalciferol (VITAMIN D3) 125 MCG (5000 UT) CAPS Take 1 capsule by mouth daily.     clobetasol cream (TEMOVATE) 0.62 % Apply 1 application topically at bedtime as needed (for skin irritation/finger).      Cyanocobalamin (VITAMIN B-12 PO) Take 2,500 mcg by mouth daily.     levothyroxine (SYNTHROID) 100 MCG tablet TAKE ONE TABLET BY MOUTH DAILY 90 tablet 3   METAMUCIL FIBER PO Take 3-4 capsules by mouth daily as needed.     pilocarpine (SALAGEN) 5 MG tablet Take 5 mg by mouth See admin instructions. Take 1 tablet (5 mg) by mouth 3 times daily - early morning, midday and bedtime, may take an additional tablet during the day as needed for dry mouth.     tretinoin (RETIN-A) 0.05 % cream Apply 1 application topically at bedtime.     No current facility-administered medications on file prior to visit.    Review of Systems  Constitutional:  Negative for chills and fever.  Eyes:  Negative for visual disturbance.  Respiratory:  Positive for cough (sometimes). Negative for shortness of breath and wheezing.   Cardiovascular:  Negative for chest pain, palpitations and leg swelling.  Gastrointestinal:  Negative for abdominal pain, blood in stool, constipation and diarrhea.       Occ gerd  Genitourinary:  Negative for dysuria.  Musculoskeletal:  Negative for arthralgias and back pain.  Skin:  Negative for rash.  Neurological:  Negative for light-headedness and headaches.  Psychiatric/Behavioral:  Negative for dysphoric mood. The patient  is not nervous/anxious (controlled).        Objective:   Vitals:   12/18/22 0758  BP: 118/76  Pulse: 82  Temp: 97.9 F (36.6 C)  SpO2: 96%   Filed Weights   12/18/22 0758  Weight: 158 lb (71.7 kg)   Body mass index is 23.33 kg/m.  BP Readings from Last 3 Encounters:  12/18/22 118/76  10/15/21 118/76  03/16/21 132/82    Wt Readings from Last 3 Encounters:  12/18/22 158 lb (71.7 kg)  10/15/21 153 lb (69.4 kg)  03/16/21 149 lb 6.4 oz (67.8 kg)       Physical Exam Constitutional: She appears well-developed and well-nourished. No distress.  HENT:  Head: Normocephalic and atraumatic.  Right Ear: External ear normal. Normal ear canal and TM Left Ear: External ear normal.  Normal ear canal and TM Mouth/Throat: Oropharynx is clear and moist.  Eyes: Conjunctivae normal.  Neck: Neck supple. No tracheal deviation present. No thyromegaly present.  No carotid bruit  Cardiovascular: Normal rate, regular rhythm and normal heart sounds.   No murmur heard.  No edema. Pulmonary/Chest: Effort normal and breath sounds normal. No respiratory distress. She has no wheezes. She has no rales.  Breast: deferred   Abdominal: Soft. She exhibits no distension. There is no tenderness.  Lymphadenopathy: She has no cervical adenopathy.  Skin: Skin is warm and dry. She is not diaphoretic.  Psychiatric: She has a normal  mood and affect. Her behavior is normal.     Lab Results  Component Value Date   WBC 4.7 10/15/2021   HGB 13.7 10/15/2021   HCT 42.4 10/15/2021   PLT 253.0 10/15/2021   GLUCOSE 82 10/15/2021   CHOL 219 (H) 10/15/2021   TRIG 88.0 10/15/2021   HDL 64.60 10/15/2021   LDLDIRECT 161.1 06/09/2013   LDLCALC 137 (H) 10/15/2021   ALT 13 10/15/2021   AST 19 10/15/2021   NA 139 10/15/2021   K 4.5 10/15/2021   CL 102 10/15/2021   CREATININE 0.93 10/15/2021   BUN 17 10/15/2021   CO2 31 10/15/2021   TSH 0.84 10/15/2021   INR 1.15 02/25/2018   HGBA1C 5.6 02/01/2016          Assessment & Plan:   Physical exam: Screening blood work  ordered Exercise  none - stressed regular exercise Weight  normal Substance abuse  none   Reviewed recommended immunizations.   Health Maintenance  Topic Date Due   COVID-19 Vaccine (6 - 2023-24 season) 01/02/2023 (Originally 11/22/2022)   Medicare Annual Wellness (AWV)  04/27/2023   MAMMOGRAM  06/14/2023   DEXA SCAN  06/13/2024   COLONOSCOPY (Pts 45-80yr Insurance coverage will need to be confirmed)  01/10/2026   DTaP/Tdap/Td (4 - Td or Tdap) 11/11/2028   Pneumonia Vaccine 72 Years old  Completed   INFLUENZA VACCINE  Completed   Hepatitis C Screening  Completed   Zoster Vaccines- Shingrix  Completed   HPV VACCINES  Aged Out          See Problem List for Assessment and Plan of chronic medical problems.

## 2022-12-17 NOTE — Assessment & Plan Note (Addendum)
DEXA done 06/2022-left radius -2.5, spine -1.8 Taking vitamin D daily She is exercising regularly

## 2022-12-18 ENCOUNTER — Ambulatory Visit (INDEPENDENT_AMBULATORY_CARE_PROVIDER_SITE_OTHER): Payer: PPO | Admitting: Internal Medicine

## 2022-12-18 ENCOUNTER — Other Ambulatory Visit: Payer: Self-pay

## 2022-12-18 VITALS — BP 118/76 | HR 82 | Temp 97.9°F | Ht 69.0 in | Wt 158.0 lb

## 2022-12-18 DIAGNOSIS — E782 Mixed hyperlipidemia: Secondary | ICD-10-CM | POA: Diagnosis not present

## 2022-12-18 DIAGNOSIS — Z86718 Personal history of other venous thrombosis and embolism: Secondary | ICD-10-CM

## 2022-12-18 DIAGNOSIS — E559 Vitamin D deficiency, unspecified: Secondary | ICD-10-CM | POA: Diagnosis not present

## 2022-12-18 DIAGNOSIS — E034 Atrophy of thyroid (acquired): Secondary | ICD-10-CM | POA: Diagnosis not present

## 2022-12-18 DIAGNOSIS — F419 Anxiety disorder, unspecified: Secondary | ICD-10-CM | POA: Diagnosis not present

## 2022-12-18 DIAGNOSIS — Z0000 Encounter for general adult medical examination without abnormal findings: Secondary | ICD-10-CM

## 2022-12-18 DIAGNOSIS — M810 Age-related osteoporosis without current pathological fracture: Secondary | ICD-10-CM | POA: Diagnosis not present

## 2022-12-18 LAB — COMPREHENSIVE METABOLIC PANEL
ALT: 12 U/L (ref 0–35)
AST: 15 U/L (ref 0–37)
Albumin: 4.7 g/dL (ref 3.5–5.2)
Alkaline Phosphatase: 92 U/L (ref 39–117)
BUN: 15 mg/dL (ref 6–23)
CO2: 31 mEq/L (ref 19–32)
Calcium: 9.8 mg/dL (ref 8.4–10.5)
Chloride: 100 mEq/L (ref 96–112)
Creatinine, Ser: 0.96 mg/dL (ref 0.40–1.20)
GFR: 59.95 mL/min — ABNORMAL LOW (ref >60.00)
Glucose, Bld: 89 mg/dL (ref 70–99)
Potassium: 4.4 mEq/L (ref 3.5–5.1)
Sodium: 139 mEq/L (ref 135–145)
Total Bilirubin: 0.7 mg/dL (ref 0.2–1.2)
Total Protein: 7.3 g/dL (ref 6.0–8.3)

## 2022-12-18 LAB — CBC WITH DIFFERENTIAL/PLATELET
Basophils Absolute: 0.1 10*3/uL (ref 0.0–0.1)
Basophils Relative: 0.9 % (ref 0.0–3.0)
Eosinophils Absolute: 0.2 10*3/uL (ref 0.0–0.7)
Eosinophils Relative: 2.9 % (ref 0.0–5.0)
HCT: 42.5 % (ref 36.0–46.0)
Hemoglobin: 14.3 g/dL (ref 12.0–15.0)
Lymphocytes Relative: 28.6 % (ref 12.0–46.0)
Lymphs Abs: 2 10*3/uL (ref 0.7–4.0)
MCHC: 33.7 g/dL (ref 30.0–36.0)
MCV: 90.9 fl (ref 78.0–100.0)
Monocytes Absolute: 0.6 10*3/uL (ref 0.1–1.0)
Monocytes Relative: 8.2 % (ref 3.0–12.0)
Neutro Abs: 4.1 10*3/uL (ref 1.4–7.7)
Neutrophils Relative %: 59.4 % (ref 43.0–77.0)
Platelets: 267 10*3/uL (ref 150.0–400.0)
RBC: 4.68 Mil/uL (ref 3.87–5.11)
RDW: 14.3 % (ref 11.5–15.5)
WBC: 6.8 10*3/uL (ref 4.0–10.5)

## 2022-12-18 LAB — LIPID PANEL
Cholesterol: 256 mg/dL — ABNORMAL HIGH (ref 0–200)
HDL: 70.1 mg/dL (ref >39.00)
LDL Cholesterol: 170 mg/dL — ABNORMAL HIGH (ref 0–99)
NonHDL: 185.41
Total CHOL/HDL Ratio: 4
Triglycerides: 79 mg/dL (ref 0.0–149.0)
VLDL: 15.8 mg/dL (ref 0.0–40.0)

## 2022-12-18 LAB — TSH: TSH: 3.76 u[IU]/mL (ref 0.35–5.50)

## 2022-12-18 LAB — VITAMIN D 25 HYDROXY (VIT D DEFICIENCY, FRACTURES): VITD: 94.11 ng/mL (ref 30.00–100.00)

## 2022-12-18 MED ORDER — APIXABAN 2.5 MG PO TABS: 2.5000 mg | ORAL_TABLET | Freq: Two times a day (BID) | ORAL | 3 refills | Status: DC

## 2022-12-18 MED ORDER — VENLAFAXINE HCL ER 75 MG PO CP24: ORAL_CAPSULE | ORAL | 3 refills | Status: DC

## 2022-12-18 MED ORDER — APIXABAN 5 MG PO TABS: 5.0000 mg | ORAL_TABLET | Freq: Two times a day (BID) | ORAL | 2 refills | Status: DC

## 2022-12-18 NOTE — Assessment & Plan Note (Signed)
Chronic Regular exercise and healthy diet encouraged Check lipid panel  Continue lifestyle controlled

## 2022-12-18 NOTE — Assessment & Plan Note (Addendum)
Chronic DVT 01/2016-Xarelto x 6 months PE 11/2017 Needs lifelong anticoagulation Will decrease Eliquis dose to 2.5 mg twice daily CBC, CMP

## 2022-12-18 NOTE — Assessment & Plan Note (Signed)
Chronic  Clinically euthyroid Check tsh and will titrate med dose if needed Currently taking levothyroxine 100 mcg daily   

## 2022-12-18 NOTE — Assessment & Plan Note (Signed)
Chronic Controlled, stable Continue effexor 75 mg daily  

## 2022-12-18 NOTE — Assessment & Plan Note (Signed)
Chronic Taking vitamin D daily Check vitamin D level  

## 2023-03-12 ENCOUNTER — Ambulatory Visit: Payer: PPO | Attending: Cardiovascular Disease | Admitting: Cardiovascular Disease

## 2023-03-12 ENCOUNTER — Encounter: Payer: Self-pay | Admitting: Cardiovascular Disease

## 2023-03-12 VITALS — BP 127/84 | HR 85 | Ht 69.0 in | Wt 152.6 lb

## 2023-03-12 DIAGNOSIS — I2721 Secondary pulmonary arterial hypertension: Secondary | ICD-10-CM | POA: Diagnosis not present

## 2023-03-12 DIAGNOSIS — I2782 Chronic pulmonary embolism: Secondary | ICD-10-CM | POA: Diagnosis not present

## 2023-03-12 DIAGNOSIS — E785 Hyperlipidemia, unspecified: Secondary | ICD-10-CM | POA: Diagnosis not present

## 2023-03-12 DIAGNOSIS — E782 Mixed hyperlipidemia: Secondary | ICD-10-CM | POA: Diagnosis not present

## 2023-03-12 MED ORDER — ROSUVASTATIN CALCIUM 10 MG PO TABS: 10.0000 mg | ORAL_TABLET | Freq: Every day | ORAL | 3 refills | Status: DC

## 2023-03-12 NOTE — Assessment & Plan Note (Signed)
History of hyperlipidemia with recent lipid profile performed 12/18/2022 revealing total cholesterol 256, LDL 178 and HDL 70.  Given her normal coronaries her LDL goal is less than 100.  I am going to begin her on rosuvastatin 10 mg a day and we will recheck a lipid liver profile in 3 months.

## 2023-03-12 NOTE — Assessment & Plan Note (Signed)
History of pulmonary hypertension secondary to chronic pulmonary thromboembolic disease status post embolectomy with normalization of pressures and symptoms.

## 2023-03-12 NOTE — Assessment & Plan Note (Signed)
History of multiple pulmonary emboli status post pulmonary embolectomy by Dr. Kennon Rounds at [facility] 6/19.  After that, her symptoms of dyspnea resolved she came off O2.  Her most recent 2D echo revealed normal LV and RV function with normal function and normal pressures on 04/06/2020.

## 2023-03-12 NOTE — Progress Notes (Signed)
03/12/2023 Danielle Pratt   1951/11/02  [id number]  Primary Physician Lawerance Bach, Bobette Mo, MD Primary Cardiologist: Runell Gess MD Milagros Loll, [place], [place]  HPI:  Danielle Pratt is a 72 y.o.   thin and fit-appearing married Caucasian female mother of 2, grandmother and 2 grandchildren who I last saw in the office 03/16/2021.  He was referred by Dr. Gweneth Dimitri , hematology oncology, for evaluation and treatment of chronic pulmonary thromboembolic disease.she has no cardiac risk factors. She had her first episode of pulmonary emboli back in 1994 and her second episode March 2017 that it occurred in association with decreased mobility due to a brace applied for dislocation of an implanted hip. She was on oral anticoagulation for 6 months after that. She did have tibial DVT as well. She had recurrent shortness of breath and recent admission in January demonstrating increased pulmonary embolic load although her RV LV ratio did not warrant local thrombolytic therapy. She is on chronic O2 to keep recess greater than 90%. Should she has mild limitations regarding dyspnea. 2-D echo revealed normal LV systolic function, mildly dilated and hypokinetic right ventricle but her pulmonary pressures were not elevated. She is on Eliquis at this time. Venous Dopplers were negative. She apparently has undergone a thrombophilia workup as well.   I referred her to Dr. Gala Romney who did a right left heart cath revealing normal coronary arteries.  When he put a pigtail catheter in her LV she developed complete heart block which ultimately resolved spontaneously.  He did refer her to Dr. Kennon Rounds at [facility] who did a pulmonary embolectomy on her 6/19 resulting in marked improvement in her clinical status.  She no longer is on oxygen feet.  She denies chest pain or shortness of breath.  Her last 2D echo performed 04/06/2020 revealed normal LV systolic function and normal RV systolic function.  Since I saw her 2 years ago she is  remained stable.  She is no longer on oxygen.  She is fairly active.  She denies chest pain or shortness of breath.  She does have moderate hyperlipidemia however.   Current Meds  Medication Sig   apixaban (ELIQUIS) 2.5 MG TABS tablet Take 1 tablet (2.5 mg total) by mouth 2 (two) times daily.   Cholecalciferol (VITAMIN D3) 50 MCG (2000 UT) capsule Take 1 capsule by mouth daily.   clobetasol cream (TEMOVATE) 0.05 % Apply 1 application topically at bedtime as needed (for skin irritation/finger).    Cyanocobalamin (VITAMIN B-12 PO) Take 2,500 mcg by mouth daily.   levothyroxine (SYNTHROID) 100 MCG tablet TAKE ONE TABLET BY MOUTH DAILY   METAMUCIL FIBER PO Take 3-4 capsules by mouth daily as needed.   pilocarpine (SALAGEN) 5 MG tablet Take 5 mg by mouth See admin instructions. Take 1 tablet (5 mg) by mouth 3 times daily - early morning, midday and bedtime, may take an additional tablet during the day as needed for dry mouth.   rosuvastatin (CRESTOR) 10 MG tablet Take 1 tablet (10 mg total) by mouth daily.   tretinoin (RETIN-A) 0.05 % cream Apply 1 application topically at bedtime.   venlafaxine XR (EFFEXOR-XR) 75 MG 24 hr capsule TAKE ONE CAPSULE BY MOUTH EVERY NIGHT AT BEDTIME     Allergies  Allergen Reactions   Omnicef [Cefdinir]     Facial swelling    Social History   Socioeconomic History   Marital status: Married    Spouse name: Not on file   Number of children:  Not on file   Years of education: Not on file   Highest education level: Not on file  Occupational History   Not on file  Tobacco Use   Smoking status: Never   Smokeless tobacco: Never  Vaping Use   Vaping Use: Never used  Substance and Sexual Activity   Alcohol use: Yes    Alcohol/week: 3.0 standard drinks of alcohol    Types: 3 Cans of beer per week   Drug use: No   Sexual activity: Yes  Other Topics Concern   Not on file  Social History Narrative   Not on file   Social Determinants of Health   Financial  Resource Strain: Low Risk  (04/26/2022)   Overall Financial Resource Strain (CARDIA)    Difficulty of Paying Living Expenses: Not hard at all  Food Insecurity: No Food Insecurity (04/26/2022)   Hunger Vital Sign    Worried About Running Out of Food in the Last Year: Never true    Ran Out of Food in the Last Year: Never true  Transportation Needs: No Transportation Needs (04/26/2022)   PRAPARE - Administrator, Civil Service (Medical): No    Lack of Transportation (Non-Medical): No  Physical Activity: Insufficiently Active (04/26/2022)   Exercise Vital Sign    Days of Exercise per Week: 3 days    Minutes of Exercise per Session: 30 min  Stress: No Stress Concern Present (04/26/2022)   [employer] of Occupational Health - Occupational Stress Questionnaire    Feeling of Stress : Only a little  Social Connections: Moderately Integrated (04/26/2022)   Social Connection and Isolation Panel [NHANES]    Frequency of Communication with Friends and Family: Three times a week    Frequency of Social Gatherings with Friends and Family: Three times a week    Attends Religious Services: 1 to 4 times per year    Active Member of Clubs or Organizations: No    Attends Banker Meetings: Never    Marital Status: Married  Catering manager Violence: Not At Risk (04/26/2022)   Humiliation, Afraid, Rape, and Kick questionnaire    Fear of Current or Ex-Partner: No    Emotionally Abused: No    Physically Abused: No    Sexually Abused: No     Review of Systems: General: negative for chills, fever, night sweats or weight changes.  Cardiovascular: negative for chest pain, dyspnea on exertion, edema, orthopnea, palpitations, paroxysmal nocturnal dyspnea or shortness of breath Dermatological: negative for rash Respiratory: negative for cough or wheezing Urologic: negative for hematuria Abdominal: negative for nausea, vomiting, diarrhea, bright red blood per rectum, melena, or  hematemesis Neurologic: negative for visual changes, syncope, or dizziness All other systems reviewed and are otherwise negative except as noted above.    Blood pressure 127/84, pulse 85, height 5\' 9"  (1.753 m), weight 152 lb 9.6 oz (69.2 kg), SpO2 98 %.  General appearance: alert and no distress Neck: no adenopathy, no carotid bruit, no JVD, supple, symmetrical, trachea midline, and thyroid not enlarged, symmetric, no tenderness/mass/nodules Lungs: clear to auscultation bilaterally Heart: regular rate and rhythm, S1, S2 normal, no murmur, click, rub or gallop Extremities: extremities normal, atraumatic, no cyanosis or edema Pulses: 2+ and symmetric Skin: Skin color, texture, turgor normal. No rashes or lesions Neurologic: Grossly normal  EKG sinus rhythm at 91 with a nonspecific IVCD.  I personally reviewed this EKG.  ASSESSMENT AND PLAN:   Pulmonary emboli (HCC) History of multiple pulmonary emboli status  post pulmonary embolectomy by Dr. Kennon Rounds at [facility] 6/19.  After that, her symptoms of dyspnea resolved she came off O2.  Her most recent 2D echo revealed normal LV and RV function with normal function and normal pressures on 04/06/2020.  PAH (pulmonary artery hypertension) (HCC) History of pulmonary hypertension secondary to chronic pulmonary thromboembolic disease status post embolectomy with normalization of pressures and symptoms.  History of DVT (deep vein thrombosis) and PE History of multiple DVTs and chronic pulmonary emboli in the past thought to be related to a "thrombophilia" currently on Eliquis oral anticoagulation.  Her dose was decreased by her PCP from 5 mg to 2.5 mg twice daily.  Hyperlipidemia, mixed History of hyperlipidemia with recent lipid profile performed 12/18/2022 revealing total cholesterol 256, LDL 178 and HDL 70.  Given her normal coronaries her LDL goal is less than 100.  I am going to begin her on rosuvastatin 10 mg a day and we will recheck a lipid liver  profile in 3 months.  LEFT BUNDLE BRANCH BLOCK Chronic     Runell Gess MD [facility], [facility] 03/12/2023 3:21 PM

## 2023-03-12 NOTE — Assessment & Plan Note (Signed)
Chronic. 

## 2023-03-12 NOTE — Assessment & Plan Note (Signed)
History of multiple DVTs and chronic pulmonary emboli in the past thought to be related to a "thrombophilia" currently on Eliquis oral anticoagulation.  Her dose was decreased by her PCP from 5 mg to 2.5 mg twice daily.

## 2023-03-12 NOTE — Patient Instructions (Addendum)
Medication Instructions:   START Rosuvastatin (Crestor) 10 mg daily   *If you need a refill on your cardiac medications before your next appointment, please call your pharmacy*  Lab Work: Your physician recommends that you return for lab work in 3 months:  Fasting Lipid Panel-DO NOT eat or drink past midnight. Okay to have water and/or black coffee only the morning of lab work. Hepatic (Liver) Function Test   If you have labs (blood work) drawn today and your tests are completely normal, you will receive your results only by: MyChart Message (if you have MyChart) OR A paper copy in the mail If you have any lab test that is abnormal or we need to change your treatment, we will call you to review the results.  Testing/Procedures: NONE ordered at this time of appointment    Follow-Up: At [facility], you and your health needs are our priority.  As part of our continuing mission to provide you with exceptional heart care, we have created designated Provider Care Teams.  These Care Teams include your primary Cardiologist (physician) and Advanced Practice Providers (APPs -  Physician Assistants and Nurse Practitioners) who all work together to provide you with the care you need, when you need it.   Your next appointment:   1 year(s)  Provider:   Nanetta Batty, MD     Other Instructions

## 2023-04-04 ENCOUNTER — Telehealth: Payer: PPO | Admitting: Physician Assistant

## 2023-04-04 DIAGNOSIS — U071 COVID-19: Secondary | ICD-10-CM | POA: Diagnosis not present

## 2023-04-04 MED ORDER — MOLNUPIRAVIR EUA 200MG CAPSULE
4.0000 | ORAL_CAPSULE | Freq: Two times a day (BID) | ORAL | 0 refills | Status: AC
Start: 2023-04-04 — End: 2023-04-09

## 2023-04-04 NOTE — Patient Instructions (Signed)
Jamal Maes, thank you for joining Margaretann Loveless, PA-C for today's virtual visit.  While this provider is not your primary care provider (PCP), if your PCP is located in our provider database this encounter information will be shared with them immediately following your visit.   A [facility] MyChart account gives you access to today's visit and all your visits, tests, and labs performed at [facility] " click here if you don't have a [facility] MyChart account or go to [email or web address]  Consent: (Patient) Danielle Pratt provided verbal consent for this virtual visit at the beginning of the encounter.  Current Medications:  Current Outpatient Medications:    molnupiravir EUA (LAGEVRIO) 200 mg CAPS capsule, Take 4 capsules (800 mg total) by mouth 2 (two) times daily for 5 days., Disp: 40 capsule, Rfl: 0   apixaban (ELIQUIS) 2.5 MG TABS tablet, Take 1 tablet (2.5 mg total) by mouth 2 (two) times daily., Disp: 180 tablet, Rfl: 3   Cholecalciferol (VITAMIN D3) 50 MCG (2000 UT) capsule, Take 1 capsule by mouth daily., Disp: , Rfl:    clobetasol cream (TEMOVATE) 0.05 %, Apply 1 application topically at bedtime as needed (for skin irritation/finger). , Disp: , Rfl:    Cyanocobalamin (VITAMIN B-12 PO), Take 2,500 mcg by mouth daily., Disp: , Rfl:    levothyroxine (SYNTHROID) 100 MCG tablet, TAKE ONE TABLET BY MOUTH DAILY, Disp: 90 tablet, Rfl: 3   METAMUCIL FIBER PO, Take 3-4 capsules by mouth daily as needed., Disp: , Rfl:    pilocarpine (SALAGEN) 5 MG tablet, Take 5 mg by mouth See admin instructions. Take 1 tablet (5 mg) by mouth 3 times daily - early morning, midday and bedtime, may take an additional tablet during the day as needed for dry mouth., Disp: , Rfl:    rosuvastatin (CRESTOR) 10 MG tablet, Take 1 tablet (10 mg total) by mouth daily., Disp: 90 tablet, Rfl: 3   tretinoin (RETIN-A) 0.05 % cream, Apply 1 application topically at bedtime., Disp: , Rfl:     venlafaxine XR (EFFEXOR-XR) 75 MG 24 hr capsule, TAKE ONE CAPSULE BY MOUTH EVERY NIGHT AT BEDTIME, Disp: 90 capsule, Rfl: 3   Medications ordered in this encounter:  Meds ordered this encounter  Medications   molnupiravir EUA (LAGEVRIO) 200 mg CAPS capsule    Sig: Take 4 capsules (800 mg total) by mouth 2 (two) times daily for 5 days.    Dispense:  40 capsule    Refill:  0    Order Specific Question:   Supervising Provider    Answer:   Merrilee Jansky [id number]     *If you need refills on other medications prior to your next appointment, please contact your pharmacy*  Follow-Up: Call back or seek an in-person evaluation if the symptoms worsen or if the condition fails to improve as anticipated.  [facility] [phone]  Care Instructions: Molnupiravir Capsules What is this medication? MOLNUPIRAVIR (MOL nue PIR a vir) treats mild to moderate COVID-19. It may help people who are at high risk of developing severe illness. This medication works by limiting the spread of the virus in your body. The FDA has allowed the emergency use of this medication. This medicine may be used for other purposes; ask your health care provider or pharmacist if you have questions. COMMON BRAND NAME(S): LAGEVRIO What should I tell my care team before I take this medication? They need to know if you have any of these conditions: Any  allergies Any serious illness An unusual or allergic reaction to molnupiravir, other medications, foods, dyes, or preservatives Pregnant or trying to get pregnant Breast-feeding How should I use this medication? Take this medication by mouth with water. Take it as directed on the prescription label at the same time every day. Do not cut, crush, or chew this medication. Swallow the capsules whole. You can take it with or without food. If it upsets your stomach, take it with food. Take all of it unless your care team tells you to stop it early. Keep taking it  even if you think you are better. Talk to your care team about the use of this medication in children. Special care may be needed. Overdosage: If you think you have taken too much of this medicine contact a poison control center or emergency room at once. NOTE: This medicine is only for you. Do not share this medicine with others. What if I miss a dose? If you miss a dose, take it as soon as you can unless it is more than 10 hours late. If it is more than 10 hours late, skip the missed dose. Take the next dose at the normal time. Do not take extra or 2 doses at the same time to make up for the missed dose. What may interact with this medication? Interactions have not been studied. This list may not describe all possible interactions. Give your health care provider a list of all the medicines, herbs, non-prescription drugs, or dietary supplements you use. Also tell them if you smoke, drink alcohol, or use illegal drugs. Some items may interact with your medicine. What should I watch for while using this medication? Your condition will be monitored carefully while you are receiving this medication. Visit your care team for regular checkups. Tell your care team if your symptoms do not start to get better or if they get worse. Do not become pregnant while taking this medication. You may need a pregnancy test before starting this medication. Women must use a reliable form of birth control while taking this medication and for 4 days after stopping the medication. Women should inform their care team if they wish to become pregnant or think they might be pregnant. Men should not father a child while taking this medication and for 3 months after stopping it. There is potential for serious harm to an unborn child. Talk to your care team for more information. Do not breast-feed an infant while taking this medication and for 4 days after stopping the medication. What side effects may I notice from receiving this  medication? Side effects that you should report to your care team as soon as possible: Allergic reactions--skin rash, itching, hives, swelling of the face, lips, tongue, or throat Side effects that usually do not require medical attention (report these to your care team if they continue or are bothersome): Diarrhea Dizziness Nausea This list may not describe all possible side effects. Call your doctor for medical advice about side effects. You may report side effects to FDA at 1-800-FDA-1088. Where should I keep my medication? Keep out of the reach of children and pets. Store at room temperature between 20 and 25 degrees C (68 and 77 degrees F). Get rid of any unused medication after the expiration date. To get rid of medications that are no longer needed or have expired: Take the medication to a medication take-back program. Check with your pharmacy or law enforcement to find a location. If you cannot return  the medication, check the label or package insert to see if the medication should be thrown out in the garbage or flushed down the toilet. If you are not sure, ask your care team. If it is safe to put it in the trash, take the medication out of the container. Mix the medication with cat litter, dirt, coffee grounds, or other unwanted substance. Seal the mixture in a bag or container. Put it in the trash. NOTE: This sheet is a summary. It may not cover all possible information. If you have questions about this medicine, talk to your doctor, pharmacist, or health care provider.  2024 Elsevier/Gold Standard (2021-12-24 00:00:00)    Isolation Instructions: You are to isolate at home until you have been fever free for at least 24 hours without a fever-reducing medication, and symptoms have been steadily improving for 24 hours. At that time,  you can end isolation but need to mask for an additional 5 days.   If you must be around other household members who do not have symptoms, you need to make  sure that both you and the family members are masking consistently with a high-quality mask.  If you note any worsening of symptoms despite treatment, please seek an in-person evaluation ASAP. If you note any significant shortness of breath or any chest pain, please seek ER evaluation. Please do not delay care!   COVID-19: What to Do if You Are Sick If you test positive and are an older adult or someone who is at high risk of getting very sick from COVID-19, treatment may be available. Contact a healthcare provider right away after a positive test to determine if you are eligible, even if your symptoms are mild right now. You can also visit a Test to Treat location and, if eligible, receive a prescription from a provider. Don't delay: Treatment must be started within the first few days to be effective. If you have a fever, cough, or other symptoms, you might have COVID-19. Most people have mild illness and are able to recover at home. If you are sick: Keep track of your symptoms. If you have an emergency warning sign (including trouble breathing), call 911. Steps to help prevent the spread of COVID-19 if you are sick If you are sick with COVID-19 or think you might have COVID-19, follow the steps below to care for yourself and to help protect other people in your home and community. Stay home except to get medical care Stay home. Most people with COVID-19 have mild illness and can recover at home without medical care. Do not leave your home, except to get medical care. Do not visit public areas and do not go to places where you are unable to wear a mask. Take care of yourself. Get rest and stay hydrated. Take over-the-counter medicines, such as acetaminophen, to help you feel better. Stay in touch with your doctor. Call before you get medical care. Be sure to get care if you have trouble breathing, or have any other emergency warning signs, or if you think it is an emergency. Avoid public  transportation, ride-sharing, or taxis if possible. Get tested If you have symptoms of COVID-19, get tested. While waiting for test results, stay away from others, including staying apart from those living in your household. Get tested as soon as possible after your symptoms start. Treatments may be available for people with COVID-19 who are at risk for becoming very sick. Don't delay: Treatment must be started early to be effective--some  treatments must begin within 5 days of your first symptoms. Contact your healthcare provider right away if your test result is positive to determine if you are eligible. Self-tests are one of several options for testing for the virus that causes COVID-19 and may be more convenient than laboratory-based tests and point-of-care tests. Ask your healthcare provider or your local health department if you need help interpreting your test results. You can visit your state, tribal, local, and territorial health department's website to look for the latest local information on testing sites. Separate yourself from other people As much as possible, stay in a specific room and away from other people and pets in your home. If possible, you should use a separate bathroom. If you need to be around other people or animals in or outside of the home, wear a well-fitting mask. Tell your close contacts that they may have been exposed to COVID-19. An infected person can spread COVID-19 starting 48 hours (or 2 days) before the person has any symptoms or tests positive. By letting your close contacts know they may have been exposed to COVID-19, you are helping to protect everyone. See COVID-19 and Animals if you have questions about pets. If you are diagnosed with COVID-19, someone from the health department may call you. Answer the call to slow the spread. Monitor your symptoms Symptoms of COVID-19 include fever, cough, or other symptoms. Follow care instructions from your healthcare  provider and local health department. Your local health authorities may give instructions on checking your symptoms and reporting information. When to seek emergency medical attention Look for emergency warning signs* for COVID-19. If someone is showing any of these signs, seek emergency medical care immediately: Trouble breathing Persistent pain or pressure in the chest New confusion Inability to wake or stay awake Pale, gray, or blue-colored skin, lips, or nail beds, depending on skin tone *This list is not all possible symptoms. Please call your medical provider for any other symptoms that are severe or concerning to you. Call 911 or call ahead to your local emergency facility: Notify the operator that you are seeking care for someone who has or may have COVID-19. Call ahead before visiting your doctor Call ahead. Many medical visits for routine care are being postponed or done by phone or telemedicine. If you have a medical appointment that cannot be postponed, call your doctor's office, and tell them you have or may have COVID-19. This will help the office protect themselves and other patients. If you are sick, wear a well-fitting mask You should wear a mask if you must be around other people or animals, including pets (even at home). Wear a mask with the best fit, protection, and comfort for you. You don't need to wear the mask if you are alone. If you can't put on a mask (because of trouble breathing, for example), cover your coughs and sneezes in some other way. Try to stay at least 6 feet away from other people. This will help protect the people around you. Masks should not be placed on young children under age 49 years, anyone who has trouble breathing, or anyone who is not able to remove the mask without help. Cover your coughs and sneezes Cover your mouth and nose with a tissue when you cough or sneeze. Throw away used tissues in a lined trash can. Immediately wash your hands with  soap and water for at least 20 seconds. If soap and water are not available, clean your hands with an alcohol-based  hand sanitizer that contains at least 60% alcohol. Clean your hands often Wash your hands often with soap and water for at least 20 seconds. This is especially important after blowing your nose, coughing, or sneezing; going to the bathroom; and before eating or preparing food. Use hand sanitizer if soap and water are not available. Use an alcohol-based hand sanitizer with at least 60% alcohol, covering all surfaces of your hands and rubbing them together until they feel dry. Soap and water are the best option, especially if hands are visibly dirty. Avoid touching your eyes, nose, and mouth with unwashed hands. Handwashing Tips Avoid sharing personal household items Do not share dishes, drinking glasses, cups, eating utensils, towels, or bedding with other people in your home. Wash these items thoroughly after using them with soap and water or put in the dishwasher. Clean surfaces in your home regularly Clean and disinfect high-touch surfaces (for example, doorknobs, tables, handles, light switches, and countertops) in your "sick room" and bathroom. In shared spaces, you should clean and disinfect surfaces and items after each use by the person who is ill. If you are sick and cannot clean, a caregiver or other person should only clean and disinfect the area around you (such as your bedroom and bathroom) on an as needed basis. Your caregiver/other person should wait as long as possible (at least several hours) and wear a mask before entering, cleaning, and disinfecting shared spaces that you use. Clean and disinfect areas that may have blood, stool, or body fluids on them. Use household cleaners and disinfectants. Clean visible dirty surfaces with household cleaners containing soap or detergent. Then, use a household disinfectant. Use a product from [employer] List N: Disinfectants for  Coronavirus (COVID-19). Be sure to follow the instructions on the label to ensure safe and effective use of the product. Many products recommend keeping the surface wet with a disinfectant for a certain period of time (look at "contact time" on the product label). You may also need to wear personal protective equipment, such as gloves, depending on the directions on the product label. Immediately after disinfecting, wash your hands with soap and water for 20 seconds. For completed guidance on cleaning and disinfecting your home, visit Complete Disinfection Guidance. Take steps to improve ventilation at home Improve ventilation (air flow) at home to help prevent from spreading COVID-19 to other people in your household. Clear out COVID-19 virus particles in the air by opening windows, using air filters, and turning on fans in your home. Use this interactive tool to learn how to improve air flow in your home. When you can be around others after being sick with COVID-19 Deciding when you can be around others is different for different situations. Find out when you can safely end home isolation. For any additional questions about your care, contact your healthcare provider or state or local health department. 01/30/2021 Content source: [facility] ([facility]), Division of Viral Diseases This information is not intended to replace advice given to you by your health care provider. Make sure you discuss any questions you have with your health care provider. Document Revised: 03/15/2021 Document Reviewed: 03/15/2021 Elsevier Patient Education  2022 [employer].     If you have been instructed to have an in-person evaluation today at a local Urgent Care facility, please use the link below. It will take you to a list of all of our available [facility], including address, phone number and hours of operation.  Please do not delay care.  [facility]  If you or a family member do not have a primary care provider, use the link below to schedule a visit and establish care. When you choose a [facility] primary care physician or advanced practice provider, you gain a long-term partner in health. Find a Primary Care Provider  Learn more about [facility] in-office and virtual care options: [facility] - Get Care Now

## 2023-04-04 NOTE — Progress Notes (Signed)
Virtual Visit Consent   Danielle Pratt, you are scheduled for a virtual visit with a [facility] provider today. Just as with appointments in the office, your consent must be obtained to participate. Your consent will be active for this visit and any virtual visit you may have with one of our providers in the next 365 days. If you have a MyChart account, a copy of this consent can be sent to you electronically.  As this is a virtual visit, video technology does not allow for your provider to perform a traditional examination. This may limit your provider's ability to fully assess your condition. If your provider identifies any concerns that need to be evaluated in person or the need to arrange testing (such as labs, EKG, etc.), we will make arrangements to do so. Although advances in technology are sophisticated, we cannot ensure that it will always work on either your end or our end. If the connection with a video visit is poor, the visit may have to be switched to a telephone visit. With either a video or telephone visit, we are not always able to ensure that we have a secure connection.  By engaging in this virtual visit, you consent to the provision of healthcare and authorize for your insurance to be billed (if applicable) for the services provided during this visit. Depending on your insurance coverage, you may receive a charge related to this service.  I need to obtain your verbal consent now. Are you willing to proceed with your visit today? Crysta Desmidt has provided verbal consent on 04/04/2023 for a virtual visit (video or telephone). Margaretann Loveless, PA-C  Date: 04/04/2023 1:11 PM  Virtual Visit via Video Note   I, Margaretann Loveless, connected with  Danielle Pratt  ([id number], 71/08/1951) on 04/04/23 at  1:15 PM EDT by a video-enabled telemedicine application and verified that I am speaking with the correct person using two identifiers.  Location: Patient: Virtual  Visit Location Patient: Home Provider: Virtual Visit Location Provider: Home Office   I discussed the limitations of evaluation and management by telemedicine and the availability of in person appointments. The patient expressed understanding and agreed to proceed.    History of Present Illness: Danielle Pratt is a 72 y.o. who identifies as a female who was assigned female at birth, and is being seen today for Covid 17.  HPI: URI  This is a new problem. The current episode started yesterday (Tested positive for Covid 19 on at home test today; Symtpoms started last night). The problem has been gradually worsening. There has been no fever. Associated symptoms include congestion, coughing, headaches, rhinorrhea (and post nasal drainage) and a sore throat. Pertinent negatives include no diarrhea, ear pain, nausea, plugged ear sensation, sinus pain or vomiting. Associated symptoms comments: fatigue. She has tried nothing for the symptoms. The treatment provided no relief.     Problems:  Patient Active Problem List   Diagnosis Date Noted   History of DVT (deep vein thrombosis) and PE 12/18/2022   Osteoporosis 06/16/2022   Vitamin D deficiency 10/12/2020   Thrombophlebitis of superficial veins of left lower extremity 10/22/2019   Pain of both elbows 05/15/2018   PAH (pulmonary artery hypertension) (HCC) 03/02/2018   Oropharyngeal dysphagia 01/01/2018   Closed fracture of sesamoid bone of foot 06/18/2017   Postnasal drip 12/02/2016   Hip dislocation, right (HCC) 02/20/2016   DVT, RLE 01/31/16 02/20/2016   Anxiety 02/20/2016   Pulmonary emboli (HCC) 01/31/2016  Unruptured popliteal cyst 09/07/2013   Hyperlipidemia, mixed 01/01/2010   LEFT BUNDLE BRANCH BLOCK 01/01/2010   OSTEOARTHRITIS 01/01/2010   Malignant neoplasm of tonsillar fossa (HCC) 12/27/2008   Hypothyroidism 07/17/2007    Allergies:  Allergies  Allergen Reactions   Omnicef [Cefdinir]     Facial swelling   Medications:   Current Outpatient Medications:    molnupiravir EUA (LAGEVRIO) 200 mg CAPS capsule, Take 4 capsules (800 mg total) by mouth 2 (two) times daily for 5 days., Disp: 40 capsule, Rfl: 0   apixaban (ELIQUIS) 2.5 MG TABS tablet, Take 1 tablet (2.5 mg total) by mouth 2 (two) times daily., Disp: 180 tablet, Rfl: 3   Cholecalciferol (VITAMIN D3) 50 MCG (2000 UT) capsule, Take 1 capsule by mouth daily., Disp: , Rfl:    clobetasol cream (TEMOVATE) 0.05 %, Apply 1 application topically at bedtime as needed (for skin irritation/finger). , Disp: , Rfl:    Cyanocobalamin (VITAMIN B-12 PO), Take 2,500 mcg by mouth daily., Disp: , Rfl:    levothyroxine (SYNTHROID) 100 MCG tablet, TAKE ONE TABLET BY MOUTH DAILY, Disp: 90 tablet, Rfl: 3   METAMUCIL FIBER PO, Take 3-4 capsules by mouth daily as needed., Disp: , Rfl:    pilocarpine (SALAGEN) 5 MG tablet, Take 5 mg by mouth See admin instructions. Take 1 tablet (5 mg) by mouth 3 times daily - early morning, midday and bedtime, may take an additional tablet during the day as needed for dry mouth., Disp: , Rfl:    rosuvastatin (CRESTOR) 10 MG tablet, Take 1 tablet (10 mg total) by mouth daily., Disp: 90 tablet, Rfl: 3   tretinoin (RETIN-A) 0.05 % cream, Apply 1 application topically at bedtime., Disp: , Rfl:    venlafaxine XR (EFFEXOR-XR) 75 MG 24 hr capsule, TAKE ONE CAPSULE BY MOUTH EVERY NIGHT AT BEDTIME, Disp: 90 capsule, Rfl: 3  Observations/Objective: Patient is well-developed, well-nourished in no acute distress.  Resting comfortably at home.  Head is normocephalic, atraumatic.  No labored breathing.  Speech is clear and coherent with logical content.  Patient is alert and oriented at baseline.    Assessment and Plan: 1. COVID-19 - molnupiravir EUA (LAGEVRIO) 200 mg CAPS capsule; Take 4 capsules (800 mg total) by mouth 2 (two) times daily for 5 days.  Dispense: 40 capsule; Refill: 0 - MyChart COVID-19 home monitoring program; Future  - Continue OTC  symptomatic management of choice - Will send OTC vitamins and supplement information through AVS - Molnupiravir prescribed - Patient enrolled in MyChart symptom monitoring - Push fluids - Rest as needed - Discussed return precautions and when to seek in-person evaluation, sent via AVS as well   Follow Up Instructions: I discussed the assessment and treatment plan with the patient. The patient was provided an opportunity to ask questions and all were answered. The patient agreed with the plan and demonstrated an understanding of the instructions.  A copy of instructions were sent to the patient via MyChart unless otherwise noted below.    The patient was advised to call back or seek an in-person evaluation if the symptoms worsen or if the condition fails to improve as anticipated.  Time:  I spent 10 minutes with the patient via telehealth technology discussing the above problems/concerns.    Margaretann Loveless, PA-C

## 2023-04-16 ENCOUNTER — Encounter: Payer: Self-pay | Admitting: Nurse Practitioner

## 2023-04-16 ENCOUNTER — Telehealth: Payer: PPO | Admitting: Nurse Practitioner

## 2023-04-16 DIAGNOSIS — J069 Acute upper respiratory infection, unspecified: Secondary | ICD-10-CM | POA: Diagnosis not present

## 2023-04-16 NOTE — Progress Notes (Signed)
Virtual Visit Consent   Danielle Pratt, you are scheduled for a virtual visit with a [facility] provider today. Just as with appointments in the office, your consent must be obtained to participate. Your consent will be active for this visit and any virtual visit you may have with one of our providers in the next 365 days. If you have a MyChart account, a copy of this consent can be sent to you electronically.  As this is a virtual visit, video technology does not allow for your provider to perform a traditional examination. This may limit your provider's ability to fully assess your condition. If your provider identifies any concerns that need to be evaluated in person or the need to arrange testing (such as labs, EKG, etc.), we will make arrangements to do so. Although advances in technology are sophisticated, we cannot ensure that it will always work on either your end or our end. If the connection with a video visit is poor, the visit may have to be switched to a telephone visit. With either a video or telephone visit, we are not always able to ensure that we have a secure connection.  By engaging in this virtual visit, you consent to the provision of healthcare and authorize for your insurance to be billed (if applicable) for the services provided during this visit. Depending on your insurance coverage, you may receive a charge related to this service.  I need to obtain your verbal consent now. Are you willing to proceed with your visit today? Danielle Pratt has provided verbal consent on 04/16/2023 for a virtual visit (video or telephone). Danielle Simas, FNP  Date: 04/16/2023 3:20 PM  Virtual Visit via Video Note   I, Danielle Pratt, connected with  Danielle Pratt  ([id number], 1951/05/06) on 04/16/23 at  3:30 PM EDT by a video-enabled telemedicine application and verified that I am speaking with the correct person using two identifiers.  Location: Patient: Virtual Visit Location  Patient: Home Provider: Virtual Visit Location Provider: Home Office   I discussed the limitations of evaluation and management by telemedicine and the availability of in person appointments. The patient expressed understanding and agreed to proceed.    History of Present Illness: Danielle Pratt is a 72 y.o. who identifies as a female who was assigned female at birth, and is being seen today for recurrent COVID-19 symptoms.  Patient tested positive for COVID 04/04/23 and was started on Molnupiravir anti-viral at that time. She started feeling better on the anti-viral medication   She is now experiencing a mild headache, loss of taste, and a dry cough for the past week with some sinus congestion and post nasal drainage   She has not been taking anything OTC   She did take a COVID test today and it was negative   Denies fever or body aches   She has had one episode of COVID prior to this (2022)  Denies any complications from that episode of COVID   She has been vaccinated for COVID including recent booster     Denies a history of DM or HTN denies asthma or COPD   Problems:  Patient Active Problem List   Diagnosis Date Noted   History of DVT (deep vein thrombosis) and PE 12/18/2022   Osteoporosis 06/16/2022   Vitamin D deficiency 10/12/2020   Thrombophlebitis of superficial veins of left lower extremity 10/22/2019   Pain of both elbows 05/15/2018   PAH (pulmonary artery hypertension) (HCC) 03/02/2018  Oropharyngeal dysphagia 01/01/2018   Closed fracture of sesamoid bone of foot 06/18/2017   Postnasal drip 12/02/2016   Hip dislocation, right (HCC) 02/20/2016   DVT, RLE 01/31/16 02/20/2016   Anxiety 02/20/2016   Pulmonary emboli (HCC) 01/31/2016   Unruptured popliteal cyst 09/07/2013   Hyperlipidemia, mixed 01/01/2010   LEFT BUNDLE BRANCH BLOCK 01/01/2010   OSTEOARTHRITIS 01/01/2010   Malignant neoplasm of tonsillar fossa (HCC) 12/27/2008   Hypothyroidism 07/17/2007     Allergies:  Allergies  Allergen Reactions   Omnicef [Cefdinir]     Facial swelling   Medications:  Current Outpatient Medications:    apixaban (ELIQUIS) 2.5 MG TABS tablet, Take 1 tablet (2.5 mg total) by mouth 2 (two) times daily., Disp: 180 tablet, Rfl: 3   Cholecalciferol (VITAMIN D3) 50 MCG (2000 UT) capsule, Take 1 capsule by mouth daily., Disp: , Rfl:    clobetasol cream (TEMOVATE) 0.05 %, Apply 1 application topically at bedtime as needed (for skin irritation/finger). , Disp: , Rfl:    Cyanocobalamin (VITAMIN B-12 PO), Take 2,500 mcg by mouth daily., Disp: , Rfl:    levothyroxine (SYNTHROID) 100 MCG tablet, TAKE ONE TABLET BY MOUTH DAILY, Disp: 90 tablet, Rfl: 3   METAMUCIL FIBER PO, Take 3-4 capsules by mouth daily as needed., Disp: , Rfl:    pilocarpine (SALAGEN) 5 MG tablet, Take 5 mg by mouth See admin instructions. Take 1 tablet (5 mg) by mouth 3 times daily - early morning, midday and bedtime, may take an additional tablet during the day as needed for dry mouth., Disp: , Rfl:    rosuvastatin (CRESTOR) 10 MG tablet, Take 1 tablet (10 mg total) by mouth daily., Disp: 90 tablet, Rfl: 3   tretinoin (RETIN-A) 0.05 % cream, Apply 1 application topically at bedtime., Disp: , Rfl:    venlafaxine XR (EFFEXOR-XR) 75 MG 24 hr capsule, TAKE ONE CAPSULE BY MOUTH EVERY NIGHT AT BEDTIME, Disp: 90 capsule, Rfl: 3  Observations/Objective: Patient is well-developed, well-nourished in no acute distress.  Resting comfortably  at home.  Head is normocephalic, atraumatic.  No labored breathing.  Speech is clear and coherent with logical content.  Patient is alert and oriented at baseline.    Assessment and Plan: 1. Viral upper respiratory tract infection Discussed possible rebound COVID, new URI or ongoing recovery.   Advised on Flonase, Tylenol and increasing protein in diet  Assure hydration  Vitamin D3 and C for immune support   Follow up if symptoms persist or with new concerns or  worsening symptoms       Follow Up Instructions: I discussed the assessment and treatment plan with the patient. The patient was provided an opportunity to ask questions and all were answered. The patient agreed with the plan and demonstrated an understanding of the instructions.  A copy of instructions were sent to the patient via MyChart unless otherwise noted below.    The patient was advised to call back or seek an in-person evaluation if the symptoms worsen or if the condition fails to improve as anticipated.  Time:  I spent 15 minutes with the patient via telehealth technology discussing the above problems/concerns.    Danielle Simas, FNP

## 2023-05-05 ENCOUNTER — Ambulatory Visit (INDEPENDENT_AMBULATORY_CARE_PROVIDER_SITE_OTHER): Payer: PPO

## 2023-05-05 VITALS — Ht 69.0 in | Wt 152.0 lb

## 2023-05-05 DIAGNOSIS — Z0000 Encounter for general adult medical examination without abnormal findings: Secondary | ICD-10-CM

## 2023-05-05 NOTE — Patient Instructions (Addendum)
Danielle Pratt , Thank you for taking time to come for your Medicare Wellness Visit. I appreciate your ongoing commitment to your health goals. Please review the following plan we discussed and let me know if I can assist you in the future.   These are the goals we discussed:  Goals      My healthcare goals for 2024 is to stay healthy, drink more water and start exercising.        This is a list of the screening recommended for you and due dates:  Health Maintenance  Topic Date Due   COVID-19 Vaccine (6 - 2023-24 season) 11/22/2022   Flu Shot  06/12/2023   Mammogram  06/14/2023   Medicare Annual Wellness Visit  05/04/2024   DEXA scan (bone density measurement)  06/13/2024   Colon Cancer Screening  01/10/2026   DTaP/Tdap/Td vaccine (4 - Td or Tdap) 11/11/2028   Pneumonia Vaccine  Completed   Hepatitis C Screening  Completed   Zoster (Shingles) Vaccine  Completed   HPV Vaccine  Aged Out    Advanced directives: Yes  Conditions/risks identified: Yes  Next appointment: It was nice speaking with you today!  Please follow up in one year for your annual wellness visit via telephone call with Nurse Percell Miller on 05/10/2024 at 2:30 p.m.  If you need to cancel or reschedule please call [phone].   Preventive Care 71 Years and Older, Female Preventive care refers to lifestyle choices and visits with your health care provider that can promote health and wellness. What does preventive care include? A yearly physical exam. This is also called an annual well check. Dental exams once or twice a year. Routine eye exams. Ask your health care provider how often you should have your eyes checked. Personal lifestyle choices, including: Daily care of your teeth and gums. Regular physical activity. Eating a healthy diet. Avoiding tobacco and drug use. Limiting alcohol use. Practicing safe sex. Taking low-dose aspirin every day. Taking vitamin and mineral supplements as recommended by your health  care provider. What happens during an annual well check? The services and screenings done by your health care provider during your annual well check will depend on your age, overall health, lifestyle risk factors, and family history of disease. Counseling  Your health care provider may ask you questions about your: Alcohol use. Tobacco use. Drug use. Emotional well-being. Home and relationship well-being. Sexual activity. Eating habits. History of falls. Memory and ability to understand (cognition). Work and work Astronomer. Reproductive health. Screening  You may have the following tests or measurements: Height, weight, and BMI. Blood pressure. Lipid and cholesterol levels. These may be checked every 5 years, or more frequently if you are over 74 years old. Skin check. Lung cancer screening. You may have this screening every year starting at age 65 if you have a 30-pack-year history of smoking and currently smoke or have quit within the past 15 years. Fecal occult blood test (FOBT) of the stool. You may have this test every year starting at age 41. Flexible sigmoidoscopy or colonoscopy. You may have a sigmoidoscopy every 5 years or a colonoscopy every 10 years starting at age 43. Hepatitis C blood test. Hepatitis B blood test. Sexually transmitted disease (STD) testing. Diabetes screening. This is done by checking your blood sugar (glucose) after you have not eaten for a while (fasting). You may have this done every 1-3 years. Bone density scan. This is done to screen for osteoporosis. You may have this done  starting at age 67. Mammogram. This may be done every 1-2 years. Talk to your health care provider about how often you should have regular mammograms. Talk with your health care provider about your test results, treatment options, and if necessary, the need for more tests. Vaccines  Your health care provider may recommend certain vaccines, such as: Influenza vaccine. This is  recommended every year. Tetanus, diphtheria, and acellular pertussis (Tdap, Td) vaccine. You may need a Td booster every 10 years. Zoster vaccine. You may need this after age 44. Pneumococcal 13-valent conjugate (PCV13) vaccine. One dose is recommended after age 57. Pneumococcal polysaccharide (PPSV23) vaccine. One dose is recommended after age 47. Talk to your health care provider about which screenings and vaccines you need and how often you need them. This information is not intended to replace advice given to you by your health care provider. Make sure you discuss any questions you have with your health care provider. Document Released: 11/24/2015 Document Revised: 07/17/2016 Document Reviewed: 08/29/2015 Elsevier Interactive Patient Education  2017 [employer].  Fall Prevention in the Home Falls can cause injuries. They can happen to people of all ages. There are many things you can do to make your home safe and to help prevent falls. What can I do on the outside of my home? Regularly fix the edges of walkways and driveways and fix any cracks. Remove anything that might make you trip as you walk through a door, such as a raised step or threshold. Trim any bushes or trees on the path to your home. Use bright outdoor lighting. Clear any walking paths of anything that might make someone trip, such as rocks or tools. Regularly check to see if handrails are loose or broken. Make sure that both sides of any steps have handrails. Any raised decks and porches should have guardrails on the edges. Have any leaves, snow, or ice cleared regularly. Use sand or salt on walking paths during winter. Clean up any spills in your garage right away. This includes oil or grease spills. What can I do in the bathroom? Use night lights. Install grab bars by the toilet and in the tub and shower. Do not use towel bars as grab bars. Use non-skid mats or decals in the tub or shower. If you need to sit down in  the shower, use a plastic, non-slip stool. Keep the floor dry. Clean up any water that spills on the floor as soon as it happens. Remove soap buildup in the tub or shower regularly. Attach bath mats securely with double-sided non-slip rug tape. Do not have throw rugs and other things on the floor that can make you trip. What can I do in the bedroom? Use night lights. Make sure that you have a light by your bed that is easy to reach. Do not use any sheets or blankets that are too big for your bed. They should not hang down onto the floor. Have a firm chair that has side arms. You can use this for support while you get dressed. Do not have throw rugs and other things on the floor that can make you trip. What can I do in the kitchen? Clean up any spills right away. Avoid walking on wet floors. Keep items that you use a lot in easy-to-reach places. If you need to reach something above you, use a strong step stool that has a grab bar. Keep electrical cords out of the way. Do not use floor polish or wax that  makes floors slippery. If you must use wax, use non-skid floor wax. Do not have throw rugs and other things on the floor that can make you trip. What can I do with my stairs? Do not leave any items on the stairs. Make sure that there are handrails on both sides of the stairs and use them. Fix handrails that are broken or loose. Make sure that handrails are as long as the stairways. Check any carpeting to make sure that it is firmly attached to the stairs. Fix any carpet that is loose or worn. Avoid having throw rugs at the top or bottom of the stairs. If you do have throw rugs, attach them to the floor with carpet tape. Make sure that you have a light switch at the top of the stairs and the bottom of the stairs. If you do not have them, ask someone to add them for you. What else can I do to help prevent falls? Wear shoes that: Do not have high heels. Have rubber bottoms. Are comfortable  and fit you well. Are closed at the toe. Do not wear sandals. If you use a stepladder: Make sure that it is fully opened. Do not climb a closed stepladder. Make sure that both sides of the stepladder are locked into place. Ask someone to hold it for you, if possible. Clearly mark and make sure that you can see: Any grab bars or handrails. First and last steps. Where the edge of each step is. Use tools that help you move around (mobility aids) if they are needed. These include: Canes. Walkers. Scooters. Crutches. Turn on the lights when you go into a dark area. Replace any light bulbs as soon as they burn out. Set up your furniture so you have a clear path. Avoid moving your furniture around. If any of your floors are uneven, fix them. If there are any pets around you, be aware of where they are. Review your medicines with your doctor. Some medicines can make you feel dizzy. This can increase your chance of falling. Ask your doctor what other things that you can do to help prevent falls. This information is not intended to replace advice given to you by your health care provider. Make sure you discuss any questions you have with your health care provider. Document Released: 08/24/2009 Document Revised: 04/04/2016 Document Reviewed: 12/02/2014 Elsevier Interactive Patient Education  2017 [employer].

## 2023-05-05 NOTE — Progress Notes (Signed)
Subjective:   Danielle Pratt is a 72 y.o. female who presents for Medicare Annual (Subsequent) preventive examination.  Visit Complete: Virtual  I connected with  Jamal Maes on 05/05/23 by a audio enabled telemedicine application and verified that I am speaking with the correct person using two identifiers.  Patient Location: Home  Provider Location: Office/Clinic  I discussed the limitations of evaluation and management by telemedicine. The patient expressed understanding and agreed to proceed.   Review of Systems     Cardiac Risk Factors include: advanced age (>65men, >26 women);family history of premature cardiovascular disease;dyslipidemia;sedentary lifestyle     Objective:    Today's Vitals   05/05/23 1401  Weight: 152 lb (68.9 kg)  Height: 5\' 9"  (1.753 m)  PainSc: 0-No pain   Body mass index is 22.45 kg/m.     05/05/2023    2:03 PM 04/26/2022    8:59 AM 02/28/2022    3:22 PM 11/16/2018    2:42 PM 10/22/2018    9:08 AM 03/03/2018    1:00 AM 03/02/2018    6:05 AM  Advanced Directives  Does Patient Have a Medical Advance Directive? Yes Yes No Yes Yes Yes Yes  Type of Estate agent of [place];Living will Healthcare Power of [place];Living will  Healthcare Power of [employer] of [place];Living will Healthcare Power of [employer] of [place];Living will  Does patient want to make changes to medical advance directive?    No - Patient declined  No - Patient declined   Copy of Healthcare Power of Attorney in Chart? No - copy requested No - copy requested  No - copy requested  No - copy requested No - copy requested  Would patient like information on creating a medical advance directive?   No - Patient declined        Current Medications (verified) Outpatient Encounter Medications as of 05/05/2023  Medication Sig   apixaban (ELIQUIS) 2.5 MG TABS tablet Take 1 tablet (2.5 mg total) by mouth 2 (two) times  daily.   Cholecalciferol (VITAMIN D3) 50 MCG (2000 UT) capsule Take 1 capsule by mouth daily.   clobetasol cream (TEMOVATE) 0.05 % Apply 1 application topically at bedtime as needed (for skin irritation/finger).    Cyanocobalamin (VITAMIN B-12 PO) Take 2,500 mcg by mouth daily.   levothyroxine (SYNTHROID) 100 MCG tablet TAKE ONE TABLET BY MOUTH DAILY   METAMUCIL FIBER PO Take 3-4 capsules by mouth daily as needed.   pilocarpine (SALAGEN) 5 MG tablet Take 5 mg by mouth See admin instructions. Take 1 tablet (5 mg) by mouth 3 times daily - early morning, midday and bedtime, may take an additional tablet during the day as needed for dry mouth.   rosuvastatin (CRESTOR) 10 MG tablet Take 1 tablet (10 mg total) by mouth daily.   tretinoin (RETIN-A) 0.05 % cream Apply 1 application topically at bedtime.   venlafaxine XR (EFFEXOR-XR) 75 MG 24 hr capsule TAKE ONE CAPSULE BY MOUTH EVERY NIGHT AT BEDTIME   No facility-administered encounter medications on file as of 05/05/2023.    Allergies (verified) Omnicef [cefdinir]   History: Past Medical History:  Diagnosis Date   Anxiety    Arthritis    "hands" (11/12/2017)   Cataract    extraction surgery   DVT (deep venous thrombosis) (HCC) 01/2016   LLE   Fasting hyperglycemia    110 in 01/2011;131 in 05/2007   Hypothyroidism    Popliteal cyst    Pulmonary embolism (  HCC) 2017; 11/11/2017   "the one in 2017 was due to LLE DVT; don't why I had the one 11/11/2017"   Renal insufficiency    Tonsillar cancer (HCC) 1998   Past Surgical History:  Procedure Laterality Date   CESAREAN SECTION  1984   G 1 P 2   COLONOSCOPY  2003   negative; Dr Marina Goodell   LYMPHADENECTOMY Right 1998   neck   PULMONARY EMBOLISM SURGERY     RIGHT HEART CATH N/A 10/22/2018   Procedure: RIGHT HEART CATH;  Surgeon: Dolores Patty, MD;  Location: [facility];  Service: Cardiovascular;  Laterality: N/A;   RIGHT/LEFT HEART CATH AND CORONARY ANGIOGRAPHY N/A 03/02/2018    Procedure: RIGHT/LEFT HEART CATH AND CORONARY ANGIOGRAPHY;  Surgeon: Dolores Patty, MD;  Location: [facility];  Service: Cardiovascular;  Laterality: N/A;   TEMPORARY PACEMAKER N/A 03/02/2018   Procedure: TEMPORARY PACEMAKER;  Surgeon: Dolores Patty, MD;  Location: [facility];  Service: Cardiovascular;  Laterality: N/A;   TONSILLECTOMY AND ADENOIDECTOMY Right 1998   R tonsilar malignancy; subsequent LN dissection negative   TOTAL ABDOMINAL HYSTERECTOMY  1980s   TOTAL ABDOMINAL HYSTERECTOMY W/ BILATERAL SALPINGOOPHORECTOMY [SHX83]; fibroids; Dr. Jennette Kettle   TOTAL HIP ARTHROPLASTY Bilateral 2007   Dr Despina Hick, both hips 3 months apart   Family History  Problem Relation Age of Onset   Hyperlipidemia Mother    Hypertension Mother    Ovarian cancer Mother    Aneurysm Mother        ascending & descending aorta   Heart attack Father 104   Leukemia Paternal Grandmother    Lung cancer Paternal Grandfather    Diabetes Neg Hx    Stroke Neg Hx    Colon cancer Neg Hx    Stomach cancer Neg Hx    Rectal cancer Neg Hx    Breast cancer Neg Hx    Social History   Socioeconomic History   Marital status: Married    Spouse name: Not on file   Number of children: Not on file   Years of education: Not on file   Highest education level: Not on file  Occupational History   Not on file  Tobacco Use   Smoking status: Never   Smokeless tobacco: Never  Vaping Use   Vaping Use: Never used  Substance and Sexual Activity   Alcohol use: Yes    Alcohol/week: 3.0 standard drinks of alcohol    Types: 3 Cans of beer per week   Drug use: No   Sexual activity: Yes  Other Topics Concern   Not on file  Social History Narrative   Not on file   Social Determinants of Health   Financial Resource Strain: Low Risk  (05/05/2023)   Overall Financial Resource Strain (CARDIA)    Difficulty of Paying Living Expenses: Not hard at all  Food Insecurity: No Food Insecurity (05/05/2023)   Hunger  Vital Sign    Worried About Running Out of Food in the Last Year: Never true    Ran Out of Food in the Last Year: Never true  Transportation Needs: No Transportation Needs (05/05/2023)   PRAPARE - Administrator, Civil Service (Medical): No    Lack of Transportation (Non-Medical): No  Physical Activity: Inactive (05/05/2023)   Exercise Vital Sign    Days of Exercise per Week: 0 days    Minutes of Exercise per Session: 0 min  Stress: No Stress Concern Present (05/05/2023)  [employer] of Occupational Health - Occupational Stress Questionnaire    Feeling of Stress : Not at all  Social Connections: Moderately Integrated (05/05/2023)   Social Connection and Isolation Panel [NHANES]    Frequency of Communication with Friends and Family: Three times a week    Frequency of Social Gatherings with Friends and Family: Three times a week    Attends Religious Services: 1 to 4 times per year    Active Member of Clubs or Organizations: No    Attends Banker Meetings: Never    Marital Status: Married    Tobacco Counseling Counseling given: Not Answered   Clinical Intake:  Pre-visit preparation completed: Yes  Pain : No/denies pain Pain Score: 0-No pain     BMI - recorded: 22.45 Nutritional Status: BMI of 19-24  Normal Nutritional Risks: None Diabetes: No  How often do you need to have someone help you when you read instructions, pamphlets, or other written materials from your doctor or pharmacy?: 1 - Never What is the last grade level you completed in school?: 2 years of college  Interpreter Needed?: No  Information entered by :: Trenell Moxey N. Jamarian Jacinto, LPN.   Activities of Daily Living    05/05/2023    2:06 PM  In your present state of health, do you have any difficulty performing the following activities:  Hearing? 0  Vision? 0  Difficulty concentrating or making decisions? 0  Walking or climbing stairs? 0  Dressing or bathing? 0  Doing errands,  shopping? 0  Preparing Food and eating ? N  Using the Toilet? N  In the past six months, have you accidently leaked urine? N  Comment coughing & sneezing  Do you have problems with loss of bowel control? N  Managing your Medications? N  Managing your Finances? N  Housekeeping or managing your Housekeeping? N    Patient Care Team: Pincus Sanes, MD as PCP - General (Internal Medicine) Runell Gess, MD as PCP - Cardiology (Cardiology) Szabat, Vinnie Level, [facility] (Inactive) (Pharmacist)  Indicate any recent Medical Services you may have received from other than [facility] providers in the past year (date may be approximate).     Assessment:   This is a routine wellness examination for [facility].  Hearing/Vision screen Hearing Screening - Comments:: Denies hearing difficulties   Vision Screening - Comments:: Wears rx glasses - up to date with routine eye exams with [facility]   Dietary issues and exercise activities discussed:     Goals Addressed             This Visit's Progress    My healthcare goals for 2024 is to stay healthy, drink more water and start exercising.        Depression Screen    05/05/2023    2:05 PM 12/18/2022    8:04 AM 04/26/2022    8:59 AM 04/26/2022    8:57 AM 10/15/2021   12:47 PM 01/26/2020    1:35 PM 11/16/2018    2:35 PM  PHQ 2/9 Scores  PHQ - 2 Score 0 0 0 0 1 0 2  PHQ- 9 Score 0 1   3  4     Fall Risk    05/05/2023    2:04 PM 12/18/2022    8:04 AM 04/26/2022    8:59 AM 10/15/2021    9:23 AM 01/26/2020    1:35 PM  Fall Risk   Falls in the past year? 0 0 0 0 0  Number falls in past yr: 0 0 0 0 0  Injury with Fall? 0 0 0 0 0  Risk for fall due to : No Fall Risks No Fall Risks  No Fall Risks   Follow up Falls prevention discussed Falls evaluation completed Falls evaluation completed;Education provided Falls evaluation completed     MEDICARE RISK AT HOME:  Medicare Risk at Home - 05/05/23 1403     Any stairs in or around the home? Yes    If so,  are there any without handrails? No    Home free of loose throw rugs in walkways, pet beds, electrical cords, etc? Yes    Adequate lighting in your home to reduce risk of falls? Yes    Life alert? No    Use of a cane, walker or w/c? No    Grab bars in the bathroom? No    Shower chair or bench in shower? Yes   in the basement   Elevated toilet seat or a handicapped toilet? No             TIMED UP AND GO:  Was the test performed?  No    Cognitive Function:        05/05/2023    2:11 PM  6CIT Screen  What Year? 0 points  What month? 0 points  What time? 0 points  Count back from 20 0 points  Months in reverse 0 points  Repeat phrase 0 points  Total Score 0 points    Immunizations Immunization History  Administered Date(s) Administered   COVID-19, mRNA, vaccine(Comirnaty)12 years and older 09/27/2022   Fluad Quad(high Dose 65+) 08/13/2022   Influenza, High Dose Seasonal PF 09/03/2018, 08/17/2019, 08/25/2020   Influenza, Quadrivalent, Recombinant, Inj, Pf 09/10/2017   Influenza,inj,Quad PF,6+ Mos 09/05/2014, 07/28/2015, 09/05/2016   Influenza-Unspecified 09/05/2014, 07/28/2015, 09/05/2016, 08/11/2017, 09/05/2021   Moderna Covid-19 Vaccine Bivalent Booster 58yrs & up 11/20/2021   Moderna SARS-COV2 Booster Vaccination 06/13/2021   PFIZER(Purple Top)SARS-COV-2 Vaccination 12/02/2019, 12/23/2019, 11/16/2020   Pneumococcal Conjugate-13 01/26/2020   Pneumococcal Polysaccharide-23 10/15/2021   Respiratory Syncytial Virus Vaccine,Recomb Aduvanted(Arexvy) 08/13/2022   Tdap 11/11/2010, 10/12/2017, 11/11/2018   Zoster Recombinat (Shingrix) 07/05/2021, 09/15/2021    TDAP status: Up to date  Flu Vaccine status: Up to date  Pneumococcal vaccine status: Up to date  Covid-19 vaccine status: Completed vaccines  Qualifies for Shingles Vaccine? Yes   Zostavax completed No   Shingrix Completed?: Yes  Screening Tests Health Maintenance  Topic Date Due   COVID-19 Vaccine (6 -  2023-24 season) 11/22/2022   INFLUENZA VACCINE  06/12/2023   MAMMOGRAM  06/14/2023   Medicare Annual Wellness (AWV)  05/04/2024   DEXA SCAN  06/13/2024   Colonoscopy  01/10/2026   DTaP/Tdap/Td (4 - Td or Tdap) 11/11/2028   Pneumonia Vaccine 66+ Years old  Completed   Hepatitis C Screening  Completed   Zoster Vaccines- Shingrix  Completed   HPV VACCINES  Aged Out    Health Maintenance  Health Maintenance Due  Topic Date Due   COVID-19 Vaccine (6 - 2023-24 season) 11/22/2022    Colorectal cancer screening: Type of screening: Colonoscopy. Completed 01/10/2021. Repeat every 5 years  Mammogram status: Completed 06/13/2022. Repeat every year  Bone Density status: Completed 06/13/2022. Results reflect: Bone density results: OSTEOPOROSIS. Repeat every 2 years.  Lung Cancer Screening: (Low Dose CT Chest recommended if Age 83-80 years, 20 pack-year currently smoking OR have quit w/in 15years.) does not qualify.   Lung Cancer Screening Referral: NO  Additional Screening:  Hepatitis C Screening: does qualify; Completed 12/02/2016  Vision Screening: Recommended annual ophthalmology exams for early detection of glaucoma and other disorders of the eye. Is the patient up to date with their annual eye exam?  Yes  Who is the provider or what is the name of the office in which the patient attends annual eye exams?  [facility] If pt is not established with a provider, would they like to be referred to a provider to establish care? No .   Dental Screening: Recommended annual dental exams for proper oral hygiene  Diabetic Foot Exam: N/A  Community Resource Referral / Chronic Care Management: CRR required this visit?  No   CCM required this visit?  No     Plan:     I have personally reviewed and noted the following in the patient's chart:   Medical and social history Use of alcohol, tobacco or illicit drugs  Current medications and supplements including opioid prescriptions. Patient is  not currently taking opioid prescriptions. Functional ability and status Nutritional status Physical activity Advanced directives List of other physicians Hospitalizations, surgeries, and ER visits in previous 12 months Vitals Screenings to include cognitive, depression, and falls Referrals and appointments  In addition, I have reviewed and discussed with patient certain preventive protocols, quality metrics, and best practice recommendations. A written personalized care plan for preventive services as well as general preventive health recommendations were provided to patient.     Mickeal Needy, LPN   7/84/6962   After Visit Summary: (Mail) Due to this being a telephonic visit, the after visit summary with patients personalized plan was offered to patient via mail   Nurse Notes: Normal cognitive status assessed by direct observation via telephone conversation by this Nurse Health Advisor. No abnormalities found.

## 2023-05-12 DIAGNOSIS — M25562 Pain in left knee: Secondary | ICD-10-CM | POA: Diagnosis not present

## 2023-05-26 DIAGNOSIS — M25562 Pain in left knee: Secondary | ICD-10-CM | POA: Diagnosis not present

## 2023-06-05 DIAGNOSIS — L821 Other seborrheic keratosis: Secondary | ICD-10-CM | POA: Diagnosis not present

## 2023-06-05 DIAGNOSIS — L820 Inflamed seborrheic keratosis: Secondary | ICD-10-CM | POA: Diagnosis not present

## 2023-07-04 ENCOUNTER — Other Ambulatory Visit: Payer: Self-pay | Admitting: Internal Medicine

## 2023-07-04 DIAGNOSIS — Z1231 Encounter for screening mammogram for malignant neoplasm of breast: Secondary | ICD-10-CM

## 2023-07-04 DIAGNOSIS — E785 Hyperlipidemia, unspecified: Secondary | ICD-10-CM | POA: Diagnosis not present

## 2023-07-15 ENCOUNTER — Ambulatory Visit
Admission: RE | Admit: 2023-07-15 | Discharge: 2023-07-15 | Disposition: A | Discharge status: Home / Self Care | Payer: PPO | Source: Ambulatory Visit | Attending: Internal Medicine | Admitting: Internal Medicine

## 2023-07-15 DIAGNOSIS — Z1231 Encounter for screening mammogram for malignant neoplasm of breast: Secondary | ICD-10-CM

## 2023-09-10 DIAGNOSIS — H43813 Vitreous degeneration, bilateral: Secondary | ICD-10-CM | POA: Diagnosis not present

## 2023-12-15 ENCOUNTER — Other Ambulatory Visit: Payer: Self-pay | Admitting: Internal Medicine

## 2023-12-21 ENCOUNTER — Encounter: Payer: Self-pay | Admitting: Internal Medicine

## 2023-12-21 NOTE — Progress Notes (Signed)
 Subjective:    Patient ID: Danielle Pratt, female    DOB: 1951/06/21, 73 y.o.   MRN: [id number]      HPI Danielle Pratt is here for a Physical exam and her chronic medical problems.      Medications and allergies reviewed with patient and updated if appropriate.  Current Outpatient Medications on File Prior to Visit  Medication Sig Dispense Refill  . Cholecalciferol  (VITAMIN D3) 50 MCG (2000 UT) capsule Take 1 capsule by mouth daily.    . clobetasol  cream (TEMOVATE ) 0.05 % Apply 1 application topically at bedtime as needed (for skin irritation/finger).     . Cyanocobalamin (VITAMIN B-12 PO) Take 2,500 mcg by mouth daily.    Aaron Aas METAMUCIL FIBER PO Take 3-4 capsules by mouth daily as needed.    . pilocarpine  (SALAGEN ) 5 MG tablet Take 5 mg by mouth See admin instructions. Take 1 tablet (5 mg) by mouth 3 times daily - early morning, midday and bedtime, may take an additional tablet during the day as needed for dry mouth.    . tretinoin (RETIN-A) 0.05 % cream Apply 1 application topically at bedtime.    . venlafaxine  XR (EFFEXOR -XR) 75 MG 24 hr capsule TAKE 1 CAPSULE BY MOUTH EVERY NIGHT AT BEDTIME 90 capsule 3   No current facility-administered medications on file prior to visit.    Review of Systems     Objective:  There were no vitals filed for this visit. There were no vitals filed for this visit. There is no height or weight on file to calculate BMI.  BP Readings from Last 3 Encounters:  03/18/24 132/78  03/08/24 128/82  01/05/24 118/78    Wt Readings from Last 3 Encounters:  03/18/24 148 lb (67.1 kg)  03/08/24 150 lb (68 kg)  01/05/24 148 lb (67.1 kg)       Physical Exam Constitutional: She appears well-developed and well-nourished. No distress.  HENT:  Head: Normocephalic and atraumatic.  Right Ear: External ear normal. Normal ear canal and TM Left Ear: External ear normal.  Normal ear canal and TM Mouth/Throat: Oropharynx is clear and moist.  Eyes:  Conjunctivae normal.  Neck: Neck supple. No tracheal deviation present. No thyromegaly present.  No carotid bruit  Cardiovascular: Normal rate, regular rhythm and normal heart sounds.   No murmur heard.  No edema. Pulmonary/Chest: Effort normal and breath sounds normal. No respiratory distress. She has no wheezes. She has no rales.  Breast: deferred   Abdominal: Soft. She exhibits no distension. There is no tenderness.  Lymphadenopathy: She has no cervical adenopathy.  Skin: Skin is warm and dry. She is not diaphoretic.  Psychiatric: She has a normal mood and affect. Her behavior is normal.     Lab Results  Component Value Date   WBC 5.0 01/05/2024   HGB 14.0 01/05/2024   HCT 41.5 01/05/2024   PLT 201.0 01/05/2024   GLUCOSE 91 01/05/2024   CHOL 153 01/05/2024   TRIG 52.0 01/05/2024   HDL 72.90 01/05/2024   LDLDIRECT 161.1 06/09/2013   LDLCALC 70 01/05/2024   ALT 16 01/05/2024   AST 20 01/05/2024   NA 139 01/05/2024   K 4.8 01/05/2024   CL 100 01/05/2024   CREATININE 0.84 01/05/2024   BUN 19 01/05/2024   CO2 30 01/05/2024   TSH 0.99 01/05/2024   INR 1.15 02/25/2018   HGBA1C 5.6 02/01/2016         Assessment & Plan:   Physical exam: Screening blood work  ordered Exercise   Weight   Substance abuse  none   Reviewed recommended immunizations.   Health Maintenance  Topic Date Due  . Medicare Annual Wellness (AWV)  05/04/2024  . COVID-19 Vaccine (7 - 2024-25 season) 04/03/2024 (Originally 11/05/2023)  . INFLUENZA VACCINE  06/11/2024  . DEXA SCAN  06/13/2024  . MAMMOGRAM  07/14/2024  . Colonoscopy  01/10/2026  . DTaP/Tdap/Td (4 - Td or Tdap) 11/11/2028  . Pneumonia Vaccine 74+ Years old  Completed  . Hepatitis C Screening  Completed  . Zoster Vaccines- Shingrix  Completed  . HPV VACCINES  Aged Out  . Meningococcal B Vaccine  Aged Out          See Problem List for Assessment and Plan of chronic medical problems.     This encounter was created in  error - please disregard.

## 2023-12-21 NOTE — Patient Instructions (Addendum)

## 2023-12-22 ENCOUNTER — Encounter: Payer: PPO | Admitting: Internal Medicine

## 2023-12-22 DIAGNOSIS — E034 Atrophy of thyroid (acquired): Secondary | ICD-10-CM

## 2023-12-22 DIAGNOSIS — E559 Vitamin D deficiency, unspecified: Secondary | ICD-10-CM

## 2023-12-22 DIAGNOSIS — E782 Mixed hyperlipidemia: Secondary | ICD-10-CM

## 2023-12-22 DIAGNOSIS — M810 Age-related osteoporosis without current pathological fracture: Secondary | ICD-10-CM

## 2023-12-22 DIAGNOSIS — Z86718 Personal history of other venous thrombosis and embolism: Secondary | ICD-10-CM

## 2023-12-22 DIAGNOSIS — F419 Anxiety disorder, unspecified: Secondary | ICD-10-CM

## 2023-12-22 NOTE — Assessment & Plan Note (Signed)
Chronic Regular exercise and healthy diet encouraged Check lipid panel, cmp, tsh Continue Crestor 10 mg daily

## 2023-12-22 NOTE — Assessment & Plan Note (Signed)
Chronic Controlled, stable Continue effexor 75 mg daily  

## 2023-12-22 NOTE — Assessment & Plan Note (Signed)
 Chronic DEXA up to date Does not tolerate supplemental calcium -encouraged high calcium  diet Taking vitamin D  daily Stressed regular exercise DEXA due later this year

## 2023-12-22 NOTE — Assessment & Plan Note (Signed)
 Chronic DVT 01/2016-Xarelto  x 6 months PE 11/2017 Needs lifelong anticoagulation Continue Eliquis  dose to 2.5 mg twice daily CBC, CMP

## 2023-12-22 NOTE — Assessment & Plan Note (Signed)
Chronic  Clinically euthyroid Check tsh and will titrate med dose if needed Currently taking levothyroxine 100 mcg daily

## 2023-12-22 NOTE — Assessment & Plan Note (Signed)
 Chronic Taking vitamin D daily Check vitamin D level

## 2023-12-23 DIAGNOSIS — H25041 Posterior subcapsular polar age-related cataract, right eye: Secondary | ICD-10-CM | POA: Diagnosis not present

## 2023-12-23 DIAGNOSIS — H2511 Age-related nuclear cataract, right eye: Secondary | ICD-10-CM | POA: Diagnosis not present

## 2023-12-23 DIAGNOSIS — Z961 Presence of intraocular lens: Secondary | ICD-10-CM | POA: Diagnosis not present

## 2023-12-23 DIAGNOSIS — H18413 Arcus senilis, bilateral: Secondary | ICD-10-CM | POA: Diagnosis not present

## 2024-01-05 ENCOUNTER — Encounter: Payer: Self-pay | Admitting: Internal Medicine

## 2024-01-05 ENCOUNTER — Ambulatory Visit (INDEPENDENT_AMBULATORY_CARE_PROVIDER_SITE_OTHER): Payer: PPO | Admitting: Internal Medicine

## 2024-01-05 VITALS — BP 118/78 | HR 65 | Temp 98.0°F | Ht 69.0 in | Wt 148.0 lb

## 2024-01-05 DIAGNOSIS — R350 Frequency of micturition: Secondary | ICD-10-CM

## 2024-01-05 DIAGNOSIS — E034 Atrophy of thyroid (acquired): Secondary | ICD-10-CM | POA: Diagnosis not present

## 2024-01-05 DIAGNOSIS — E559 Vitamin D deficiency, unspecified: Secondary | ICD-10-CM

## 2024-01-05 DIAGNOSIS — F419 Anxiety disorder, unspecified: Secondary | ICD-10-CM

## 2024-01-05 DIAGNOSIS — Z0000 Encounter for general adult medical examination without abnormal findings: Secondary | ICD-10-CM

## 2024-01-05 DIAGNOSIS — E782 Mixed hyperlipidemia: Secondary | ICD-10-CM | POA: Diagnosis not present

## 2024-01-05 DIAGNOSIS — M810 Age-related osteoporosis without current pathological fracture: Secondary | ICD-10-CM

## 2024-01-05 LAB — LIPID PANEL
Cholesterol: 153 mg/dL (ref 0–200)
HDL: 72.9 mg/dL (ref >39.00)
LDL Cholesterol: 70 mg/dL (ref 0–99)
NonHDL: 80.44
Total CHOL/HDL Ratio: 2
Triglycerides: 52 mg/dL (ref 0.0–149.0)
VLDL: 10.4 mg/dL (ref 0.0–40.0)

## 2024-01-05 LAB — COMPREHENSIVE METABOLIC PANEL
ALT: 16 U/L (ref 0–35)
AST: 20 U/L (ref 0–37)
Albumin: 4.5 g/dL (ref 3.5–5.2)
Alkaline Phosphatase: 77 U/L (ref 39–117)
BUN: 19 mg/dL (ref 6–23)
CO2: 30 meq/L (ref 19–32)
Calcium: 9.7 mg/dL (ref 8.4–10.5)
Chloride: 100 meq/L (ref 96–112)
Creatinine, Ser: 0.84 mg/dL (ref 0.40–1.20)
GFR: 69.85 mL/min (ref >60.00)
Glucose, Bld: 91 mg/dL (ref 70–99)
Potassium: 4.8 meq/L (ref 3.5–5.1)
Sodium: 139 meq/L (ref 135–145)
Total Bilirubin: 0.6 mg/dL (ref 0.2–1.2)
Total Protein: 7.3 g/dL (ref 6.0–8.3)

## 2024-01-05 LAB — CBC WITH DIFFERENTIAL/PLATELET
Basophils Absolute: 0 10*3/uL (ref 0.0–0.1)
Basophils Relative: 0.8 % (ref 0.0–3.0)
Eosinophils Absolute: 0.1 10*3/uL (ref 0.0–0.7)
Eosinophils Relative: 2.3 % (ref 0.0–5.0)
HCT: 41.5 % (ref 36.0–46.0)
Hemoglobin: 14 g/dL (ref 12.0–15.0)
Lymphocytes Relative: 24.3 % (ref 12.0–46.0)
Lymphs Abs: 1.2 10*3/uL (ref 0.7–4.0)
MCHC: 33.6 g/dL (ref 30.0–36.0)
MCV: 92.3 fL (ref 78.0–100.0)
Monocytes Absolute: 0.3 10*3/uL (ref 0.1–1.0)
Monocytes Relative: 6.9 % (ref 3.0–12.0)
Neutro Abs: 3.3 10*3/uL (ref 1.4–7.7)
Neutrophils Relative %: 65.7 % (ref 43.0–77.0)
Platelets: 201 10*3/uL (ref 150.0–400.0)
RBC: 4.5 Mil/uL (ref 3.87–5.11)
RDW: 13.8 % (ref 11.5–15.5)
WBC: 5 10*3/uL (ref 4.0–10.5)

## 2024-01-05 LAB — VITAMIN D 25 HYDROXY (VIT D DEFICIENCY, FRACTURES): VITD: 63 ng/mL (ref 30.00–100.00)

## 2024-01-05 LAB — TSH: TSH: 0.99 u[IU]/mL (ref 0.35–5.50)

## 2024-01-05 NOTE — Patient Instructions (Addendum)
 Blood work was ordered.       Medications changes include :   None    Return in about 1 year (around 01/04/2025) for Physical Exam.   Health Maintenance, Female Adopting a healthy lifestyle and getting preventive care are important in promoting health and wellness. Ask your health care provider about: The right schedule for you to have regular tests and exams. Things you can do on your own to prevent diseases and keep yourself healthy. What should I know about diet, weight, and exercise? Eat a healthy diet  Eat a diet that includes plenty of vegetables, fruits, low-fat dairy products, and lean protein. Do not eat a lot of foods that are high in solid fats, added sugars, or sodium. Maintain a healthy weight Body mass index (BMI) is used to identify weight problems. It estimates body fat based on height and weight. Your health care provider can help determine your BMI and help you achieve or maintain a healthy weight. Get regular exercise Get regular exercise. This is one of the most important things you can do for your health. Most adults should: Exercise for at least 150 minutes each week. The exercise should increase your heart rate and make you sweat (moderate-intensity exercise). Do strengthening exercises at least twice a week. This is in addition to the moderate-intensity exercise. Spend less time sitting. Even light physical activity can be beneficial. Watch cholesterol and blood lipids Have your blood tested for lipids and cholesterol at 72 years of age, then have this test every 5 years. Have your cholesterol levels checked more often if: Your lipid or cholesterol levels are high. You are older than 72 years of age. You are at high risk for heart disease. What should I know about cancer screening? Depending on your health history and family history, you may need to have cancer screening at various ages. This may include screening for: Breast cancer. Cervical  cancer. Colorectal cancer. Skin cancer. Lung cancer. What should I know about heart disease, diabetes, and high blood pressure? Blood pressure and heart disease High blood pressure causes heart disease and increases the risk of stroke. This is more likely to develop in people who have high blood pressure readings or are overweight. Have your blood pressure checked: Every 3-5 years if you are 64-75 years of age. Every year if you are 32 years old or older. Diabetes Have regular diabetes screenings. This checks your fasting blood sugar level. Have the screening done: Once every three years after age 76 if you are at a normal weight and have a low risk for diabetes. More often and at a younger age if you are overweight or have a high risk for diabetes. What should I know about preventing infection? Hepatitis B If you have a higher risk for hepatitis B, you should be screened for this virus. Talk with your health care provider to find out if you are at risk for hepatitis B infection. Hepatitis C Testing is recommended for: Everyone born from [birth year] through 1964. Anyone with known risk factors for hepatitis C. Sexually transmitted infections (STIs) Get screened for STIs, including gonorrhea and chlamydia, if: You are sexually active and are younger than 72 years of age. You are older than 72 years of age and your health care provider tells you that you are at risk for this type of infection. Your sexual activity has changed since you were last screened, and you are at increased risk for chlamydia or gonorrhea. Ask  your health care provider if you are at risk. Ask your health care provider about whether you are at high risk for HIV. Your health care provider may recommend a prescription medicine to help prevent HIV infection. If you choose to take medicine to prevent HIV, you should first get tested for HIV. You should then be tested every 3 months for as long as you are taking the  medicine. Pregnancy If you are about to stop having your period (premenopausal) and you may become pregnant, seek counseling before you get pregnant. Take 400 to 800 micrograms (mcg) of folic acid every day if you become pregnant. Ask for birth control (contraception) if you want to prevent pregnancy. Osteoporosis and menopause Osteoporosis is a disease in which the bones lose minerals and strength with aging. This can result in bone fractures. If you are 61 years old or older, or if you are at risk for osteoporosis and fractures, ask your health care provider if you should: Be screened for bone loss. Take a calcium or vitamin D supplement to lower your risk of fractures. Be given hormone replacement therapy (HRT) to treat symptoms of menopause. Follow these instructions at home: Alcohol use Do not drink alcohol if: Your health care provider tells you not to drink. You are pregnant, may be pregnant, or are planning to become pregnant. If you drink alcohol: Limit how much you have to: 0-1 drink a day. Know how much alcohol is in your drink. In the [place] one drink equals one 12 oz bottle of beer (355 mL), one 5 oz glass of wine (148 mL), or one 1 oz glass of hard liquor (44 mL). Lifestyle Do not use any products that contain nicotine or tobacco. These products include cigarettes, chewing tobacco, and vaping devices, such as e-cigarettes. If you need help quitting, ask your health care provider. Do not use street drugs. Do not share needles. Ask your health care provider for help if you need support or information about quitting drugs. General instructions Schedule regular health, dental, and eye exams. Stay current with your vaccines. Tell your health care provider if: You often feel depressed. You have ever been abused or do not feel safe at home. Summary Adopting a healthy lifestyle and getting preventive care are important in promoting health and wellness. Follow your health care  provider's instructions about healthy diet, exercising, and getting tested or screened for diseases. Follow your health care provider's instructions on monitoring your cholesterol and blood pressure. This information is not intended to replace advice given to you by your health care provider. Make sure you discuss any questions you have with your health care provider. Document Revised: 03/19/2021 Document Reviewed: 03/19/2021 Elsevier Patient Education  2024 [employer].

## 2024-01-05 NOTE — Assessment & Plan Note (Signed)
 Chronic Taking vitamin D daily Check vitamin D level

## 2024-01-05 NOTE — Progress Notes (Signed)
 Subjective:    Patient ID: Danielle Pratt, female    DOB: 1951-06-08, 73 y.o.   MRN: [id number]      HPI Danielle Pratt is here for a Physical exam and her chronic medical problems.   Overall doing well.   Medications and allergies reviewed with patient and updated if appropriate.  Current Outpatient Medications on File Prior to Visit  Medication Sig Dispense Refill   apixaban (ELIQUIS) 2.5 MG TABS tablet Take 1 tablet (2.5 mg total) by mouth 2 (two) times daily. 180 tablet 3   Cholecalciferol (VITAMIN D3) 50 MCG (2000 UT) capsule Take 1 capsule by mouth daily.     clobetasol cream (TEMOVATE) 0.05 % Apply 1 application topically at bedtime as needed (for skin irritation/finger).      Cyanocobalamin (VITAMIN B-12 PO) Take 2,500 mcg by mouth daily.     levothyroxine (SYNTHROID) 100 MCG tablet TAKE ONE TABLET BY MOUTH DAILY 90 tablet 3   METAMUCIL FIBER PO Take 3-4 capsules by mouth daily as needed.     pilocarpine (SALAGEN) 5 MG tablet Take 5 mg by mouth See admin instructions. Take 1 tablet (5 mg) by mouth 3 times daily - early morning, midday and bedtime, may take an additional tablet during the day as needed for dry mouth.     tretinoin (RETIN-A) 0.05 % cream Apply 1 application topically at bedtime.     venlafaxine XR (EFFEXOR-XR) 75 MG 24 hr capsule TAKE 1 CAPSULE BY MOUTH EVERY NIGHT AT BEDTIME 90 capsule 3   rosuvastatin (CRESTOR) 10 MG tablet Take 1 tablet (10 mg total) by mouth daily. 90 tablet 3   No current facility-administered medications on file prior to visit.    Review of Systems  Constitutional:  Negative for fever.  Eyes:  Negative for visual disturbance.  Respiratory:  Negative for cough, shortness of breath and wheezing.   Cardiovascular:  Negative for chest pain, palpitations and leg swelling.  Gastrointestinal:  Negative for abdominal pain, blood in stool, constipation and diarrhea.       No gerd  Genitourinary:  Negative for dysuria.  Musculoskeletal:   Positive for arthralgias (right hip). Negative for back pain.  Skin:  Negative for rash.  Neurological:  Negative for light-headedness and headaches.  Psychiatric/Behavioral:  Positive for dysphoric mood. Negative for sleep disturbance. The patient is nervous/anxious.        Objective:   Vitals:   01/05/24 1040  BP: 118/78  Pulse: 65  Temp: 98 F (36.7 C)  SpO2: 95%   Filed Weights   01/05/24 1040  Weight: 148 lb (67.1 kg)   Body mass index is 21.86 kg/m.  BP Readings from Last 3 Encounters:  01/05/24 118/78  03/12/23 127/84  12/18/22 118/76    Wt Readings from Last 3 Encounters:  01/05/24 148 lb (67.1 kg)  05/05/23 152 lb (68.9 kg)  03/12/23 152 lb 9.6 oz (69.2 kg)       Physical Exam Constitutional: She appears well-developed and well-nourished. No distress.  HENT:  Head: Normocephalic and atraumatic.  Right Ear: External ear normal. Normal ear canal and TM Left Ear: External ear normal.  Normal ear canal and TM Mouth/Throat: Oropharynx is clear and moist.  Eyes: Conjunctivae normal.  Neck: Neck supple. No tracheal deviation present. No thyromegaly present.  No carotid bruit  Cardiovascular: Normal rate, regular rhythm and normal heart sounds.   No murmur heard.  No edema. Pulmonary/Chest: Effort normal and breath sounds normal. No respiratory distress. She has  no wheezes. She has no rales.  Breast: deferred   Abdominal: Soft. She exhibits no distension. There is no tenderness.  Lymphadenopathy: She has no cervical adenopathy.  Skin: Skin is warm and dry. She is not diaphoretic.  Psychiatric: She has a normal mood and affect. Her behavior is normal.     Lab Results  Component Value Date   WBC 6.8 12/18/2022   HGB 14.3 12/18/2022   HCT 42.5 12/18/2022   PLT 267.0 12/18/2022   GLUCOSE 89 12/18/2022   CHOL 161 07/04/2023   TRIG 87 07/04/2023   HDL 68 07/04/2023   LDLDIRECT 161.1 06/09/2013   LDLCALC 77 07/04/2023   ALT 15 07/04/2023   AST 22  07/04/2023   NA 139 12/18/2022   K 4.4 12/18/2022   CL 100 12/18/2022   CREATININE 0.96 12/18/2022   BUN 15 12/18/2022   CO2 31 12/18/2022   TSH 3.76 12/18/2022   INR 1.15 02/25/2018   HGBA1C 5.6 02/01/2016         Assessment & Plan:   Physical exam: Screening blood work  ordered Exercise  none-stressed regular exercise.  She has thought about getting a Systems analyst and I think that would be a perfect the way to start regular exercise Weight  normal Substance abuse  none   Reviewed recommended immunizations.   Health Maintenance  Topic Date Due   COVID-19 Vaccine (7 - 2024-25 season) 01/21/2024 (Originally 11/05/2023)   Medicare Annual Wellness (AWV)  05/04/2024   DEXA SCAN  06/13/2024   MAMMOGRAM  07/14/2024   Colonoscopy  01/10/2026   DTaP/Tdap/Td (4 - Td or Tdap) 11/11/2028   Pneumonia Vaccine 22+ Years old  Completed   INFLUENZA VACCINE  Completed   Hepatitis C Screening  Completed   Zoster Vaccines- Shingrix  Completed   HPV VACCINES  Aged Out          See Problem List for Assessment and Plan of chronic medical problems.

## 2024-01-05 NOTE — Assessment & Plan Note (Signed)
Chronic  Clinically euthyroid Check tsh and will titrate med dose if needed Currently taking levothyroxine 100 mcg daily

## 2024-01-05 NOTE — Assessment & Plan Note (Signed)
Chronic Controlled, stable Continue effexor 75 mg daily  

## 2024-01-05 NOTE — Assessment & Plan Note (Signed)
 Chronic Regular exercise and healthy diet encouraged-not currently exercising regularly but encouraged her to start Check lipid panel , cmp, tsh Continue Crestor 10 mg daily

## 2024-01-05 NOTE — Assessment & Plan Note (Addendum)
 Chronic Does take one calcium pill daily -encouraged high calcium diet Taking vitamin D daily Check vitamin D level Stressed regular exercise DEXA due later this year

## 2024-01-07 DIAGNOSIS — Z01419 Encounter for gynecological examination (general) (routine) without abnormal findings: Secondary | ICD-10-CM | POA: Diagnosis not present

## 2024-01-07 DIAGNOSIS — Z1272 Encounter for screening for malignant neoplasm of vagina: Secondary | ICD-10-CM | POA: Diagnosis not present

## 2024-01-07 DIAGNOSIS — Z6822 Body mass index (BMI) 22.0-22.9, adult: Secondary | ICD-10-CM | POA: Diagnosis not present

## 2024-01-08 ENCOUNTER — Encounter: Payer: Self-pay | Admitting: Internal Medicine

## 2024-01-17 ENCOUNTER — Other Ambulatory Visit: Payer: Self-pay | Admitting: Internal Medicine

## 2024-01-20 DIAGNOSIS — C44619 Basal cell carcinoma of skin of left upper limb, including shoulder: Secondary | ICD-10-CM | POA: Diagnosis not present

## 2024-01-20 DIAGNOSIS — D485 Neoplasm of uncertain behavior of skin: Secondary | ICD-10-CM | POA: Diagnosis not present

## 2024-01-20 DIAGNOSIS — L821 Other seborrheic keratosis: Secondary | ICD-10-CM | POA: Diagnosis not present

## 2024-01-26 ENCOUNTER — Other Ambulatory Visit: Payer: Self-pay | Admitting: Internal Medicine

## 2024-02-09 DIAGNOSIS — Z96641 Presence of right artificial hip joint: Secondary | ICD-10-CM | POA: Diagnosis not present

## 2024-02-16 DIAGNOSIS — H25011 Cortical age-related cataract, right eye: Secondary | ICD-10-CM | POA: Diagnosis not present

## 2024-02-16 DIAGNOSIS — H52209 Unspecified astigmatism, unspecified eye: Secondary | ICD-10-CM | POA: Diagnosis not present

## 2024-02-16 DIAGNOSIS — H25041 Posterior subcapsular polar age-related cataract, right eye: Secondary | ICD-10-CM | POA: Diagnosis not present

## 2024-02-16 DIAGNOSIS — H2511 Age-related nuclear cataract, right eye: Secondary | ICD-10-CM | POA: Diagnosis not present

## 2024-03-08 ENCOUNTER — Encounter: Payer: Self-pay | Admitting: Cardiovascular Disease

## 2024-03-08 ENCOUNTER — Ambulatory Visit: Payer: PPO | Attending: Cardiovascular Disease | Admitting: Cardiovascular Disease

## 2024-03-08 VITALS — BP 128/82 | HR 85 | Ht 69.0 in | Wt 150.0 lb

## 2024-03-08 DIAGNOSIS — I2782 Chronic pulmonary embolism: Secondary | ICD-10-CM

## 2024-03-08 DIAGNOSIS — I2721 Secondary pulmonary arterial hypertension: Secondary | ICD-10-CM

## 2024-03-08 DIAGNOSIS — E782 Mixed hyperlipidemia: Secondary | ICD-10-CM

## 2024-03-08 NOTE — Progress Notes (Signed)
 03/08/2024 Danielle Pratt   04-18-1951  [id number]  Primary Physician Donnette Gal, Beckey Bourgeois, MD Primary Cardiologist: Avanell Leigh MD Lathan Poke, [place], [place]  HPI:  Danielle Pratt is a 73 y.o.    thin and fit-appearing married Caucasian female mother of 4, grandmother and 2 grandchildren who I last saw in the office 03/12/2023.  He was referred by Dr. Arleta Bench , hematology oncology, for evaluation and treatment of chronic pulmonary thromboembolic disease.she has no cardiac risk factors. She had her first episode of pulmonary emboli back in 1994 and her second episode March 2017 that it occurred in association with decreased mobility due to a brace applied for dislocation of an implanted hip. She was on oral anticoagulation for 6 months after that. She did have tibial DVT as well. She had recurrent shortness of breath and recent admission in January demonstrating increased pulmonary embolic load although her RV LV ratio did not warrant local thrombolytic therapy. She is on chronic O2 to keep recess greater than 90%. Should she has mild limitations regarding dyspnea. 2-D echo revealed normal LV systolic function, mildly dilated and hypokinetic right ventricle but her pulmonary pressures were not elevated. She is on Eliquis  at this time. Venous Dopplers were negative. She apparently has undergone a thrombophilia workup as well.   I referred her to Dr. Julane Ny who did a right left heart cath revealing normal coronary arteries.  When he put a pigtail catheter in her LV she developed complete heart block which ultimately resolved spontaneously.  He did refer her to Dr. Stefan Edge at [facility] who did a pulmonary embolectomy on her 6/19 resulting in marked improvement in her clinical status.  She no longer is on oxygen  feet.  She denies chest pain or shortness of breath.  Her last 2D echo performed 04/06/2020 revealed normal LV systolic function and normal RV systolic function.   Since I saw her in the office 1  year ago she has remained stable.  She is off oxygen .  She is active and asymptomatic.   Current Meds  Medication Sig   Cholecalciferol  (VITAMIN D3) 50 MCG (2000 UT) capsule Take 1 capsule by mouth daily.   clobetasol  cream (TEMOVATE ) 0.05 % Apply 1 application topically at bedtime as needed (for skin irritation/finger).    Cyanocobalamin (VITAMIN B-12 PO) Take 2,500 mcg by mouth daily.   ELIQUIS  2.5 MG TABS tablet TAKE 1 TABLET BY MOUTH TWICE A DAY   levothyroxine  (SYNTHROID ) 100 MCG tablet TAKE 1 TABLET BY MOUTH DAILY   METAMUCIL FIBER PO Take 3-4 capsules by mouth daily as needed.   pilocarpine  (SALAGEN ) 5 MG tablet Take 5 mg by mouth See admin instructions. Take 1 tablet (5 mg) by mouth 3 times daily - early morning, midday and bedtime, may take an additional tablet during the day as needed for dry mouth.   tretinoin (RETIN-A) 0.05 % cream Apply 1 application topically at bedtime.   venlafaxine  XR (EFFEXOR -XR) 75 MG 24 hr capsule TAKE 1 CAPSULE BY MOUTH EVERY NIGHT AT BEDTIME     Allergies  Allergen Reactions   Omnicef  [Cefdinir ]     Facial swelling    Social History   Socioeconomic History   Marital status: Married    Spouse name: Not on file   Number of children: Not on file   Years of education: Not on file   Highest education level: Not on file  Occupational History   Not on file  Tobacco Use   Smoking  status: Never   Smokeless tobacco: Never  Vaping Use   Vaping status: Never Used  Substance and Sexual Activity   Alcohol use: Yes    Alcohol/week: 3.0 standard drinks of alcohol    Types: 3 Cans of beer per week   Drug use: No   Sexual activity: Yes  Other Topics Concern   Not on file  Social History Narrative   Not on file   Social Drivers of Health   Financial Resource Strain: Low Risk  (05/05/2023)   Overall Financial Resource Strain (CARDIA)    Difficulty of Paying Living Expenses: Not hard at all  Food Insecurity: No Food Insecurity (05/05/2023)    Hunger Vital Sign    Worried About Running Out of Food in the Last Year: Never true    Ran Out of Food in the Last Year: Never true  Transportation Needs: No Transportation Needs (05/05/2023)   PRAPARE - Administrator, Civil Service (Medical): No    Lack of Transportation (Non-Medical): No  Physical Activity: Inactive (05/05/2023)   Exercise Vital Sign    Days of Exercise per Week: 0 days    Minutes of Exercise per Session: 0 min  Stress: No Stress Concern Present (05/05/2023)   [employer] of Occupational Health - Occupational Stress Questionnaire    Feeling of Stress : Not at all  Social Connections: Moderately Integrated (05/05/2023)   Social Connection and Isolation Panel [NHANES]    Frequency of Communication with Friends and Family: Three times a week    Frequency of Social Gatherings with Friends and Family: Three times a week    Attends Religious Services: 1 to 4 times per year    Active Member of Clubs or Organizations: No    Attends Banker Meetings: Never    Marital Status: Married  Catering manager Violence: Not At Risk (05/05/2023)   Humiliation, Afraid, Rape, and Kick questionnaire    Fear of Current or Ex-Partner: No    Emotionally Abused: No    Physically Abused: No    Sexually Abused: No     Review of Systems: General: negative for chills, fever, night sweats or weight changes.  Cardiovascular: negative for chest pain, dyspnea on exertion, edema, orthopnea, palpitations, paroxysmal nocturnal dyspnea or shortness of breath Dermatological: negative for rash Respiratory: negative for cough or wheezing Urologic: negative for hematuria Abdominal: negative for nausea, vomiting, diarrhea, bright red blood per rectum, melena, or hematemesis Neurologic: negative for visual changes, syncope, or dizziness All other systems reviewed and are otherwise negative except as noted above.    Blood pressure 128/82, pulse 85, height 5\' 9"  (1.753 m),  weight 150 lb (68 kg), SpO2 95%.  General appearance: alert and no distress Neck: no adenopathy, no carotid bruit, no JVD, supple, symmetrical, trachea midline, and thyroid  not enlarged, symmetric, no tenderness/mass/nodules Lungs: clear to auscultation bilaterally Heart: regular rate and rhythm, S1, S2 normal, no murmur, click, rub or gallop Extremities: extremities normal, atraumatic, no cyanosis or edema Pulses: 2+ and symmetric Skin: Skin color, texture, turgor normal. No rashes or lesions Neurologic: Grossly normal  EKG EKG Interpretation Date/Time:  Monday March 08 2024 14:34:36 EDT Ventricular Rate:  87 PR Interval:  164 QRS Duration:  132 QT Interval:  398 QTC Calculation: 478 R Axis:   93  Text Interpretation: Sinus rhythm with Premature supraventricular complexes Biatrial enlargement Left ventricular hypertrophy with QRS widening and repolarization abnormality ( Cornell product ) Anterolateral infarct (cited on or before 08-Mar-2024) When  compared with ECG of 28-Mar-2020 10:23, Premature supraventricular complexes are now Present QRS axis Shifted right Serial changes of Anterior infarct Present Confirmed by Lauro Portal [phone]) on 03/08/2024 2:35:17 PM    ASSESSMENT AND PLAN:   Pulmonary emboli (HCC) History of chronic thromboembolic pulmonary disease.  She had pulmonary embolectomy 6/19 at [facility] by Dr. Stefan Edge which resulted in marked improvement in her symptoms and her echo.  She is currently asymptomatic on Eliquis .  Hyperlipidemia, mixed History of hyperlipidemia on rosuvastatin  with lipid profile performed 01/01/2024 revealing total cholesterol 153, LDL 70 and HDL 72.  LEFT BUNDLE BRANCH BLOCK Chronic     Avanell Leigh MD [place], [facility] 03/08/2024 2:44 PM

## 2024-03-08 NOTE — Patient Instructions (Signed)

## 2024-03-08 NOTE — Assessment & Plan Note (Signed)
 Chronic.

## 2024-03-08 NOTE — Assessment & Plan Note (Addendum)
 History of chronic thromboembolic pulmonary disease.  She had pulmonary embolectomy 6/19 at [facility] by Dr. Stefan Edge which resulted in marked improvement in her symptoms and her echo.  She is currently asymptomatic on Eliquis .

## 2024-03-08 NOTE — Assessment & Plan Note (Signed)
 History of hyperlipidemia on rosuvastatin  with lipid profile performed 01/01/2024 revealing total cholesterol 153, LDL 70 and HDL 72.

## 2024-03-17 NOTE — Progress Notes (Unsigned)
    Subjective:    Patient ID: Danielle Pratt, female    DOB: 06-Jul-1951, 73 y.o.   MRN: [id number]      HPI Danielle Pratt is here for No chief complaint on file.   Anxiety: She is current only on Effexor  75 mg daily     Medications and allergies reviewed with patient and updated if appropriate.  Current Outpatient Medications on File Prior to Visit  Medication Sig Dispense Refill   Cholecalciferol  (VITAMIN D3) 50 MCG (2000 UT) capsule Take 1 capsule by mouth daily.     clobetasol  cream (TEMOVATE ) 0.05 % Apply 1 application topically at bedtime as needed (for skin irritation/finger).      Cyanocobalamin (VITAMIN B-12 PO) Take 2,500 mcg by mouth daily.     ELIQUIS  2.5 MG TABS tablet TAKE 1 TABLET BY MOUTH TWICE A DAY 180 tablet 3   levothyroxine  (SYNTHROID ) 100 MCG tablet TAKE 1 TABLET BY MOUTH DAILY 90 tablet 3   METAMUCIL FIBER PO Take 3-4 capsules by mouth daily as needed.     pilocarpine  (SALAGEN ) 5 MG tablet Take 5 mg by mouth See admin instructions. Take 1 tablet (5 mg) by mouth 3 times daily - early morning, midday and bedtime, may take an additional tablet during the day as needed for dry mouth.     rosuvastatin  (CRESTOR ) 10 MG tablet Take 1 tablet (10 mg total) by mouth daily. 90 tablet 3   tretinoin (RETIN-A) 0.05 % cream Apply 1 application topically at bedtime.     venlafaxine  XR (EFFEXOR -XR) 75 MG 24 hr capsule TAKE 1 CAPSULE BY MOUTH EVERY NIGHT AT BEDTIME 90 capsule 3   No current facility-administered medications on file prior to visit.    Review of Systems     Objective:  There were no vitals filed for this visit. BP Readings from Last 3 Encounters:  03/08/24 128/82  01/05/24 118/78  03/12/23 127/84   Wt Readings from Last 3 Encounters:  03/08/24 150 lb (68 kg)  01/05/24 148 lb (67.1 kg)  05/05/23 152 lb (68.9 kg)   There is no height or weight on file to calculate BMI.    Physical Exam         Assessment & Plan:    See Problem List for  Assessment and Plan of chronic medical problems.

## 2024-03-18 ENCOUNTER — Ambulatory Visit (INDEPENDENT_AMBULATORY_CARE_PROVIDER_SITE_OTHER): Admitting: Internal Medicine

## 2024-03-18 ENCOUNTER — Encounter: Payer: Self-pay | Admitting: Internal Medicine

## 2024-03-18 VITALS — BP 132/78 | HR 76 | Temp 98.0°F | Ht 69.0 in | Wt 148.0 lb

## 2024-03-18 DIAGNOSIS — F419 Anxiety disorder, unspecified: Secondary | ICD-10-CM

## 2024-03-18 MED ORDER — CLONAZEPAM 0.5 MG PO TABS: 0.2500 mg | ORAL_TABLET | Freq: Two times a day (BID) | ORAL | 1 refills | Status: AC | PRN

## 2024-03-18 NOTE — Assessment & Plan Note (Signed)
 Chronic Not ideally controlled Also experiencing some depression Discussed options about increasing her Effexor  or adding something to which she is already taking Primarily which she is experiencing is anxiety and she would rather not increase the Effexor  Start clonazepam 0.25-0.5 mg twice daily as needed Continue Effexor  75 mg daily If this is not effective she will let me know Discussed possible side effects and concerns regarding the clonazepam-we both feel this will likely be short-term until she is able to get through this time.

## 2024-03-18 NOTE — Patient Instructions (Addendum)
        Medications changes include :   clonazepam 0.25-0.5 mg twice a day as needed

## 2024-04-02 ENCOUNTER — Other Ambulatory Visit: Payer: Self-pay

## 2024-04-02 MED ORDER — ROSUVASTATIN CALCIUM 10 MG PO TABS: 10.0000 mg | ORAL_TABLET | Freq: Every day | ORAL | 3 refills | Status: AC

## 2024-05-10 ENCOUNTER — Ambulatory Visit (INDEPENDENT_AMBULATORY_CARE_PROVIDER_SITE_OTHER): Payer: PPO

## 2024-05-10 VITALS — Ht 69.0 in | Wt 148.0 lb

## 2024-05-10 DIAGNOSIS — Z0000 Encounter for general adult medical examination without abnormal findings: Secondary | ICD-10-CM

## 2024-05-10 NOTE — Patient Instructions (Addendum)
 Danielle Pratt , Thank you for taking time out of your busy schedule to complete your Annual Wellness Visit with me. I enjoyed our conversation and look forward to speaking with you again next year. I, as well as your care team,  appreciate your ongoing commitment to your health goals. Please review the following plan we discussed and let me know if I can assist you in the future. Your Game plan/ To Do List    Follow up Visits: Next Medicare AWV with our clinical staff: 05/11/2025.   Have you seen your provider in the last 6 months (3 months if uncontrolled diabetes)? Yes Next Office Visit with your provider: Patient stated that she will call the office at the beginning of the year to schedule appointment with Dr. Geofm.  Her last appointment was 03/18/2024.  Clinician Recommendations:  Aim for 30 minutes of exercise or brisk walking, 6-8 glasses of water, and 5 servings of fruits and vegetables each day. Keep up the good work.  Remember to call and get scheduled for you mammogram and bone density screenings by August and or September.      This is a list of the screening recommended for you and due dates:  Health Maintenance  Topic Date Due   COVID-19 Vaccine (7 - 2024-25 season) 11/05/2023   Medicare Annual Wellness Visit  05/04/2024   Flu Shot  06/11/2024   DEXA scan (bone density measurement)  06/13/2024   Mammogram  07/14/2024   Colon Cancer Screening  01/10/2026   DTaP/Tdap/Td vaccine (4 - Td or Tdap) 11/11/2028   Pneumococcal Vaccine for age over 51  Completed   Hepatitis C Screening  Completed   Zoster (Shingles) Vaccine  Completed   Hepatitis B Vaccine  Aged Out   HPV Vaccine  Aged Out   Meningitis B Vaccine  Aged Out    Advanced directives: (Copy Requested) Please bring a copy of your health care power of attorney and living will to the office to be added to your chart at your convenience. You can mail to [facility] [address] [place], [place] [address] or email  to [email or web address] Advance Care Planning is important because it:  [x]  Makes sure you receive the medical care that is consistent with your values, goals, and preferences  [x]  It provides guidance to your family and loved ones and reduces their decisional burden about whether or not they are making the right decisions based on your wishes.  Follow the link provided in your after visit summary or read over the paperwork we have mailed to you to help you started getting your Advance Directives in place. If you need assistance in completing these, please reach out to us  so that we can help you!  See attachments for Preventive Care and Fall Prevention Tips.

## 2024-05-10 NOTE — Progress Notes (Signed)
 Subjective:   Danielle Pratt is a 73 y.o. who presents for a Medicare Wellness preventive visit.  As a reminder, Annual Wellness Visits don't include a physical exam, and some assessments may be limited, especially if this visit is performed virtually. We may recommend an in-person follow-up visit with your provider if needed.  Visit Complete: Virtual I connected with  Danielle Pratt on 05/10/24 by a audio enabled telemedicine application and verified that I am speaking with the correct person using two identifiers.  Patient Location: Home  Provider Location: Office/Clinic  I discussed the limitations of evaluation and management by telemedicine. The patient expressed understanding and agreed to proceed.  Vital Signs: Because this visit was a virtual/telehealth visit, some criteria may be missing or patient reported. Any vitals not documented were not able to be obtained and vitals that have been documented are patient reported.  VideoDeclined- This patient declined Librarian, academic. Therefore the visit was completed with audio only.  Persons Participating in Visit: Patient.  AWV Questionnaire: Yes: Patient Medicare AWV questionnaire was completed by the patient on 05/07/2024; I have confirmed that all information answered by patient is correct and no changes since this date.  Cardiac Risk Factors include: advanced age (>47men, >93 women);Other (see comment), Risk factor comments: PAH,     Objective:    Today's Vitals   05/10/24 1432  Weight: 148 lb (67.1 kg)  Height: 5' 9 (1.753 m)   Body mass index is 21.86 kg/m.     05/10/2024    3:03 PM 05/05/2023    2:03 PM 04/26/2022    8:59 AM 02/28/2022    3:22 PM 11/16/2018    2:42 PM 10/22/2018    9:08 AM 03/03/2018    1:00 AM  Advanced Directives  Does Patient Have a Medical Advance Directive? Yes Yes Yes No Yes  Yes  Yes   Type of Estate agent of [place];Living will  Healthcare Power of [place];Living will Healthcare Power of [place];Living will  Healthcare Power of [employer] of [place];Living will Healthcare Power of Attorney  Does patient want to make changes to medical advance directive?     No - Patient declined   No - Patient declined   Copy of Healthcare Power of Attorney in Chart? No - copy requested No - copy requested No - copy requested  No - copy requested   No - copy requested   Would patient like information on creating a medical advance directive?    No - Patient declined        Data saved with a previous flowsheet row definition    Current Medications (verified) Outpatient Encounter Medications as of 05/10/2024  Medication Sig   Cholecalciferol  (VITAMIN D3) 50 MCG (2000 UT) capsule Take 1 capsule by mouth daily.   clobetasol  cream (TEMOVATE ) 0.05 % Apply 1 application topically at bedtime as needed (for skin irritation/finger).    clonazePAM  (KLONOPIN ) 0.5 MG tablet Take 0.5-1 tablets (0.25-0.5 mg total) by mouth 2 (two) times daily as needed for anxiety.   Cyanocobalamin (VITAMIN B-12 PO) Take 2,500 mcg by mouth daily.   ELIQUIS  2.5 MG TABS tablet TAKE 1 TABLET BY MOUTH TWICE A DAY   levothyroxine  (SYNTHROID ) 100 MCG tablet TAKE 1 TABLET BY MOUTH DAILY   METAMUCIL FIBER PO Take 3-4 capsules by mouth daily as needed.   pilocarpine  (SALAGEN ) 5 MG tablet Take 5 mg by mouth See admin instructions. Take 1 tablet (5 mg) by mouth  3 times daily - early morning, midday and bedtime, may take an additional tablet during the day as needed for dry mouth.   rosuvastatin  (CRESTOR ) 10 MG tablet Take 1 tablet (10 mg total) by mouth daily.   tretinoin (RETIN-A) 0.05 % cream Apply 1 application topically at bedtime.   venlafaxine  XR (EFFEXOR -XR) 75 MG 24 hr capsule TAKE 1 CAPSULE BY MOUTH EVERY NIGHT AT BEDTIME   No facility-administered encounter medications on file as of 05/10/2024.    Allergies (verified) Omnicef  [cefdinir ]    History: Past Medical History:  Diagnosis Date   Anxiety    Arthritis    hands (11/12/2017)   Cataract    extraction surgery   DVT (deep venous thrombosis) (HCC) 01/2016   LLE   Fasting hyperglycemia    110 in 01/2011;131 in 05/2007   Hypothyroidism    Popliteal cyst    Pulmonary embolism (HCC) 2017; 11/11/2017   the one in 2017 was due to LLE DVT; don't why I had the one 11/11/2017   Renal insufficiency    Tonsillar cancer (HCC) 1998   Past Surgical History:  Procedure Laterality Date   CESAREAN SECTION  1984   G 1 P 2   COLONOSCOPY  2003   negative; Dr Abran   LYMPHADENECTOMY Right 1998   neck   PULMONARY EMBOLISM SURGERY     RIGHT HEART CATH N/A 10/22/2018   Procedure: RIGHT HEART CATH;  Surgeon: Cherrie Toribio SAUNDERS, MD;  Location: [facility];  Service: Cardiovascular;  Laterality: N/A;   RIGHT/LEFT HEART CATH AND CORONARY ANGIOGRAPHY N/A 03/02/2018   Procedure: RIGHT/LEFT HEART CATH AND CORONARY ANGIOGRAPHY;  Surgeon: Cherrie Toribio SAUNDERS, MD;  Location: [facility];  Service: Cardiovascular;  Laterality: N/A;   TEMPORARY PACEMAKER N/A 03/02/2018   Procedure: TEMPORARY PACEMAKER;  Surgeon: Cherrie Toribio SAUNDERS, MD;  Location: [facility];  Service: Cardiovascular;  Laterality: N/A;   TONSILLECTOMY AND ADENOIDECTOMY Right 1998   R tonsilar malignancy; subsequent LN dissection negative   TOTAL ABDOMINAL HYSTERECTOMY  1980s   TOTAL ABDOMINAL HYSTERECTOMY W/ BILATERAL SALPINGOOPHORECTOMY [SHX83]; fibroids; Dr. Rosalynn   TOTAL HIP ARTHROPLASTY Bilateral 2007   Dr Hiram, both hips 3 months apart   Family History  Problem Relation Age of Onset   Hyperlipidemia Mother    Hypertension Mother    Ovarian cancer Mother    Aneurysm Mother        ascending & descending aorta   Heart attack Father 24   Leukemia Paternal Grandmother    Lung cancer Paternal Grandfather    Diabetes Neg Hx    Stroke Neg Hx    Colon cancer Neg Hx    Stomach cancer Neg Hx     Rectal cancer Neg Hx    Breast cancer Neg Hx    Social History   Socioeconomic History   Marital status: Married    Spouse name: Guillermina Maya   Number of children: 2   Years of education: Not on file   Highest education level: Some college, no degree  Occupational History   Occupation: RETIRED  Tobacco Use   Smoking status: Never   Smokeless tobacco: Never  Vaping Use   Vaping status: Never Used  Substance and Sexual Activity   Alcohol use: Yes    Alcohol/week: 3.0 standard drinks of alcohol    Types: 3 Cans of beer per week   Drug use: No   Sexual activity: Yes  Other Topics Concern   Not  on file  Social History Narrative   Lives with husband/2025   Social Drivers of Health   Financial Resource Strain: Low Risk  (05/07/2024)   Overall Financial Resource Strain (CARDIA)    Difficulty of Paying Living Expenses: Not hard at all  Food Insecurity: No Food Insecurity (05/07/2024)   Hunger Vital Sign    Worried About Running Out of Food in the Last Year: Never true    Ran Out of Food in the Last Year: Never true  Transportation Needs: No Transportation Needs (05/07/2024)   PRAPARE - Administrator, Civil Service (Medical): No    Lack of Transportation (Non-Medical): No  Physical Activity: Inactive (05/07/2024)   Exercise Vital Sign    Days of Exercise per Week: 0 days    Minutes of Exercise per Session: Not on file  Stress: No Stress Concern Present (05/07/2024)   [employer] of Occupational Health - Occupational Stress Questionnaire    Feeling of Stress: Only a little  Social Connections: Socially Integrated (05/07/2024)   Social Connection and Isolation Panel    Frequency of Communication with Friends and Family: More than three times a week    Frequency of Social Gatherings with Friends and Family: Once a week    Attends Religious Services: More than 4 times per year    Active Member of [employer] or Organizations: Yes    Attends Hospital doctor: More than 4 times per year    Marital Status: Married    Tobacco Counseling Counseling given: Not Answered    Clinical Intake:  Pre-visit preparation completed: Yes  Pain : No/denies pain     BMI - recorded: 21.86 Nutritional Status: BMI of 19-24  Normal Nutritional Risks: None Diabetes: No  Lab Results  Component Value Date   HGBA1C 5.6 02/01/2016     How often do you need to have someone help you when you read instructions, pamphlets, or other written materials from your doctor or pharmacy?: 1 - Never     Information entered by :: Jen Eppinger, RMA   Activities of Daily Living     05/07/2024    1:22 PM  In your present state of health, do you have any difficulty performing the following activities:  Hearing? 0  Vision? 0  Difficulty concentrating or making decisions? 0  Walking or climbing stairs? 0  Dressing or bathing? 0  Doing errands, shopping? 0  Preparing Food and eating ? N  Using the Toilet? N  In the past six months, have you accidently leaked urine? N  Do you have problems with loss of bowel control? N  Managing your Medications? N  Managing your Finances? N  Housekeeping or managing your Housekeeping? N    Patient Care Team: Geofm Glade PARAS, MD as PCP - General (Internal Medicine) Court Dorn PARAS, MD as PCP - Cardiology (Cardiology) Szabat, Toribio BROCKS, [facility] (Inactive) (Pharmacist) [place], Heather, OD (Optometry) Lavonia Lye, MD as Consulting Physician (Ophthalmology)  I have updated your Care Teams any recent Medical Services you may have received from other providers in the past year.     Assessment:   This is a routine wellness examination for [facility].  Hearing/Vision screen Hearing Screening - Comments:: Denies hearing difficulties   Vision Screening - Comments:: Wears eyeglasses/ Dr. BURUNDI   Goals Addressed             This Visit's Progress    My healthcare goals for 2024 is to stay healthy, drink more  water and  start exercising.   On track      Depression Screen     05/10/2024    3:05 PM 03/18/2024    8:44 AM 05/05/2023    2:05 PM 12/18/2022    8:04 AM 04/26/2022    8:59 AM 04/26/2022    8:57 AM 10/15/2021   12:47 PM  PHQ 2/9 Scores  PHQ - 2 Score 0 3 0 0 0 0 1  PHQ- 9 Score 0 5 0 1   3    Fall Risk     05/07/2024    1:22 PM 03/18/2024    8:43 AM 05/05/2023    2:04 PM 12/18/2022    8:04 AM 04/26/2022    8:59 AM  Fall Risk   Falls in the past year? 0 0 0 0 0  Number falls in past yr:  0 0 0 0  Injury with Fall?  0 0 0 0  Risk for fall due to :  No Fall Risks No Fall Risks No Fall Risks   Follow up Falls evaluation completed;Falls prevention discussed Falls evaluation completed Falls prevention discussed Falls evaluation completed Falls evaluation completed;Education provided      Data saved with a previous flowsheet row definition    MEDICARE RISK AT HOME:  Medicare Risk at Home Any stairs in or around the home?: (Patient-Rptd) Yes If so, are there any without handrails?: (Patient-Rptd) No Home free of loose throw rugs in walkways, pet beds, electrical cords, etc?: (Patient-Rptd) Yes Adequate lighting in your home to reduce risk of falls?: (Patient-Rptd) Yes Life alert?: (Patient-Rptd) No Use of a cane, walker or w/c?: (Patient-Rptd) No Grab bars in the bathroom?: (Patient-Rptd) No Shower chair or bench in shower?: (Patient-Rptd) No Elevated toilet seat or a handicapped toilet?: (Patient-Rptd) No  TIMED UP AND GO:  Was the test performed?  No  Cognitive Function: Declined/Normal: No cognitive concerns noted by patient or family. Patient alert, oriented, able to answer questions appropriately and recall recent events. No signs of memory loss or confusion.        05/05/2023    2:11 PM  6CIT Screen  What Year? 0 points  What month? 0 points  What time? 0 points  Count back from 20 0 points  Months in reverse 0 points  Repeat phrase 0 points  Total Score 0 points     Immunizations Immunization History  Administered Date(s) Administered   Fluad Quad(high Dose 65+) 08/13/2022, 09/10/2023   Influenza, High Dose Seasonal PF 09/03/2018, 08/17/2019, 08/25/2020   Influenza, Quadrivalent, Recombinant, Inj, Pf 09/10/2017   Influenza,inj,Quad PF,6+ Mos 09/05/2014, 07/28/2015, 09/05/2016   Influenza-Unspecified 09/05/2014, 07/28/2015, 09/05/2016, 08/11/2017, 09/05/2021   Moderna Covid-19 Vaccine Bivalent Booster 57yrs & up 11/20/2021   Moderna SARS-COV2 Booster Vaccination 06/13/2021   Novavax(Covid-19) Vaccine 09/10/2023   PFIZER(Purple Top)SARS-COV-2 Vaccination 12/02/2019, 12/23/2019, 11/16/2020   Pfizer(Comirnaty )Fall Seasonal Vaccine 12 years and older 09/27/2022   Pneumococcal Conjugate-13 01/26/2020   Pneumococcal Polysaccharide-23 10/15/2021   Respiratory Syncytial Virus Vaccine ,Recomb Aduvanted(Arexvy ) 08/13/2022   Tdap 11/11/2010, 10/12/2017, 11/11/2018   Zoster Recombinant(Shingrix) 07/05/2021, 09/15/2021    Screening Tests Health Maintenance  Topic Date Due   COVID-19 Vaccine (7 - 2024-25 season) 11/05/2023   INFLUENZA VACCINE  06/11/2024   DEXA SCAN  06/13/2024   MAMMOGRAM  07/14/2024   Medicare Annual Wellness (AWV)  05/10/2025   Colonoscopy  01/10/2026   DTaP/Tdap/Td (4 - Td or Tdap) 11/11/2028   Pneumococcal Vaccine: 50+ Years  Completed   Hepatitis C  Screening  Completed   Zoster Vaccines- Shingrix  Completed   Hepatitis B Vaccines  Aged Out   HPV VACCINES  Aged Out   Meningococcal B Vaccine  Aged Out    Health Maintenance  Health Maintenance Due  Topic Date Due   COVID-19 Vaccine (7 - 2024-25 season) 11/05/2023   Health Maintenance Items Addressed: See Nurse Notes at the end of this note  Additional Screening:  Vision Screening: Recommended annual ophthalmology exams for early detection of glaucoma and other disorders of the eye. Would you like a referral to an eye doctor? No    Dental Screening: Recommended annual  dental exams for proper oral hygiene  Community Resource Referral / Chronic Care Management: CRR required this visit?  No   CCM required this visit?  No   Plan:    I have personally reviewed and noted the following in the patient's chart:   Medical and social history Use of alcohol, tobacco or illicit drugs  Current medications and supplements including opioid prescriptions. Patient is not currently taking opioid prescriptions. Functional ability and status Nutritional status Physical activity Advanced directives List of other physicians Hospitalizations, surgeries, and ER visits in previous 12 months Vitals Screenings to include cognitive, depression, and falls Referrals and appointments  In addition, I have reviewed and discussed with patient certain preventive protocols, quality metrics, and best practice recommendations. A written personalized care plan for preventive services as well as general preventive health recommendations were provided to patient.   Maridel Pixler L Natacha Jepsen, CMA   05/10/2024   After Visit Summary: (MyChart) Due to this being a telephonic visit, the after visit summary with patients personalized plan was offered to patient via MyChart   Notes: Nothing significant to report at this time.

## 2024-06-07 ENCOUNTER — Other Ambulatory Visit: Payer: Self-pay | Admitting: Internal Medicine

## 2024-06-07 DIAGNOSIS — Z1231 Encounter for screening mammogram for malignant neoplasm of breast: Secondary | ICD-10-CM

## 2024-07-21 ENCOUNTER — Ambulatory Visit
Admission: RE | Admit: 2024-07-21 | Discharge: 2024-07-21 | Disposition: A | Discharge status: Home / Self Care | Source: Ambulatory Visit | Attending: Internal Medicine | Admitting: Internal Medicine

## 2024-07-21 DIAGNOSIS — Z1231 Encounter for screening mammogram for malignant neoplasm of breast: Secondary | ICD-10-CM

## 2024-09-28 DIAGNOSIS — L720 Epidermal cyst: Secondary | ICD-10-CM | POA: Diagnosis not present

## 2024-09-28 DIAGNOSIS — L821 Other seborrheic keratosis: Secondary | ICD-10-CM | POA: Diagnosis not present

## 2024-09-28 DIAGNOSIS — Z85828 Personal history of other malignant neoplasm of skin: Secondary | ICD-10-CM | POA: Diagnosis not present

## 2024-09-28 DIAGNOSIS — L820 Inflamed seborrheic keratosis: Secondary | ICD-10-CM | POA: Diagnosis not present

## 2024-11-26 ENCOUNTER — Other Ambulatory Visit: Payer: Self-pay | Admitting: Internal Medicine

## 2024-12-09 ENCOUNTER — Other Ambulatory Visit: Payer: Self-pay | Admitting: Internal Medicine

## 2025-03-02 ENCOUNTER — Encounter: Admitting: Internal Medicine

## 2025-05-11 ENCOUNTER — Ambulatory Visit
